# Patient Record
Sex: Male | Born: 1945
Health system: Southern US, Community
[De-identification: ages and names within clinical notes are randomized; demographics above are authoritative.]

## PROBLEM LIST (undated history)

## (undated) DIAGNOSIS — I2510 Atherosclerotic heart disease of native coronary artery without angina pectoris: Secondary | ICD-10-CM

## (undated) DIAGNOSIS — T7840XA Allergy, unspecified, initial encounter: Secondary | ICD-10-CM

## (undated) DIAGNOSIS — I4891 Unspecified atrial fibrillation: Secondary | ICD-10-CM

## (undated) DIAGNOSIS — I509 Heart failure, unspecified: Secondary | ICD-10-CM

## (undated) DIAGNOSIS — I25810 Atherosclerosis of coronary artery bypass graft(s) without angina pectoris: Secondary | ICD-10-CM

## (undated) DIAGNOSIS — K9189 Other postprocedural complications and disorders of digestive system: Secondary | ICD-10-CM

## (undated) DIAGNOSIS — M519 Unspecified thoracic, thoracolumbar and lumbosacral intervertebral disc disorder: Secondary | ICD-10-CM

## (undated) DIAGNOSIS — E785 Hyperlipidemia, unspecified: Secondary | ICD-10-CM

## (undated) DIAGNOSIS — R739 Hyperglycemia, unspecified: Secondary | ICD-10-CM

## (undated) DIAGNOSIS — K4090 Unilateral inguinal hernia, without obstruction or gangrene, not specified as recurrent: Secondary | ICD-10-CM

## (undated) DIAGNOSIS — K3533 Acute appendicitis with perforation and localized peritonitis, with abscess: Secondary | ICD-10-CM

## (undated) DIAGNOSIS — K567 Ileus, unspecified: Secondary | ICD-10-CM

## (undated) DIAGNOSIS — I10 Essential (primary) hypertension: Secondary | ICD-10-CM

## (undated) DIAGNOSIS — I209 Angina pectoris, unspecified: Secondary | ICD-10-CM

## (undated) DIAGNOSIS — I252 Old myocardial infarction: Secondary | ICD-10-CM

## (undated) DIAGNOSIS — I499 Cardiac arrhythmia, unspecified: Secondary | ICD-10-CM

## (undated) DIAGNOSIS — K651 Peritoneal abscess: Secondary | ICD-10-CM

## (undated) HISTORY — DX: Unilateral inguinal hernia, without obstruction or gangrene, not specified as recurrent: K40.90

## (undated) HISTORY — DX: Atherosclerotic heart disease of native coronary artery without angina pectoris: I25.10

## (undated) HISTORY — DX: Hyperglycemia, unspecified: R73.9

## (undated) HISTORY — PX: COLONOSCOPY: SHX174

## (undated) HISTORY — PX: CORONARY ANGIOPLASTY: SHX604

## (undated) HISTORY — PX: TONSILLECTOMY: SUR1361

## (undated) HISTORY — DX: Allergy, unspecified, initial encounter: T78.40XA

---

## 1997-12-07 ENCOUNTER — Observation Stay (HOSPITAL_COMMUNITY): Admission: AD | Admit: 1997-12-07 | Discharge: 1997-12-08 | Payer: Self-pay | Admitting: Cardiology

## 1999-08-08 HISTORY — PX: LUMBAR LAMINECTOMY: SHX95

## 2000-12-03 ENCOUNTER — Encounter: Payer: Self-pay | Admitting: Neurosurgery

## 2000-12-03 ENCOUNTER — Encounter: Admission: RE | Admit: 2000-12-03 | Discharge: 2000-12-03 | Payer: Self-pay | Admitting: Neurosurgery

## 2000-12-04 ENCOUNTER — Inpatient Hospital Stay (HOSPITAL_COMMUNITY): Admission: RE | Admit: 2000-12-04 | Discharge: 2000-12-06 | Payer: Self-pay | Admitting: Neurosurgery

## 2000-12-04 ENCOUNTER — Encounter: Payer: Self-pay | Admitting: Neurosurgery

## 2001-08-26 ENCOUNTER — Encounter: Payer: Self-pay | Admitting: Otolaryngology

## 2001-08-26 ENCOUNTER — Encounter: Admission: RE | Admit: 2001-08-26 | Discharge: 2001-08-26 | Payer: Self-pay | Admitting: Otolaryngology

## 2001-09-09 ENCOUNTER — Encounter: Payer: Self-pay | Admitting: Otolaryngology

## 2001-09-09 ENCOUNTER — Ambulatory Visit (HOSPITAL_COMMUNITY): Admission: RE | Admit: 2001-09-09 | Discharge: 2001-09-09 | Payer: Self-pay | Admitting: Otolaryngology

## 2002-08-07 DIAGNOSIS — I252 Old myocardial infarction: Secondary | ICD-10-CM

## 2002-08-07 HISTORY — DX: Old myocardial infarction: I25.2

## 2002-08-07 HISTORY — PX: CORONARY ARTERY BYPASS GRAFT: SHX141

## 2002-09-25 ENCOUNTER — Encounter: Payer: Self-pay | Admitting: Emergency Medicine

## 2002-09-25 ENCOUNTER — Inpatient Hospital Stay (HOSPITAL_COMMUNITY): Admission: EM | Admit: 2002-09-25 | Discharge: 2002-10-01 | Payer: Self-pay | Admitting: Emergency Medicine

## 2002-09-26 ENCOUNTER — Encounter: Payer: Self-pay | Admitting: Cardiothoracic Surgery

## 2002-09-27 ENCOUNTER — Encounter: Payer: Self-pay | Admitting: Thoracic Surgery (Cardiothoracic Vascular Surgery)

## 2002-09-28 ENCOUNTER — Encounter: Payer: Self-pay | Admitting: Thoracic Surgery (Cardiothoracic Vascular Surgery)

## 2002-09-29 ENCOUNTER — Encounter: Payer: Self-pay | Admitting: Thoracic Surgery (Cardiothoracic Vascular Surgery)

## 2002-10-13 ENCOUNTER — Encounter: Admission: RE | Admit: 2002-10-13 | Discharge: 2002-10-13 | Payer: Self-pay | Admitting: Cardiology

## 2002-10-13 ENCOUNTER — Encounter: Payer: Self-pay | Admitting: Cardiology

## 2002-10-30 ENCOUNTER — Encounter: Admission: RE | Admit: 2002-10-30 | Discharge: 2002-10-30 | Payer: Self-pay | Admitting: Cardiothoracic Surgery

## 2002-10-30 ENCOUNTER — Encounter: Payer: Self-pay | Admitting: Cardiothoracic Surgery

## 2005-03-08 ENCOUNTER — Ambulatory Visit (HOSPITAL_COMMUNITY): Admission: RE | Admit: 2005-03-08 | Discharge: 2005-03-08 | Payer: Self-pay | Admitting: Gastroenterology

## 2005-03-08 ENCOUNTER — Encounter (INDEPENDENT_AMBULATORY_CARE_PROVIDER_SITE_OTHER): Payer: Self-pay | Admitting: *Deleted

## 2009-03-22 ENCOUNTER — Encounter: Payer: Self-pay | Admitting: Family Medicine

## 2009-03-22 ENCOUNTER — Ambulatory Visit: Payer: Self-pay | Admitting: Family Medicine

## 2009-03-22 ENCOUNTER — Observation Stay (HOSPITAL_COMMUNITY): Admission: EM | Admit: 2009-03-22 | Discharge: 2009-03-22 | Payer: Self-pay | Admitting: Emergency Medicine

## 2010-10-31 ENCOUNTER — Other Ambulatory Visit: Payer: Self-pay | Admitting: Gastroenterology

## 2010-11-12 LAB — CBC
MCV: 91.6 fL (ref 78.0–100.0)
Platelets: 162 10*3/uL (ref 150–400)
RBC: 4.35 MIL/uL (ref 4.22–5.81)
WBC: 7.8 10*3/uL (ref 4.0–10.5)

## 2010-11-12 LAB — BASIC METABOLIC PANEL
Calcium: 9.9 mg/dL (ref 8.4–10.5)
GFR calc Af Amer: >60 mL/min (ref >60)
GFR calc non Af Amer: >60 mL/min (ref >60)
Potassium: 3.7 mEq/L (ref 3.5–5.1)
Sodium: 139 mEq/L (ref 135–145)

## 2010-11-12 LAB — POCT CARDIAC MARKERS
CKMB, poc: 1.5 ng/mL (ref 1.0–8.0)
Myoglobin, poc: 155 ng/mL (ref 12–200)
Troponin i, poc: <0.05 ng/mL (ref 0.00–0.09)
Troponin i, poc: <0.05 ng/mL (ref 0.00–0.09)

## 2010-11-12 LAB — CARDIAC PANEL(CRET KIN+CKTOT+MB+TROPI)
Relative Index: INVALID (ref 0.0–2.5)
Total CK: 94 U/L (ref 7–232)
Troponin I: 0.01 ng/mL (ref 0.00–0.06)

## 2010-11-12 LAB — POCT I-STAT, CHEM 8
Chloride: 110 mEq/L (ref 96–112)
Creatinine, Ser: 1.1 mg/dL (ref 0.4–1.5)
HCT: 39 % (ref 39.0–52.0)
Hemoglobin: 13.3 g/dL (ref 13.0–17.0)
Potassium: 3.7 mEq/L (ref 3.5–5.1)
Sodium: 141 mEq/L (ref 135–145)

## 2010-11-12 LAB — TSH: TSH: 2.656 u[IU]/mL (ref 0.350–4.500)

## 2010-11-12 LAB — DIFFERENTIAL
Basophils Relative: 1 % (ref 0–1)
Eosinophils Absolute: 0.3 10*3/uL (ref 0.0–0.7)
Lymphs Abs: 1.9 10*3/uL (ref 0.7–4.0)
Monocytes Relative: 8 % (ref 3–12)
Neutro Abs: 4.9 10*3/uL (ref 1.7–7.7)
Neutrophils Relative %: 63 % (ref 43–77)

## 2010-11-12 LAB — URINALYSIS, ROUTINE W REFLEX MICROSCOPIC
Bilirubin Urine: NEGATIVE
Ketones, ur: NEGATIVE mg/dL
Nitrite: NEGATIVE
Protein, ur: NEGATIVE mg/dL

## 2010-11-12 LAB — LIPID PANEL
Cholesterol: 104 mg/dL (ref 0–200)
HDL: 31 mg/dL — ABNORMAL LOW (ref >39)

## 2010-11-12 LAB — CK TOTAL AND CKMB (NOT AT ARMC): Relative Index: 1.8 (ref 0.0–2.5)

## 2010-12-20 NOTE — Discharge Summary (Signed)
NAME:  Perry [name], Perry [name]              ACCOUNT NO.:  [id number]   MEDICAL RECORD NO.:  [id number]           PATIENT TYPE:   LOCATION:                                 FACILITY:   PHYSICIAN:  Santiago Bumpers. Hensel, M.D.DATE OF BIRTH:  12-22-1945   DATE OF ADMISSION:  03/22/2009  DATE OF DISCHARGE:  03/22/2009                               DISCHARGE SUMMARY   PRIMARY CARE Raela Bohl:  Brett Canales A. Cleta Alberts, MD at [facility].   CARDIOLOGY:  Georga Hacking, MD, [place], [place].   DISCHARGE DIAGNOSES:  1. Atypical chest pain.  2. Coronary artery disease.  3. Suspected transient ischemic attack.   DISCHARGE MEDICATIONS:  1. Aspirin 81 mg daily.  2. Lisinopril 40 mg daily.  3. TriCor 145 mg daily.  4. Crestor 5 mg daily.  5. Toprol-XL 50 mg daily.   CONSULTANTS:  No formal consultants; however, Dr. Donnie Aho was aware of  his hospitalization.   PROCEDURES:  MRA/MRI of brain and carotid arteries, March 22, 2009,  echocardiogram, transthoracic on March 22, 2009.   LABORATORY DATA:  1. Admission CBC essentially was within normal limits.  Admission      basic metabolic panel essentially within normal limits.  Cardiac      enzymes negative x2 point of care and two quantitative labs (total      of 4).  2. Urinalysis essentially within normal limits.  3. Beta natriuretic peptide 64.  4. Lipid panel:  Total cholesterol 104, triglycerides 109, HDL 31, LDL      51, VLDL 22, and cholesterol to HDL ratio is 3.4.  5. TSH 0.454.   BRIEF HOSPITAL COURSE:  Mr. Finigan presented to the hospital after  experiencing diaphoresis and dyspnea following helping the son move into  college. He also experienced left arm numbness and tingling the night  following helping the son move into college.  He was concerned of a  stroke after typing the symptoms into [employer] and he presented to the ED  for evaluation.  He was evaluated for chest pain with an EKG and serial  cardiac enzymes that were normal.  The EKG showed old evidence of change  and multiple PVCs on telemetry, but no new ST elevation.  One EKG did  show bigeminy; however, no acute findings on further EKG.  He also  received a TIA workup with a MRI/MRA of the brain and neck, which  essentially was within normal limits.  Please see Radiology reports for  further details.  Echocardiograph showed an EF of 45% with mild aortic  valvular regurgitation and no embolism noted.  Please see report for  further details.  Of note, the echo showed mild hypokinesis of the  inferior myocardium and EF of 45-50%.  Mr. Goren tolerated his  hospitalization overnight very well with multiple PVCs on telemetry.  Essentially his cardiac and TIA workup were normal.  He was discharged  to home in stable medical condition.   DISCHARGE INSTRUCTIONS:  Given instructions for TIA and coronary artery  disease as well as normal instructions, he was instructed to use a low-  sodium  heart healthy diet as per normal and continue his home meds.   Pending the issues, results will need to be followed up after discharge.  1. LDL 51.  He is on a low-dose of Crestor, perhaps he could be      transitioned to a less potent statin such as simvastatin.  2. Aspirin.  Given his suspicion for TIA and history of coronary      artery disease, perhaps he could be on a higher dose aspirin;      however, we are leaving this to discretion of the primary care      Shanica Castellanos and Cardiology.   FOLLOWUP APPOINTMENTS:  1. Dr. Donnie Aho, Cardiology on March 23, 2009.  2. Dr. Cleta Alberts at [facility] on March 30, 2009, at 3:15 p.m.   DISCHARGE CONDITION:  The patient was discharged to home in stable  medical condition.      Clementeen Graham, MD  Electronically Signed      Santiago Bumpers. Leveda Anna, M.D.  Electronically Signed    EC/MEDQ  D:  03/22/2009  T:  03/23/2009  Job:  [id number]   cc:   Georga Hacking, M.D.  [place] Cleta Alberts, M.D.

## 2010-12-23 NOTE — Op Note (Signed)
NAME:  Perry [name], Perry [name]              ACCOUNT NO.:  [id number]   MEDICAL RECORD NO.:  [id number]          PATIENT TYPE:  AMB   LOCATION:  ENDO                         FACILITY:  [facility]   PHYSICIAN:  Bernette Redbird, M.D.   DATE OF BIRTH:  1946-03-27   DATE OF PROCEDURE:  03/08/2005  DATE OF DISCHARGE:                                 OPERATIVE REPORT   PROCEDURE:  Colonoscopy with biopsy.   INDICATIONS:  Initial colon cancer screening examination in a 65 year old  gentleman without worrisome risk factors or symptoms.   FINDINGS:  Two diminutive polyps removed by cold biopsy technique.   DESCRIPTION OF PROCEDURE:  The nature, purpose and risks of the procedure  had been reviewed with the patient through our open access program and I  reviewed the risks with him immediately prior to the procedure. The patient  had provided written consent and he received sedation with fentanyl 100 mcg  and Versed 10 mg IV prior to and during the course of the procedure without  arrhythmias other than some mild stable bradycardia related presumably to  his Toprol, and without clinical instability or desaturation during the  course of the procedure.   The Olympus adult video colonoscope was readily advanced to the cecum,  turning the patient into the supine position and applying some external  abdominal compression to reach the base of the cecum which was identified by  visualization of the appendiceal orifice. Pullback was then performed.  Although it was difficult to get a nice controlled motion of the scope back  and forth through the cecum and proximal ascending colon, I believe an  adequate exam of all areas in that section of the colon was obtained. As I  pulled back through the colon, the quality of the prep was excellent and it  is felt that all other areas were also well seen.   In the transverse colon, there were two small sessile hyperplastic-appearing  polyps, under 5 mm in diameter, removed by  cold biopsy. No large polyps,  cancer, colitis, vascular malformations or diverticulosis were noted in the  colon and retroflexion in the rectum and reinspection of the rectum were  unremarkable. The patient tolerated the procedure well and there no apparent  complications.   IMPRESSION:  Diminutive colon polyps removed as described above.   PLAN:  Await pathology results.       RB/MEDQ  D:  03/08/2005  T:  03/08/2005  Job:  [id number]   cc:   Brett Canales A. Cleta Alberts, M.D.  [address]  [place]  [place] [address]  Fax: [phone]

## 2010-12-23 NOTE — H&P (Signed)
NAME:  Perry [name], Perry [name]              ACCOUNT NO.:  [id number]   MEDICAL RECORD NO.:  [id number]          PATIENT TYPE:  OBV   LOCATION:  4731                         FACILITY:  [facility]   PHYSICIAN:  Nestor Ramp, MD        DATE OF BIRTH:  [birth year]   DATE OF ADMISSION:  03/21/2009  DATE OF DISCHARGE:  03/22/2009                              HISTORY & PHYSICAL   CHIEF COMPLAINT:  Left arm tingling.   HISTORY OF PRESENT ILLNESS:  The patient reports tingling in left arm at  9 p.m. around bedtime.  It woke the patient out of sleep a couple of  time and stopped at 1 a.m.  No radiation to jaw.  No chest pain as with  his past myocardial infarction.  No nausea.  The patient does report  shortness of breath as documented earlier on March 21, 2009, while  helping son move into college.  Rest resolved symptoms   PAST MEDICAL HISTORY:  CAD, status post PCI of LAD and right coronary  artery in 1999, myocardial infarction in 2004, angioplasty x5.  No  history of stroke.   PAST SURGICAL HISTORY:  Status post lumbar laminectomy or lumbar disk  disease.   FAMILY HISTORY:  Father had heart problems, first MI in his 79s.   SOCIAL HISTORY:  He lives with wife and daughter. He is a Scientist, research (physical sciences).  Former smoker, stopped 21 years ago.  No illegal drug use.  Drinks 2-3 shots of hard liquor at night.   ALLERGIES:  No known drug allergies.   MEDICATIONS:  1. Aspirin 81 mg one tablet p.o. daily.  2. Lisinopril 40 mg one tablet p.o. daily.  3. Charcoal 145 mg 1 tab p.o. daily.  4. Crestor 5 mg 1 tab p.o. daily.  5. Metoprolol ER 15 mg 1 tab p.o. daily.   PHYSICAL EXAMINATION:  VITAL SIGNS:  BP 132/70, heart rate 71,  respiratory rate 12, and O2 sat 100% on 2 L nasal cannula.  HEENT:  Head, normocephalic and atraumatic.  NECK:  No lymphadenopathy.  CARDIOVASCULAR:  Bradycardic rate.  RESPIRATORY:  Good respiratory effort, no crackles, wheezes, or rhonchi.  ABDOMEN:  Negative bowel sounds.  No  tenderness to palpation.  EXTREMITIES:  2+ bilateral radial pulses and dorsalis pedis pulses. FROM  x 4. Normal muscle strength grossly.  NEUROLOGIC:  Cranial nerves II through XII grossly intact.  A 5/5  bilateral lower and upper extremity strength.   LABORATORY DATA:  BUN 30.  Rest of BMET within normal limits and CBC  within normal limits.  Ionized calcium 1.19, POC cardiac enzymes x2  within normal limits.  CT without contrast, no abnormalities.  BNP  pending.  Urinalysis within normal limits.  EKG shows sinus rhythm,  borderline bradycardic rate, PVC is present.   ASSESSMENT AND PLAN:  A 65 year old male status post angioplasty x5,  percutaneous coronary intervention of left anterior descending and right  coronary artery and myocardial infarction history who presents withh  left arm tingling.  1. Parasthesia left arm.  Admit and rule out for MI by serial  enzymes,      EKG, telemetry. Consider MRA/MRI as part of TIA workup although      this is less likely etiology of his parasthesia.  2. Hypertension.  The patient is on lisinopril, we will continue.  3. Fluids, electrolytes, and nutrition/gastrointestinal.  Heart-      healthy diet.  4. Prophylaxis.  Heparin and Protonix.  5. Disposition.  Pending work up.      Perry Ivan, MD  Electronically Signed      Nestor Ramp, MD  Electronically Signed    VW/MEDQ  D:  03/23/2009  T:  03/24/2009  Job:  [id number]

## 2010-12-23 NOTE — Cardiovascular Report (Signed)
NAME:  Perry [name], Perry [name]                        ACCOUNT NO.:  [id number]   MEDICAL RECORD NO.:  [id number]                   PATIENT TYPE:  INP   LOCATION:  2924                                 FACILITY:  [facility]   PHYSICIAN:  Lesleigh Noe, M.D.            DATE OF BIRTH:  Dec 16, 1945   DATE OF PROCEDURE:  DATE OF DISCHARGE:                              CARDIAC CATHETERIZATION   CARDIAC CATHETERIZATION AND PCI NOTE:   INDICATIONS FOR PROCEDURE:  Acute inferior myocardial infarction.   PROCEDURE PERFORMED:  1. Left heart catheterization.  2. Selective coronary angiography.  3. Left ventriculography.  4. Angioplasty of the right coronary.   DESCRIPTION OF PROCEDURE:  After informed consent, the patient was brought  to the catheterization lab under emergency circumstances.  A 7-French sheath  was placed in the right femoral artery using the modified Seldinger  technique.  A 6-French AT multipurpose catheter was used for hemodynamic  recordings.  Left ventriculography by hand injection, right coronary  angiography and left coronary angiography.  A #4 left Judkins 6-French  catheter was used for left coronary angiography.  The patient tolerated the  diagnostic procedure without complications.   The diagnostic procedure identified a high-grade 99% stenosis in the mid  right coronary with TIMI grade 1-2 flow and ongoing chest pain.  We felt  that percutaneous intervention was indicated.  We upgraded to a 7-French JR-  4 guide catheter and a BMW wire.  The patient was already receiving  Integrelin and had received 2500 units of IV heparin before beginning the  procedure.  ACT was 188 seconds.  An additional 2500 units of heparin was  administered, and ACT was brought up into the 250 seconds range.  We then  performed percutaneous intervention.  We were unable to cross this stenosis  with a multitude of guidewires.  We were able to penetrate the lesion, but  were never able to get  free into the distal right coronary.  We used  hydrophilically-coated wires.  We used stiffer wires.  We used high-torque  floppy wires.  We also used balloon catheter as support vehicles, and also  changed guide catheters on multiple occasions, attempting to get better  guide support, but were unable to cross the stenosis in the right coronary  and get into a free distal position in the distal vessel.  We did perform  two balloon inflations in the region of the total occlusion over the  guidewire, but could not reestablish antegrade flow.  After approximately  two hours of the procedure, we halted our efforts and decided to have the  patient evaluated by surgery.  He was taken to the coronary care unit for  further observation and to await further discussion concerning surgery.   RESULTS:   I. HEMODYNAMIC DATA:  A.  Aortic pressure 103/73.   B.  Left ventricular pressure 103/9.   II. LEFT VENTRICULOGRAPHY:  A.  Mid inferior wall moderate hypokinesis, ejection fraction greater than  50%.   III. CORONARY ANGIOGRAPHY:  A.  Left main coronary:  The left main coronary artery is free of  significant high-grade obstruction.   B.  Left anterior descending coronary artery:  The distal LAD is ratty.  The proximal LAD contains 30-40% proximal narrowing in some views, and up to  50-60% in other views.  This is a proximal LAD stent.  The LAD in the mid  and distal vessel is ratty.  The second diagonal and first diagonal contains  60-70% narrowing.   C.  Circumflex artery:  The circumflex coronary artery is relatively large  and gives origin to a trifurcating obtuse marginal branch.  No significant  obstruction is noted.  Minimal distal collaterals to the right coronary are  noted.   D.  Right coronary artery:  There is 99%+ stenosis with TIMI grade 1-2 flow  into the distal vessel with ongoing chest pain.   PERCUTANEOUS CORONARY INTERVENTION:  As outlined above, we were unable to  cross  with wire.  We were able to partially penetrate the lesion, and to get  the wire into the distal third of the right coronary, but low pressure  balloon inflations did not reestablish flow.  The case was then terminated,  and consultation with CVTS was obtained.   CONCLUSIONS:  1. Severe coronary artery disease with subtotal occlusion of the RCA with     ongoing pain, diminished flow, and an acute infarction pattern.  2. Moderate LAD disease.  3. Overall preserved left ventricular function.  4. Failed attempt at angioplasty of the right coronary with resultant total     occlusion of the right coronary from a subtotal state.   PLAN:  CVTS evaluation for consideration of coronary artery bypass graft  versus conservative medical therapy.                                               Lesleigh Noe, M.D.    HWS/MEDQ  D:  09/26/2002  T:  09/26/2002  Job:  [id number]   cc:   Darden Palmer., M.D.  [address]  [place]  [place] [address]  Fax: [phone]   CVTS

## 2010-12-23 NOTE — H&P (Signed)
[place]. [facility]  Patient:    Perry [name], Perry [name]                     MRN: [id number] Adm. Date:  [id number] Attending:  Danella Penton                         History and Physical  Mr. Tally is a gentleman who in the past had been seen by Dr. Roxan Hockey because of left L2/L3 herniated disk.  The patient last time he was seen was over seven years ago.  He came to see me in the middle of March complaining of a three week history of back and left hip pain associated with weakness.  The patient states that the pain is getting worse.  He feels that the pain goes to the left leg, but mostly goes to the thigh and to the scrotum/testicle. Patient has had physical therapy.  He is not any better.  Conservative treatment was not offering any improvement.  He had an MRI and was sent to [place] for evaluation.  PAST MEDICAL HISTORY:  Angioplasty x 5.  ALLERGIES:  None.  FAMILY HISTORY:  Mother is 79 years old in good health.  Patients father died at the age of 37 with heart attack.  SOCIAL HISTORY:  Patient does not smoke.  He drinks socially.  REVIEW OF SYSTEMS:  Complaining of chest pain secondary to angina.  MEDICATIONS:  He is taking some pain medications and also medication for his heart.  PHYSICAL EXAMINATION  GENERAL:  Patient came to my office yesterday, although he was doing fairly well after I saw him in my office.  Nevertheless, yesterday came after he called me because he was unable to get out of the bed.  He was having a lot of pain down to the left leg.  He fell and the weakness was getting worse.  HEENT:  Normal.  NECK:  Normal.  LUNGS:  Clear.  HEART:  Sounds are normal with normal ______.  Normal pulse.  NEUROLOGIC:  Mental status normal.  Cranial nerves normal.  Strength is 5/5 except in the left leg.  I found that indeed he has weakness of the ______ 2/5.  The quadriceps are 4/5.  Distally he has good strength in the left  foot. Right side is negative.  Reflexes are symmetrical with decrease of the left quadriceps.  Femoral stretch maneuver is negative in the right, positive in the left side.  He is able to walk on tiptoes but is quite difficult.  There was a point where he was going to get off the examining room table and he almost fell because he lost control of the left leg.  LABORATORIES:  The MRI showed that indeed he has a herniated disk which is significant at the level 2-3 central and to the left side.  There is a bulging disk at the level 3-4.  CLINICAL IMPRESSION:  Left L2-3 herniated disk.  Incidental left 3-4 bulging disk.  RECOMMENDATION:  The patient wants to have surgery because he cannot live with the pain and the weakness.  The procedure will be left L2-3 discectomy. Ideally, we are going to go intraforaminal but there is a possibility we may need to go extraforaminal.  He knows about the risks of surgery, infection, ______, worsening pain, paralysis, and need for further surgery.  Also, the risk of ______ heart disease. DD:  12/04/00 TD:  12/04/00  Job: [id number]

## 2010-12-23 NOTE — Op Note (Signed)
[place]. [facility]  Patient:    Perry [name], Perry [name]                     MRN: [id number] Proc. Date: 12/04/00 Adm. Date:  [id number] Attending:  Danella Penton                           Operative Report  PREOPERATIVE DIAGNOSIS:  Left L2-3 herniated disk with _____ of the left quadriceps.  POSTOPERATIVE DIAGNOSIS:  Left L2-3 herniated disk with _____ of the left quadriceps.  PROCEDURES: 1. Left L2, L3 removal of two large free fragments. 2. Investigation of the L2-3 space. 3. Foraminotomy with decompression of the L3 nerve root. 4. Microscope.  SURGEON:  Tanya Nones. Jeral Fruit, M.D.  ASSISTANT:  Hewitt Shorts, M.D.  CLINICAL HISTORY:  Perry [name] is a gentleman who had been followed in the office for many years.  I saw him last month when he was complaining of some back and left leg pain.  Patient decided to go ahead with conservative treatment.  Nevertheless, last night he showed up in my office complaining of increase of the pain.  I found that he has 2/5 weakness of the left iliopsoas up to the point that when he tried to get off the examining room table, he almost fell because the left leg gave out on him.  We knew by x-ray that he has a herniated disk at the level of 2-3.  The patient wanted surgery as soon as possible.  The patient knew of the risks, such as described in the history and physical.  DESCRIPTION OF PROCEDURE:  The patient was taken to the operating room, and he was positioned in a prone manner.  X-ray was taken prior to surgery to open, and indeed the probe was at the level of L3.  From then on, we identified 2-3 without any problem.  We made the incision from L2 to L3, and we retracted the muscle laterally.  With the drill, we drilled the lower lamina of L2 and the upper of L3 and one-third of the medial facet.  We brought the microscope into the area, and the yellow ligament was removed.  Immediately what we found  was there was a large disk dorsolaterally.  Removal was done, and a piece of disk of approximately 12 x 20 mm was removed.  We were able to retract the L3 nerve root, and we found a second large fragment going into the foramen, as big as the first one.  From then on, we were able to retract laterally and medially the nerve without any problem.  The thecal sac was retracted medially, and we investigated the area of the disk space.  There was a rent in the annulus but no obvious opening.  We investigated above and below, and it was negative. Because of the small rent, we decided to coagulate the annulus.  We decided not to go ahead with a diskectomy.  Foraminotomy for the L3 nerve root was done.  Investigation of the L2 was negative.  From then on, the area was irrigated, hemostasis was done with bipolar.  Fentanyl and Depo-Medrol were left in the epidural space, and the wound was closed with Vicryl and a Steri-Strip.  The patient did well. DD:  12/04/00 TD:  12/05/00 Job: [id number]

## 2010-12-23 NOTE — Consult Note (Signed)
NAME:  Perry [name], Perry [name]                        ACCOUNT NO.:  [id number]   MEDICAL RECORD NO.:  [id number]                   PATIENT TYPE:  INP   LOCATION:  2311                                 FACILITY:  [facility]   PHYSICIAN:  Gwenith Daily. Tyrone Sage, M.D.            DATE OF BIRTH:  06-Dec-1945   DATE OF CONSULTATION:  09/26/2002  DATE OF DISCHARGE:                                   CONSULTATION   Consultation requested by Dr. Katrinka Blazing as emergency consult at 1:30 a.m.   HISTORY OF PRESENT ILLNESS:  The patient is a 65 year old male with known  coronary occlusive disease, having previous angioplasties of the right  coronary artery and the LAD in the past. He has had no intervention  recently. Approximately 8 p.m. the night of admission, he began having  substernal chest pain. He was admitted to the emergency room initially with  medical therapy. His pain improved but was persistent. He was seen by Dr.  Katrinka Blazing and urgent cardiac catheterization was performed. The patient  previously had had multiple stents placed in the right coronary artery and  the proximal LAD. Repeat cardiac catheterization by Dr. Katrinka Blazing revealed an  acute inferior myocardial infarction with initially a 99% proximal right  coronary lesion but was unable to be crossed by wire and ultimately ended up  being totally occluded and unable to pass the wire across it. In addition,  the patient had normal luminal irregularities in a large circumflex branch  but without any significant stenosis. The LAD had some views that appeared  at least 50 to 60%, if not 70%, just proximal to the old stent.   PAST MEDICAL HISTORY:  1. Lumbar disk disease.  2. As noted above, five previous PCA procedures.   FAMILY HISTORY:  Significant for his father died at age 77 of a myocardial  infarction, mother at 48. He has a positive family history of cardiac  disease. He is nonsmoker, does not use tobacco.   ALLERGIES:  None.   MEDICATIONS:  1.  Tricor.  2. Accupril.  3. Aspirin.  4. Cardizem.  5. In addition, he was given a double Integrilin bolus prior to the     catheterization lab.   REVIEW OF SYMPTOMS:  Unobtainable but was reviewed from the nurses notes on  the patient's initial admission.   PHYSICAL EXAMINATION:  GENERAL:  The patient was uncomfortable, complaining  of midsternal chest pain.  VITAL SIGNS:  Blood pressure 113/56, heart rate sinus rhythm at 70,  respiratory rate 20.  LUNGS:  His breath sounds were distant but loud active wheezing.  NECK:  He had no carotid bruits.  ABDOMINAL EXAM:  Moderately obese. Abdomen without palpable masses or  organomegaly.  EXTREMITIES:  Lower extremities appeared to have adequate pain from bypass.   SUMMARY:  Films were reviewed with Dr. Katrinka Blazing including the patient's old  films. It was decided although he had had pain for  significantly six to  eight hours with preserved LV function on the inferior wall and ongoing  chest pain that we should proceed with coronary artery bypass grafting  tonight. The OR team was called and setting up as we were proceeding toward  operation. The patient while in the CCU with myself and Dr. Katrinka Blazing in the  unit fibrillated. He was immediately shocked and had several further  episodes of VF. He was loaded with amiodarone, moved to the operating room  where prior to intubation he had one additional episode of VF and was  promptly shocked out of it, with each time return of awake and alertness and  conversant. Prior to the episodes of VF, coronary artery bypass grafting was  explained to the patient and his wife in detail. The risks of the procedure  including death, infection, stroke, myocardial infarction, and bleeding were  all discussed in detail. The patient was willing to proceed.                                               Gwenith Daily Tyrone Sage, M.D.    Tyson Babinski  D:  09/26/2002  T:  09/26/2002  Job:  [id number]

## 2010-12-23 NOTE — H&P (Signed)
NAME:  Perry [name], Perry [name]                        ACCOUNT NO.:  [id number]   MEDICAL RECORD NO.:  [id number]                   PATIENT TYPE:  INP   LOCATION:  1826                                 FACILITY:  [facility]   PHYSICIAN:  Lesleigh Noe, M.D.            DATE OF BIRTH:  23-Sep-1945   DATE OF ADMISSION:  09/25/2002  DATE OF DISCHARGE:                                HISTORY & PHYSICAL   REASON FOR ADMISSION:  Prolonged chest pain, acute coronary syndrome,  myocardial infarction.   HISTORY OF PRESENT ILLNESS:  This patient is 65 years of age and has a  history of coronary artery disease.  He first presented with an acute  coronary syndrome in 1988.  He underwent angioplasty on the right coronary  and had subsequently had three additional angioplasties in the right  coronaries.  He had an angioplasty and stent in the LAD territory.  The most  recent coronary interventions were in 1999 when repeat intervention was  performed on the right coronary.   This evening, after eating supper, he began having discomfort in his chest  that radiated into his neck, jaw, and into his left arm.  Three  nitroglycerin tablets would not relieve the discomfort and he came to the  emergency room.  By 10 p.m., with IV nitroglycerin and IV heparin going, he  continued to have discomfort and very subtle but definite evidence of  inferior injury on EKG with reciprocal T-wave changes in I and aVL.  Because  of this, it is felt that he is probably having an infarction, perhaps in a  relatively limited territory and acute coronary angiography is felt to be  indicated.   ALLERGIES:  None.   MEDICATIONS:  1. Tri-Chlor.  2. Accupril.  3. Aspirin.  4. Cardizem CD.   HABITS:  Discontinued smoking 15 years ago.  Drinks alcohol socially.   PAST MEDICAL HISTORY:  1. Coronary atherosclerotic heart disease with PCI of the LAD and right     coronary most recently as 1999.  2. Lumbar disc disease, status post  lumbar laminectomy.   FAMILY HISTORY:  Father died of myocardial infarction at age 56.  Two  brothers and a sister are free of any significant disease.  Mother is age  31.   REVIEW OF SYMPTOMS:  Basically unremarkable.   PHYSICAL EXAMINATION:  VITAL SIGNS:  Blood pressure 113/56 on IV  nitroglycerin.  Heart rate 70, respirations 16 and unlabored.  HEENT:  Unremarkable.  Extraocular movements are full.  NECK:  No JVD, carotid bruits, or thyromegaly.  LUNGS:  Clear to auscultation and percussion.  CARDIOVASCULAR:  No click, gallop, rub, and no murmur.  ABDOMEN:  Soft.  Liver and spleen are not palpable.  Bowel sounds are  normal.  EXTREMITIES:  No edema.  PULSES:  Pulses are 2+ and symmetrical in the upper and lower extremities.  NEUROLOGICAL:  Unremarkable.  Sensory exam  is unremarkable.   LABORATORY DATA:  EKG shows peak hyperacute T-wave changes in II, III, and  aVF and reciprocal T-wave abnormality in I and aVL.  Chest x-ray is  unremarkable.   Creatinine and BUN are normal.  Potassium is 3.9.   ASSESSMENT:  Probable acute inferior myocardial infarction.  Also in pain  since 8 p.m. and continued up to this point.   PLAN:  Urgent cardiac catheterization, IV heparin, IV Integrilin, and IV  nitroglycerin.  Pain on medication for control of symptoms.                                               Lesleigh Noe, M.D.    HWS/MEDQ  D:  09/25/2002  T:  09/25/2002  Job:  [id number]   cc:   Darden Palmer., M.D.  [address]  [place]  [place] [address]  Fax: [phone]

## 2010-12-23 NOTE — Discharge Summary (Signed)
NAME:  Perry [name], Perry [name]                        ACCOUNT NO.:  [id number]   MEDICAL RECORD NO.:  [id number]                   PATIENT TYPE:  INP   LOCATION:  2001                                 FACILITY:  [facility]   PHYSICIAN:  Gwenith Daily. Tyrone Sage, M.D.            DATE OF BIRTH:  1945-08-22   DATE OF ADMISSION:  09/25/2002  DATE OF DISCHARGE:  10/01/2002                                 DISCHARGE SUMMARY   ADMISSION DIAGNOSES:  Prolonged chest pain, acute inferior myocardial  infarction.   PAST MEDICAL HISTORY:  1. Coronary artery disease status post PCI of LAD and right coronary artery     in 1999. Cardiologist Dr. Viann Fish.  2. Lumbar disk disease, status post lumbar laminectomy.   ALLERGIES:  No known drug allergies.   DISCHARGE DIAGNOSES:  Two-vessel coronary artery disease with acute anterior  inferior wall myocardial infarction, status post coronary artery bypass  grafting.   BRIEF HISTORY:  Perry [name] is 65 year old Caucasian man. On the date of  admission in the evening of 2/20, he began having substernal chest pain and  was admitted to the [facility] emergency department. His pain improved some  with medical therapy; however, it did not recede. He was seen in  consultation by Dr. Katrinka Blazing who recommended proceeding with an urgent cardiac  catheterization.   HOSPITAL COURSE:  On 2/20, Perry [name] underwent a cardiac catheterization  with Dr. Verdis Prime which revealed severe coronary artery disease with  subtotal occlusion of the right coronary artery disease with ongoing pain,  diminished flow, and an acute infarction pattern. Overall preserved left  ventricular function estimated to be greater than 50%. As his lesions were  not amendable to PCI, cardiac surgery consult was requested. He was  evaluated by Dr. Sheliah Plane. After examination of the patient and  review of all the available records, it was Dr. Dennie Maizes opinion that the  most suitable course was to  proceed with an urgent coronary artery bypass  grafting. While preparations were being made in the operating room, [name] had multiple episodes of ventricular fibrillation with immediate  defibrillation by Dr. Tyrone Sage and Dr. Katrinka Blazing. Each time he awoke  neurologically intact.   Perry [name] was taken to the operating room where he underwent the following  surgical procedure: Emergent coronary artery bypass grafting x2. Placed at  the time of surgery:  Left internal mammary artery grafted to the left  anterior descending artery, saphenous vein graft to the posterior descending  artery. Vein was harvested from the right lower leg for the vein graft. He  tolerated this procedure well and was transferred in stable condition to the  SICU. He remained hemodynamically stable in the immediate postoperative  period and was extubated several hours after arrival in the intensive care  unit.   Perry [name] postoperative course has been uneventful. Because of his  preoperative VF, he was loaded with amiodarone IV.  This was changed to p.o.  dosing after surgery.   Perry [name] is making very good progress and recovered from his surgery. The  morning of 2/24, that is postoperative day #4, he reports feeling okay. His  vital signs are stable with blood pressure 110/67. He is afebrile. His room  air saturations are 92%. His heart is maintained in normal sinus rhythm. His  lungs are clear; however, he does still have some shortness of breath with  ambulation. He is tolerating his diet, and his bowel and function is within  normal limits. His incisions are all healing well. He has no lower extremity  edema. His pain is well controlled, but he has had several episodes of what  he feels to be anxiety. He is ambulating appropriately in the hallway. [name] is making very good progress. If Perry [name] continues on this  course, it is anticipated he will be ready for discharge home tomorrow,   10/01/02.   LABORATORY DATA:  CBC on 2/23 revealed a white blood cell count 8.2,  hemoglobin 8.5, hematocrit 24.3, platelets 174. Chemistries included sodium  of 138, potassium 4.1, BUN 40, creatinine 1.3, glucose 126, and calcium 8.8.   CONDITION ON DISCHARGE:  Improved.   MEDICATIONS:  1. Toprol-XL 25 mg one p.o. daily.  2. Amiodarone 200 mg p.o. daily.  3. Folic acid 1 mg p.o. daily.  4. Colace 200 mg p.o. daily.  5. Zocor 20 mg p.o. daily.   He has been instructed to resume the following home medications:  1. Accupril 10 mg p.o. daily.  2. Tricor 160 mg p.o. daily.  3. Enteric-coated aspirin 325 mg p.o. daily.   Pain management:  He may have Tylox one to two p.o. q.4-6h. p.r.n. for pain.   ACTIVITY:  He has been asked to refrain any driving or any heavy lifting,  pushing, or pulling. He has also been instructed to continue his breathing  exercises and daily walking.   WOUND CARE:  He may shower with mild soap and water. If his incisions become  red hot, swollen, draining, or if he has a fever greater than 101 degrees  Fahrenheit, he is to call Dr. Dennie Maizes office.   FOLLOW UP:  1. Dr. Donnie Aho would like to see him in the office in approximately two     weeks. He has been asked to call and     arrange that appointment.  2. Dr. Tyrone Sage would like to see him at the [facility] office on Thursday, 3/25,     at 11:20 in the morning. He has been asked to have a chest x-ray and a     CBC at 10:30 that morning.     Toribio Harbour, R.N.                  Gwenith Daily. Tyrone Sage, M.D.    CTK/MEDQ  D:  09/30/2002  T:  10/01/2002  Job:  [id number]   cc:   Lacretia Nicks. Ashley Royalty., M.D.  [address]  [place]  [place] [address]  Fax: [phone]   Lyn Records III, M.D.  [address]  [place]  [place] [address]  Fax: [phone]

## 2010-12-23 NOTE — Op Note (Signed)
NAME:  RESHARD, GUILLET NO.:  [id number]   MEDICAL RECORD NO.:  [id number]                   PATIENT TYPE:  INP   LOCATION:                                       FACILITY:  [facility]   PHYSICIAN:  Gwenith Daily. Tyrone Sage, M.D.            DATE OF BIRTH:  1946/04/19   DATE OF PROCEDURE:  09/26/2002  DATE OF DISCHARGE:                                 OPERATIVE REPORT   PREOPERATIVE DIAGNOSES:  Acute inferior myocardial infarction with VF  arrests preoperatively.   POSTOPERATIVE DIAGNOSES:  Acute inferior myocardial infarction with VF  arrests preoperatively.   OPERATION PERFORMED:  Emergency coronary artery bypass grafting times two  with left internal mammary artery to the left anterior descending coronary  artery and reversed saphenous vein graft to the posterior descending  coronary artery.   SURGEON:  Gwenith Daily. Tyrone Sage, M.D.   ASSISTANT:  Toribio Harbour, N.P..   ANESTHESIA:  General.   INDICATIONS FOR PROCEDURE:  The patient is a 65 year old male whose history  is outlined in a consult note but briefly, presented with acute myocardial  infarction after previous episodes of angioplasty and stenting of the right  coronary artery and the left anterior descending.  His films ultimately  showed total occlusion of the right coronary artery with ongoing chest pain  and several episodes ventricular fibrillation.  He was taken urgently to the  operating room and in the operating room had one additional episode of VF  requiring electrical cardioversion.  With this he regained hemodynamic  stability.   DESCRIPTION OF PROCEDURE:  The skin of the chest and legs was prepped with  Betadine and draped in the usual sterile manner.  A segment of vein was  harvested form the right lower extremity and was of good quality and  caliber.  A median sternotomy was performed.  The left internal mammary  artery was dissected down as a pedicle graft.  The distal artery was  divided  and had good free flow.  Because of the patient's relatively small LAD and  also because of his episodes of ventricular fibrillation, we decided not to  attempt offpump bypass. The patient was systemically heparinized.  The  ascending aorta and the right atrium were cannulated in the aortic root.  A  bent cardioplegia needle was introduced into the ascending aorta.  The  patient was placed on cardiopulmonary bypass at 2.4L per minute per meter  squared.  Sites for anastomosis were selected and dissected out of the  epicardium.  The patient's body temperature was cooled to 30 degrees.  An  aortic crossclamp was applied.  500 cc of cold blood potassium cardioplegia  was administered with rapid diastolic arrest of the heart.  Myocardial  septal temperature was monitored throughout the crossclamp period.   Attention was turned first to the distal right coronary artery which was  significantly thickened and diseased with a palpable stent  in place.  The  posterior descending coronary artery was opened and admitted a 1.5 mm probe.  Using a running 7-0 Prolene, distal anastomosis was performed with running 7-  0 Prolene.  Attention was then turned to the left anterior descending  coronary artery.  The proximal two thirds of this vessel was  intramyocardial.  Between the mid and distal third, the vessel was apparent  on the surface on the epicardium.  It was relatively small approximately 1.2  mm in size distally.  Using a running 8-0 Prolene, the left internal mammary  artery was anastomosed to the left anterior descending coronary artery.  With the rise in myocardial septal temperature, the aortic crossclamp was  removed.  With the crossclamp still in place, a single punch aortotomy was  created in the ascending aorta and the right coronary artery was trimmed  appropriate length and anastomosed to the ascending aorta.  Air was  evacuated from the ascending aorta and grasped and the  aortic crossclamp was  removed.  Total crossclamp time was 43 minutes.  The patient spontaneously  converted to a sinus rhythm.  The body temperature was rewarmed to 37  degrees.  He was started on a dopamine infusion, was continued on the  Cordarone infusion which was started preoperatively.  He was ventilated and  weaned from cardiopulmonary bypass without difficulty.  He remained  hemodynamically stable, was decannulated in the usual fashion.  Protamine  sulfate was administered.  The patient probably because of the Integrilin  and Heparin that had been administered preoperatively, continued to be  oozing after the protamine administration.  For this reason, platelet  transfusions were administered.  Two atrial and two ventricular pacing wires  were left.  Graft markers were applied. The pericardium was reapproximated.  The sternum was closed over two mediastinal tubes and a left pleural tube.  Several stainless steel wires were used to reapproximate the sternum.  Fascia closed with interrupted 0 Vicryl, running 3-0 Vicryl in the  subcutaneous tissues and 4-0 subcuticular stitch in the skin edges.  Dry  dressings were applied.  Sponge and needle counts were reported as correct  at the completion of the procedure.  The patient tolerated the procedure  without obvious complication and was transferred to the surgical intensive  care unit for further postoperative care.                                                 Gwenith Daily Tyrone Sage, M.D.    Tyson Babinski  D:  09/26/2002  T:  09/26/2002  Job:  [id number]   cc:   Lacretia Nicks. Ashley Royalty., M.D.  [address], [address]  [place]  [place] [address]  Fax: [phone]   Lyn Records III, M.D.  [address]  [address]  [place]  [place] [address]  Fax: [phone]

## 2011-06-15 ENCOUNTER — Encounter: Payer: Self-pay | Admitting: Emergency Medicine

## 2011-06-15 DIAGNOSIS — H9190 Unspecified hearing loss, unspecified ear: Secondary | ICD-10-CM | POA: Insufficient documentation

## 2011-06-15 DIAGNOSIS — R739 Hyperglycemia, unspecified: Secondary | ICD-10-CM | POA: Insufficient documentation

## 2011-06-15 DIAGNOSIS — K4090 Unilateral inguinal hernia, without obstruction or gangrene, not specified as recurrent: Secondary | ICD-10-CM | POA: Insufficient documentation

## 2011-06-15 DIAGNOSIS — E119 Type 2 diabetes mellitus without complications: Secondary | ICD-10-CM | POA: Insufficient documentation

## 2011-06-15 DIAGNOSIS — I2510 Atherosclerotic heart disease of native coronary artery without angina pectoris: Secondary | ICD-10-CM | POA: Insufficient documentation

## 2011-08-29 ENCOUNTER — Encounter (INDEPENDENT_AMBULATORY_CARE_PROVIDER_SITE_OTHER): Payer: BC Managed Care – PPO | Admitting: Emergency Medicine

## 2011-08-29 DIAGNOSIS — R300 Dysuria: Secondary | ICD-10-CM

## 2011-08-29 DIAGNOSIS — Z0000 Encounter for general adult medical examination without abnormal findings: Secondary | ICD-10-CM

## 2011-08-31 ENCOUNTER — Other Ambulatory Visit: Payer: Self-pay | Admitting: Emergency Medicine

## 2011-09-08 LAB — 5 HIAA, QUANTITATIVE, URINE, 24 HOUR: 5-HIAA, 24 Hr Urine: 5.4 mg/24 h (ref <=6.0)

## 2011-09-19 ENCOUNTER — Telehealth: Payer: Self-pay

## 2011-09-19 NOTE — Telephone Encounter (Signed)
Pt called back after receiving unable to reach letter about lab results (24 hr urine). Gave pt message from Dr Cleta Alberts that he is consulting with MD at [facility] and will call pt after that. Pt requests call with results when Dr Cleta Alberts is able at his cell # [phone].

## 2011-09-19 NOTE — Telephone Encounter (Signed)
I called patient and advised him his test results are okay. I spoke with the endocrinologist at [facility] regarding his 5 HIAA results. His test results are within normal range. No further followup is necessary regarding this issue.

## 2011-12-24 ENCOUNTER — Emergency Department (HOSPITAL_COMMUNITY)
Admission: EM | Admit: 2011-12-24 | Discharge: 2011-12-25 | Disposition: A | Discharge status: Home / Self Care | Payer: BC Managed Care – PPO | Attending: Emergency Medicine | Admitting: Emergency Medicine

## 2011-12-24 ENCOUNTER — Encounter (HOSPITAL_COMMUNITY): Payer: Self-pay | Admitting: Emergency Medicine

## 2011-12-24 DIAGNOSIS — Z79899 Other long term (current) drug therapy: Secondary | ICD-10-CM | POA: Insufficient documentation

## 2011-12-24 DIAGNOSIS — M7020 Olecranon bursitis, unspecified elbow: Secondary | ICD-10-CM

## 2011-12-24 DIAGNOSIS — I10 Essential (primary) hypertension: Secondary | ICD-10-CM | POA: Insufficient documentation

## 2011-12-24 DIAGNOSIS — H9190 Unspecified hearing loss, unspecified ear: Secondary | ICD-10-CM | POA: Insufficient documentation

## 2011-12-24 DIAGNOSIS — E119 Type 2 diabetes mellitus without complications: Secondary | ICD-10-CM | POA: Insufficient documentation

## 2011-12-24 DIAGNOSIS — I2510 Atherosclerotic heart disease of native coronary artery without angina pectoris: Secondary | ICD-10-CM | POA: Insufficient documentation

## 2011-12-24 DIAGNOSIS — Z951 Presence of aortocoronary bypass graft: Secondary | ICD-10-CM | POA: Insufficient documentation

## 2011-12-24 DIAGNOSIS — Z7982 Long term (current) use of aspirin: Secondary | ICD-10-CM | POA: Insufficient documentation

## 2011-12-24 HISTORY — DX: Essential (primary) hypertension: I10

## 2011-12-24 MED ORDER — CEFAZOLIN SODIUM 1-5 GM-% IV SOLN
1.0000 g | Freq: Once | INTRAVENOUS | Status: AC
  Administered 2011-12-24: 1 g via INTRAVENOUS
  Filled 2011-12-24: qty 50

## 2011-12-24 MED ORDER — CEPHALEXIN 500 MG PO CAPS: 500.0000 mg | ORAL_CAPSULE | Freq: Four times a day (QID) | ORAL | Status: AC

## 2011-12-24 NOTE — ED Provider Notes (Signed)
History     CSN: [id number]  Arrival date & time 12/24/11  2107   First MD Initiated Contact with Patient 12/24/11 2238      Chief Complaint  Patient presents with  . Elbow Pain    (Consider location/radiation/quality/duration/timing/severity/associated sxs/prior treatment) HPI Complains of left elbow pain and swelling onset 2 days ago. Pain is worse with palpation improved with rest no fever no other complaint no injury pain is nonradiating mild at present no treatment prior to coming here Past Medical History  Diagnosis Date  . CAD (coronary artery disease)   . 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 Hernia, inguinal     right  . Hyperglycemia   . Hearing loss     mild  . Diabetes mellitus   . Hypertension     Past Surgical History  Procedure Date  . Back surgery 2001    lumbar  . Coronary artery bypass graft 2003    2V  . Coronary angioplasty     multiple    No family history on file.  History  Substance Use Topics  . Smoking status: Former Games developer  . Smokeless tobacco: Not on file  . Alcohol Use: Yes      Review of Systems  Constitutional: Negative.   Respiratory: Negative.   Cardiovascular: Negative.   Musculoskeletal: Positive for arthralgias.       Left elbow pain    Allergies  Review of patient's allergies indicates no known allergies.  Home Medications   Current Outpatient Rx  Name Route Sig Dispense Refill  . ASPIRIN 81 MG PO CHEW Oral Chew 81 mg by mouth daily.    . FENOFIBRATE 145 MG PO TABS Oral Take 145 mg by mouth daily.      . OMEGA-3 FATTY ACIDS 1000 MG PO CAPS Oral Take 2 g by mouth daily.      Marland Kitchen LISINOPRIL 40 MG PO TABS Oral Take 40 mg by mouth daily.      Marland Kitchen METOPROLOL SUCCINATE ER 50 MG PO TB24 Oral Take 25 mg by mouth daily.     Marland Kitchen ROSUVASTATIN CALCIUM 5 MG PO TABS Oral Take 5 mg by mouth daily.        BP 136/82  Pulse 72  Temp(Src) 98.2 F (36.8 C) (Oral)  Resp 20  SpO2 96%  Physical Exam    Nursing note and vitals reviewed. Constitutional: He appears well-developed and well-nourished. He appears distressed.  HENT:  Head: Normocephalic and atraumatic.  Eyes: Conjunctivae and EOM are normal.  Neck: Neck supple.  Cardiovascular: Normal rate.   Pulmonary/Chest: Effort normal.  Abdominal: He exhibits no distension.  Musculoskeletal: Normal range of motion.       Left upper extremity minimally red warm swollen and tender over posterior elbow full range of motion neurovascularly intact. All other extremities atraumatic nontender neurovascularly intact    ED Course  Procedures (including critical care time)  Labs Reviewed - No data to display No results found.   No diagnosis found.    MDM  Exam and symptoms consistent with left olecranon bursitis Spoke with Dr. Shon Baton Plan Ancef 1 g IV prior to discharge Prescription Keflex Tylenol for pain Patient offered sling which he declines He is to see acute care clinic in [facility] orthopedics tomorrow Diagnosis  Left olecranon bursitis        Doug Sou, MD 12/24/11 [phone]

## 2011-12-24 NOTE — Discharge Instructions (Signed)
Bursitis Call [facility] orthopedics acute care clinic tomorrow morning at 8 AM at [phone] to arrange to be seen tomorrow afternoon. Take Tylenol as needed for pain Bursitis is a swelling and soreness (inflammation) of a fluid-filled sac (bursa) that overlies and protects a joint. It can be caused by injury, overuse of the joint, arthritis or infection. The joints most likely to be affected are the elbows, shoulders, hips and knees. HOME CARE INSTRUCTIONS   Apply ice to the affected area for 15 to 20 minutes each hour while awake for 2 days. Put the ice in a plastic bag and place a towel between the bag of ice and your skin.   Rest the injured joint as much as possible, but continue to put the joint through a full range of motion, 4 times per day. (The shoulder joint especially becomes rapidly "frozen" if not used.) When the pain lessens, begin normal slow movements and usual activities.   Only take over-the-counter or prescription medicines for pain, discomfort or fever as directed by your caregiver.   Your caregiver may recommend draining the bursa and injecting medicine into the bursa. This may help the healing process.   Follow all instructions for follow-up with your caregiver. This includes any orthopedic referrals, physical therapy and rehabilitation. Any delay in obtaining necessary care could result in a delay or failure of the bursitis to heal and chronic pain.  SEEK IMMEDIATE MEDICAL CARE IF:   Your pain increases even during treatment.   You develop an oral temperature above 102 F (38.9 C) and have heat and inflammation over the involved bursa.  MAKE SURE YOU:   Understand these instructions.   Will watch your condition.   Will get help right away if you are not doing well or get worse.  Document Released: 07/21/2000 Document Revised: 07/13/2011 Document Reviewed: 06/25/2009 [facility] Patient Information 2012 [place], [place].

## 2011-12-24 NOTE — ED Notes (Signed)
C/o pain and swelling to L elbow since yesterday.  No known injury.

## 2012-01-17 ENCOUNTER — Other Ambulatory Visit: Payer: Self-pay | Admitting: Emergency Medicine

## 2012-04-16 DIAGNOSIS — I10 Essential (primary) hypertension: Secondary | ICD-10-CM | POA: Diagnosis not present

## 2012-04-16 DIAGNOSIS — I2510 Atherosclerotic heart disease of native coronary artery without angina pectoris: Secondary | ICD-10-CM | POA: Diagnosis not present

## 2012-04-16 DIAGNOSIS — I252 Old myocardial infarction: Secondary | ICD-10-CM | POA: Diagnosis not present

## 2012-04-16 DIAGNOSIS — E669 Obesity, unspecified: Secondary | ICD-10-CM | POA: Diagnosis not present

## 2012-04-16 DIAGNOSIS — E785 Hyperlipidemia, unspecified: Secondary | ICD-10-CM | POA: Diagnosis not present

## 2012-04-16 DIAGNOSIS — I4949 Other premature depolarization: Secondary | ICD-10-CM | POA: Diagnosis not present

## 2012-05-08 ENCOUNTER — Other Ambulatory Visit: Payer: Self-pay | Admitting: Physician Assistant

## 2012-05-08 NOTE — Telephone Encounter (Signed)
Patients chart is at the nurses station in the pa pool pile.  [facility] [id number]

## 2012-05-09 ENCOUNTER — Other Ambulatory Visit: Payer: Self-pay

## 2012-05-09 MED ORDER — ALPRAZOLAM 0.5 MG PO TABS: ORAL_TABLET | ORAL | Status: DC

## 2012-05-09 NOTE — Telephone Encounter (Addendum)
Pharmacist called because they received a denial for pt's xanax RF w/reason "we haven't Rx'd before" and they do see where we had filled it in June. They wanted to make sure that we do not want to RF or if there is another reason? Checked w/Ryan and in chart and Ryan did OK 1 mos RF of Xanax. Called in Rx to [facility]

## 2012-05-09 NOTE — Telephone Encounter (Signed)
Chart pulled to PA pool at nurses station [phone]

## 2012-08-21 ENCOUNTER — Telehealth: Payer: Self-pay

## 2012-08-21 MED ORDER — ALPRAZOLAM 0.5 MG PO TABS: ORAL_TABLET | ORAL | Status: DC

## 2012-08-21 NOTE — Telephone Encounter (Signed)
Okay to refill Xanax 0.5  1 daily #30 with 5 refills

## 2012-08-21 NOTE — Telephone Encounter (Signed)
Okay to refill Xanax 0.5 one daily #30  5 refills

## 2012-08-21 NOTE — Telephone Encounter (Signed)
rx called into pharmacy

## 2012-08-21 NOTE — Telephone Encounter (Signed)
[employer] requesting xanax .05 for patient last filled 30 on 07/19/12

## 2012-10-24 DIAGNOSIS — E785 Hyperlipidemia, unspecified: Secondary | ICD-10-CM | POA: Diagnosis not present

## 2012-10-24 DIAGNOSIS — I2510 Atherosclerotic heart disease of native coronary artery without angina pectoris: Secondary | ICD-10-CM | POA: Diagnosis not present

## 2012-10-24 DIAGNOSIS — I10 Essential (primary) hypertension: Secondary | ICD-10-CM | POA: Diagnosis not present

## 2012-10-24 DIAGNOSIS — E669 Obesity, unspecified: Secondary | ICD-10-CM | POA: Diagnosis not present

## 2012-10-24 DIAGNOSIS — I252 Old myocardial infarction: Secondary | ICD-10-CM | POA: Diagnosis not present

## 2012-10-24 DIAGNOSIS — I4949 Other premature depolarization: Secondary | ICD-10-CM | POA: Diagnosis not present

## 2012-11-20 DIAGNOSIS — R42 Dizziness and giddiness: Secondary | ICD-10-CM | POA: Diagnosis not present

## 2012-11-20 DIAGNOSIS — I10 Essential (primary) hypertension: Secondary | ICD-10-CM | POA: Diagnosis not present

## 2012-11-20 DIAGNOSIS — R079 Chest pain, unspecified: Secondary | ICD-10-CM | POA: Diagnosis not present

## 2012-11-20 DIAGNOSIS — R0602 Shortness of breath: Secondary | ICD-10-CM | POA: Diagnosis not present

## 2012-11-20 DIAGNOSIS — I252 Old myocardial infarction: Secondary | ICD-10-CM | POA: Diagnosis not present

## 2012-11-20 DIAGNOSIS — I2510 Atherosclerotic heart disease of native coronary artery without angina pectoris: Secondary | ICD-10-CM | POA: Diagnosis not present

## 2013-03-19 DIAGNOSIS — I2510 Atherosclerotic heart disease of native coronary artery without angina pectoris: Secondary | ICD-10-CM | POA: Diagnosis not present

## 2013-03-19 DIAGNOSIS — R0609 Other forms of dyspnea: Secondary | ICD-10-CM | POA: Diagnosis not present

## 2013-03-19 DIAGNOSIS — R0989 Other specified symptoms and signs involving the circulatory and respiratory systems: Secondary | ICD-10-CM | POA: Diagnosis not present

## 2013-03-19 DIAGNOSIS — I4949 Other premature depolarization: Secondary | ICD-10-CM | POA: Diagnosis not present

## 2013-03-19 DIAGNOSIS — R61 Generalized hyperhidrosis: Secondary | ICD-10-CM | POA: Diagnosis not present

## 2013-03-19 DIAGNOSIS — I10 Essential (primary) hypertension: Secondary | ICD-10-CM | POA: Diagnosis not present

## 2013-03-27 DIAGNOSIS — I2510 Atherosclerotic heart disease of native coronary artery without angina pectoris: Secondary | ICD-10-CM | POA: Diagnosis not present

## 2013-03-27 DIAGNOSIS — I252 Old myocardial infarction: Secondary | ICD-10-CM | POA: Diagnosis not present

## 2013-03-27 DIAGNOSIS — E669 Obesity, unspecified: Secondary | ICD-10-CM | POA: Diagnosis not present

## 2013-03-27 DIAGNOSIS — I10 Essential (primary) hypertension: Secondary | ICD-10-CM | POA: Diagnosis not present

## 2013-03-27 DIAGNOSIS — I4949 Other premature depolarization: Secondary | ICD-10-CM | POA: Diagnosis not present

## 2013-03-27 DIAGNOSIS — R0602 Shortness of breath: Secondary | ICD-10-CM | POA: Diagnosis not present

## 2013-03-27 DIAGNOSIS — E785 Hyperlipidemia, unspecified: Secondary | ICD-10-CM | POA: Diagnosis not present

## 2013-04-04 ENCOUNTER — Ambulatory Visit
Admission: RE | Admit: 2013-04-04 | Discharge: 2013-04-04 | Disposition: A | Discharge status: Home / Self Care | Payer: BC Managed Care – PPO | Source: Ambulatory Visit | Attending: Cardiology | Admitting: Cardiology

## 2013-04-04 ENCOUNTER — Encounter: Payer: Self-pay | Admitting: Cardiology

## 2013-04-04 ENCOUNTER — Other Ambulatory Visit: Payer: Self-pay | Admitting: Cardiology

## 2013-04-04 DIAGNOSIS — E785 Hyperlipidemia, unspecified: Secondary | ICD-10-CM | POA: Diagnosis not present

## 2013-04-04 DIAGNOSIS — E669 Obesity, unspecified: Secondary | ICD-10-CM | POA: Diagnosis not present

## 2013-04-04 DIAGNOSIS — R0602 Shortness of breath: Secondary | ICD-10-CM

## 2013-04-04 DIAGNOSIS — I4949 Other premature depolarization: Secondary | ICD-10-CM | POA: Diagnosis not present

## 2013-04-04 DIAGNOSIS — I10 Essential (primary) hypertension: Secondary | ICD-10-CM | POA: Diagnosis not present

## 2013-04-04 DIAGNOSIS — I252 Old myocardial infarction: Secondary | ICD-10-CM | POA: Diagnosis not present

## 2013-04-04 DIAGNOSIS — I2510 Atherosclerotic heart disease of native coronary artery without angina pectoris: Secondary | ICD-10-CM | POA: Diagnosis not present

## 2013-04-04 NOTE — Progress Notes (Unsigned)
Patient ID: Perry [name], male   DOB: [birth year], 67 y.o.   MRN: [id number]   Perry [name]  Date of visit:  04/04/2013 DOB:  [birth year]    Age:  67 yrs. Medical record number:  [id number]     Account number:  [id number] Primary Care Provider: Lesle Chris [name] ____________________________ CURRENT DIAGNOSES  1. CAD,Native  2. Dyspnea  3. Hypertension-Essential (Benign)  4. Hyperlipidemia  5. MI-S/P Inferior  6. Surgery-Aortocoronary Bypass Grafting  7. Arrhythmia-PVC's(symptomatic)  8. Obesity(BMI30-40) ____________________________ ALLERGIES  Niacin, Rash ____________________________ MEDICATIONS  1. Omega 3 350-400 mg capsule, 2 qd  2. Tricor 145 mg tablet, 1 p.o. daily  3. lisinopril 40 mg tablet, 1 p.o. daily  4. nitroglycerin 0.4 mg tablet, sublingual, PRN  5. Fish Oil 1,000 mg capsule, 2 qd  6. Crestor 10 mg tablet, 1/2 tab daily  7. metoprolol succinate 50 mg tablet extended release 24 hr, 1/2 tab daily  8. aspirin 81 mg tablet,chewable, 1 p.o. daily ____________________________ CHIEF COMPLAINTS  Followup of dyspnea ____________________________ HISTORY OF PRESENT ILLNESS   Patient seen for cardiac evaluation. He was seen with worsening dyspnea that occurred after [name] business trip. He became short of breath and felt as if he had [name] virus and actually was driving his car and stopped across the street from the hospital. He continues to have some dyspnea with exertion and had [name] negative Myoview study done last week with no evidence of myocardial ischemia. Recently good exercise capacity. He has no angina and has no PND, orthopnea or edema. ____________________________ PAST HISTORY  Past Medical Illnesses:  hypertension, hyperlipidemia, obesity, lumbar disc disease;  Cardiovascular Illnesses:  CAD, S/P MI-inferior February 2004, arrhythmia-PVCs;  Surgical Procedures:  CABG with LIMA to LAD, SVG to PDA 2/04 Dr. Tyrone Sage, laminectomy lumbar;  Cardiology Procedures-Invasive:  cardiac  cath (left) February 2004, PTCA RCA 1988, PTCA RCA 1995, stent LAD 1998, stent RCA 1998;  Cardiology Procedures-Noninvasive:  treadmill cardiolite April 2009, treadmill cardiolite August 2013;  Cardiac Cath Results:  normal Left main, 50% stenosis proximal LAD, no significant disease CFX, occluded RCA;  LVEF of 44% documented via nuclear study on 03/27/2013,   ____________________________ CARDIO-PULMONARY TEST DATES EKG Date:  01/31/2011;   Cardiac Cath Date:  09/25/2002;  CABG: 09/26/2002;  Stent Placement Date: 12/07/1997;  Holter/Event Monitor Date: 12/08/2005;  Nuclear Study Date:  03/27/2013;  Echocardiography Date: 03/21/2009;  Chest Xray Date: 10/12/2002;   ____________________________ SOCIAL HISTORY Alcohol Use:  drinks occasionally;  Smoking:  used to smoke but quit 1989;  Diet:  regular diet without modifications;  Lifestyle:  married;  Exercise:  no regular exercise;  Occupation:  owns Press photographer;  Residence:  lives with wife and children;   ____________________________ REVIEW OF SYSTEMS General:  obesity Eyes: denies diplopia, history of glaucoma or visual problems. Respiratory: dyspnea with exertion Cardiovascular:  please review HPI Abdominal: denies dyspepsia,ulcers, GI bleeding, constipation, or diarrhea Musculoskeletal:  arthritis of the right shoulder  ____________________________ PHYSICAL EXAMINATION VITAL SIGNS  Blood Pressure:  148/80 Sitting, Left arm, regular cuff   Pulse:  76/min. Weight:  240.00 lbs. Height:  72"BMI: 32  Constitutional:  pleasant white male in no acute distress Skin:  warm and dry to touch, no apparent skin lesions, or masses noted. Head:  normocephalic, normal hair pattern, no masses or tenderness Chest:  clear to auscultation and percussion, healed median sternotomy scar Cardiac:  regular rhythm, normal S1 and S2, No S3 or S4, no murmurs, gallops or rubs detected. Extremities &  Back:  well healed saphenous vein donor site RLE, no edema  present Neurological:  no gross motor or sensory deficits noted, affect appropriate, oriented x3. ____________________________ MOST RECENT LIPID PANEL 06/10/12  CHOL TOTL 107 mg/dl, LDL 56 calc, HDL 29 mg/dl, TRIGLYCER 409 mg/dl and CHOL/HDL 3.7 (Calc) ____________________________ IMPRESSIONS/PLAN  1. Unexplained dyspnea 2. History of coronary artery disease with previous bypass grafting 3. Obesity  Recommendations:  Obtain chest x-ray and laboratory work as noted below. He has [name] negative myocardial perfusion scan with no evidence of ischemia. Advised to lose some weight and I will plan to see him in 6 months. If the dyspnea worsens or persists we will need to rethink this. ____________________________ TODAYS ORDERS  1. Comprehensive Metabolic Panel: Today  2. Complete Blood Count: Today  3. BNP: Today  4. TSH: Today  5. Chest X-ray PA/Lat: today  6. Return Visit: 6 months                       ____________________________ Cardiology Physician:  Darden Palmer MD [facility]

## 2013-05-12 DIAGNOSIS — Z23 Encounter for immunization: Secondary | ICD-10-CM | POA: Diagnosis not present

## 2013-05-15 ENCOUNTER — Encounter: Payer: Self-pay | Admitting: Emergency Medicine

## 2013-07-15 ENCOUNTER — Other Ambulatory Visit: Payer: Self-pay | Admitting: Radiology

## 2013-07-15 MED ORDER — ALPRAZOLAM 0.5 MG PO TABS: ORAL_TABLET | ORAL | Status: DC

## 2013-07-15 NOTE — Telephone Encounter (Signed)
Please advise on fax from [facility], alprazolam requested

## 2013-07-16 ENCOUNTER — Other Ambulatory Visit: Payer: Self-pay | Admitting: Radiology

## 2013-12-09 DIAGNOSIS — I10 Essential (primary) hypertension: Secondary | ICD-10-CM | POA: Diagnosis not present

## 2013-12-09 DIAGNOSIS — I252 Old myocardial infarction: Secondary | ICD-10-CM | POA: Diagnosis not present

## 2013-12-09 DIAGNOSIS — R0602 Shortness of breath: Secondary | ICD-10-CM | POA: Diagnosis not present

## 2013-12-09 DIAGNOSIS — E785 Hyperlipidemia, unspecified: Secondary | ICD-10-CM | POA: Diagnosis not present

## 2013-12-09 DIAGNOSIS — I4949 Other premature depolarization: Secondary | ICD-10-CM | POA: Diagnosis not present

## 2013-12-09 DIAGNOSIS — E669 Obesity, unspecified: Secondary | ICD-10-CM | POA: Diagnosis not present

## 2013-12-09 DIAGNOSIS — I2510 Atherosclerotic heart disease of native coronary artery without angina pectoris: Secondary | ICD-10-CM | POA: Diagnosis not present

## 2013-12-24 DIAGNOSIS — I2510 Atherosclerotic heart disease of native coronary artery without angina pectoris: Secondary | ICD-10-CM | POA: Diagnosis not present

## 2013-12-24 DIAGNOSIS — R0602 Shortness of breath: Secondary | ICD-10-CM | POA: Diagnosis not present

## 2014-01-22 ENCOUNTER — Ambulatory Visit: Payer: Medicare Other

## 2014-01-22 ENCOUNTER — Ambulatory Visit (INDEPENDENT_AMBULATORY_CARE_PROVIDER_SITE_OTHER): Payer: Medicare Other | Admitting: Emergency Medicine

## 2014-01-22 ENCOUNTER — Ambulatory Visit (INDEPENDENT_AMBULATORY_CARE_PROVIDER_SITE_OTHER): Payer: Medicare Other

## 2014-01-22 ENCOUNTER — Encounter: Payer: Self-pay | Admitting: Emergency Medicine

## 2014-01-22 ENCOUNTER — Other Ambulatory Visit: Payer: Self-pay | Admitting: Emergency Medicine

## 2014-01-22 VITALS — BP 128/72 | HR 42 | Temp 98.4°F | Resp 16 | Ht 71.5 in | Wt 240.0 lb

## 2014-01-22 DIAGNOSIS — I25810 Atherosclerosis of coronary artery bypass graft(s) without angina pectoris: Secondary | ICD-10-CM

## 2014-01-22 DIAGNOSIS — Z23 Encounter for immunization: Secondary | ICD-10-CM | POA: Diagnosis not present

## 2014-01-22 DIAGNOSIS — Z0000 Encounter for general adult medical examination without abnormal findings: Secondary | ICD-10-CM | POA: Diagnosis not present

## 2014-01-22 DIAGNOSIS — M25559 Pain in unspecified hip: Secondary | ICD-10-CM | POA: Diagnosis not present

## 2014-01-22 DIAGNOSIS — G479 Sleep disorder, unspecified: Secondary | ICD-10-CM

## 2014-01-22 DIAGNOSIS — R0602 Shortness of breath: Secondary | ICD-10-CM | POA: Diagnosis not present

## 2014-01-22 DIAGNOSIS — R5383 Other fatigue: Secondary | ICD-10-CM

## 2014-01-22 DIAGNOSIS — M25551 Pain in right hip: Secondary | ICD-10-CM

## 2014-01-22 DIAGNOSIS — Z125 Encounter for screening for malignant neoplasm of prostate: Secondary | ICD-10-CM | POA: Diagnosis not present

## 2014-01-22 DIAGNOSIS — R7309 Other abnormal glucose: Secondary | ICD-10-CM

## 2014-01-22 DIAGNOSIS — I10 Essential (primary) hypertension: Secondary | ICD-10-CM | POA: Diagnosis not present

## 2014-01-22 DIAGNOSIS — I25700 Atherosclerosis of coronary artery bypass graft(s), unspecified, with unstable angina pectoris: Secondary | ICD-10-CM

## 2014-01-22 DIAGNOSIS — R739 Hyperglycemia, unspecified: Secondary | ICD-10-CM

## 2014-01-22 DIAGNOSIS — E782 Mixed hyperlipidemia: Secondary | ICD-10-CM

## 2014-01-22 DIAGNOSIS — G4730 Sleep apnea, unspecified: Secondary | ICD-10-CM | POA: Insufficient documentation

## 2014-01-22 DIAGNOSIS — R5381 Other malaise: Secondary | ICD-10-CM | POA: Diagnosis not present

## 2014-01-22 DIAGNOSIS — I200 Unstable angina: Secondary | ICD-10-CM

## 2014-01-22 LAB — POCT URINALYSIS DIPSTICK
BILIRUBIN UA: NEGATIVE
GLUCOSE UA: NEGATIVE
KETONES UA: NEGATIVE
Leukocytes, UA: NEGATIVE
Nitrite, UA: NEGATIVE
Protein, UA: NEGATIVE
Urobilinogen, UA: 0.2
pH, UA: 5

## 2014-01-22 LAB — CBC WITH DIFFERENTIAL/PLATELET
Basophils Absolute: 0 10*3/uL (ref 0.0–0.1)
Basophils Relative: 0 % (ref 0–1)
EOS PCT: 3 % (ref 0–5)
Eosinophils Absolute: 0.2 10*3/uL (ref 0.0–0.7)
HEMATOCRIT: 41.4 % (ref 39.0–52.0)
Hemoglobin: 14.5 g/dL (ref 13.0–17.0)
LYMPHS ABS: 1.6 10*3/uL (ref 0.7–4.0)
LYMPHS PCT: 31 % (ref 12–46)
MCH: 31.7 pg (ref 26.0–34.0)
MCHC: 35 g/dL (ref 30.0–36.0)
MCV: 90.6 fL (ref 78.0–100.0)
MONO ABS: 0.5 10*3/uL (ref 0.1–1.0)
Monocytes Relative: 9 % (ref 3–12)
NEUTROS ABS: 3 10*3/uL (ref 1.7–7.7)
Neutrophils Relative %: 57 % (ref 43–77)
Platelets: 171 10*3/uL (ref 150–400)
RBC: 4.57 MIL/uL (ref 4.22–5.81)
RDW: 13.5 % (ref 11.5–15.5)
WBC: 5.2 10*3/uL (ref 4.0–10.5)

## 2014-01-22 LAB — POCT GLYCOSYLATED HEMOGLOBIN (HGB A1C): Hemoglobin A1C: 5.6

## 2014-01-22 LAB — TSH: TSH: 2.759 u[IU]/mL (ref 0.350–4.500)

## 2014-01-22 LAB — IFOBT (OCCULT BLOOD): IMMUNOLOGICAL FECAL OCCULT BLOOD TEST: POSITIVE

## 2014-01-22 MED ORDER — ZOSTER VACCINE LIVE 19400 UNT/0.65ML ~~LOC~~ SOLR: 0.6500 mL | Freq: Once | SUBCUTANEOUS | Status: DC

## 2014-01-22 MED ORDER — ALPRAZOLAM 0.5 MG PO TABS: ORAL_TABLET | ORAL | Status: DC

## 2014-01-22 NOTE — Progress Notes (Deleted)
   Subjective:    Patient ID: Perry [name], male    DOB: [birth year], 68 y.o.   MRN: [id number]  HPI    Review of Systems  Constitutional: Negative.   HENT: Negative.   Eyes: Positive for visual disturbance.  Respiratory: Positive for apnea and shortness of breath.   Cardiovascular: Negative.   Gastrointestinal: Negative.   Endocrine: Negative.   Genitourinary: Negative.   Musculoskeletal: Positive for back pain.  Skin: Negative.   Allergic/Immunologic: Negative.   Neurological: Negative.   Hematological: Negative.   Psychiatric/Behavioral: Positive for sleep disturbance.       Objective:   Physical Exam        Assessment & Plan:

## 2014-01-22 NOTE — Progress Notes (Addendum)
Subjective:   This chart was scribed for Darlyne Russian, MD by Forrestine Him, [facility] Scribe. This patient was seen in room 21 and the patient's care was started 8:18 AM.   Patient ID: Perry [name], male    DOB: October 15, 1945, 68 y.o.   MRN: [id number]  HPI  HPI Comments: Perry [name] is a 68 y.o. male with a PMHx of CAD, DM, and Hyperglycemia who presents to [facility] for a complete physical examination today.  He reports some intermittent, moderate pain to top of R iliac crest that has been ongoing for some time now. He denies any aggravating or alleviating factors. He also reports recent SOB with mild associated cough and has notified his Cardiologist regarding this concern. An Echocardiogram was performed without any abnormal findings. States this SOB is intermittent with exertion. He says SOB has some what dissipated overtime. Pt admits to some trouble sleeping. He is currently taking Xanax which he takes a few nights throughout the week. States with medication, he is able to fall asleep without difficulty but states he still wakes in the middle of the evening with mild SOB. States he sometimes has trouble getting back to sleep. 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 Pt potentially associates some of his symptoms to work stress. At this time he denies any fever, chills, rash, chest pain, or abdominal pain.  Pt states he follows with his Cardiologist, Dr. Wynonia Lawman every year.  Pt recently lost his Father in law and states his Wife's sisters husband past last night.  Patient Active Problem List   Diagnosis Date Noted   CAD (coronary artery disease)    Hernia, inguinal    Hyperglycemia    Hearing loss    Diabetes mellitus    Past Medical History  Diagnosis Date   CAD (coronary artery disease)    Exposure to toxic chemical     Chlordane  (organophosphate)   Hernia, inguinal     right   Hyperglycemia    Hearing loss     mild   Diabetes mellitus    Hypertension    Myocardial infarction    Past Surgical History  Procedure Laterality Date   Back surgery  2001    lumbar   Coronary artery bypass graft  2003    2V   Coronary angioplasty      multiple   Spine surgery     No Known Allergies Prior to Admission medications   Medication Sig Start Date End Date Taking? Authorizing Provider  ALPRAZolam Duanne Moron) 0.5 MG tablet TAKE ONE TABLET BY MOUTH AT BEDTIME. 07/15/13  Yes Darlyne Russian, MD  aspirin 81 MG chewable tablet Chew 81 mg by mouth daily.   Yes Historical Provider, MD  fenofibrate (TRICOR) 145 MG tablet Take 145 mg by mouth daily.     Yes Historical Provider, MD  fish oil-omega-3 fatty acids 1000 MG capsule Take 2 g by mouth daily.     Yes Historical Provider, MD  lisinopril (PRINIVIL,ZESTRIL) 40 MG tablet Take 40 mg by mouth daily.     Yes Historical Provider, MD  metoprolol (TOPROL-XL) 50 MG 24 hr tablet Take 25 mg by mouth daily.    Yes Historical Provider, MD  rosuvastatin (CRESTOR) 5 MG tablet Take 5 mg by mouth daily.     Yes Historical Provider, MD  History   Social History   Marital Status: Single    Spouse Name: N/A    Number of Children: N/A   Years of Education: N/A   Occupational History   Not on file.   Social History Main Topics   Smoking status: Former Smoker   Smokeless tobacco: Not on file   Alcohol Use: Yes   Drug Use: No   Sexual Activity: Not on file   Other Topics Concern   Not on file   Social History Narrative   No narrative on file     Review of Systems  Constitutional: Positive for fatigue. Negative for fever, chills, activity change, appetite change and unexpected weight change.  HENT: Negative for congestion.   Eyes: Negative for redness.  Respiratory: Positive for cough and shortness of breath.   Cardiovascular: Negative for chest pain and leg  swelling.  Gastrointestinal: Negative for abdominal pain.  Skin: Negative for rash.  Neurological: Negative for dizziness, light-headedness and headaches.  Psychiatric/Behavioral: Positive for sleep disturbance. Negative for confusion.     Objective:  Physical Exam  CONSTITUTIONAL: Well developed/well nourished HEAD: Normocephalic/atraumatic EYES: EOMI/PERRL ENMT: Mucous membranes moist NECK: supple no meningeal signs SPINE:entire spine nontender CV: Slow rate , irregular rhythm with pauses; Midline sternotomy scar secondary to previous bypass surgery; No carotid bruit LUNGS: Lungs are clear to auscultation bilaterally, no apparent distress ABDOMEN: soft, nontender, no rebound or guarding GU:no cva tenderness NEURO: Pt is awake/alert, moves all extremitiesx4 EXTREMITIES: pulses normal, full ROM SKIN: warm, color normal PSYCH: no abnormalities of mood noted  EKG previous inferior MI no acute changes frequent PVCs with trigeminy Triage Vitals: BP 128/72   Pulse 42   Temp(Src) 98.4 F (36.9 C) (Oral)   Resp 16   Ht 5' 11.5" (1.816 m)   Wt 240 lb (108.863 kg)   BMI 33.01 kg/m2   SpO2 98%  Pulmonary function test was normal [facility] reading (PRIMARY) by  Dr.Daub chest x-ray showed no change from previous. Films of the right hip are unremarkable.  Assessment & Plan:  Will schedule sleep study. No changes in medications at the present time. I think the recorded rate of 40 was slow secondary to the pulse ox not picking up the PVCs. He has trigeminy. Underlying heart rate 66. Chest x-ray was no change. He will be scheduled for sleep study. Patient is to work on weight loss and exercise. I personally performed the services described in this documentation, which was scribed in my presence. The recorded information has been reviewed and is accurate.

## 2014-01-23 ENCOUNTER — Telehealth: Payer: Self-pay | Admitting: *Deleted

## 2014-01-23 LAB — PSA, MEDICARE: PSA: 1.77 ng/mL (ref <=4.00)

## 2014-01-23 NOTE — Telephone Encounter (Signed)
Faxed ECG to Dr Wynonia Lawman, per Dr Everlene Farrier.

## 2014-01-27 ENCOUNTER — Telehealth: Payer: Self-pay

## 2014-01-27 LAB — LIPID PANEL
CHOL/HDL RATIO: 3 ratio
Cholesterol: 111 mg/dL (ref 0–200)
HDL: 37 mg/dL — ABNORMAL LOW (ref >39)
LDL CALC: 56 mg/dL (ref 0–99)
Triglycerides: 88 mg/dL (ref <150)
VLDL: 18 mg/dL (ref 0–40)

## 2014-01-27 NOTE — Telephone Encounter (Signed)
Patient called and states he is returning a missed call. Please return call and advise.

## 2014-01-28 ENCOUNTER — Encounter: Payer: Self-pay | Admitting: Neurology

## 2014-01-28 ENCOUNTER — Telehealth: Payer: Self-pay

## 2014-01-28 ENCOUNTER — Ambulatory Visit (INDEPENDENT_AMBULATORY_CARE_PROVIDER_SITE_OTHER): Payer: Medicare Other | Admitting: Neurology

## 2014-01-28 VITALS — BP 144/80 | HR 42 | Resp 16 | Ht 73.0 in | Wt 248.0 lb

## 2014-01-28 DIAGNOSIS — I25810 Atherosclerosis of coronary artery bypass graft(s) without angina pectoris: Secondary | ICD-10-CM | POA: Diagnosis not present

## 2014-01-28 DIAGNOSIS — R5383 Other fatigue: Secondary | ICD-10-CM | POA: Diagnosis not present

## 2014-01-28 DIAGNOSIS — G4710 Hypersomnia, unspecified: Secondary | ICD-10-CM

## 2014-01-28 DIAGNOSIS — R5381 Other malaise: Secondary | ICD-10-CM

## 2014-01-28 DIAGNOSIS — G4719 Other hypersomnia: Secondary | ICD-10-CM

## 2014-01-28 NOTE — Telephone Encounter (Signed)
Pt says he is returning a call. He knows it is regarding his lab but wants to speak with someone clinical about lab work. He has a sleep study from 1-2 today so can you call around those hours.

## 2014-01-28 NOTE — Progress Notes (Signed)
[facility] Perry [name]  Provider:  Larey Seat, [place] D  Referring Provider: Darlyne Russian, MD Primary Care Physician:  Jenny Reichmann, MD  Chief Complaint  Patient presents with  . New Evaluation    Room 11  . Sleep consult    HPI:  Perry [name] is a 68 y.o.  caucasain, married , right handed male , who is seen here as a referral from Dr. Everlene Farrier for a sleep medicine consultation,   Mr. Damron reports having a high degree of daytime fatigue and sleepiness, and recently having woken up from choking for air. He works as a Clinical biochemist.  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  He has not felt rested or restored for a while. Dr Everlene Farrier has given him Xanax , and this helps to immediately go to sleep, but not to stay asleep.   He comes home after 12 - 14 hours of work and thrives on problem solving. He comes home by 6.30 , eats "late' at 7.30, goes to be at 8.45 PM , he will sleep within 30 minutes. Rises at 4 o'clock. Lately he needs an alarm, he hits the snooze button- feels too tired. He works all week end on the farm, usually by himself. The day routine between week days and week ends is similar.  He wakes up at night. No nocturia. No pain, no dreams- just waking up at 12.00 at 2 Am and 3 AM . He sleeps on the side or prone, he sleeps on one pillow. His wife doesn't report him snoring. He has had isolated sleep choking spells , suffocation sensation, 5 times over the last 3 month, never before.  He seems to have early morning wakening. He feels he doesn't get enough sleep, only 5-6 hours . He doesn't leave the bed usually, rarely does he get a glas of milk.  He usually drinks a cup of coffee on the way to work, eats breakfast at work 7 AM, lunch time 12 o'clock, takes a 10 minutes power nap - and feels refreshed. Sometimes takes his nap already at 9.30.    His medical history is that of  CAD, angioplasty ( Dr Wynonia Lawman) , Bypass surgery, spinal surgery.     Review of Systems: Out of a complete 14 system review, the patient complains of only the following symptoms, and all other reviewed systems are negative. GDS at 2 Points, Epworth at 9 points,  FSS at 38 points.     History   Social History  . Marital Status: Married    Spouse Name: Perry [name]    Number of Children: 2  . Years of Education: Masters   Occupational History  . Not on file.   Social History Main Topics  . Smoking status: Former Research scientist (life sciences)  . Smokeless tobacco: Former Systems developer  . Alcohol Use: Yes     Comment: 2 drinks  . Drug Use: No  . Sexual Activity: Not on file   Other Topics Concern  . Not on file   Social History Narrative   Patient is married Perry [name]) and lives at home with his wife.   Patient has two adult children.   Patient is working full-time.   Patient has a Master's degree.   Patient drinks one cup of coffee every morning.   Patient is right-handed.    Family History  Problem Relation Age of Onset  .  Heart disease Father   . Heart disease Brother   . Cancer Brother     Past Medical History  Diagnosis Date  . CAD (coronary artery disease)   . 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 Hernia, inguinal     right  . Hyperglycemia   . Hearing loss     mild  . Diabetes mellitus   . Hypertension   . Myocardial infarction     Past Surgical History  Procedure Laterality Date  . Back surgery  2001    lumbar  . Coronary artery bypass graft  2003    2V  . Coronary angioplasty      multiple  . Spine surgery      Current Outpatient Prescriptions  Medication Sig Dispense Refill  . ALPRAZolam (XANAX) 0.5 MG tablet TAKE ONE TABLET BY MOUTH AT BEDTIME.  30 tablet  5  . aspirin 81 MG chewable tablet Chew 81 mg by mouth daily.      . fenofibrate (TRICOR) 145 MG tablet Take 145 mg by mouth daily.        . fish oil-omega-3 fatty acids 1000 MG capsule Take 2 g by  mouth daily.        Marland Kitchen lisinopril (PRINIVIL,ZESTRIL) 40 MG tablet Take 40 mg by mouth daily.        . metoprolol (TOPROL-XL) 50 MG 24 hr tablet Take 25 mg by mouth daily.       . rosuvastatin (CRESTOR) 5 MG tablet Take 5 mg by mouth daily.        Marland Kitchen zoster vaccine live, PF, (ZOSTAVAX) 46270 UNT/0.65ML injection Inject 19,400 Units into the skin once.  1 each  0   No current facility-administered medications for this visit.    Allergies as of 01/28/2014  . (No Known Allergies)    Vitals: BP 144/80  Pulse 42  Resp 16  Ht 6\' 1"  (1.854 m)  Wt 248 lb (112.492 kg)  BMI 32.73 kg/m2 Last Weight:  Wt Readings from Last 1 Encounters:  01/28/14 248 lb (112.492 kg)   Last Height:   Ht Readings from Last 1 Encounters:  01/28/14 6\' 1"  (1.854 m)    Physical exam:  General: The patient is awake, alert and appears not in acute distress. The patient is well groomed. Head: Normocephalic, atraumatic. Neck is supple. Mallampati 3  neck circumference:17.5 , mild retrognathia.  Cardiovascular:  Regular rate and rhythm, without  murmurs or carotid bruit, and without distended neck veins. Respiratory: Lungs are clear to auscultation. Skin:  Without evidence of edema, or rash Trunk: BMI is  elevated and patient  has normal posture.  Neurologic exam : The patient is awake and alert, oriented to place and time.  Memory subjective  described as intact.  There is a normal attention span & concentration ability. Speech is fluent without dysarthria, dysphonia or aphasia. Mood and affect are appropriate.  Cranial nerves: Pupils are equal and briskly reactive to light. Funduscopic exam without  evidence of pallor or edema. Extraocular movements  in vertical and horizontal planes intact and without nystagmus. Visual fields by finger perimetry are intact. Hearing to finger rub intact.  Facial sensation intact to fine touch. Facial motor strength is symmetric and tongue and uvula move midline.  Motor exam:    Normal tone , muscle bulk and symmetric strength in all extremities.  Sensory:  Fine touch, pinprick and vibration were tested in all extremities. Proprioception isnormal.  Coordination: Rapid alternating  movements in the fingers/hands is tested and normal. Finger-to-nose maneuver tested and normal without evidence of ataxia, dysmetria or tremor.  Gait and station: Patient walks without assistive device . Deep tendon reflexes: in the  upper and lower extremities are symmetric and intact, attenuated achilles tendon reflexes. Babinski maneuver response is downgoing.   Assessment:  After physical and neurologic examination, review of laboratory studies, imaging, neurophysiology testing and pre-existing records, assessment is  1) Moderate risk for sleep apnea , as a possible cause for insomia.   Patient has daylight exposure .     He does not snore, no reports of REM BD and no HTN.   Plan:  Treatment plan and additional workup : SPLIT study ordered.

## 2014-01-28 NOTE — Telephone Encounter (Signed)
See labs 

## 2014-01-29 NOTE — Telephone Encounter (Signed)
Perry [name] spoke with pt, per note.  Pt is aware.

## 2014-03-12 ENCOUNTER — Ambulatory Visit (INDEPENDENT_AMBULATORY_CARE_PROVIDER_SITE_OTHER): Payer: Medicare Other | Admitting: Neurology

## 2014-03-12 ENCOUNTER — Encounter: Payer: Self-pay | Admitting: Neurology

## 2014-03-12 DIAGNOSIS — R5383 Other fatigue: Secondary | ICD-10-CM

## 2014-03-12 DIAGNOSIS — G4733 Obstructive sleep apnea (adult) (pediatric): Secondary | ICD-10-CM | POA: Diagnosis not present

## 2014-03-12 DIAGNOSIS — G4719 Other hypersomnia: Secondary | ICD-10-CM

## 2014-03-12 DIAGNOSIS — R5381 Other malaise: Secondary | ICD-10-CM

## 2014-03-22 ENCOUNTER — Other Ambulatory Visit: Payer: Self-pay | Admitting: Emergency Medicine

## 2014-04-02 ENCOUNTER — Telehealth: Payer: Self-pay | Admitting: Neurology

## 2014-04-02 ENCOUNTER — Encounter: Payer: Self-pay | Admitting: Neurology

## 2014-04-02 ENCOUNTER — Other Ambulatory Visit: Payer: Self-pay | Admitting: Neurology

## 2014-04-02 DIAGNOSIS — G4733 Obstructive sleep apnea (adult) (pediatric): Secondary | ICD-10-CM

## 2014-04-02 NOTE — Telephone Encounter (Signed)
Patient called to review results of sleep study.  He understands the diagnosis of obstructive sleep apnea and he is aware that an order has been submitted to [facility] to begin CPAP therapy.  A copy of his sleep study will be sent to Dr. Arlyss Queen and he will receive the sleep study test results report via mail.

## 2014-04-03 ENCOUNTER — Telehealth: Payer: Self-pay | Admitting: Neurology

## 2014-04-03 NOTE — Telephone Encounter (Signed)
I left VM : Xanax can have made it worse, but wasn't the reason for apnea to begin with. Xanax wil suppress some of the breathing reflexes, and will worsen existing respiratory conditions.  CD

## 2014-04-03 NOTE — Telephone Encounter (Signed)
The patient would like to know if his diagnosis of severe osa and obesity hypoventilation was due to his recent use of xanax.  Please advise, thanks!

## 2014-04-07 DIAGNOSIS — I4891 Unspecified atrial fibrillation: Secondary | ICD-10-CM

## 2014-04-07 HISTORY — DX: Unspecified atrial fibrillation: I48.91

## 2014-04-16 ENCOUNTER — Other Ambulatory Visit: Payer: Self-pay | Admitting: Radiology

## 2014-04-16 DIAGNOSIS — R040 Epistaxis: Secondary | ICD-10-CM

## 2014-04-20 ENCOUNTER — Inpatient Hospital Stay (HOSPITAL_COMMUNITY)
Admission: EM | Admit: 2014-04-20 | Discharge: 2014-05-04 | DRG: 339 | Disposition: A | Discharge status: Home Health | Payer: Medicare Other | Attending: General Surgery | Admitting: General Surgery

## 2014-04-20 DIAGNOSIS — Z79899 Other long term (current) drug therapy: Secondary | ICD-10-CM | POA: Diagnosis not present

## 2014-04-20 DIAGNOSIS — K651 Peritoneal abscess: Secondary | ICD-10-CM | POA: Diagnosis not present

## 2014-04-20 DIAGNOSIS — IMO0001 Reserved for inherently not codable concepts without codable children: Secondary | ICD-10-CM

## 2014-04-20 DIAGNOSIS — I10 Essential (primary) hypertension: Secondary | ICD-10-CM | POA: Diagnosis present

## 2014-04-20 DIAGNOSIS — I4891 Unspecified atrial fibrillation: Secondary | ICD-10-CM | POA: Diagnosis not present

## 2014-04-20 DIAGNOSIS — K3580 Unspecified acute appendicitis: Secondary | ICD-10-CM

## 2014-04-20 DIAGNOSIS — E119 Type 2 diabetes mellitus without complications: Secondary | ICD-10-CM | POA: Diagnosis present

## 2014-04-20 DIAGNOSIS — Y9210 Unspecified residential institution as the place of occurrence of the external cause: Secondary | ICD-10-CM | POA: Diagnosis not present

## 2014-04-20 DIAGNOSIS — I2510 Atherosclerotic heart disease of native coronary artery without angina pectoris: Secondary | ICD-10-CM | POA: Diagnosis present

## 2014-04-20 DIAGNOSIS — R141 Gas pain: Secondary | ICD-10-CM | POA: Diagnosis not present

## 2014-04-20 DIAGNOSIS — Y849 Medical procedure, unspecified as the cause of abnormal reaction of the patient, or of later complication, without mention of misadventure at the time of the procedure: Secondary | ICD-10-CM | POA: Diagnosis not present

## 2014-04-20 DIAGNOSIS — Z452 Encounter for adjustment and management of vascular access device: Secondary | ICD-10-CM | POA: Diagnosis not present

## 2014-04-20 DIAGNOSIS — K929 Disease of digestive system, unspecified: Secondary | ICD-10-CM | POA: Diagnosis not present

## 2014-04-20 DIAGNOSIS — E669 Obesity, unspecified: Secondary | ICD-10-CM | POA: Diagnosis present

## 2014-04-20 DIAGNOSIS — K389 Disease of appendix, unspecified: Secondary | ICD-10-CM | POA: Diagnosis not present

## 2014-04-20 DIAGNOSIS — Z951 Presence of aortocoronary bypass graft: Secondary | ICD-10-CM | POA: Diagnosis not present

## 2014-04-20 DIAGNOSIS — K9189 Other postprocedural complications and disorders of digestive system: Secondary | ICD-10-CM

## 2014-04-20 DIAGNOSIS — K7689 Other specified diseases of liver: Secondary | ICD-10-CM | POA: Diagnosis not present

## 2014-04-20 DIAGNOSIS — E870 Hyperosmolality and hypernatremia: Secondary | ICD-10-CM | POA: Diagnosis not present

## 2014-04-20 DIAGNOSIS — Z4682 Encounter for fitting and adjustment of non-vascular catheter: Secondary | ICD-10-CM | POA: Diagnosis not present

## 2014-04-20 DIAGNOSIS — Z87891 Personal history of nicotine dependence: Secondary | ICD-10-CM | POA: Diagnosis not present

## 2014-04-20 DIAGNOSIS — K3533 Acute appendicitis with perforation and localized peritonitis, with abscess: Secondary | ICD-10-CM | POA: Diagnosis not present

## 2014-04-20 DIAGNOSIS — T8140XA Infection following a procedure, unspecified, initial encounter: Secondary | ICD-10-CM | POA: Diagnosis not present

## 2014-04-20 DIAGNOSIS — Z0389 Encounter for observation for other suspected diseases and conditions ruled out: Secondary | ICD-10-CM | POA: Diagnosis not present

## 2014-04-20 DIAGNOSIS — M7989 Other specified soft tissue disorders: Secondary | ICD-10-CM | POA: Diagnosis not present

## 2014-04-20 DIAGNOSIS — E785 Hyperlipidemia, unspecified: Secondary | ICD-10-CM | POA: Diagnosis present

## 2014-04-20 DIAGNOSIS — E876 Hypokalemia: Secondary | ICD-10-CM | POA: Diagnosis not present

## 2014-04-20 DIAGNOSIS — K560 Paralytic ileus: Secondary | ICD-10-CM | POA: Diagnosis not present

## 2014-04-20 DIAGNOSIS — Z7982 Long term (current) use of aspirin: Secondary | ICD-10-CM

## 2014-04-20 DIAGNOSIS — R188 Other ascites: Secondary | ICD-10-CM | POA: Diagnosis not present

## 2014-04-20 DIAGNOSIS — T814XXD Infection following a procedure, subsequent encounter: Secondary | ICD-10-CM

## 2014-04-20 DIAGNOSIS — E46 Unspecified protein-calorie malnutrition: Secondary | ICD-10-CM | POA: Diagnosis not present

## 2014-04-20 DIAGNOSIS — I252 Old myocardial infarction: Secondary | ICD-10-CM

## 2014-04-20 DIAGNOSIS — Z5189 Encounter for other specified aftercare: Secondary | ICD-10-CM | POA: Diagnosis not present

## 2014-04-20 DIAGNOSIS — K567 Ileus, unspecified: Secondary | ICD-10-CM | POA: Diagnosis present

## 2014-04-20 DIAGNOSIS — G4730 Sleep apnea, unspecified: Secondary | ICD-10-CM | POA: Diagnosis present

## 2014-04-20 DIAGNOSIS — I25810 Atherosclerosis of coronary artery bypass graft(s) without angina pectoris: Secondary | ICD-10-CM | POA: Diagnosis present

## 2014-04-20 HISTORY — DX: Old myocardial infarction: I25.2

## 2014-04-20 HISTORY — DX: Acute appendicitis with perforation and localized peritonitis, with abscess: K35.33

## 2014-04-20 HISTORY — DX: Peritoneal abscess: K65.1

## 2014-04-20 HISTORY — DX: Unspecified thoracic, thoracolumbar and lumbosacral intervertebral disc disorder: M51.9

## 2014-04-20 HISTORY — DX: Other postprocedural complications and disorders of digestive system: K91.89

## 2014-04-20 HISTORY — DX: Atherosclerosis of coronary artery bypass graft(s) without angina pectoris: I25.810

## 2014-04-20 HISTORY — DX: Hyperlipidemia, unspecified: E78.5

## 2014-04-20 HISTORY — DX: Heart failure, unspecified: I50.9

## 2014-04-20 HISTORY — DX: Ileus, unspecified: K56.7

## 2014-04-20 LAB — LIPASE, BLOOD: LIPASE: 22 U/L (ref 11–59)

## 2014-04-20 LAB — CBC WITH DIFFERENTIAL/PLATELET
Basophils Absolute: 0 10*3/uL (ref 0.0–0.1)
Basophils Relative: 0 % (ref 0–1)
Eosinophils Absolute: 0 10*3/uL (ref 0.0–0.7)
Eosinophils Relative: 0 % (ref 0–5)
HEMATOCRIT: 40.2 % (ref 39.0–52.0)
Hemoglobin: 14.5 g/dL (ref 13.0–17.0)
Lymphocytes Relative: 13 % (ref 12–46)
Lymphs Abs: 1.7 10*3/uL (ref 0.7–4.0)
MCH: 31.9 pg (ref 26.0–34.0)
MCHC: 36.1 g/dL — AB (ref 30.0–36.0)
MCV: 88.4 fL (ref 78.0–100.0)
MONO ABS: 1.2 10*3/uL — AB (ref 0.1–1.0)
Monocytes Relative: 9 % (ref 3–12)
Neutro Abs: 10.1 10*3/uL — ABNORMAL HIGH (ref 1.7–7.7)
Neutrophils Relative %: 78 % — ABNORMAL HIGH (ref 43–77)
Platelets: 181 10*3/uL (ref 150–400)
RBC: 4.55 MIL/uL (ref 4.22–5.81)
RDW: 12.8 % (ref 11.5–15.5)
WBC: 13 10*3/uL — ABNORMAL HIGH (ref 4.0–10.5)

## 2014-04-20 LAB — COMPREHENSIVE METABOLIC PANEL
ALT: 34 U/L (ref 0–53)
AST: 30 U/L (ref 0–37)
Albumin: 4.1 g/dL (ref 3.5–5.2)
Alkaline Phosphatase: 57 U/L (ref 39–117)
Anion gap: 17 — ABNORMAL HIGH (ref 5–15)
BUN: 20 mg/dL (ref 6–23)
CALCIUM: 9.8 mg/dL (ref 8.4–10.5)
CO2: 18 mEq/L — ABNORMAL LOW (ref 19–32)
CREATININE: 0.95 mg/dL (ref 0.50–1.35)
Chloride: 102 mEq/L (ref 96–112)
GFR calc Af Amer: >90 mL/min (ref >90)
GFR calc non Af Amer: 84 mL/min — ABNORMAL LOW (ref >90)
Glucose, Bld: 162 mg/dL — ABNORMAL HIGH (ref 70–99)
Potassium: 4 mEq/L (ref 3.7–5.3)
Sodium: 137 mEq/L (ref 137–147)
TOTAL PROTEIN: 7.1 g/dL (ref 6.0–8.3)
Total Bilirubin: 1.1 mg/dL (ref 0.3–1.2)

## 2014-04-20 MED ORDER — MORPHINE SULFATE 4 MG/ML IJ SOLN
4.0000 mg | Freq: Once | INTRAMUSCULAR | Status: AC
  Administered 2014-04-20: 4 mg via INTRAVENOUS
  Filled 2014-04-20: qty 1

## 2014-04-20 MED ORDER — OXYCODONE-ACETAMINOPHEN 5-325 MG PO TABS
1.0000 | ORAL_TABLET | Freq: Once | ORAL | Status: AC
  Administered 2014-04-20: 1 via ORAL
  Filled 2014-04-20: qty 1

## 2014-04-20 MED ORDER — SODIUM CHLORIDE 0.9 % IV SOLN
Freq: Once | INTRAVENOUS | Status: AC
  Administered 2014-04-20: 23:00:00 via INTRAVENOUS

## 2014-04-20 MED ORDER — ONDANSETRON HCL 4 MG/2ML IJ SOLN
4.0000 mg | Freq: Once | INTRAMUSCULAR | Status: AC
  Administered 2014-04-20: 4 mg via INTRAVENOUS
  Filled 2014-04-20: qty 2

## 2014-04-20 MED ORDER — IOHEXOL 300 MG/ML  SOLN
25.0000 mL | INTRAMUSCULAR | Status: AC
  Administered 2014-04-20: 25 mL via ORAL

## 2014-04-20 NOTE — ED Notes (Signed)
Pt c/o right lower abdominal pain starting this morning. Pt states pain started min mid abdomen and now has moved to right lower abdomen. Pt denies n/v/d, dysuria, hematuria. Last BM today and normal.

## 2014-04-20 NOTE — ED Provider Notes (Signed)
CSN: [id number]     Arrival date & time 04/20/14  2007 History   First MD Initiated Contact with Patient 04/20/14 2205     Chief Complaint  Patient presents with  . Abdominal Pain     (Consider location/radiation/quality/duration/timing/severity/associated sxs/prior Treatment) HPI Comments: 68 year old male, history of coronary disease status post bypass grafting in 2003. He presents with a complaint of abdominal discomfort which started earlier in the morning and was more diffuse but this evening has focused itself to the right lower quadrant. There is no nausea vomiting or diarrhea and he has no changes in bowel movements or urinary habits. He has no history of abdominal surgery. The symptoms are persistent, gradually worsening and are now moderate to severe.  Patient is a 68 y.o. male presenting with abdominal pain. The history is provided by the patient.  Abdominal Pain   Past Medical History  Diagnosis Date  . CAD (coronary artery disease)   . 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 Hernia, inguinal     right  . Hyperglycemia   . Hearing loss     mild  . Diabetes mellitus   . Hypertension   . Myocardial infarction    Past Surgical History  Procedure Laterality Date  . Back surgery  2001    lumbar  . Coronary artery bypass graft  2003    2V  . Coronary angioplasty      multiple  . Spine surgery     Family History  Problem Relation Age of Onset  . Heart disease Father   . Heart disease Brother   . Cancer Brother    History  Substance Use Topics  . Smoking status: Former Research scientist (life sciences)  . Smokeless tobacco: Former Systems developer  . Alcohol Use: Yes     Comment: 2 drinks    Review of Systems  Gastrointestinal: Positive for abdominal pain.  All other systems reviewed and are negative.     Allergies  Xanax  Home Medications   Prior to Admission medications   Medication Sig Start Date End Date Taking? Authorizing Provider  aspirin 81 MG chewable  tablet Chew 81 mg by mouth daily.   Yes Historical Provider, MD  fenofibrate (TRICOR) 145 MG tablet Take 145 mg by mouth daily.     Yes Historical Provider, MD  fish oil-omega-3 fatty acids 1000 MG capsule Take 2 g by mouth daily.     Yes Historical Provider, MD  lisinopril (PRINIVIL,ZESTRIL) 40 MG tablet Take 40 mg by mouth daily.     Yes Historical Provider, MD  metoprolol (TOPROL-XL) 50 MG 24 hr tablet Take 25 mg by mouth daily.    Yes Historical Provider, MD  rosuvastatin (CRESTOR) 5 MG tablet Take 5 mg by mouth daily.     Yes Historical Provider, MD   BP 139/73  Pulse 72  Temp(Src) 98 F (36.7 C)  Resp 18  Ht 6\' 1"  (1.854 m)  Wt 245 lb (111.131 kg)  BMI 32.33 kg/m2  SpO2 97% Physical Exam  Nursing note and vitals reviewed. Constitutional: He appears well-developed and well-nourished. No distress.  HENT:  Head: Normocephalic and atraumatic.  Mouth/Throat: Oropharynx is clear and moist. No oropharyngeal exudate.  Eyes: Conjunctivae and EOM are normal. Pupils are equal, round, and reactive to light. Right eye exhibits no discharge. Left eye exhibits no discharge. No scleral icterus.  Neck: Normal range of motion. Neck supple. No JVD present. No thyromegaly present.  Cardiovascular: Normal rate, regular rhythm,  normal heart sounds and intact distal pulses.  Exam reveals no gallop and no friction rub.   No murmur heard. Pulmonary/Chest: Effort normal and breath sounds normal. No respiratory distress. He has no wheezes. He has no rales.  Abdominal: Soft. Bowel sounds are normal. He exhibits no distension and no mass. There is tenderness ( Focal tenderness to the right lower quadrant with guarding, mild Rovsing sign, no other abdominal tenderness or peritoneal signs). There is guarding.  Musculoskeletal: Normal range of motion. He exhibits no edema and no tenderness.  Lymphadenopathy:    He has no cervical adenopathy.  Neurological: He is alert. Coordination normal.  Skin: Skin is warm  and dry. No rash noted. No erythema.  Psychiatric: He has a normal mood and affect. His behavior is normal.    ED Course  Procedures (including critical care time) Labs Review Labs Reviewed  CBC WITH DIFFERENTIAL - Abnormal; Notable for the following:    WBC 13.0 (*)    MCHC 36.1 (*)    Neutrophils Relative % 78 (*)    Neutro Abs 10.1 (*)    Monocytes Absolute 1.2 (*)    All other components within normal limits  COMPREHENSIVE METABOLIC PANEL - Abnormal; Notable for the following:    CO2 18 (*)    Glucose, Bld 162 (*)    GFR calc non Af Amer 84 (*)    Anion gap 17 (*)    All other components within normal limits  LIPASE, BLOOD  URINALYSIS, ROUTINE W REFLEX MICROSCOPIC    Imaging Review No results found.    MDM    Final diagnoses:  Acute appendicitis, unspecified acute appendicitis type     The patient's vital signs are unremarkable but he has significant tenderness to the right lower quadrant as well as a leukocytosis which could be consistent with acute appendicitis given the location of the pain. CT scan of the abdomen and pelvis pending, IV fluids, IV pain medications, reevaluation.  Highly suspect Appendicitis - VS without acute findings, pain meds as below, labs with increased WBC, CT ordered, and at change of shift has not been completed, care signed out to Dr. Darl Householder to f/u results and consult as indicated.  Meds given in ED:   Morphine, Zofran, Fluids  Johnna Acosta, MD 04/21/14 1010

## 2014-04-21 ENCOUNTER — Encounter (HOSPITAL_COMMUNITY): Admission: EM | Disposition: A | Discharge status: Home Health | Payer: Self-pay | Source: Home / Self Care

## 2014-04-21 ENCOUNTER — Encounter (HOSPITAL_COMMUNITY): Payer: Medicare Other | Admitting: Anesthesiology

## 2014-04-21 ENCOUNTER — Inpatient Hospital Stay (HOSPITAL_COMMUNITY): Payer: Medicare Other | Admitting: Anesthesiology

## 2014-04-21 ENCOUNTER — Encounter (HOSPITAL_COMMUNITY): Payer: Self-pay

## 2014-04-21 ENCOUNTER — Emergency Department (HOSPITAL_COMMUNITY): Payer: Medicare Other

## 2014-04-21 DIAGNOSIS — E119 Type 2 diabetes mellitus without complications: Secondary | ICD-10-CM | POA: Diagnosis present

## 2014-04-21 DIAGNOSIS — Z0389 Encounter for observation for other suspected diseases and conditions ruled out: Secondary | ICD-10-CM | POA: Diagnosis not present

## 2014-04-21 DIAGNOSIS — Z79899 Other long term (current) drug therapy: Secondary | ICD-10-CM | POA: Diagnosis not present

## 2014-04-21 DIAGNOSIS — E870 Hyperosmolality and hypernatremia: Secondary | ICD-10-CM | POA: Diagnosis not present

## 2014-04-21 DIAGNOSIS — K929 Disease of digestive system, unspecified: Secondary | ICD-10-CM | POA: Diagnosis not present

## 2014-04-21 DIAGNOSIS — E669 Obesity, unspecified: Secondary | ICD-10-CM | POA: Diagnosis present

## 2014-04-21 DIAGNOSIS — I2510 Atherosclerotic heart disease of native coronary artery without angina pectoris: Secondary | ICD-10-CM | POA: Diagnosis not present

## 2014-04-21 DIAGNOSIS — R188 Other ascites: Secondary | ICD-10-CM | POA: Diagnosis not present

## 2014-04-21 DIAGNOSIS — K3533 Acute appendicitis with perforation and localized peritonitis, with abscess: Secondary | ICD-10-CM | POA: Diagnosis not present

## 2014-04-21 DIAGNOSIS — R143 Flatulence: Secondary | ICD-10-CM | POA: Diagnosis not present

## 2014-04-21 DIAGNOSIS — I4891 Unspecified atrial fibrillation: Secondary | ICD-10-CM | POA: Diagnosis not present

## 2014-04-21 DIAGNOSIS — K651 Peritoneal abscess: Secondary | ICD-10-CM | POA: Diagnosis not present

## 2014-04-21 DIAGNOSIS — T8140XA Infection following a procedure, unspecified, initial encounter: Secondary | ICD-10-CM | POA: Diagnosis not present

## 2014-04-21 DIAGNOSIS — Y849 Medical procedure, unspecified as the cause of abnormal reaction of the patient, or of later complication, without mention of misadventure at the time of the procedure: Secondary | ICD-10-CM | POA: Diagnosis not present

## 2014-04-21 DIAGNOSIS — Z87891 Personal history of nicotine dependence: Secondary | ICD-10-CM | POA: Diagnosis not present

## 2014-04-21 DIAGNOSIS — R141 Gas pain: Secondary | ICD-10-CM | POA: Diagnosis not present

## 2014-04-21 DIAGNOSIS — Z951 Presence of aortocoronary bypass graft: Secondary | ICD-10-CM | POA: Diagnosis not present

## 2014-04-21 DIAGNOSIS — Z4682 Encounter for fitting and adjustment of non-vascular catheter: Secondary | ICD-10-CM | POA: Diagnosis not present

## 2014-04-21 DIAGNOSIS — E876 Hypokalemia: Secondary | ICD-10-CM | POA: Diagnosis not present

## 2014-04-21 DIAGNOSIS — K560 Paralytic ileus: Secondary | ICD-10-CM | POA: Diagnosis not present

## 2014-04-21 DIAGNOSIS — E46 Unspecified protein-calorie malnutrition: Secondary | ICD-10-CM | POA: Diagnosis not present

## 2014-04-21 DIAGNOSIS — M7989 Other specified soft tissue disorders: Secondary | ICD-10-CM | POA: Diagnosis not present

## 2014-04-21 DIAGNOSIS — Z7982 Long term (current) use of aspirin: Secondary | ICD-10-CM | POA: Diagnosis not present

## 2014-04-21 DIAGNOSIS — K7689 Other specified diseases of liver: Secondary | ICD-10-CM | POA: Diagnosis not present

## 2014-04-21 DIAGNOSIS — K3580 Unspecified acute appendicitis: Secondary | ICD-10-CM | POA: Diagnosis not present

## 2014-04-21 DIAGNOSIS — K389 Disease of appendix, unspecified: Secondary | ICD-10-CM | POA: Diagnosis not present

## 2014-04-21 DIAGNOSIS — Z5189 Encounter for other specified aftercare: Secondary | ICD-10-CM | POA: Diagnosis not present

## 2014-04-21 DIAGNOSIS — I10 Essential (primary) hypertension: Secondary | ICD-10-CM | POA: Diagnosis present

## 2014-04-21 DIAGNOSIS — Z452 Encounter for adjustment and management of vascular access device: Secondary | ICD-10-CM | POA: Diagnosis not present

## 2014-04-21 DIAGNOSIS — I252 Old myocardial infarction: Secondary | ICD-10-CM | POA: Diagnosis not present

## 2014-04-21 DIAGNOSIS — E785 Hyperlipidemia, unspecified: Secondary | ICD-10-CM | POA: Diagnosis present

## 2014-04-21 DIAGNOSIS — Y9210 Unspecified residential institution as the place of occurrence of the external cause: Secondary | ICD-10-CM | POA: Diagnosis not present

## 2014-04-21 HISTORY — PX: LAPAROSCOPIC APPENDECTOMY: SHX408

## 2014-04-21 LAB — SURGICAL PCR SCREEN
MRSA, PCR: NEGATIVE
Staphylococcus aureus: POSITIVE — AB

## 2014-04-21 LAB — URINALYSIS, ROUTINE W REFLEX MICROSCOPIC
GLUCOSE, UA: NEGATIVE mg/dL
Hgb urine dipstick: NEGATIVE
Ketones, ur: NEGATIVE mg/dL
Leukocytes, UA: NEGATIVE
Nitrite: NEGATIVE
PH: 5 (ref 5.0–8.0)
Protein, ur: NEGATIVE mg/dL
Specific Gravity, Urine: 1.025 (ref 1.005–1.030)
Urobilinogen, UA: 0.2 mg/dL (ref 0.0–1.0)

## 2014-04-21 LAB — GLUCOSE, CAPILLARY
GLUCOSE-CAPILLARY: 130 mg/dL — AB (ref 70–99)
GLUCOSE-CAPILLARY: 135 mg/dL — AB (ref 70–99)
Glucose-Capillary: 140 mg/dL — ABNORMAL HIGH (ref 70–99)
Glucose-Capillary: 143 mg/dL — ABNORMAL HIGH (ref 70–99)
Glucose-Capillary: 153 mg/dL — ABNORMAL HIGH (ref 70–99)

## 2014-04-21 LAB — HEMOGLOBIN A1C
HEMOGLOBIN A1C: 6.9 % — AB (ref <5.7)
Mean Plasma Glucose: 151 mg/dL — ABNORMAL HIGH (ref <117)

## 2014-04-21 SURGERY — APPENDECTOMY, LAPAROSCOPIC
Anesthesia: General | Site: Abdomen

## 2014-04-21 MED ORDER — INSULIN ASPART 100 UNIT/ML ~~LOC~~ SOLN
0.0000 [IU] | SUBCUTANEOUS | Status: DC
Start: 2014-04-21 — End: 2014-04-30
  Administered 2014-04-21: 3 [IU] via SUBCUTANEOUS
  Administered 2014-04-21 – 2014-04-25 (×17): 2 [IU] via SUBCUTANEOUS
  Administered 2014-04-26: 3 [IU] via SUBCUTANEOUS
  Administered 2014-04-27 – 2014-04-30 (×12): 2 [IU] via SUBCUTANEOUS
  Administered 2014-04-30: 1 [IU] via SUBCUTANEOUS

## 2014-04-21 MED ORDER — SUCCINYLCHOLINE CHLORIDE 20 MG/ML IJ SOLN
INTRAMUSCULAR | Status: DC | PRN
  Administered 2014-04-21: 140 mg via INTRAVENOUS

## 2014-04-21 MED ORDER — MUPIROCIN 2 % EX OINT
1.0000 "application " | TOPICAL_OINTMENT | Freq: Two times a day (BID) | CUTANEOUS | Status: AC
  Administered 2014-04-21 – 2014-04-26 (×10): 1 via NASAL
  Filled 2014-04-21 (×2): qty 22

## 2014-04-21 MED ORDER — DEXTROSE 5 % IV SOLN
1.0000 g | Freq: Once | INTRAVENOUS | Status: AC
  Administered 2014-04-21: 1 g via INTRAVENOUS
  Filled 2014-04-21: qty 10

## 2014-04-21 MED ORDER — 0.9 % SODIUM CHLORIDE (POUR BTL) OPTIME
TOPICAL | Status: DC | PRN
  Administered 2014-04-21: 1000 mL

## 2014-04-21 MED ORDER — METRONIDAZOLE IN NACL 5-0.79 MG/ML-% IV SOLN
500.0000 mg | Freq: Once | INTRAVENOUS | Status: AC
  Administered 2014-04-21: 500 mg via INTRAVENOUS
  Filled 2014-04-21: qty 100

## 2014-04-21 MED ORDER — NEOSTIGMINE METHYLSULFATE 10 MG/10ML IV SOLN
INTRAVENOUS | Status: AC
  Filled 2014-04-21: qty 1

## 2014-04-21 MED ORDER — DOCUSATE SODIUM 100 MG PO CAPS
100.0000 mg | ORAL_CAPSULE | Freq: Two times a day (BID) | ORAL | Status: DC
  Administered 2014-04-21 – 2014-04-22 (×3): 100 mg via ORAL
  Filled 2014-04-21 (×3): qty 1

## 2014-04-21 MED ORDER — ENOXAPARIN SODIUM 40 MG/0.4ML ~~LOC~~ SOLN
40.0000 mg | SUBCUTANEOUS | Status: DC
  Administered 2014-04-21 – 2014-05-03 (×12): 40 mg via SUBCUTANEOUS
  Filled 2014-04-21 (×15): qty 0.4

## 2014-04-21 MED ORDER — NEOSTIGMINE METHYLSULFATE 10 MG/10ML IV SOLN
INTRAVENOUS | Status: DC | PRN
  Administered 2014-04-21: 5 mg via INTRAVENOUS

## 2014-04-21 MED ORDER — FENTANYL CITRATE 0.05 MG/ML IJ SOLN
INTRAMUSCULAR | Status: AC
  Filled 2014-04-21: qty 5

## 2014-04-21 MED ORDER — DEXAMETHASONE SODIUM PHOSPHATE 4 MG/ML IJ SOLN
INTRAMUSCULAR | Status: DC | PRN
  Administered 2014-04-21: 4 mg via INTRAVENOUS

## 2014-04-21 MED ORDER — LACTATED RINGERS IV SOLN
INTRAVENOUS | Status: DC
  Administered 2014-04-21: 50 mL/h via INTRAVENOUS

## 2014-04-21 MED ORDER — LACTATED RINGERS IV SOLN
INTRAVENOUS | Status: DC | PRN
  Administered 2014-04-21 (×2): via INTRAVENOUS

## 2014-04-21 MED ORDER — GLYCOPYRROLATE 0.2 MG/ML IJ SOLN
INTRAMUSCULAR | Status: AC
  Filled 2014-04-21: qty 2

## 2014-04-21 MED ORDER — ARTIFICIAL TEARS OP OINT
TOPICAL_OINTMENT | OPHTHALMIC | Status: AC
  Filled 2014-04-21: qty 3.5

## 2014-04-21 MED ORDER — GLYCOPYRROLATE 0.2 MG/ML IJ SOLN
INTRAMUSCULAR | Status: DC | PRN
  Administered 2014-04-21: .8 mg via INTRAVENOUS

## 2014-04-21 MED ORDER — LIDOCAINE HCL (CARDIAC) 20 MG/ML IV SOLN
INTRAVENOUS | Status: DC | PRN
  Administered 2014-04-21: 100 mg via INTRAVENOUS

## 2014-04-21 MED ORDER — HYDROMORPHONE HCL PF 1 MG/ML IJ SOLN
1.0000 mg | INTRAMUSCULAR | Status: DC | PRN
  Administered 2014-04-21: 1 mg via INTRAVENOUS
  Filled 2014-04-21: qty 1

## 2014-04-21 MED ORDER — FENTANYL CITRATE 0.05 MG/ML IJ SOLN
INTRAMUSCULAR | Status: DC | PRN
  Administered 2014-04-21: 50 ug via INTRAVENOUS
  Administered 2014-04-21: 100 ug via INTRAVENOUS
  Administered 2014-04-21 (×2): 50 ug via INTRAVENOUS

## 2014-04-21 MED ORDER — ATORVASTATIN CALCIUM 10 MG PO TABS
10.0000 mg | ORAL_TABLET | Freq: Every day | ORAL | Status: DC
  Administered 2014-04-21 – 2014-04-22 (×2): 10 mg via ORAL
  Filled 2014-04-21 (×4): qty 1

## 2014-04-21 MED ORDER — SODIUM CHLORIDE 0.9 % IV SOLN
INTRAVENOUS | Status: AC
  Administered 2014-04-21: 04:00:00 via INTRAVENOUS

## 2014-04-21 MED ORDER — METOPROLOL SUCCINATE ER 25 MG PO TB24
25.0000 mg | ORAL_TABLET | Freq: Every day | ORAL | Status: DC
  Administered 2014-04-21 – 2014-04-23 (×3): 25 mg via ORAL
  Filled 2014-04-21 (×5): qty 1

## 2014-04-21 MED ORDER — SCOPOLAMINE 1 MG/3DAYS TD PT72: 1.0000 | MEDICATED_PATCH | TRANSDERMAL | Status: DC

## 2014-04-21 MED ORDER — NEOSTIGMINE METHYLSULFATE 10 MG/10ML IV SOLN: INTRAVENOUS | Status: DC | PRN

## 2014-04-21 MED ORDER — PROMETHAZINE HCL 25 MG/ML IJ SOLN: 6.2500 mg | INTRAMUSCULAR | Status: DC | PRN

## 2014-04-21 MED ORDER — BUPIVACAINE-EPINEPHRINE 0.25% -1:200000 IJ SOLN
INTRAMUSCULAR | Status: DC | PRN
  Administered 2014-04-21: 9 mL

## 2014-04-21 MED ORDER — DEXAMETHASONE SODIUM PHOSPHATE 4 MG/ML IJ SOLN
INTRAMUSCULAR | Status: AC
  Filled 2014-04-21: qty 1

## 2014-04-21 MED ORDER — FENTANYL CITRATE 0.05 MG/ML IJ SOLN
100.0000 ug | INTRAMUSCULAR | Status: DC | PRN
  Administered 2014-04-21: 100 ug via INTRAVENOUS
  Filled 2014-04-21: qty 2

## 2014-04-21 MED ORDER — DEXTROSE 5 % IV SOLN
1.0000 g | INTRAVENOUS | Status: DC
  Administered 2014-04-22 – 2014-04-26 (×5): 1 g via INTRAVENOUS
  Filled 2014-04-21 (×6): qty 10

## 2014-04-21 MED ORDER — SODIUM CHLORIDE 0.9 % IV SOLN
INTRAVENOUS | Status: DC
  Administered 2014-04-22: 04:00:00 via INTRAVENOUS

## 2014-04-21 MED ORDER — IOHEXOL 300 MG/ML  SOLN
100.0000 mL | Freq: Once | INTRAMUSCULAR | Status: AC | PRN
  Administered 2014-04-21: 100 mL via INTRAVENOUS

## 2014-04-21 MED ORDER — MEPERIDINE HCL 25 MG/ML IJ SOLN: 6.2500 mg | INTRAMUSCULAR | Status: DC | PRN

## 2014-04-21 MED ORDER — LISINOPRIL 40 MG PO TABS
40.0000 mg | ORAL_TABLET | Freq: Every day | ORAL | Status: DC
  Administered 2014-04-22 – 2014-04-23 (×2): 40 mg via ORAL
  Filled 2014-04-21 (×4): qty 1
  Filled 2014-04-21: qty 2

## 2014-04-21 MED ORDER — HYDROMORPHONE HCL PF 1 MG/ML IJ SOLN
1.0000 mg | INTRAMUSCULAR | Status: AC | PRN
  Administered 2014-04-21: 1 mg via INTRAVENOUS
  Filled 2014-04-21: qty 1

## 2014-04-21 MED ORDER — SODIUM CHLORIDE 0.9 % IJ SOLN
INTRAMUSCULAR | Status: AC
  Filled 2014-04-21: qty 10

## 2014-04-21 MED ORDER — ROCURONIUM BROMIDE 100 MG/10ML IV SOLN
INTRAVENOUS | Status: DC | PRN
  Administered 2014-04-21: 30 mg via INTRAVENOUS
  Administered 2014-04-21: 10 mg via INTRAVENOUS

## 2014-04-21 MED ORDER — ONDANSETRON HCL 4 MG/2ML IJ SOLN
INTRAMUSCULAR | Status: DC | PRN
  Administered 2014-04-21: 4 mg via INTRAVENOUS

## 2014-04-21 MED ORDER — BUPIVACAINE-EPINEPHRINE (PF) 0.25% -1:200000 IJ SOLN
INTRAMUSCULAR | Status: AC
Start: 2014-04-21 — End: 2014-04-21
  Filled 2014-04-21: qty 30

## 2014-04-21 MED ORDER — PROPOFOL 10 MG/ML IV BOLUS
INTRAVENOUS | Status: AC
  Filled 2014-04-21: qty 20

## 2014-04-21 MED ORDER — PANTOPRAZOLE SODIUM 40 MG IV SOLR
40.0000 mg | Freq: Every day | INTRAVENOUS | Status: DC
  Administered 2014-04-21 – 2014-05-02 (×12): 40 mg via INTRAVENOUS
  Filled 2014-04-21 (×18): qty 40

## 2014-04-21 MED ORDER — ROCURONIUM BROMIDE 50 MG/5ML IV SOLN
INTRAVENOUS | Status: AC
  Filled 2014-04-21: qty 1

## 2014-04-21 MED ORDER — ONDANSETRON HCL 4 MG/2ML IJ SOLN: 4.0000 mg | Freq: Three times a day (TID) | INTRAMUSCULAR | Status: AC | PRN

## 2014-04-21 MED ORDER — BUPIVACAINE-EPINEPHRINE (PF) 0.25% -1:200000 IJ SOLN
INTRAMUSCULAR | Status: AC
  Filled 2014-04-21: qty 30

## 2014-04-21 MED ORDER — FENTANYL CITRATE 0.05 MG/ML IJ SOLN: 25.0000 ug | INTRAMUSCULAR | Status: DC | PRN

## 2014-04-21 MED ORDER — METRONIDAZOLE IN NACL 5-0.79 MG/ML-% IV SOLN
500.0000 mg | Freq: Three times a day (TID) | INTRAVENOUS | Status: DC
  Administered 2014-04-21 – 2014-04-27 (×17): 500 mg via INTRAVENOUS
  Filled 2014-04-21 (×20): qty 100

## 2014-04-21 MED ORDER — METOPROLOL SUCCINATE ER 25 MG PO TB24: 25.0000 mg | ORAL_TABLET | Freq: Every day | ORAL | Status: DC

## 2014-04-21 MED ORDER — SODIUM CHLORIDE 0.9 % IR SOLN
Status: DC | PRN
  Administered 2014-04-21: 1000 mL

## 2014-04-21 MED ORDER — OXYCODONE-ACETAMINOPHEN 5-325 MG PO TABS
1.0000 | ORAL_TABLET | ORAL | Status: DC | PRN
  Administered 2014-04-21 – 2014-04-22 (×4): 2 via ORAL
  Filled 2014-04-21 (×4): qty 2

## 2014-04-21 MED ORDER — CHLORHEXIDINE GLUCONATE CLOTH 2 % EX PADS
6.0000 | MEDICATED_PAD | Freq: Every day | CUTANEOUS | Status: AC
  Administered 2014-04-21 – 2014-04-25 (×5): 6 via TOPICAL

## 2014-04-21 MED ORDER — ONDANSETRON HCL 4 MG/2ML IJ SOLN
INTRAMUSCULAR | Status: AC
  Filled 2014-04-21: qty 2

## 2014-04-21 MED ORDER — PROPOFOL 10 MG/ML IV BOLUS
INTRAVENOUS | Status: DC | PRN
  Administered 2014-04-21: 180 mg via INTRAVENOUS

## 2014-04-21 SURGICAL SUPPLY — 41 items
0 VICRYL UR-6 REF: J603 ×2 IMPLANT
APPLIER CLIP ROT 10 11.4 M/L (STAPLE)
CANISTER SUCTION 2500CC (MISCELLANEOUS) ×2 IMPLANT
CHLORAPREP W/TINT 26ML (MISCELLANEOUS) ×2 IMPLANT
CLIP APPLIE ROT 10 11.4 M/L (STAPLE) IMPLANT
COVER SURGICAL LIGHT HANDLE (MISCELLANEOUS) ×2 IMPLANT
CUTTER FLEX LINEAR 45M (STAPLE) ×2 IMPLANT
DERMABOND ADVANCED (GAUZE/BANDAGES/DRESSINGS) ×1
DERMABOND ADVANCED .7 DNX12 (GAUZE/BANDAGES/DRESSINGS) ×1 IMPLANT
DEVICE TROCAR PUNCTURE CLOSURE (ENDOMECHANICALS) ×2 IMPLANT
ELECT REM PT RETURN 9FT ADLT (ELECTROSURGICAL) ×2
ELECTRODE REM PT RTRN 9FT ADLT (ELECTROSURGICAL) ×1 IMPLANT
GLOVE BIO SURGEON STRL SZ7 (GLOVE) ×4 IMPLANT
GLOVE BIOGEL PI IND STRL 7.0 (GLOVE) ×3 IMPLANT
GLOVE BIOGEL PI IND STRL 7.5 (GLOVE) ×1 IMPLANT
GLOVE BIOGEL PI INDICATOR 7.0 (GLOVE) ×3
GLOVE BIOGEL PI INDICATOR 7.5 (GLOVE) ×1
GLOVE SURG SS PI 7.0 STRL IVOR (GLOVE) ×2 IMPLANT
GOWN STRL REUS W/ TWL LRG LVL3 (GOWN DISPOSABLE) ×3 IMPLANT
GOWN STRL REUS W/TWL LRG LVL3 (GOWN DISPOSABLE) ×3
KIT BASIN OR (CUSTOM PROCEDURE TRAY) ×2 IMPLANT
KIT ROOM TURNOVER OR (KITS) ×2 IMPLANT
NS IRRIG 1000ML POUR BTL (IV SOLUTION) ×2 IMPLANT
PAD ARMBOARD 7.5X6 YLW CONV (MISCELLANEOUS) ×4 IMPLANT
POUCH RETRIEVAL ECOSAC 10 (ENDOMECHANICALS) ×1 IMPLANT
POUCH RETRIEVAL ECOSAC 10MM (ENDOMECHANICALS) ×1
RELOAD 45 VASCULAR/THIN (ENDOMECHANICALS) ×2 IMPLANT
RELOAD STAPLE TA45 3.5 REG BLU (ENDOMECHANICALS) IMPLANT
SCALPEL HARMONIC ACE (MISCELLANEOUS) ×2 IMPLANT
SCISSORS LAP 5X35 DISP (ENDOMECHANICALS) IMPLANT
SET IRRIG TUBING LAPAROSCOPIC (IRRIGATION / IRRIGATOR) ×2 IMPLANT
SLEEVE ENDOPATH XCEL 5M (ENDOMECHANICALS) ×2 IMPLANT
SPECIMEN JAR SMALL (MISCELLANEOUS) ×2 IMPLANT
SUT MNCRL AB 4-0 PS2 18 (SUTURE) ×2 IMPLANT
TOWEL OR 17X24 6PK STRL BLUE (TOWEL DISPOSABLE) IMPLANT
TOWEL OR 17X26 10 PK STRL BLUE (TOWEL DISPOSABLE) ×2 IMPLANT
TRAY FOLEY CATH 16FR SILVER (SET/KITS/TRAYS/PACK) ×2 IMPLANT
TRAY LAPAROSCOPIC (CUSTOM PROCEDURE TRAY) ×2 IMPLANT
TROCAR XCEL BLUNT TIP 100MML (ENDOMECHANICALS) ×2 IMPLANT
TROCAR XCEL NON-BLD 5MMX100MML (ENDOMECHANICALS) ×2 IMPLANT
TUBING INSUF HEATED (TUBING) ×2 IMPLANT

## 2014-04-21 NOTE — ED Provider Notes (Signed)
  Physical Exam  BP 149/74  Pulse 74  Temp(Src) 99.1 F (37.3 C)  Resp 18  Ht 6\' 1"  (1.854 m)  Wt 245 lb (111.131 kg)  BMI 32.33 kg/m2  SpO2 96%  Physical Exam  ED Course  Procedures  Care assumed at sign out from Dr. Sabra Heck. CT showed appendicitis. Called surgery, Dr. Hulen Skains recommend ceftriaxone, flagyl, surgery admit for appendectomy in AM.      Wandra Arthurs, MD 04/21/14 0120

## 2014-04-21 NOTE — ED Notes (Signed)
Pt. Son left his number to call him in case of Perry [name] [phone]

## 2014-04-21 NOTE — ED Notes (Signed)
C/o pain.  Returning from c-t

## 2014-04-21 NOTE — Transfer of Care (Signed)
Immediate Anesthesia Transfer of Care Note  Patient: Perry [name]  Procedure(s) Performed: Procedure(s): APPENDECTOMY LAPAROSCOPIC (N/A)  Patient Location: PACU  Anesthesia Type:General  Level of Consciousness: awake, alert  and oriented  Airway & Oxygen Therapy: Patient Spontanous Breathing and Patient connected to face mask oxygen  Post-op Assessment: Report given to PACU RN  Post vital signs: Reviewed and stable  Complications: No apparent anesthesia complications

## 2014-04-21 NOTE — Op Note (Signed)
Preoperative diagnosis: Acute appendicitis Postoperative diagnosis: Acute perforated appendicitis Procedure: Laparoscopic appendectomy Surgeon: Dr. Serita Grammes Anesthesia: Gen. Estimated blood loss: Minimal Drains: None Specimens: Appendix to pathology Sponge count correct at completion Disposition to recovery stable  Indications: This is 68 year old male with a history of a normal colonoscopy presents with right lower quadrant pain, elevated white blood cell count, and a clinical exam and CT scan consistent with acute appendicitis. He was admitted by one of my partners overnight and begun on antibiotics. I saw him this morning we decided to proceed in the operating room and laparoscopic appendectomy.  Procedure: After informed consent was obtained the patient was taken to the operating room. He was started on antibiotics on the floor. He had sequential compression devices on his legs. He was then placed a general anesthesia without complication. He had a Foley catheter placed. His abdomen was prepped and draped in the standard sterile surgical fashion. Surgical timeout was performed.  I injected Marcaine below his umbilicus. I made a vertical incision. I entered his fascia sharply and the peritoneum bluntly. I placed a 0 Vicryl pursestring suture through the fascia. I then placed a Hassan trocar and insufflated the abdomen to 15 mm mercury pressure. I then inserted 2 further 5 mm trocars in the suprapubic region in the left lower quadrant. He was noted to have purulence in his right lower quadrant. I evacuated all of this. I then was able to identify the appendix. There was small bowel that was adherent to it that I removed. I then was able to divide the appendiceal mesentery with the Harmonic scalpel. I then divided the appendix at the base with the stapler. This was clearly at the base. The base was viable although the remainder of the appendix was necrotic. There was a lot of exudate as well as  purulence in this region. The staple line was hemostatic ago. I then placed the appendix in a bag and removed from the umbilicus. I then irrigated until this was clear. I placed omentum in the right lower quadrant. I then removed my Hassan trocar. I placed an additional 0 Vicryl suture after tying down the pursestring with the Endo Close device to completely close the defect. I then removed my trocars. These were closed 4-0 Monocryl and Dermabond. He tolerated this well was extubated and transferred to the recovery room in stable condition.

## 2014-04-21 NOTE — ED Notes (Signed)
Report called  To 6n

## 2014-04-21 NOTE — Progress Notes (Signed)
Subjective: rlq pain  Objective: Vital signs in last 24 hours: Temp:  [98 F (36.7 C)-99.4 F (37.4 C)] 99.4 F (37.4 C) (09/15 0347) Pulse Rate:  [61-82] 80 (09/15 0347) Resp:  [15-20] 18 (09/15 0347) BP: (128-149)/(54-74) 139/70 mmHg (09/15 0347) SpO2:  [93 %-98 %] 98 % (09/15 0347) Weight:  [242 lb 9.6 oz (110.043 kg)-245 lb (111.131 kg)] 242 lb 9.6 oz (110.043 kg) (09/15 0347) Last BM Date: 04/20/14  Intake/Output from previous day: 09/14 0701 - 09/15 0700 In: 203.3 [I.V.:203.3] Out: -  Intake/Output this shift:    General appearance: no distress GI: rlq tenderness  Lab Results:   Recent Labs  04/20/14 2032  WBC 13.0*  HGB 14.5  HCT 40.2  PLT 181   BMET  Recent Labs  04/20/14 2032  NA 137  K 4.0  CL 102  CO2 18*  GLUCOSE 162*  BUN 20  CREATININE 0.95  CALCIUM 9.8   PT/INR No results found for this basename: LABPROT, INR,  in the last 72 hours ABG No results found for this basename: PHART, PCO2, PO2, HCO3,  in the last 72 hours  Studies/Results: Ct Abdomen Pelvis W Contrast  04/21/2014   CLINICAL DATA:  RIGHT lower quadrant pain to palpation; assess for appendicitis.  EXAM: CT ABDOMEN AND PELVIS WITH CONTRAST  TECHNIQUE: Multidetector CT imaging of the abdomen and pelvis was performed using the standard protocol following bolus administration of intravenous contrast.  CONTRAST:  119mL OMNIPAQUE IOHEXOL 300 MG/ML  SOLN  COMPARISON:  Pelvic radiograph January 22, 2014  FINDINGS: LUNG BASES: Included view of the lung bases are clear. The heart is mildly enlarged. Partially imaged coronary artery calcifications. Included pericardium is unremarkable.  SOLID ORGANS: The liver is diffusely hypodense, consistent with steatosis, prominent LEFT lobe of the liver. Spleen, gallbladder and adrenal glands are unremarkable. Mildly atrophic pancreas.  GASTROINTESTINAL TRACT: Small hiatal hernia. Contracted stomach results and thickened appearance of the walls. The small  and large bowel are normal in course and caliber without inflammatory changes. The appendix is a large, 12 mm with punctate central appendicolith, surrounding periappendiceal inflammation.  KIDNEYS/ URINARY TRACT: Kidneys are orthotopic, demonstrating symmetric enhancement. No nephrolithiasis, hydronephrosis or solid renal masses. 2.3 cm exophytic RIGHT upper pole nonenhancing cyst. Too small to characterize RIGHT upper pole hypodensity. The unopacified ureters are normal in course and caliber. Delayed imaging through the kidneys demonstrates symmetric prompt contrast excretion within the proximal urinary collecting system. Urinary bladder is partially distended and unremarkable.  PERITONEUM/RETROPERITONEUM: No intraperitoneal free fluid nor free air. Aortoiliac vessels are normal in caliber, tortuous in course with mild calcific atherosclerosis No lymphadenopathy by CT size criteria. Prostate is enlarged, 5.2 x 5.7 cm.  SOFT TISSUE/OSSEOUS STRUCTURES: Nonsuspicious. Moderate to large bilateral fat containing inguinal hernias. Moderate lumbar spondylosis, resulting in moderate to severe L3-4, L4-5 neural foraminal narrowing.  IMPRESSION: Acute uncomplicated appendicitis.  Hepatic steatosis and borderline hepatomegaly.  Acute findings discussed with and reconfirmed by Dr.DAVID YAO on9/15/2015at1:11 am.   Electronically Signed   By: Elon Alas   On: 04/21/2014 01:12    Anti-infectives: Anti-infectives   Start     Dose/Rate Route Frequency Ordered Stop   04/22/14 0300  cefTRIAXone (ROCEPHIN) 1 g in dextrose 5 % 50 mL IVPB     1 g 100 mL/hr over 30 Minutes Intravenous Every 24 hours 04/21/14 0514     04/21/14 1200  metroNIDAZOLE (FLAGYL) IVPB 500 mg     500 mg 100 mL/hr over 60 Minutes  Intravenous Every 8 hours 04/21/14 0514     04/21/14 0130  cefTRIAXone (ROCEPHIN) 1 g in dextrose 5 % 50 mL IVPB     1 g 100 mL/hr over 30 Minutes Intravenous  Once 04/21/14 0120 04/21/14 0347   04/21/14 0130   metroNIDAZOLE (FLAGYL) IVPB 500 mg     500 mg 100 mL/hr over 60 Minutes Intravenous  Once 04/21/14 0120 04/21/14 0349      Assessment/Plan: Appendicitis  Plan lap appy today, risk/benefits discussed He is up to date on his colonoscopy  [facility] 04/21/2014

## 2014-04-21 NOTE — Anesthesia Preprocedure Evaluation (Addendum)
Anesthesia Evaluation  Patient identified by MRN, date of birth, ID band Patient awake    Reviewed: Allergy & Precautions, H&P , NPO status , Patient's Chart, lab work & pertinent test results  Airway       Dental   Pulmonary former smoker,          Cardiovascular hypertension, Pt. on medications + Past MI (prior to ACB inferior) CAD: SP ACB, 2 grafts, stents removed., no symptoms now.  EF 45% BOTH from ECHO 2015 and stress 2014   Neuro/Psych    GI/Hepatic   Endo/Other  diabetes, Well Controlled, Type 2  Renal/GU      Musculoskeletal   Abdominal   Peds  Hematology   Anesthesia Other Findings   Reproductive/Obstetrics                          Anesthesia Physical Anesthesia Plan  ASA: III  Anesthesia Plan: General   Post-op Pain Management:    Induction: Intravenous  Airway Management Planned: Oral ETT  Additional Equipment:   Intra-op Plan:   Post-operative Plan: Extubation in OR  Informed Consent: I have reviewed the patients History and Physical, chart, labs and discussed the procedure including the risks, benefits and alternatives for the proposed anesthesia with the patient or authorized representative who has indicated his/her understanding and acceptance.     Plan Discussed with:   Anesthesia Plan Comments:         Anesthesia Quick Evaluation

## 2014-04-21 NOTE — ED Notes (Signed)
Rocephin hung 1 fgm

## 2014-04-21 NOTE — Anesthesia Postprocedure Evaluation (Signed)
  Anesthesia Post-op Note  Patient: Perry [name]  Procedure(s) Performed: Procedure(s): APPENDECTOMY LAPAROSCOPIC (N/A)  Patient Location: PACU  Anesthesia Type:General  Level of Consciousness: awake, alert  and oriented  Airway and Oxygen Therapy: Patient Spontanous Breathing and Patient connected to nasal cannula oxygen  Post-op Pain: mild  Post-op Assessment: Post-op Vital signs reviewed  Post-op Vital Signs: Reviewed and stable  Last Vitals:  Filed Vitals:   04/21/14 1025  BP: 131/70  Pulse: 82  Temp:   Resp:     Complications: No apparent anesthesia complications

## 2014-04-21 NOTE — H&P (Signed)
Perry [name] is an 68 y.o. male.   Chief Complaint: Abdominal pain HPI: The patient has been having abdominal pain for about 24 hours unassociated with nausea or vomiting.  The pain started in a generalized periumbilical manner and subsequently localized to the right lower quadrant.    Nothing releived the pain.  CT supported the diagnosis of acute appendicitis.  Past Medical History  Diagnosis Date  . CAD (coronary artery disease)   . 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 Hernia, inguinal     right  . Hyperglycemia   . Hearing loss     mild  . Diabetes mellitus   . Hypertension   . Myocardial infarction     Past Surgical History  Procedure Laterality Date  . Back surgery  2001    lumbar  . Coronary artery bypass graft  2003    2V  . Coronary angioplasty      multiple  . Spine surgery      Family History  Problem Relation Age of Onset  . Heart disease Father   . Heart disease Brother   . Cancer Brother    Social History:  reports that he has quit smoking. He has quit using smokeless tobacco. He reports that he drinks alcohol. He reports that he does not use illicit drugs.  Allergies:  Allergies  Allergen Reactions  . Xanax [Alprazolam] Shortness Of Breath    Medications Prior to Admission  Medication Sig Dispense Refill  . aspirin 81 MG chewable tablet Chew 81 mg by mouth daily.      Marland Kitchen bismuth subsalicylate (PEPTO BISMOL) 262 MG chewable tablet Chew 524 mg by mouth as needed for indigestion.      . fenofibrate (TRICOR) 145 MG tablet Take 145 mg by mouth daily.        . fish oil-omega-3 fatty acids 1000 MG capsule Take 2 g by mouth daily.        Marland Kitchen lisinopril (PRINIVIL,ZESTRIL) 40 MG tablet Take 40 mg by mouth daily.        . metoprolol (TOPROL-XL) 50 MG 24 hr tablet Take 25 mg by mouth daily.       . rosuvastatin (CRESTOR) 5 MG tablet Take 5 mg by mouth daily.          Results for orders placed during the hospital encounter of 04/20/14  (from the past 48 hour(s))  CBC WITH DIFFERENTIAL     Status: Abnormal   Collection Time    04/20/14  8:32 PM      Result Value Ref Range   WBC 13.0 (*) 4.0 - 10.5 K/uL   RBC 4.55  4.22 - 5.81 MIL/uL   Hemoglobin 14.5  13.0 - 17.0 g/dL   HCT 40.2  39.0 - 52.0 %   MCV 88.4  78.0 - 100.0 fL   MCH 31.9  26.0 - 34.0 pg   MCHC 36.1 (*) 30.0 - 36.0 g/dL   RDW 12.8  11.5 - 15.5 %   Platelets 181  150 - 400 K/uL   Neutrophils Relative % 78 (*) 43 - 77 %   Neutro Abs 10.1 (*) 1.7 - 7.7 K/uL   Lymphocytes Relative 13  12 - 46 %   Lymphs Abs 1.7  0.7 - 4.0 K/uL   Monocytes Relative 9  3 - 12 %   Monocytes Absolute 1.2 (*) 0.1 - 1.0 K/uL   Eosinophils Relative 0  0 - 5 %  Eosinophils Absolute 0.0  0.0 - 0.7 K/uL   Basophils Relative 0  0 - 1 %   Basophils Absolute 0.0  0.0 - 0.1 K/uL  COMPREHENSIVE METABOLIC PANEL     Status: Abnormal   Collection Time    04/20/14  8:32 PM      Result Value Ref Range   Sodium 137  137 - 147 mEq/L   Potassium 4.0  3.7 - 5.3 mEq/L   Chloride 102  96 - 112 mEq/L   CO2 18 (*) 19 - 32 mEq/L   Glucose, Bld 162 (*) 70 - 99 mg/dL   BUN 20  6 - 23 mg/dL   Creatinine, Ser 0.95  0.50 - 1.35 mg/dL   Calcium 9.8  8.4 - 10.5 mg/dL   Total Protein 7.1  6.0 - 8.3 g/dL   Albumin 4.1  3.5 - 5.2 g/dL   AST 30  0 - 37 U/L   ALT 34  0 - 53 U/L   Alkaline Phosphatase 57  39 - 117 U/L   Total Bilirubin 1.1  0.3 - 1.2 mg/dL   GFR calc non Af Amer 84 (*) >90 mL/min   GFR calc Af Amer >90  >90 mL/min   Comment: (NOTE)     The eGFR has been calculated using the CKD EPI equation.     This calculation has not been validated in all clinical situations.     eGFR's persistently <90 mL/min signify possible Chronic Kidney     Disease.   Anion gap 17 (*) 5 - 15  LIPASE, BLOOD     Status: None   Collection Time    04/20/14  8:32 PM      Result Value Ref Range   Lipase 22  11 - 59 U/L  URINALYSIS, ROUTINE W REFLEX MICROSCOPIC     Status: Abnormal   Collection Time     04/21/14  1:05 AM      Result Value Ref Range   Color, Urine AMBER (*) YELLOW   Comment: BIOCHEMICALS MAY BE AFFECTED BY COLOR   APPearance CLOUDY (*) CLEAR   Specific Gravity, Urine 1.025  1.005 - 1.030   pH 5.0  5.0 - 8.0   Glucose, UA NEGATIVE  NEGATIVE mg/dL   Hgb urine dipstick NEGATIVE  NEGATIVE   Bilirubin Urine SMALL (*) NEGATIVE   Ketones, ur NEGATIVE  NEGATIVE mg/dL   Protein, ur NEGATIVE  NEGATIVE mg/dL   Urobilinogen, UA 0.2  0.0 - 1.0 mg/dL   Nitrite NEGATIVE  NEGATIVE   Leukocytes, UA NEGATIVE  NEGATIVE   Comment: MICROSCOPIC NOT DONE ON URINES WITH NEGATIVE PROTEIN, BLOOD, LEUKOCYTES, NITRITE, OR GLUCOSE <1000 mg/dL.   Ct Abdomen Pelvis W Contrast  04/21/2014   CLINICAL DATA:  RIGHT lower quadrant pain to palpation; assess for appendicitis.  EXAM: CT ABDOMEN AND PELVIS WITH CONTRAST  TECHNIQUE: Multidetector CT imaging of the abdomen and pelvis was performed using the standard protocol following bolus administration of intravenous contrast.  CONTRAST:  111m OMNIPAQUE IOHEXOL 300 MG/ML  SOLN  COMPARISON:  Pelvic radiograph January 22, 2014  FINDINGS: LUNG BASES: Included view of the lung bases are clear. The heart is mildly enlarged. Partially imaged coronary artery calcifications. Included pericardium is unremarkable.  SOLID ORGANS: The liver is diffusely hypodense, consistent with steatosis, prominent LEFT lobe of the liver. Spleen, gallbladder and adrenal glands are unremarkable. Mildly atrophic pancreas.  GASTROINTESTINAL TRACT: Small hiatal hernia. Contracted stomach results and thickened appearance of the walls. The small  and large bowel are normal in course and caliber without inflammatory changes. The appendix is a large, 12 mm with punctate central appendicolith, surrounding periappendiceal inflammation.  KIDNEYS/ URINARY TRACT: Kidneys are orthotopic, demonstrating symmetric enhancement. No nephrolithiasis, hydronephrosis or solid renal masses. 2.3 cm exophytic RIGHT upper  pole nonenhancing cyst. Too small to characterize RIGHT upper pole hypodensity. The unopacified ureters are normal in course and caliber. Delayed imaging through the kidneys demonstrates symmetric prompt contrast excretion within the proximal urinary collecting system. Urinary bladder is partially distended and unremarkable.  PERITONEUM/RETROPERITONEUM: No intraperitoneal free fluid nor free air. Aortoiliac vessels are normal in caliber, tortuous in course with mild calcific atherosclerosis No lymphadenopathy by CT size criteria. Prostate is enlarged, 5.2 x 5.7 cm.  SOFT TISSUE/OSSEOUS STRUCTURES: Nonsuspicious. Moderate to large bilateral fat containing inguinal hernias. Moderate lumbar spondylosis, resulting in moderate to severe L3-4, L4-5 neural foraminal narrowing.  IMPRESSION: Acute uncomplicated appendicitis.  Hepatic steatosis and borderline hepatomegaly.  Acute findings discussed with and reconfirmed by Dr.DAVID YAO on9/15/2015at1:11 am.   Electronically Signed   By: Elon Alas   On: 04/21/2014 01:12    Review of Systems  Constitutional: Negative for fever and chills.  Respiratory: Positive for shortness of breath.   Gastrointestinal: Positive for abdominal pain. Negative for nausea and vomiting.  All other systems reviewed and are negative.   Blood pressure 139/70, pulse 80, temperature 99.4 F (37.4 C), temperature source Oral, resp. rate 18, height 6' 1"  (1.854 m), weight 110.043 kg (242 lb 9.6 oz), SpO2 98.00%. Physical Exam  Vitals reviewed. Constitutional: He is oriented to person, place, and time. He appears well-developed and well-nourished.  HENT:  Head: Normocephalic and atraumatic.  Eyes: Conjunctivae and EOM are normal. Pupils are equal, round, and reactive to light.  Neck: Normal range of motion. Neck supple.  Cardiovascular: Normal rate, regular rhythm, normal heart sounds and intact distal pulses.   No murmur heard. Respiratory: Effort normal and breath sounds  normal.  GI: Soft. Bowel sounds are normal. He exhibits distension. He exhibits no mass. There is tenderness in the right lower quadrant. There is rebound and guarding.    Musculoskeletal: Normal range of motion. He exhibits no edema and no tenderness.  Neurological: He is alert and oriented to person, place, and time. He has normal reflexes.  Skin: Skin is warm and dry.  Psychiatric: He has a normal mood and affect. His behavior is normal. Judgment and thought content normal.     Assessment/Plan Likely acute appendicitis without rupture.  The appendix seems to course laterally near the base, then inferiomedially.  Does not appear to be ruptured on CT  Patient started on Rocephin and Flagyl.  Will go to the OR this AM with Dr. Donne Hazel of the Acute Care surgical service.  Shortness of breath documented in the ROS secondary to recently diagnosed sleep apnea which is still ungoing workup.  Donisha Hoch, JAY 04/21/2014, 5:01 AM

## 2014-04-22 ENCOUNTER — Encounter (HOSPITAL_COMMUNITY): Payer: Self-pay | Admitting: General Practice

## 2014-04-22 LAB — GLUCOSE, CAPILLARY
GLUCOSE-CAPILLARY: 115 mg/dL — AB (ref 70–99)
GLUCOSE-CAPILLARY: 140 mg/dL — AB (ref 70–99)
GLUCOSE-CAPILLARY: 128 mg/dL — AB (ref 70–99)
Glucose-Capillary: 114 mg/dL — ABNORMAL HIGH (ref 70–99)
Glucose-Capillary: 114 mg/dL — ABNORMAL HIGH (ref 70–99)
Glucose-Capillary: 120 mg/dL — ABNORMAL HIGH (ref 70–99)

## 2014-04-22 MED ORDER — KETOROLAC TROMETHAMINE 15 MG/ML IJ SOLN
15.0000 mg | Freq: Once | INTRAMUSCULAR | Status: AC
  Administered 2014-04-22: 15 mg via INTRAVENOUS
  Filled 2014-04-22: qty 1

## 2014-04-22 MED ORDER — KCL IN DEXTROSE-NACL 20-5-0.45 MEQ/L-%-% IV SOLN
INTRAVENOUS | Status: DC
  Administered 2014-04-22 (×2): via INTRAVENOUS
  Filled 2014-04-22 (×4): qty 1000

## 2014-04-22 NOTE — Care Management Note (Signed)
    Page 1 of 2   05/04/2014     12:19:25 PM CARE MANAGEMENT NOTE 05/04/2014  Patient:  Perry [name], Perry [name]   Account Number:  [id number]  Date Initiated:  04/22/2014  Documentation initiated by:  Magdalen Spatz  Subjective/Objective Assessment:     Action/Plan:   Anticipated DC Date:     Anticipated DC Plan:        [place]  CM consult      [facility] Choice  HOME HEALTH   Choice offered to / List presented to:  C-1 Patient        [place] arranged  HH-1 RN      [place].   Status of service:  Completed, signed off Medicare Important Message given?  YES (If response is "NO", the following Medicare IM given date fields will be blank) Date Medicare IM given:  04/22/2014 Medicare IM given by:  Magdalen Spatz Date Additional Medicare IM given:  05/04/2014 Additional Medicare IM given by:  Marvetta Gibbons  Discharge Disposition:  [place]  Per UR Regulation:  Reviewed for med. necessity/level of care/duration of stay  If discussed at [place] of Stay Meetings, dates discussed:   04/28/2014  04/30/2014    Comments:  05/04/14- 1200- Marvetta Gibbons RN, BSN [phone] Pt for d/c today- order in for [place] for drain care- spoke with pt at bedside- choice for [facility] given to pt for [facility]- per pt choice will use [facility] for [facility] services- referral called to [place] with [facility] for [place] for drain care- services to begin within 24-48 hr post discharge. additional copy of Medicare IM given to pt.   04/28/14- 1100- Marvetta Gibbons RN,  BSN [phone] Received pt as tx from [facility] on 04/27/14- pt POD 7, s/p lap appendectomy for perforated appendicitis with RLQ abscess with presumed leak  percutan. drain placed on 04/27/14 - per MD note- Will hold off on return to OR at least for today and continue to follow the content and character of the drain.  If this changes, the patient will likely need a right colectomy.  Cont TNA and NPO.  Await bowel function and await  resolution of leak, cont. IV abx and IV cardizem gtt.  NCM to follow for d/c needs.

## 2014-04-22 NOTE — Progress Notes (Signed)
1 Day Post-Op  Subjective: Sore today, no flatus, no n/v, voiding fine  Objective: Vital signs in last 24 hours: Temp:  [97.6 F (36.4 C)-99.2 F (37.3 C)] 98.2 F (36.8 C) (09/16 0900) Pulse Rate:  [69-85] 69 (09/16 0444) Resp:  [18-20] 18 (09/16 0900) BP: (112-126)/(49-91) 126/71 mmHg (09/16 0900) SpO2:  [91 %-97 %] 94 % (09/16 0900) FiO2 (%):  [2 %] 2 % (09/15 1240) Last BM Date: 04/20/14  Intake/Output from previous day: 09/15 0701 - 09/16 0700 In: 2802 [P.O.:460; I.V.:2342] Out: 25 [Blood:25] Intake/Output this shift:    General appearance: no distress Resp: clear to auscultation bilaterally Cardio: regular rate and rhythm GI: soft, few bs, incisions clean  Lab Results:   Recent Labs  04/20/14 2032  WBC 13.0*  HGB 14.5  HCT 40.2  PLT 181   BMET  Recent Labs  04/20/14 2032  NA 137  K 4.0  CL 102  CO2 18*  GLUCOSE 162*  BUN 20  CREATININE 0.95  CALCIUM 9.8   PT/INR No results found for this basename: LABPROT, INR,  in the last 72 hours ABG No results found for this basename: PHART, PCO2, PO2, HCO3,  in the last 72 hours  Studies/Results: Ct Abdomen Pelvis W Contrast  04/21/2014   CLINICAL DATA:  RIGHT lower quadrant pain to palpation; assess for appendicitis.  EXAM: CT ABDOMEN AND PELVIS WITH CONTRAST  TECHNIQUE: Multidetector CT imaging of the abdomen and pelvis was performed using the standard protocol following bolus administration of intravenous contrast.  CONTRAST:  179mL OMNIPAQUE IOHEXOL 300 MG/ML  SOLN  COMPARISON:  Pelvic radiograph January 22, 2014  FINDINGS: LUNG BASES: Included view of the lung bases are clear. The heart is mildly enlarged. Partially imaged coronary artery calcifications. Included pericardium is unremarkable.  SOLID ORGANS: The liver is diffusely hypodense, consistent with steatosis, prominent LEFT lobe of the liver. Spleen, gallbladder and adrenal glands are unremarkable. Mildly atrophic pancreas.  GASTROINTESTINAL TRACT: Small  hiatal hernia. Contracted stomach results and thickened appearance of the walls. The small and large bowel are normal in course and caliber without inflammatory changes. The appendix is a large, 12 mm with punctate central appendicolith, surrounding periappendiceal inflammation.  KIDNEYS/ URINARY TRACT: Kidneys are orthotopic, demonstrating symmetric enhancement. No nephrolithiasis, hydronephrosis or solid renal masses. 2.3 cm exophytic RIGHT upper pole nonenhancing cyst. Too small to characterize RIGHT upper pole hypodensity. The unopacified ureters are normal in course and caliber. Delayed imaging through the kidneys demonstrates symmetric prompt contrast excretion within the proximal urinary collecting system. Urinary bladder is partially distended and unremarkable.  PERITONEUM/RETROPERITONEUM: No intraperitoneal free fluid nor free air. Aortoiliac vessels are normal in caliber, tortuous in course with mild calcific atherosclerosis No lymphadenopathy by CT size criteria. Prostate is enlarged, 5.2 x 5.7 cm.  SOFT TISSUE/OSSEOUS STRUCTURES: Nonsuspicious. Moderate to large bilateral fat containing inguinal hernias. Moderate lumbar spondylosis, resulting in moderate to severe L3-4, L4-5 neural foraminal narrowing.  IMPRESSION: Acute uncomplicated appendicitis.  Hepatic steatosis and borderline hepatomegaly.  Acute findings discussed with and reconfirmed by Dr.DAVID YAO on9/15/2015at1:11 am.   Electronically Signed   By: Elon Alas   On: 04/21/2014 01:12    Anti-infectives: Anti-infectives   Start     Dose/Rate Route Frequency Ordered Stop   04/22/14 0300  cefTRIAXone (ROCEPHIN) 1 g in dextrose 5 % 50 mL IVPB     1 g 100 mL/hr over 30 Minutes Intravenous Every 24 hours 04/21/14 0514     04/21/14 1200  metroNIDAZOLE (FLAGYL)  IVPB 500 mg     500 mg 100 mL/hr over 60 Minutes Intravenous Every 8 hours 04/21/14 0514     04/21/14 0130  cefTRIAXone (ROCEPHIN) 1 g in dextrose 5 % 50 mL IVPB     1 g 100  mL/hr over 30 Minutes Intravenous  Once 04/21/14 0120 04/21/14 0347   04/21/14 0130  metroNIDAZOLE (FLAGYL) IVPB 500 mg     500 mg 100 mL/hr over 60 Minutes Intravenous  Once 04/21/14 0120 04/21/14 0509      Assessment/Plan: POD 1 lap appy, perforated appendicitis  1. Po pain meds with iv backup 2. Oob. pulm toilet 3. Cont iv abx, will do total 5 days abx, transition to po at discharge 4. Lovenox, scds 5. Home when tol po, having some bowel function  [facility] 04/22/2014

## 2014-04-23 ENCOUNTER — Inpatient Hospital Stay (HOSPITAL_COMMUNITY): Payer: Medicare Other

## 2014-04-23 ENCOUNTER — Encounter (HOSPITAL_COMMUNITY): Payer: Self-pay | Admitting: General Surgery

## 2014-04-23 LAB — COMPREHENSIVE METABOLIC PANEL
ALT: 16 U/L (ref 0–53)
ANION GAP: 15 (ref 5–15)
AST: 18 U/L (ref 0–37)
Albumin: 2.8 g/dL — ABNORMAL LOW (ref 3.5–5.2)
Alkaline Phosphatase: 42 U/L (ref 39–117)
BILIRUBIN TOTAL: 0.7 mg/dL (ref 0.3–1.2)
BUN: 25 mg/dL — AB (ref 6–23)
CHLORIDE: 99 meq/L (ref 96–112)
CO2: 20 meq/L (ref 19–32)
CREATININE: 1.04 mg/dL (ref 0.50–1.35)
Calcium: 8.9 mg/dL (ref 8.4–10.5)
GFR, EST AFRICAN AMERICAN: 83 mL/min — AB (ref >90)
GFR, EST NON AFRICAN AMERICAN: 72 mL/min — AB (ref >90)
GLUCOSE: 153 mg/dL — AB (ref 70–99)
Potassium: 3.9 mEq/L (ref 3.7–5.3)
Sodium: 134 mEq/L — ABNORMAL LOW (ref 137–147)
Total Protein: 6.3 g/dL (ref 6.0–8.3)

## 2014-04-23 LAB — CBC
HCT: 36.3 % — ABNORMAL LOW (ref 39.0–52.0)
Hemoglobin: 12.5 g/dL — ABNORMAL LOW (ref 13.0–17.0)
MCH: 31 pg (ref 26.0–34.0)
MCHC: 34.4 g/dL (ref 30.0–36.0)
MCV: 90.1 fL (ref 78.0–100.0)
Platelets: 135 10*3/uL — ABNORMAL LOW (ref 150–400)
RBC: 4.03 MIL/uL — ABNORMAL LOW (ref 4.22–5.81)
RDW: 13.3 % (ref 11.5–15.5)
WBC: 7 10*3/uL (ref 4.0–10.5)

## 2014-04-23 LAB — GLUCOSE, CAPILLARY
GLUCOSE-CAPILLARY: 135 mg/dL — AB (ref 70–99)
Glucose-Capillary: 143 mg/dL — ABNORMAL HIGH (ref 70–99)
Glucose-Capillary: 150 mg/dL — ABNORMAL HIGH (ref 70–99)
Glucose-Capillary: 149 mg/dL — ABNORMAL HIGH (ref 70–99)
Glucose-Capillary: 131 mg/dL — ABNORMAL HIGH (ref 70–99)

## 2014-04-23 MED ORDER — MORPHINE SULFATE 2 MG/ML IJ SOLN
2.0000 mg | INTRAMUSCULAR | Status: DC | PRN
  Administered 2014-04-23 – 2014-04-26 (×8): 2 mg via INTRAVENOUS
  Filled 2014-04-23 (×8): qty 1

## 2014-04-23 MED ORDER — PHENOL 1.4 % MT LIQD
1.0000 | OROMUCOSAL | Status: DC | PRN
  Administered 2014-04-23: 1 via OROMUCOSAL
  Filled 2014-04-23: qty 177

## 2014-04-23 MED ORDER — KETOROLAC TROMETHAMINE 15 MG/ML IJ SOLN
15.0000 mg | Freq: Once | INTRAMUSCULAR | Status: AC
  Administered 2014-04-23: 15 mg via INTRAVENOUS
  Filled 2014-04-23: qty 1

## 2014-04-23 MED ORDER — ACETAMINOPHEN 325 MG PO TABS
650.0000 mg | ORAL_TABLET | ORAL | Status: DC | PRN
  Administered 2014-04-23 – 2014-04-26 (×2): 650 mg via ORAL
  Filled 2014-04-23 (×2): qty 2

## 2014-04-23 MED ORDER — POTASSIUM CHLORIDE 2 MEQ/ML IV SOLN
INTRAVENOUS | Status: DC
  Administered 2014-04-23 – 2014-04-25 (×5): via INTRAVENOUS
  Filled 2014-04-23 (×11): qty 1000

## 2014-04-23 MED ORDER — SIMETHICONE 40 MG/0.6ML PO SUSP
40.0000 mg | Freq: Three times a day (TID) | ORAL | Status: DC | PRN
  Administered 2014-04-23: 40 mg via ORAL
  Filled 2014-04-23 (×2): qty 0.6

## 2014-04-23 NOTE — Progress Notes (Signed)
2 Days Post-Op  Subjective: Bloating, no flatus, bm, nausea no emesis, walking alot  Objective: Vital signs in last 24 hours: Temp:  [97.9 F (36.6 C)-101.5 F (38.6 C)] 98.4 F (36.9 C) (09/17 0546) Pulse Rate:  [58-111] 76 (09/17 0546) Resp:  [17-22] 22 (09/17 0546) BP: (112-148)/(67-105) 118/87 mmHg (09/17 0546) SpO2:  [94 %-96 %] 96 % (09/17 0546) Last BM Date: 04/20/14  Intake/Output from previous day: 09/16 0701 - 09/17 0700 In: 2920 [P.O.:1260; I.V.:1660] Out: -  Intake/Output this shift:    General appearance: no distress Resp: clear to auscultation bilaterally Cardio: regular rate and rhythm GI: distended, approp tender, no bs, wounds clean  Lab Results:   Recent Labs  04/20/14 2032 04/23/14 0410  WBC 13.0* 7.0  HGB 14.5 12.5*  HCT 40.2 36.3*  PLT 181 135*   BMET  Recent Labs  04/20/14 2032 04/23/14 0410  NA 137 134*  K 4.0 3.9  CL 102 99  CO2 18* 20  GLUCOSE 162* 153*  BUN 20 25*  CREATININE 0.95 1.04  CALCIUM 9.8 8.9    Anti-infectives: Anti-infectives   Start     Dose/Rate Route Frequency Ordered Stop   04/22/14 0300  cefTRIAXone (ROCEPHIN) 1 g in dextrose 5 % 50 mL IVPB     1 g 100 mL/hr over 30 Minutes Intravenous Every 24 hours 04/21/14 0514     04/21/14 1200  metroNIDAZOLE (FLAGYL) IVPB 500 mg     500 mg 100 mL/hr over 60 Minutes Intravenous Every 8 hours 04/21/14 0514     04/21/14 0130  cefTRIAXone (ROCEPHIN) 1 g in dextrose 5 % 50 mL IVPB     1 g 100 mL/hr over 30 Minutes Intravenous  Once 04/21/14 0120 04/21/14 0347   04/21/14 0130  metroNIDAZOLE (FLAGYL) IVPB 500 mg     500 mg 100 mL/hr over 60 Minutes Intravenous  Once 04/21/14 0120 04/21/14 0509      Assessment/Plan: POD 2 lap appy, perforated appendicitis  Ileus 1. IV pain meds  2. Oob. pulm toilet  3. Cont iv abx, will do total 5 days abx, transition to po at discharge  4. Lovenox, scds  5. Will place ng today due to distention, repeat films am, not unexpected  given operative findings   [facility] 04/23/2014

## 2014-04-24 ENCOUNTER — Inpatient Hospital Stay (HOSPITAL_COMMUNITY): Payer: Medicare Other

## 2014-04-24 LAB — GLUCOSE, CAPILLARY
GLUCOSE-CAPILLARY: 110 mg/dL — AB (ref 70–99)
GLUCOSE-CAPILLARY: 113 mg/dL — AB (ref 70–99)
GLUCOSE-CAPILLARY: 118 mg/dL — AB (ref 70–99)
GLUCOSE-CAPILLARY: 127 mg/dL — AB (ref 70–99)
Glucose-Capillary: 142 mg/dL — ABNORMAL HIGH (ref 70–99)
Glucose-Capillary: 123 mg/dL — ABNORMAL HIGH (ref 70–99)
Glucose-Capillary: 130 mg/dL — ABNORMAL HIGH (ref 70–99)

## 2014-04-24 MED ORDER — KETOROLAC TROMETHAMINE 30 MG/ML IJ SOLN
15.0000 mg | Freq: Three times a day (TID) | INTRAMUSCULAR | Status: AC | PRN
  Administered 2014-04-24 – 2014-04-25 (×2): 15 mg via INTRAVENOUS
  Filled 2014-04-24 (×2): qty 1

## 2014-04-24 MED ORDER — METOPROLOL TARTRATE 1 MG/ML IV SOLN
5.0000 mg | Freq: Four times a day (QID) | INTRAVENOUS | Status: DC
  Administered 2014-04-24 – 2014-05-03 (×29): 5 mg via INTRAVENOUS
  Filled 2014-04-24 (×41): qty 5

## 2014-04-24 NOTE — Progress Notes (Signed)
Patient ID: Perry [name], male   DOB: 06-07-1946, 68 y.o.   MRN: [id number] 3 Days Post-Op  Subjective: Pt feels like "crap."  He is passing some flatus and had a small BM, but still very bloated.  Objective: Vital signs in last 24 hours: Temp:  [97.9 F (36.6 C)-98.6 F (37 C)] 98.2 F (36.8 C) (09/18 0519) Pulse Rate:  [91-115] 91 (09/18 0519) Resp:  [20] 20 (09/17 1342) BP: (126-142)/(75-91) 128/75 mmHg (09/18 0519) SpO2:  [93 %-95 %] 94 % (09/18 0519) Last BM Date: 04/24/14  Intake/Output from previous day: 09/17 0701 - 09/18 0700 In: 1525 [I.V.:1525] Out: 900 [Urine:200; Emesis/NG output:700] Intake/Output this shift: Total I/O In: 0  Out: 250 [Emesis/NG output:250]  PE: Abd: still distended and bloated, minimal out of NGT, just chloraseptic spray, quiet, incisions c/d/i with ecchymosis around umbilical and LLQ incisions Heart: regular  Lab Results:   Recent Labs  04/23/14 0410  WBC 7.0  HGB 12.5*  HCT 36.3*  PLT 135*   BMET  Recent Labs  04/23/14 0410  NA 134*  K 3.9  CL 99  CO2 20  GLUCOSE 153*  BUN 25*  CREATININE 1.04  CALCIUM 8.9   PT/INR No results found for this basename: LABPROT, INR,  in the last 72 hours CMP     Component Value Date/Time   NA 134* 04/23/2014 0410   K 3.9 04/23/2014 0410   CL 99 04/23/2014 0410   CO2 20 04/23/2014 0410   GLUCOSE 153* 04/23/2014 0410   BUN 25* 04/23/2014 0410   CREATININE 1.04 04/23/2014 0410   CALCIUM 8.9 04/23/2014 0410   PROT 6.3 04/23/2014 0410   ALBUMIN 2.8* 04/23/2014 0410   AST 18 04/23/2014 0410   ALT 16 04/23/2014 0410   ALKPHOS 42 04/23/2014 0410   BILITOT 0.7 04/23/2014 0410   GFRNONAA 72* 04/23/2014 0410   GFRAA 83* 04/23/2014 0410   Lipase     Component Value Date/Time   LIPASE 22 04/20/2014 2032       Studies/Results: Dg Abd 1 View  04/24/2014   CLINICAL DATA:  Ileus.  EXAM: ABDOMEN - 1 VIEW  COMPARISON:  April 23, 2014.  FINDINGS: Residual contrast seen in right colon on prior exam  has now progressed to the more distal colon and rectum. There remains dilated small bowel loops which are unchanged compared to prior exam consistent with ileus or possibly partial distal small bowel obstruction. Nasogastric tube tip is seen in expected position of proximal stomach.  IMPRESSION: Stable dilated small bowel loops are noted concerning for ileus or possibly partial distal small bowel obstruction. Continued radiographic follow-up is recommended.   Electronically Signed   By: Sabino Dick M.D.   On: 04/24/2014 08:15   Dg Abd Portable 1v  04/23/2014   CLINICAL DATA:  Abdominal distention.  EXAM: PORTABLE ABDOMEN - 1 VIEW  COMPARISON:  CT scan of April 21, 2014.  FINDINGS: Residual contrast is noted within the right colon. Mildly dilated small bowel loops are noted concerning for ileus or possibly distal small bowel obstruction. Mild degenerative changes are noted in the lumbar spine.  IMPRESSION: Mildly dilated small bowel loops are noted concerning for ileus or possibly distal small bowel obstruction. Followup radiographs are recommended.   Electronically Signed   By: Sabino Dick M.D.   On: 04/23/2014 08:03    Anti-infectives: Anti-infectives   Start     Dose/Rate Route Frequency Ordered Stop   04/22/14 0300  cefTRIAXone (ROCEPHIN) 1 g  in dextrose 5 % 50 mL IVPB     1 g 100 mL/hr over 30 Minutes Intravenous Every 24 hours 04/21/14 0514     04/21/14 1200  metroNIDAZOLE (FLAGYL) IVPB 500 mg     500 mg 100 mL/hr over 60 Minutes Intravenous Every 8 hours 04/21/14 0514     04/21/14 0130  cefTRIAXone (ROCEPHIN) 1 g in dextrose 5 % 50 mL IVPB     1 g 100 mL/hr over 30 Minutes Intravenous  Once 04/21/14 0120 04/21/14 0347   04/21/14 0130  metroNIDAZOLE (FLAGYL) IVPB 500 mg     500 mg 100 mL/hr over 60 Minutes Intravenous  Once 04/21/14 0120 04/21/14 0509       Assessment/Plan  POD 3 lap appy, perforated appendicitis  Ileus HTN  Plan: 1. Abdominal film this am for NGT placement  still shows significant ileus despite passing some flatus and BM.  Continue NGT management 2. Converted HTN meds to IV and help POs 3. Cont to mobilize and pulm toilet 4. Lovenox, SCDs 5. Cont IV abx therapy, Rocephin/Flagyl D3/5    LOS: 4 days    Trotti,Chardonnay Holzmann E 04/24/2014, 11:06 AM Pager: [phone]

## 2014-04-24 NOTE — Progress Notes (Signed)
Seen, agree with above.  Bloated.    NPO/NGT

## 2014-04-25 LAB — CBC
HEMATOCRIT: 37.4 % — AB (ref 39.0–52.0)
Hemoglobin: 12.9 g/dL — ABNORMAL LOW (ref 13.0–17.0)
MCH: 31.1 pg (ref 26.0–34.0)
MCHC: 34.5 g/dL (ref 30.0–36.0)
MCV: 90.1 fL (ref 78.0–100.0)
Platelets: 181 10*3/uL (ref 150–400)
RBC: 4.15 MIL/uL — ABNORMAL LOW (ref 4.22–5.81)
RDW: 13.4 % (ref 11.5–15.5)
WBC: 6 10*3/uL (ref 4.0–10.5)

## 2014-04-25 LAB — GLUCOSE, CAPILLARY
GLUCOSE-CAPILLARY: 99 mg/dL (ref 70–99)
Glucose-Capillary: 121 mg/dL — ABNORMAL HIGH (ref 70–99)
Glucose-Capillary: 130 mg/dL — ABNORMAL HIGH (ref 70–99)
Glucose-Capillary: 115 mg/dL — ABNORMAL HIGH (ref 70–99)
Glucose-Capillary: 105 mg/dL — ABNORMAL HIGH (ref 70–99)

## 2014-04-25 LAB — BASIC METABOLIC PANEL
Anion gap: 14 (ref 5–15)
BUN: 28 mg/dL — ABNORMAL HIGH (ref 6–23)
CO2: 18 mEq/L — ABNORMAL LOW (ref 19–32)
CREATININE: 0.85 mg/dL (ref 0.50–1.35)
Calcium: 9.1 mg/dL (ref 8.4–10.5)
Chloride: 109 mEq/L (ref 96–112)
GFR, EST NON AFRICAN AMERICAN: 88 mL/min — AB (ref >90)
Glucose, Bld: 122 mg/dL — ABNORMAL HIGH (ref 70–99)
POTASSIUM: 4 meq/L (ref 3.7–5.3)
Sodium: 141 mEq/L (ref 137–147)

## 2014-04-25 MED ORDER — KETOROLAC TROMETHAMINE 15 MG/ML IJ SOLN
15.0000 mg | Freq: Three times a day (TID) | INTRAMUSCULAR | Status: DC
  Administered 2014-04-25 – 2014-04-26 (×3): 15 mg via INTRAVENOUS
  Filled 2014-04-25 (×6): qty 1

## 2014-04-25 MED ORDER — BISACODYL 10 MG RE SUPP
10.0000 mg | Freq: Two times a day (BID) | RECTAL | Status: AC
  Administered 2014-04-25 (×2): 10 mg via RECTAL
  Filled 2014-04-25 (×2): qty 1

## 2014-04-25 NOTE — Progress Notes (Addendum)
4 Days Post-Op  Subjective: Feels a little better but not great. Passing minimal flatus. His had 3 or 4 tiny liquid stools. He still feels bloated. Denies nausea. Toradol helps pain. He is ambulating a lot Hemoglobin 12.9. WBC 6000. Potassium 4.0. BUN 28. Creatinine 0.85.  Objective: Vital signs in last 24 hours: Temp:  [97.3 F (36.3 C)-97.8 F (36.6 C)] 97.3 F (36.3 C) (09/19 0505) Pulse Rate:  [74-95] 95 (09/19 0505) Resp:  [18-20] 20 (09/19 0505) BP: (124-153)/(75-95) 153/95 mmHg (09/19 0505) SpO2:  [96 %-99 %] 99 % (09/19 0505) Last BM Date: 04/24/14  Intake/Output from previous day: 09/18 0701 - 09/19 0700 In: 2611.7 [I.V.:2581.7; NG/GT:30] Out: 1225 [Urine:500; Emesis/NG output:725] Intake/Output this shift:    General appearance: Alert. Oriented. Mental status normal. Does not look toxic or ill. Ambulating without much difficulty. Resp: clear to auscultation bilaterally GI: soft. Distended. Silent Wounds looked fine. Some ecchymoses but no hematoma.  Lab Results:  Results for orders placed during the hospital encounter of 04/20/14 (from the past 24 hour(s))  GLUCOSE, CAPILLARY     Status: Abnormal   Collection Time    04/24/14 11:29 AM      Result Value Ref Range   Glucose-Capillary 113 (*) 70 - 99 mg/dL  GLUCOSE, CAPILLARY     Status: Abnormal   Collection Time    04/24/14  4:44 PM      Result Value Ref Range   Glucose-Capillary 123 (*) 70 - 99 mg/dL  GLUCOSE, CAPILLARY     Status: Abnormal   Collection Time    04/24/14  7:33 PM      Result Value Ref Range   Glucose-Capillary 130 (*) 70 - 99 mg/dL   Comment 1 Notify RN    GLUCOSE, CAPILLARY     Status: Abnormal   Collection Time    04/24/14 11:34 PM      Result Value Ref Range   Glucose-Capillary 118 (*) 70 - 99 mg/dL   Comment 1 Notify RN    CBC     Status: Abnormal   Collection Time    04/25/14 12:49 AM      Result Value Ref Range   WBC 6.0  4.0 - 10.5 K/uL   RBC 4.15 (*) 4.22 - 5.81 MIL/uL   Hemoglobin 12.9 (*) 13.0 - 17.0 g/dL   HCT 37.4 (*) 39.0 - 52.0 %   MCV 90.1  78.0 - 100.0 fL   MCH 31.1  26.0 - 34.0 pg   MCHC 34.5  30.0 - 36.0 g/dL   RDW 13.4  11.5 - 15.5 %   Platelets 181  150 - 400 K/uL  BASIC METABOLIC PANEL     Status: Abnormal   Collection Time    04/25/14 12:49 AM      Result Value Ref Range   Sodium 141  137 - 147 mEq/L   Potassium 4.0  3.7 - 5.3 mEq/L   Chloride 109  96 - 112 mEq/L   CO2 18 (*) 19 - 32 mEq/L   Glucose, Bld 122 (*) 70 - 99 mg/dL   BUN 28 (*) 6 - 23 mg/dL   Creatinine, Ser 0.85  0.50 - 1.35 mg/dL   Calcium 9.1  8.4 - 10.5 mg/dL   GFR calc non Af Amer 88 (*) >90 mL/min   GFR calc Af Amer >90  >90 mL/min   Anion gap 14  5 - 15  GLUCOSE, CAPILLARY     Status: None   Collection Time  04/25/14  3:36 AM      Result Value Ref Range   Glucose-Capillary 99  70 - 99 mg/dL   Comment 1 Notify RN    GLUCOSE, CAPILLARY     Status: Abnormal   Collection Time    04/25/14  7:46 AM      Result Value Ref Range   Glucose-Capillary 121 (*) 70 - 99 mg/dL     Studies/Results: Dg Abd 1 View  04/24/2014   CLINICAL DATA:  Ileus.  EXAM: ABDOMEN - 1 VIEW  COMPARISON:  April 23, 2014.  FINDINGS: Residual contrast seen in right colon on prior exam has now progressed to the more distal colon and rectum. There remains dilated small bowel loops which are unchanged compared to prior exam consistent with ileus or possibly partial distal small bowel obstruction. Nasogastric tube tip is seen in expected position of proximal stomach.  IMPRESSION: Stable dilated small bowel loops are noted concerning for ileus or possibly partial distal small bowel obstruction. Continued radiographic follow-up is recommended.   Electronically Signed   By: Sabino Dick M.D.   On: 04/24/2014 08:15    . bisacodyl  10 mg Rectal q12n4p  . cefTRIAXone (ROCEPHIN)  IV  1 g Intravenous Q24H  . enoxaparin (LOVENOX) injection  40 mg Subcutaneous Q24H  . insulin aspart  0-15 Units  Subcutaneous 6 times per day  . ketorolac  15 mg Intravenous 3 times per day  . metoprolol  5 mg Intravenous 4 times per day  . metronidazole  500 mg Intravenous Q8H  . mupirocin ointment  1 application Nasal BID  . pantoprazole (PROTONIX) IV  40 mg Intravenous QHS     Assessment/Plan: s/p Procedure(s): APPENDECTOMY LAPAROSCOPIC   POD  #4. Laparoscopic appendectomy for perforated appendicitis Stable. Continued ileus clinically Continue Rocephin and Flagyl Continue NG tube Continue toradol another 24 hours in hopes of avoiding narcotic Check bmet 9/20 Duplex suppositories  VTE proph. - lovenox CAD-stable HTN-controlled NIDDM - SSI - controlled  @PROBHOSP @  LOS: 5 days    Elvin Banker M 04/25/2014  . .prob

## 2014-04-25 NOTE — Progress Notes (Signed)
Pt has ambulated around unit 3 times and tolerated well.  Will continue to monitor. Syliva Overman

## 2014-04-26 ENCOUNTER — Inpatient Hospital Stay (HOSPITAL_COMMUNITY): Payer: Medicare Other

## 2014-04-26 ENCOUNTER — Encounter (HOSPITAL_COMMUNITY): Payer: Self-pay | Admitting: Radiology

## 2014-04-26 LAB — CBC WITH DIFFERENTIAL/PLATELET
Basophils Absolute: 0 10*3/uL (ref 0.0–0.1)
Basophils Relative: 0 % (ref 0–1)
Eosinophils Absolute: 0.1 10*3/uL (ref 0.0–0.7)
Eosinophils Relative: 1 % (ref 0–5)
HEMATOCRIT: 38.3 % — AB (ref 39.0–52.0)
HEMOGLOBIN: 12.8 g/dL — AB (ref 13.0–17.0)
LYMPHS ABS: 0.6 10*3/uL — AB (ref 0.7–4.0)
Lymphocytes Relative: 7 % — ABNORMAL LOW (ref 12–46)
MCH: 31.3 pg (ref 26.0–34.0)
MCHC: 33.4 g/dL (ref 30.0–36.0)
MCV: 93.6 fL (ref 78.0–100.0)
MONO ABS: 0.6 10*3/uL (ref 0.1–1.0)
Monocytes Relative: 7 % (ref 3–12)
NEUTROS ABS: 7.9 10*3/uL — AB (ref 1.7–7.7)
Neutrophils Relative %: 85 % — ABNORMAL HIGH (ref 43–77)
Platelets: 206 10*3/uL (ref 150–400)
RBC: 4.09 MIL/uL — AB (ref 4.22–5.81)
RDW: 13.7 % (ref 11.5–15.5)
WBC: 9.2 10*3/uL (ref 4.0–10.5)

## 2014-04-26 LAB — GLUCOSE, CAPILLARY
Glucose-Capillary: 104 mg/dL — ABNORMAL HIGH (ref 70–99)
Glucose-Capillary: 111 mg/dL — ABNORMAL HIGH (ref 70–99)
Glucose-Capillary: 117 mg/dL — ABNORMAL HIGH (ref 70–99)
Glucose-Capillary: 126 mg/dL — ABNORMAL HIGH (ref 70–99)
Glucose-Capillary: 141 mg/dL — ABNORMAL HIGH (ref 70–99)
Glucose-Capillary: 158 mg/dL — ABNORMAL HIGH (ref 70–99)
Glucose-Capillary: 96 mg/dL (ref 70–99)

## 2014-04-26 LAB — BASIC METABOLIC PANEL
Anion gap: 12 (ref 5–15)
BUN: 37 mg/dL — ABNORMAL HIGH (ref 6–23)
CALCIUM: 9.2 mg/dL (ref 8.4–10.5)
CO2: 22 mEq/L (ref 19–32)
Chloride: 114 mEq/L — ABNORMAL HIGH (ref 96–112)
Creatinine, Ser: 0.96 mg/dL (ref 0.50–1.35)
GFR calc Af Amer: >90 mL/min (ref >90)
GFR calc non Af Amer: 83 mL/min — ABNORMAL LOW (ref >90)
Glucose, Bld: 120 mg/dL — ABNORMAL HIGH (ref 70–99)
Potassium: 4.4 mEq/L (ref 3.7–5.3)
SODIUM: 148 meq/L — AB (ref 137–147)

## 2014-04-26 MED ORDER — HYDROMORPHONE HCL 1 MG/ML IJ SOLN
1.0000 mg | INTRAMUSCULAR | Status: DC | PRN
  Administered 2014-04-26 – 2014-04-27 (×3): 1 mg via INTRAVENOUS
  Administered 2014-04-27: 2 mg via INTRAVENOUS
  Administered 2014-04-27: 1 mg via INTRAVENOUS
  Administered 2014-04-27 – 2014-04-28 (×6): 2 mg via INTRAVENOUS
  Administered 2014-04-29: 1 mg via INTRAVENOUS
  Administered 2014-04-29 (×3): 2 mg via INTRAVENOUS
  Administered 2014-04-29 – 2014-05-01 (×12): 1 mg via INTRAVENOUS
  Administered 2014-05-01: 2 mg via INTRAVENOUS
  Administered 2014-05-02: 1 mg via INTRAVENOUS
  Administered 2014-05-02: 2 mg via INTRAVENOUS
  Filled 2014-04-26: qty 1
  Filled 2014-04-26 (×2): qty 2
  Filled 2014-04-26: qty 1
  Filled 2014-04-26: qty 2
  Filled 2014-04-26: qty 1
  Filled 2014-04-26 (×2): qty 2
  Filled 2014-04-26 (×3): qty 1
  Filled 2014-04-26: qty 2
  Filled 2014-04-26: qty 1
  Filled 2014-04-26 (×2): qty 2
  Filled 2014-04-26: qty 1
  Filled 2014-04-26: qty 2
  Filled 2014-04-26 (×4): qty 1
  Filled 2014-04-26: qty 2
  Filled 2014-04-26 (×3): qty 1
  Filled 2014-04-26 (×2): qty 2
  Filled 2014-04-26 (×3): qty 1

## 2014-04-26 MED ORDER — IOHEXOL 300 MG/ML  SOLN
25.0000 mL | INTRAMUSCULAR | Status: AC
  Administered 2014-04-26 (×2): 25 mL via ORAL

## 2014-04-26 MED ORDER — DEXTROSE-NACL 5-0.45 % IV SOLN
INTRAVENOUS | Status: AC
  Administered 2014-04-26 – 2014-04-27 (×3): via INTRAVENOUS
  Filled 2014-04-26 (×7): qty 1000

## 2014-04-26 MED ORDER — IOHEXOL 300 MG/ML  SOLN
100.0000 mL | Freq: Once | INTRAMUSCULAR | Status: AC | PRN
  Administered 2014-04-26: 100 mL via INTRAVENOUS

## 2014-04-26 MED ORDER — SODIUM CHLORIDE 0.9 % IV SOLN
1.0000 g | INTRAVENOUS | Status: DC
  Administered 2014-04-26 – 2014-04-28 (×3): 1 g via INTRAVENOUS
  Filled 2014-04-26 (×4): qty 1

## 2014-04-26 MED ORDER — CHLORHEXIDINE GLUCONATE 0.12 % MT SOLN
15.0000 mL | Freq: Two times a day (BID) | OROMUCOSAL | Status: DC
  Administered 2014-04-26 – 2014-05-04 (×16): 15 mL via OROMUCOSAL
  Filled 2014-04-26 (×20): qty 15

## 2014-04-26 MED ORDER — HYDROMORPHONE HCL 1 MG/ML IJ SOLN
INTRAMUSCULAR | Status: AC
  Filled 2014-04-26: qty 1

## 2014-04-26 MED ORDER — CETYLPYRIDINIUM CHLORIDE 0.05 % MT LIQD
7.0000 mL | Freq: Two times a day (BID) | OROMUCOSAL | Status: DC
  Administered 2014-04-26 – 2014-05-04 (×14): 7 mL via OROMUCOSAL

## 2014-04-26 NOTE — Progress Notes (Signed)
5 Days Post-Op  Subjective: Says he's passed minimal stool and minimal flatus, mostly just after the suppositories. His main complaint is increasing right lower quadrant pain. He says this is almost worse than the pain he had on admission NG output 750 cc per 24 hours.  Electrolytes abnormal with sodium 148, potassium 4.4, chloride 114, BUN 37, creatinine 0.96. GFR 83. Glucose 120.  Objective: Vital signs in last 24 hours: Temp:  [97.6 F (36.4 C)-98.3 F (36.8 C)] 98.3 F (36.8 C) (09/20 0601) Pulse Rate:  [86] 86 (09/20 0601) Resp:  [18-19] 19 (09/20 0601) BP: (133-152)/(76-92) 152/76 mmHg (09/20 0601) SpO2:  [98 %-99 %] 98 % (09/20 0601) Last BM Date: 04/24/14  Intake/Output from previous day: 09/19 0701 - 09/20 0700 In: 2610 [I.V.:2610] Out: 750 [Emesis/NG output:750] Intake/Output this shift:    General appearance: Alert. Cooperative. [place] is normal. Does not look toxic. Just concerned about his pain. Resp: clear to auscultation bilaterally GI: abdomen remains soft but distended and tympanitic. Hypoactive bowel sounds. Localized tenderness with guarding right lower quadrant. Wounds OK.  Lab Results:  Results for orders placed during the hospital encounter of 04/20/14 (from the past 24 hour(s))  GLUCOSE, CAPILLARY     Status: Abnormal   Collection Time    04/25/14 11:50 AM      Result Value Ref Range   Glucose-Capillary 130 (*) 70 - 99 mg/dL  GLUCOSE, CAPILLARY     Status: Abnormal   Collection Time    04/25/14  3:55 PM      Result Value Ref Range   Glucose-Capillary 115 (*) 70 - 99 mg/dL  GLUCOSE, CAPILLARY     Status: Abnormal   Collection Time    04/25/14  7:35 PM      Result Value Ref Range   Glucose-Capillary 105 (*) 70 - 99 mg/dL   Comment 1 Notify RN    GLUCOSE, CAPILLARY     Status: None   Collection Time    04/26/14 12:06 AM      Result Value Ref Range   Glucose-Capillary 96  70 - 99 mg/dL   Comment 1 Notify RN    GLUCOSE, CAPILLARY     Status:  Abnormal   Collection Time    04/26/14  4:03 AM      Result Value Ref Range   Glucose-Capillary 117 (*) 70 - 99 mg/dL  BASIC METABOLIC PANEL     Status: Abnormal   Collection Time    04/26/14  5:50 AM      Result Value Ref Range   Sodium 148 (*) 137 - 147 mEq/L   Potassium 4.4  3.7 - 5.3 mEq/L   Chloride 114 (*) 96 - 112 mEq/L   CO2 22  19 - 32 mEq/L   Glucose, Bld 120 (*) 70 - 99 mg/dL   BUN 37 (*) 6 - 23 mg/dL   Creatinine, Ser 0.96  0.50 - 1.35 mg/dL   Calcium 9.2  8.4 - 10.5 mg/dL   GFR calc non Af Amer 83 (*) >90 mL/min   GFR calc Af Amer >90  >90 mL/min   Anion gap 12  5 - 15  GLUCOSE, CAPILLARY     Status: Abnormal   Collection Time    04/26/14  7:49 AM      Result Value Ref Range   Glucose-Capillary 141 (*) 70 - 99 mg/dL     Studies/Results: No results found.  Marland Kitchen antiseptic oral rinse  7 mL Mouth Rinse q12n4p  .  cefTRIAXone (ROCEPHIN)  IV  1 g Intravenous Q24H  . chlorhexidine  15 mL Mouth Rinse BID  . enoxaparin (LOVENOX) injection  40 mg Subcutaneous Q24H  . insulin aspart  0-15 Units Subcutaneous 6 times per day  . metoprolol  5 mg Intravenous 4 times per day  . metronidazole  500 mg Intravenous Q8H  . pantoprazole (PROTONIX) IV  40 mg Intravenous QHS     Assessment/Plan: s/p Procedure(s): APPENDECTOMY LAPAROSCOPIC  POD #5. Laparoscopic appendectomy for perforated appendicitis  Stable, But I'm concerned about continued ileus, distention, and now with increasing right lower quadrant pain. Proceed with CT scan of abdomen and pelvis with contrast stat to rule out abscess. Continue Rocephin and Flagyl  Continue NG tube  Dc toradol  Hypernatremia  and azotemia. Suspect relative intravascular volume depletion Increase IV fluids to 175 cc per hour Labs tomorrow  VTE proph. - lovenox  CAD-stable  HTN-controlled  NIDDM - SSI - controlled   @PROBHOSP @  LOS: 6 days    Krista Godsil M 04/26/2014  . .prob

## 2014-04-26 NOTE — Progress Notes (Signed)
The patient has a RLQ abscess with possible contrast and air contained within.  Possible leak from either appendiceal stump or adjacent SB.  Will write for percutaneous drain tomorrow.  Will switch antibiotics to Invanz.  Kathryne Eriksson. Dahlia Bailiff, MD, [place] [phone] [facility]

## 2014-04-26 NOTE — Progress Notes (Signed)
As of 1745 CT scan of the abdomen and pelvis had not been done.  Kathryne Eriksson. Dahlia Bailiff, MD, [place] [phone] [facility]

## 2014-04-27 ENCOUNTER — Inpatient Hospital Stay (HOSPITAL_COMMUNITY): Payer: Medicare Other

## 2014-04-27 LAB — CBC
HCT: 41.8 % (ref 39.0–52.0)
HEMOGLOBIN: 14.2 g/dL (ref 13.0–17.0)
MCH: 31.7 pg (ref 26.0–34.0)
MCHC: 34 g/dL (ref 30.0–36.0)
MCV: 93.3 fL (ref 78.0–100.0)
Platelets: 202 10*3/uL (ref 150–400)
RBC: 4.48 MIL/uL (ref 4.22–5.81)
RDW: 14.1 % (ref 11.5–15.5)
WBC: 9.4 10*3/uL (ref 4.0–10.5)

## 2014-04-27 LAB — BASIC METABOLIC PANEL
ANION GAP: 12 (ref 5–15)
BUN: 32 mg/dL — ABNORMAL HIGH (ref 6–23)
CO2: 20 meq/L (ref 19–32)
Calcium: 8.9 mg/dL (ref 8.4–10.5)
Chloride: 105 mEq/L (ref 96–112)
Creatinine, Ser: 0.77 mg/dL (ref 0.50–1.35)
GLUCOSE: 152 mg/dL — AB (ref 70–99)
Potassium: 3.9 mEq/L (ref 3.7–5.3)
Sodium: 137 mEq/L (ref 137–147)

## 2014-04-27 LAB — GLUCOSE, CAPILLARY
GLUCOSE-CAPILLARY: 132 mg/dL — AB (ref 70–99)
GLUCOSE-CAPILLARY: 120 mg/dL — AB (ref 70–99)
GLUCOSE-CAPILLARY: 127 mg/dL — AB (ref 70–99)
Glucose-Capillary: 124 mg/dL — ABNORMAL HIGH (ref 70–99)
Glucose-Capillary: 115 mg/dL — ABNORMAL HIGH (ref 70–99)

## 2014-04-27 LAB — MAGNESIUM: MAGNESIUM: 1.9 mg/dL (ref 1.5–2.5)

## 2014-04-27 LAB — TROPONIN I

## 2014-04-27 LAB — PROTIME-INR
INR: 1.48 (ref 0.00–1.49)
Prothrombin Time: 17.9 seconds — ABNORMAL HIGH (ref 11.6–15.2)

## 2014-04-27 LAB — APTT: APTT: 31 s (ref 24–37)

## 2014-04-27 LAB — PHOSPHORUS: PHOSPHORUS: 3.1 mg/dL (ref 2.3–4.6)

## 2014-04-27 MED ORDER — DEXTROSE-NACL 5-0.45 % IV SOLN
INTRAVENOUS | Status: DC
  Filled 2014-04-27 (×4): qty 1000

## 2014-04-27 MED ORDER — FAT EMULSION 20 % IV EMUL
250.0000 mL | INTRAVENOUS | Status: AC
  Administered 2014-04-27: 250 mL via INTRAVENOUS
  Filled 2014-04-27: qty 250

## 2014-04-27 MED ORDER — MIDAZOLAM HCL 2 MG/2ML IJ SOLN
INTRAMUSCULAR | Status: AC
  Filled 2014-04-27: qty 4

## 2014-04-27 MED ORDER — LIDOCAINE HCL 1 % IJ SOLN
INTRAMUSCULAR | Status: AC
  Filled 2014-04-27: qty 20

## 2014-04-27 MED ORDER — SODIUM CHLORIDE 0.9 % IJ SOLN
10.0000 mL | INTRAMUSCULAR | Status: DC | PRN
  Administered 2014-04-27 – 2014-05-01 (×4): 10 mL
  Administered 2014-05-01: 20 mL
  Administered 2014-05-03 – 2014-05-04 (×3): 10 mL

## 2014-04-27 MED ORDER — DILTIAZEM HCL 100 MG IV SOLR
5.0000 mg/h | INTRAVENOUS | Status: DC
  Administered 2014-04-27 – 2014-05-02 (×5): 5 mg/h via INTRAVENOUS
  Filled 2014-04-27 (×4): qty 100

## 2014-04-27 MED ORDER — MIDAZOLAM HCL 2 MG/2ML IJ SOLN
INTRAMUSCULAR | Status: AC | PRN
  Administered 2014-04-27: 0.5 mg via INTRAVENOUS

## 2014-04-27 MED ORDER — FENTANYL CITRATE 0.05 MG/ML IJ SOLN
INTRAMUSCULAR | Status: AC
  Filled 2014-04-27: qty 4

## 2014-04-27 MED ORDER — LIDOCAINE HCL (PF) 1 % IJ SOLN
INTRAMUSCULAR | Status: AC
  Filled 2014-04-27: qty 30

## 2014-04-27 MED ORDER — FENTANYL CITRATE 0.05 MG/ML IJ SOLN
INTRAMUSCULAR | Status: AC | PRN
  Administered 2014-04-27: 25 ug via INTRAVENOUS

## 2014-04-27 MED ORDER — TRACE MINERALS CR-CU-F-FE-I-MN-MO-SE-ZN IV SOLN
INTRAVENOUS | Status: AC
  Administered 2014-04-27: 20:00:00 via INTRAVENOUS
  Filled 2014-04-27: qty 1000

## 2014-04-27 NOTE — Progress Notes (Signed)
Percutaneous drain today Localized peritonitis - may need exploratory laparotomy if drain does not resolve symptoms. Continue abx  Continue NG tube  Imogene Burn. Georgette Dover, MD, [facility] Surgery  General/ Trauma Surgery  04/27/2014 11:33 AM

## 2014-04-27 NOTE — Progress Notes (Signed)
Pt transferred from 6N. Pt afib on monitor 110-120. Other VSS. Cardizem gtt initiated at 5mg  with 2 RNs verifying. Pt and family oriented to unit and call bell. RLQ drain emptied and NGT connected to LIS per order. Will continue to monitor.

## 2014-04-27 NOTE — Progress Notes (Signed)
Patient ID: Perry [name], male   DOB: [birth year], 68 y.o.   MRN: [id number] Discussed findings and plan of care with patient and family

## 2014-04-27 NOTE — Progress Notes (Signed)
Patient ID: Perry [name], male   DOB: 12-31-1945, 68 y.o.   MRN: [id number] Was called by Dr. Laurence Ferrari with IR after patient's procedure was complete.  He got 175cc of think brown foul smelling drainage.  The patient on note also went into new onset A fib during the procedure.  Upon arrival back on the floor the patient had no complaints of CP or SOB.  The patient was placed on telemetry and a STAT EKG was obtained.  This revealed a fib with RVR.  A PCXR was also obtained which showed no active cardiopulmonary issues.  Troponins have also been ordered just as a rule out.  My suspicion for an MI or other complication is low.  I have call Dr. Wynonia Lawman, who is the patient's primary cardiologist.  He will come see the patient.  Given the patient is in RVR, I told the patient and his family to not be surprised if he needs to move to a SDU for some type of possible drip, such as cardizem.  They are all understanding and very appreciative of his care.  PE: Heart: irregularly irregular Lungs: CTAB Abd: new JP drain with thin, enteric appearing contents present  Plan: 1. Await cards evaluation and further assistance with a fib

## 2014-04-27 NOTE — Consult Note (Signed)
Chief Complaint: Chief Complaint  Patient presents with  . Abdominal Pain  new fluid collection RLQ post perforated appendix/ lap appendectomy 9/15  Referring Physician(s): Dr Donne Hazel  History of Present Illness: Perry [name] is a 68 y.o. male  Perforated appendix Lap appy 04/21/14 Continued abd pain CT 9/20 reveals RLQ collection Request made from [facility] for IR consult for drain placement Dr Laurence Ferrari has seen and reviewed imaging I have seen and examined pt Now scheduled for RLQ drain placement  Past Medical History  Diagnosis Date  . CAD (coronary artery disease)   . 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 Hernia, inguinal     right  . Hyperglycemia   . Hearing loss     mild  . Diabetes mellitus   . Hypertension   . Myocardial infarction     Past Surgical History  Procedure Laterality Date  . Back surgery  2001    lumbar  . Coronary artery bypass graft  2003    2V  . Coronary angioplasty      multiple  . Spine surgery    . Appendectomy  04/21/2014  . Laparoscopic appendectomy N/A 04/21/2014    Procedure: APPENDECTOMY LAPAROSCOPIC;  Surgeon: Rolm Bookbinder, MD;  Location: [facility] [place];  Service: General;  Laterality: N/A;    Allergies: Xanax  Medications: Prior to Admission medications   Medication Sig Start Date End Date Taking? Authorizing Provider  aspirin 81 MG chewable tablet Chew 81 mg by mouth daily.   Yes Historical Provider, MD  bismuth subsalicylate (PEPTO BISMOL) 262 MG chewable tablet Chew 524 mg by mouth as needed for indigestion.   Yes Historical Provider, MD  fenofibrate (TRICOR) 145 MG tablet Take 145 mg by mouth daily.     Yes Historical Provider, MD  fish oil-omega-3 fatty acids 1000 MG capsule Take 2 g by mouth daily.     Yes Historical Provider, MD  lisinopril (PRINIVIL,ZESTRIL) 40 MG tablet Take 40 mg by mouth daily.     Yes Historical Provider, MD  metoprolol (TOPROL-XL) 50 MG 24 hr tablet Take 25 mg by mouth  daily.    Yes Historical Provider, MD  rosuvastatin (CRESTOR) 5 MG tablet Take 5 mg by mouth daily.     Yes Historical Provider, MD    Family History  Problem Relation Age of Onset  . Heart disease Father   . Heart disease Brother   . Cancer Brother     History   Social History  . Marital Status: Married    Spouse Name: Perry [name]    Number of Children: 2  . Years of Education: Masters   Social History Main Topics  . Smoking status: Former Research scientist (life sciences)  . Smokeless tobacco: Former Systems developer  . Alcohol Use: Yes     Comment: 2 drinks  . Drug Use: No  . Sexual Activity: None   Other Topics Concern  . None   Social History Narrative   Patient is married Perry [name]) and lives at home with his wife.   Patient has two adult children.   Patient is working full-time.   Patient has a Master's degree.   Patient drinks one cup of coffee every morning.   Patient is right-handed.     Review of Systems: A 12 point ROS discussed and pertinent positives are indicated in the HPI above.  All other systems are negative.  Review of Systems  Constitutional: Positive for fever, activity change, appetite change and fatigue.  Respiratory: Negative for choking, chest tightness and shortness of breath.   Cardiovascular: Negative for chest pain.  Gastrointestinal: Positive for nausea, abdominal pain and abdominal distention.  Musculoskeletal: Positive for back pain.  Psychiatric/Behavioral: Negative for behavioral problems and confusion.    Vital Signs: BP 123/81  Pulse 95  Temp(Src) 98.2 F (36.8 C) (Oral)  Resp 19  Ht 6\' 1"  (1.854 m)  Wt 110.043 kg (242 lb 9.6 oz)  BMI 32.01 kg/m2  SpO2 95%  Physical Exam  Constitutional: He is oriented to person, place, and time. He appears well-nourished.  Cardiovascular: Normal rate and regular rhythm.   Pulmonary/Chest: Effort normal and breath sounds normal. No respiratory distress.  Abdominal: Soft. Bowel sounds are normal. He exhibits distension. There is  tenderness.  Musculoskeletal: Normal range of motion.  Neurological: He is alert and oriented to person, place, and time.  Skin: Skin is warm and dry.  Psychiatric: He has a normal mood and affect. His behavior is normal. Judgment and thought content normal.    Imaging: Dg Abd 1 View  04/24/2014   CLINICAL DATA:  Ileus.  EXAM: ABDOMEN - 1 VIEW  COMPARISON:  April 23, 2014.  FINDINGS: Residual contrast seen in right colon on prior exam has now progressed to the more distal colon and rectum. There remains dilated small bowel loops which are unchanged compared to prior exam consistent with ileus or possibly partial distal small bowel obstruction. Nasogastric tube tip is seen in expected position of proximal stomach.  IMPRESSION: Stable dilated small bowel loops are noted concerning for ileus or possibly partial distal small bowel obstruction. Continued radiographic follow-up is recommended.   Electronically Signed   By: Sabino Dick M.D.   On: 04/24/2014 08:15   Ct Abdomen Pelvis W Contrast  04/26/2014   CLINICAL DATA:  Postop appendectomy. Increasing right lower quadrant pain. Ileus.  EXAM: CT ABDOMEN AND PELVIS WITH CONTRAST  TECHNIQUE: Multidetector CT imaging of the abdomen and pelvis was performed using the standard protocol following bolus administration of intravenous contrast.  CONTRAST:  183mL OMNIPAQUE IOHEXOL 300 MG/ML  SOLN  COMPARISON:  Abdominal radiographs, 04/24/2014. Abdominal pelvic CT, 04/21/2014  FINDINGS: There is a collection in the right lower quadrant measuring 9.7 cm x 5.8 cm by 7.5 cm. This collection contains some oral contrast. There is nondependent air within this collection. A loop of ileum lies contiguous with the medial margin of this collection. There is also tubular structure extending from the cecal tip that lies along the inferomedial margin of this collection. This appears to be residual appendix. There is in vascular clips along the distal margin of this. It  measures approximately 6.4 cm in length.  Inflammatory changes are noted throughout the right abdomen extending from the mid abdomen just below the inferior liver edge to the right pelvis. There is thickening of the lateral Conal and anterior pararenal fascia. A small amount of free fluid collects within the pelvis and within the use of the small bowel mesentery.  There is mild dilation of loops of proximal small bowel consistent with a mild adynamic ileus. The colon is mostly decompressed. There is no evidence of obstruction.  No free air.  Minimal right effusion. Lung base atelectasis, most evident in the right lower lobe.  Fatty infiltration of the liver. Spleen is unremarkable. There is material increased attenuation in the gallbladder which is likely some contrast excreted in the bile. Gallbladder otherwise unremarkable. No bile duct dilation. Normal pancreas. No adrenal masses.  Stable low-density  lesions in the right kidney consistent with cysts. Tiny low-density lesion in the lower pole left kidney is also stable in likely a cyst. No hydronephrosis. Normal ureters. Bladder is unremarkable. Prostate gland is enlarged.  No adenopathy.  IMPRESSION: 1. Right lower quadrant collection measuring 9.7 cm x 5.8 cm x 7.5 cm. This collection contains oral contrast and nondependent air. It may communicate with a directly adjacent loop of ileum or the inferior margin of the right colon, proximal to the ileocolic junction. There are surrounding inflammatory changes. 2. A residual appendix is evident with a clip along its inferior margin. This measures 8 mm in diameter and approximately 6.4 cm in length. 3. Small amount of ascites. 4. Mild small bowel adynamic ileus. Colon mostly decompressed. No obstruction. 5. Minimal right effusion and lung base atelectasis, most notable on the right.   Electronically Signed   By: Lajean Manes M.D.   On: 04/26/2014 18:27   Ct Abdomen Pelvis W Contrast  04/21/2014   CLINICAL DATA:   RIGHT lower quadrant pain to palpation; assess for appendicitis.  EXAM: CT ABDOMEN AND PELVIS WITH CONTRAST  TECHNIQUE: Multidetector CT imaging of the abdomen and pelvis was performed using the standard protocol following bolus administration of intravenous contrast.  CONTRAST:  155mL OMNIPAQUE IOHEXOL 300 MG/ML  SOLN  COMPARISON:  Pelvic radiograph January 22, 2014  FINDINGS: LUNG BASES: Included view of the lung bases are clear. The heart is mildly enlarged. Partially imaged coronary artery calcifications. Included pericardium is unremarkable.  SOLID ORGANS: The liver is diffusely hypodense, consistent with steatosis, prominent LEFT lobe of the liver. Spleen, gallbladder and adrenal glands are unremarkable. Mildly atrophic pancreas.  GASTROINTESTINAL TRACT: Small hiatal hernia. Contracted stomach results and thickened appearance of the walls. The small and large bowel are normal in course and caliber without inflammatory changes. The appendix is a large, 12 mm with punctate central appendicolith, surrounding periappendiceal inflammation.  KIDNEYS/ URINARY TRACT: Kidneys are orthotopic, demonstrating symmetric enhancement. No nephrolithiasis, hydronephrosis or solid renal masses. 2.3 cm exophytic RIGHT upper pole nonenhancing cyst. Too small to characterize RIGHT upper pole hypodensity. The unopacified ureters are normal in course and caliber. Delayed imaging through the kidneys demonstrates symmetric prompt contrast excretion within the proximal urinary collecting system. Urinary bladder is partially distended and unremarkable.  PERITONEUM/RETROPERITONEUM: No intraperitoneal free fluid nor free air. Aortoiliac vessels are normal in caliber, tortuous in course with mild calcific atherosclerosis No lymphadenopathy by CT size criteria. Prostate is enlarged, 5.2 x 5.7 cm.  SOFT TISSUE/OSSEOUS STRUCTURES: Nonsuspicious. Moderate to large bilateral fat containing inguinal hernias. Moderate lumbar spondylosis, resulting in  moderate to severe L3-4, L4-5 neural foraminal narrowing.  IMPRESSION: Acute uncomplicated appendicitis.  Hepatic steatosis and borderline hepatomegaly.  Acute findings discussed with and reconfirmed by Dr.DAVID YAO on9/15/2015at1:11 am.   Electronically Signed   By: Elon Alas   On: 04/21/2014 01:12   Dg Abd Portable 1v  04/23/2014   CLINICAL DATA:  Abdominal distention.  EXAM: PORTABLE ABDOMEN - 1 VIEW  COMPARISON:  CT scan of April 21, 2014.  FINDINGS: Residual contrast is noted within the right colon. Mildly dilated small bowel loops are noted concerning for ileus or possibly distal small bowel obstruction. Mild degenerative changes are noted in the lumbar spine.  IMPRESSION: Mildly dilated small bowel loops are noted concerning for ileus or possibly distal small bowel obstruction. Followup radiographs are recommended.   Electronically Signed   By: Sabino Dick M.D.   On: 04/23/2014 08:03  Labs: Lab Results  Component Value Date   WBC 9.2 04/26/2014   HCT 38.3* 04/26/2014   MCV 93.6 04/26/2014   PLT 206 04/26/2014   NA 148* 04/26/2014   K 4.4 04/26/2014   CL 114* 04/26/2014   CO2 22 04/26/2014   GLUCOSE 120* 04/26/2014   BUN 37* 04/26/2014   CREATININE 0.96 04/26/2014   CALCIUM 9.2 04/26/2014   PROT 6.3 04/23/2014   ALBUMIN 2.8* 04/23/2014   AST 18 04/23/2014   ALT 16 04/23/2014   ALKPHOS 42 04/23/2014   BILITOT 0.7 04/23/2014   GFRNONAA 83* 04/26/2014   GFRAA >90 04/26/2014    Assessment and Plan:  Perforated appendix 9/15 Lap appy 9/15 Continued abd pain CT shows new RLQ collection Now scheduled for RLQ abscess drain placement  Thank you for this interesting consult.  I greatly enjoyed meeting SEMAJ COBURN and look forward to participating in their care.   Lavonia Drafts PAC-IR   I spent a total of 30 minutes face to face in clinical consultation, greater than 50% of which was counseling/coordinating care for RLQ abscess drain placement

## 2014-04-27 NOTE — Progress Notes (Addendum)
**Note [name]-Identified via Obfuscation** Patient ID: [name], male   DOB: Apr 03, 1946, 68 y.o.   MRN: [id number] 6 Days Post-Op  Subjective: Pt continues to have RLQ abdominal pain.  CT reveals abscess with presumed leak given contrast present in abscess cavity with retained 6cm of appendix along the cecum.  Objective: Vital signs in last 24 hours: Temp:  [98.2 F (36.8 C)-98.5 F (36.9 C)] 98.2 F (36.8 C) (09/21 0550) Pulse Rate:  [82-96] 95 (09/21 0550) Resp:  [19-20] 19 (09/21 0550) BP: (123-144)/(78-95) 123/81 mmHg (09/21 0550) SpO2:  [95 %-99 %] 95 % (09/21 0550) Last BM Date: 04/25/14  Intake/Output from previous day: 09/20 0701 - 09/21 0700 In: 4169.6 [I.V.:3909.6; NG/GT:260] Out: 1250 [Urine:600; Emesis/NG output:650] Intake/Output this shift:    PE: Abd: still distended, very tender in RLQ, hypoactive BS, NGT with bilious output, incisions c/d/i with surrounding ecchymosis at his umbilicus and LLQ incision. Lungs: CTAB  Lab Results:   Recent Labs  04/25/14 0049 04/26/14 1200  WBC 6.0 9.2  HGB 12.9* 12.8*  HCT 37.4* 38.3*  PLT 181 206   BMET  Recent Labs  04/26/14 0550 04/27/14 0910  NA 148* 137  K 4.4 3.9  CL 114* 105  CO2 22 20  GLUCOSE 120* 152*  BUN 37* 32*  CREATININE 0.96 0.77  CALCIUM 9.2 8.9   PT/INR  Recent Labs  04/27/14 0915  LABPROT 17.9*  INR 1.48   CMP     Component Value Date/Time   NA 137 04/27/2014 0910   K 3.9 04/27/2014 0910   CL 105 04/27/2014 0910   CO2 20 04/27/2014 0910   GLUCOSE 152* 04/27/2014 0910   BUN 32* 04/27/2014 0910   CREATININE 0.77 04/27/2014 0910   CALCIUM 8.9 04/27/2014 0910   PROT 6.3 04/23/2014 0410   ALBUMIN 2.8* 04/23/2014 0410   AST 18 04/23/2014 0410   ALT 16 04/23/2014 0410   ALKPHOS 42 04/23/2014 0410   BILITOT 0.7 04/23/2014 0410   GFRNONAA >90 04/27/2014 0910   GFRAA >90 04/27/2014 0910   Lipase     Component Value Date/Time   LIPASE 22 04/20/2014 2032       Studies/Results: Ct Abdomen Pelvis W Contrast  04/26/2014    CLINICAL DATA:  Postop appendectomy. Increasing right lower quadrant pain. Ileus.  EXAM: CT ABDOMEN AND PELVIS WITH CONTRAST  TECHNIQUE: Multidetector CT imaging of the abdomen and pelvis was performed using the standard protocol following bolus administration of intravenous contrast.  CONTRAST:  173mL OMNIPAQUE IOHEXOL 300 MG/ML  SOLN  COMPARISON:  Abdominal radiographs, 04/24/2014. Abdominal pelvic CT, 04/21/2014  FINDINGS: There is a collection in the right lower quadrant measuring 9.7 cm x 5.8 cm by 7.5 cm. This collection contains some oral contrast. There is nondependent air within this collection. A loop of ileum lies contiguous with the medial margin of this collection. There is also tubular structure extending from the cecal tip that lies along the inferomedial margin of this collection. This appears to be residual appendix. There is in vascular clips along the distal margin of this. It measures approximately 6.4 cm in length.  Inflammatory changes are noted throughout the right abdomen extending from the mid abdomen just below the inferior liver edge to the right pelvis. There is thickening of the lateral Conal and anterior pararenal fascia. A small amount of free fluid collects within the pelvis and within the use of the small bowel mesentery.  There is mild dilation of loops of proximal small bowel consistent with a mild  adynamic ileus. The colon is mostly decompressed. There is no evidence of obstruction.  No free air.  Minimal right effusion. Lung base atelectasis, most evident in the right lower lobe.  Fatty infiltration of the liver. Spleen is unremarkable. There is material increased attenuation in the gallbladder which is likely some contrast excreted in the bile. Gallbladder otherwise unremarkable. No bile duct dilation. Normal pancreas. No adrenal masses.  Stable low-density lesions in the right kidney consistent with cysts. Tiny low-density lesion in the lower pole left kidney is also stable in  likely a cyst. No hydronephrosis. Normal ureters. Bladder is unremarkable. Prostate gland is enlarged.  No adenopathy.  IMPRESSION: 1. Right lower quadrant collection measuring 9.7 cm x 5.8 cm x 7.5 cm. This collection contains oral contrast and nondependent air. It may communicate with a directly adjacent loop of ileum or the inferior margin of the right colon, proximal to the ileocolic junction. There are surrounding inflammatory changes. 2. A residual appendix is evident with a clip along its inferior margin. This measures 8 mm in diameter and approximately 6.4 cm in length. 3. Small amount of ascites. 4. Mild small bowel adynamic ileus. Colon mostly decompressed. No obstruction. 5. Minimal right effusion and lung base atelectasis, most notable on the right.   Electronically Signed   By: Lajean Manes M.D.   On: 04/26/2014 18:27    Anti-infectives: Anti-infectives   Start     Dose/Rate Route Frequency Ordered Stop   04/26/14 2200  ertapenem (INVANZ) 1 g in sodium chloride 0.9 % 50 mL IVPB     1 g 100 mL/hr over 30 Minutes Intravenous Every 24 hours 04/26/14 2135     04/22/14 0300  cefTRIAXone (ROCEPHIN) 1 g in dextrose 5 % 50 mL IVPB  Status:  Discontinued     1 g 100 mL/hr over 30 Minutes Intravenous Every 24 hours 04/21/14 0514 04/26/14 2135   04/21/14 1200  metroNIDAZOLE (FLAGYL) IVPB 500 mg     500 mg 100 mL/hr over 60 Minutes Intravenous Every 8 hours 04/21/14 0514     04/21/14 0130  cefTRIAXone (ROCEPHIN) 1 g in dextrose 5 % 50 mL IVPB     1 g 100 mL/hr over 30 Minutes Intravenous  Once 04/21/14 0120 04/21/14 0347   04/21/14 0130  metroNIDAZOLE (FLAGYL) IVPB 500 mg     500 mg 100 mL/hr over 60 Minutes Intravenous  Once 04/21/14 0120 04/21/14 0509       Assessment/Plan  1. POD 7, s/p lap appendectomy for perforated appendicitis 2. RLQ abscess with presumed leak  3. Retained appendix 4. Prolonged ileus, will start TNA 5. HTN 6. ADDEDUM: New onset A fib (see addendum note for  details)   Plan: 1. IR has been consulted for perc drain of RLQ abscess.  Given concern for leak, the patient will remain NPO with NGT as well.  We will start him on TNA for prolonged ileus, leak, and PCM. 2. Cont abx therapy, Invanz D2  (was on Cefipime and Flagyl for prior 5 days) 3. I have had a long discussion with the patient and his wife explaining his current situation and our plans from here.  He and his wife are very understanding and are agreeable with the current plan. 4. Follow labs 5. Mobilize and pulm toilet  LOS: 7 days    [place],Braiden Presutti E 04/27/2014, 10:49 AM Pager: [phone]

## 2014-04-27 NOTE — Procedures (Signed)
Interventional Radiology Procedure Note  Procedure: Placement of 80F drain into RLQ fluid collection.  Aspiration of 175 mL thin, brown fouldsmelling fluid.  Sample sent for cx. Complications: none Recommendations: - Drain to JP - Cx are pending  Signed,  Criselda Peaches, MD

## 2014-04-27 NOTE — Sedation Documentation (Signed)
Dr Laurence Ferrari informed of pt's afib, appears to be new this admission.  Asymptomatic, vss pt unaware of history.

## 2014-04-27 NOTE — Consult Note (Signed)
Cardiology Consult Note  Admit date: 04/20/2014 Name: Perry [name] 68 y.o.  male DOB:  1946/01/03 MRN:  [id number]  Today's date:  04/27/2014  Referring Physician:    [facility]  Primary Physician:    Dr. Ivar Bury  Reason for Consultation:    Atrial fibrillation  IMPRESSIONS: 1. Atrial fibrillation uncertain of the duration or date of onset but it appears to have been clinically since at least 04/22/14 since he was in sinus rhythm according to the operative anesthesia record.  2. Coronary artery disease with previous inferior infarction and bypass grafting clinically stable 3. Hyperlipidemia 4. Obesity 5. Abcess currently drained following recent appendectomy  RECOMMENDATION: He still has some increased rate and is currently n.p.o. Since we do not really know the date of onset of atrial fibrillation it may have been greater than 24 hours. He is not currently a good candidate for anticoagulation so I would use rate control  with intravenous diltiazem until the course of the abscess and surgical courses determined.  May need to screen for pulmonary emboli or DVT also.  HISTORY: This 68 year-old male has a history of coronary artery disease with previous inferior infarction. He has had previous bypass grafting on an emergency basis with a mammary bypass to the LAD and a vein bypass to the posterior descending artery in 2004. This was done emergently in the setting of an inferior infarction that had a couple of episodes of ventricular fibrillation. He had done well since then and recently had a myocardial perfusion scan that was unremarkable. He had recently noted some dyspnea was in the process of being evaluated with a sleep study.  He has no prior history of atrial fibrillation. He was admitted to the hospital with appendicitis and had surgery last Tuesday on the 15th. The anesthesia records record that he was in sinus rhythm. He was started on intravenous beta blockers  but evidently not until the 18th the best I can tell. He was not on telemetry postoperatively and the notes in the nursing record heart rates above 100 on several occasions over the weekend. He developed severe pain in his right lower quadrant following surgery and was found to have an abscess this was drained percutaneously today. On arrival to radiology he was found to be in atrial fibrillation. The patient currently feels much better and is in less pain is not currently having shortness of breath. He noted some mild swelling of his lower extremities and felt some pain in his groin earlier today. He has had no angina.   Past Medical History  Diagnosis Date  . CAD (coronary artery disease)   . 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 Hernia, inguinal     right  . Hyperglycemia   . Hearing loss     mild  . Diabetes mellitus   . Hypertension   . Myocardial infarction       Past Surgical History  Procedure Laterality Date  . Back surgery  2001    lumbar  . Coronary artery bypass graft  2003    2V  . Coronary angioplasty      multiple  . Spine surgery    . Appendectomy  04/21/2014  . Laparoscopic appendectomy N/A 04/21/2014    Procedure: APPENDECTOMY LAPAROSCOPIC;  Surgeon: Rolm Bookbinder, MD;  Location: [facility] OR;  Service: General;  Laterality: N/A;     Allergies:  is allergic to xanax.   Medications: Prior to Admission medications  Medication Sig Start Date End Date Taking? Authorizing Provider  aspirin 81 MG chewable tablet Chew 81 mg by mouth daily.   Yes Historical Provider, MD  bismuth subsalicylate (PEPTO BISMOL) 262 MG chewable tablet Chew 524 mg by mouth as needed for indigestion.   Yes Historical Provider, MD  fenofibrate (TRICOR) 145 MG tablet Take 145 mg by mouth daily.     Yes Historical Provider, MD  fish oil-omega-3 fatty acids 1000 MG capsule Take 2 g by mouth daily.     Yes Historical Provider, MD  lisinopril (PRINIVIL,ZESTRIL) 40 MG tablet  Take 40 mg by mouth daily.     Yes Historical Provider, MD  metoprolol (TOPROL-XL) 50 MG 24 hr tablet Take 25 mg by mouth daily.    Yes Historical Provider, MD  rosuvastatin (CRESTOR) 5 MG tablet Take 5 mg by mouth daily.     Yes Historical Provider, MD   Family History: Family Status  Relation Status Death Age  . Mother Alive   . Father Deceased   . Sister Alive   . Brother Alive   . Brother Alive    Social History:   reports that he has quit smoking. He has quit using smokeless tobacco. He reports that he drinks alcohol. He reports that he does not use illicit drugs.   History   Social History Narrative   Patient is married [name]) and lives at home with his wife.   Patient has two adult children.   Patient is working full-time.   Patient has a Master's degree.   Patient drinks one cup of coffee every morning.   Patient is right-handed.    Review of Systems: Otherwise unremarkable except as noted above  Physical Exam: BP 136/87  Pulse 107  Temp(Src) 98.2 F (36.8 C) (Oral)  Resp 16  Ht 6\' 1"  (1.854 m)  Wt 110.043 kg (242 lb 9.6 oz)  BMI 32.01 kg/m2  SpO2 97%  General appearance: Pleasant white male in no acute distress currently with an NG tube in place Head: Normocephalic, without obvious abnormality, atraumatic Neck: no adenopathy, no carotid bruit, no JVD and supple, symmetrical, trachea midline Lungs: clear to auscultation bilaterally Heart: Irregular rhythm, normal S1-S2, no S3 Abdomen: Deferred due to recent surgery and drained Rectal: deferred Extremities: 1+ edema, no tenderness Pulses: 2+ and symmetric Skin: Skin color, texture, turgor normal. No rashes or lesions Neurologic: Grossly normal  Labs: CBC  Recent Labs  04/26/14 1200 04/27/14 0910  WBC 9.2 9.4  RBC 4.09* 4.48  HGB 12.8* 14.2  HCT 38.3* 41.8  PLT 206 202  MCV 93.6 93.3  MCH 31.3 31.7  MCHC 33.4 34.0  RDW 13.7 14.1  LYMPHSABS 0.6*  --   MONOABS 0.6  --   EOSABS 0.1  --    BASOSABS 0.0  --    CMP   Recent Labs  04/27/14 0910  NA 137  K 3.9  CL 105  CO2 20  GLUCOSE 152*  BUN 32*  CREATININE 0.77  CALCIUM 8.9  GFRNONAA >90  GFRAA >90   BNP (last 3 results) No results found for this basename: PROBNP,  in the last 8760 hours Cardiac Panel (last 3 results)  Recent Labs  04/27/14 1429  TROPONINI <0.30     Radiology: Clear lungs, PICC line at the cavoatrial junction  EKG: Atrial fibrillation with somewhat rapid response, previous inferior infarction  Signed:  W. Doristine Church MD [facility]   Cardiology Consultant  04/27/2014, 6:22 PM

## 2014-04-27 NOTE — Progress Notes (Addendum)
PARENTERAL NUTRITION CONSULT NOTE - INITIAL  Pharmacy Consult:  TPN Indication:  Prolonged ileus  Allergies  Allergen Reactions  . Xanax [Alprazolam] Shortness Of Breath    Patient Measurements: Height: 6\' 1"  (185.4 cm) Weight: 242 lb 9.6 oz (110.043 kg) IBW/kg (Calculated) : 79.9 AdjBW = 89 kg  Vital Signs: Temp: 98.2 F (36.8 C) (09/21 0550) BP: 131/94 mmHg (09/21 1216) Pulse Rate: 103 (09/21 1216) Intake/Output from previous day: 09/20 0701 - 09/21 0700 In: 4169.6 [I.V.:3909.6; NG/GT:260] Out: 1250 [Urine:600; Emesis/NG output:650]  Labs:  Recent Labs  04/25/14 0049 04/26/14 1200 04/27/14 0910 04/27/14 0915  WBC 6.0 9.2 9.4  --   HGB 12.9* 12.8* 14.2  --   HCT 37.4* 38.3* 41.8  --   PLT 181 206 202  --   APTT  --   --   --  31  INR  --   --   --  1.48     Recent Labs  04/25/14 0049 04/26/14 0550 04/27/14 0910  NA 141 148* 137  K 4.0 4.4 3.9  CL 109 114* 105  CO2 18* 22 20  GLUCOSE 122* 120* 152*  BUN 28* 37* 32*  CREATININE 0.85 0.96 0.77  CALCIUM 9.1 9.2 8.9  MG  --   --  1.9  PHOS  --   --  3.1   Estimated Creatinine Clearance: 114.9 ml/min (by C-G formula based on Cr of 0.77).    Recent Labs  04/26/14 2358 04/27/14 0402 04/27/14 0745  GLUCAP 126* 124* 132*    Medical History: Past Medical History  Diagnosis Date  . CAD (coronary artery disease)   . 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 Hernia, inguinal     right  . Hyperglycemia   . Hearing loss     mild  . Diabetes mellitus   . Hypertension   . Myocardial infarction       Insulin Requirements in the past 24 hours:  3 units moderate SSI  Assessment: Perry [name] admitted 04/21/14 with abdominal pain.  Found to have ruptured appendix and taken to the OR on the same day for appendectomy.  NG tube was placed on 04/23/14 due to abdominal distention.  Abdominal films on 04/24/14 showed ileus.  He also developed RLQ abscess that will require the placement of a  drain per IR.  Pharmacy consulted to initiate TPN for nutritional support.  GI: ileus s/p appendectomy Endo: DM with A1c 6.9% - CBGs well controlled Lytes: all WNL Renal: SCr 0.77 - D51/2NS at 125 ml/hr Pulm: RA >> 2L  Cards: CAD / HTN - VSS, in Afib - on IV Lopressor Hepatobil: LFTs WNL, albumin WNL on admit and now low at 2.8 ID: Abx #[id number] - Invanz D#2 for localized peritonitis, afebrile, WBC WNL Best Practices: Lovenox TPN Access: PICC to be placed 04/27/14 TPN day#: 0 (9/21 >> )  Current Nutrition:  NPO  Nutritional Goals:  2200-2300 kCal, 110-125 grams of protein per day   Plan:  - After PICC placed, then start - Clinimix E 5/15 at 40 ml/hr + IVFE at 10 ml/hr - Decrease IVF to 85 ml/hr once TPN starts - Daily multivitamin and trace elements - Continue moderate SSI Q4H - Standard TPN labs    Milayah Krell D. Mina Marble, PharmD, BCPS Pager:  [phone] 04/27/2014, 12:31 PM

## 2014-04-27 NOTE — Progress Notes (Signed)
Peripherally Inserted Central Catheter/Midline Placement  The IV Nurse has discussed with the patient and/or persons authorized to consent for the patient, the purpose of this procedure and the potential benefits and risks involved with this procedure.  The benefits include less needle sticks, lab draws from the catheter and patient may be discharged home with the catheter.  Risks include, but not limited to, infection, bleeding, blood clot (thrombus formation), and puncture of an artery; nerve damage and irregular heat beat.  Alternatives to this procedure were also discussed.  PICC/Midline Placement Documentation        Perry [name] 04/27/2014, 5:09 PM

## 2014-04-27 NOTE — Sedation Documentation (Signed)
O2 d/c'd 

## 2014-04-27 NOTE — Progress Notes (Signed)
Imogene Burn. Georgette Dover, MD, [facility]  General/ Trauma Surgery  04/27/2014 3:24 PM

## 2014-04-28 ENCOUNTER — Encounter (HOSPITAL_COMMUNITY): Payer: Self-pay | Admitting: Cardiology

## 2014-04-28 DIAGNOSIS — E785 Hyperlipidemia, unspecified: Secondary | ICD-10-CM | POA: Insufficient documentation

## 2014-04-28 DIAGNOSIS — I4891 Unspecified atrial fibrillation: Secondary | ICD-10-CM | POA: Diagnosis not present

## 2014-04-28 DIAGNOSIS — M7989 Other specified soft tissue disorders: Secondary | ICD-10-CM

## 2014-04-28 LAB — CBC
HEMATOCRIT: 35.5 % — AB (ref 39.0–52.0)
Hemoglobin: 12.1 g/dL — ABNORMAL LOW (ref 13.0–17.0)
MCH: 31.2 pg (ref 26.0–34.0)
MCHC: 34.1 g/dL (ref 30.0–36.0)
MCV: 91.5 fL (ref 78.0–100.0)
Platelets: 232 10*3/uL (ref 150–400)
RBC: 3.88 MIL/uL — ABNORMAL LOW (ref 4.22–5.81)
RDW: 13.7 % (ref 11.5–15.5)
WBC: 7.1 10*3/uL (ref 4.0–10.5)

## 2014-04-28 LAB — COMPREHENSIVE METABOLIC PANEL
ALBUMIN: 2.1 g/dL — AB (ref 3.5–5.2)
ALK PHOS: 42 U/L (ref 39–117)
ALT: 12 U/L (ref 0–53)
AST: 18 U/L (ref 0–37)
Anion gap: 8 (ref 5–15)
BILIRUBIN TOTAL: 0.4 mg/dL (ref 0.3–1.2)
BUN: 26 mg/dL — ABNORMAL HIGH (ref 6–23)
CO2: 25 mEq/L (ref 19–32)
Calcium: 8.5 mg/dL (ref 8.4–10.5)
Chloride: 107 mEq/L (ref 96–112)
Creatinine, Ser: 0.73 mg/dL (ref 0.50–1.35)
GFR calc Af Amer: >90 mL/min (ref >90)
GFR calc non Af Amer: >90 mL/min (ref >90)
Glucose, Bld: 136 mg/dL — ABNORMAL HIGH (ref 70–99)
POTASSIUM: 3.6 meq/L — AB (ref 3.7–5.3)
Sodium: 140 mEq/L (ref 137–147)
TOTAL PROTEIN: 5.2 g/dL — AB (ref 6.0–8.3)

## 2014-04-28 LAB — DIFFERENTIAL
Basophils Absolute: 0 10*3/uL (ref 0.0–0.1)
Basophils Relative: 0 % (ref 0–1)
EOS ABS: 0.2 10*3/uL (ref 0.0–0.7)
Eosinophils Relative: 3 % (ref 0–5)
LYMPHS PCT: 15 % (ref 12–46)
Lymphs Abs: 1.1 10*3/uL (ref 0.7–4.0)
MONOS PCT: 9 % (ref 3–12)
Monocytes Absolute: 0.6 10*3/uL (ref 0.1–1.0)
NEUTROS ABS: 5.2 10*3/uL (ref 1.7–7.7)
Neutrophils Relative %: 73 % (ref 43–77)

## 2014-04-28 LAB — GLUCOSE, CAPILLARY
GLUCOSE-CAPILLARY: 115 mg/dL — AB (ref 70–99)
Glucose-Capillary: 124 mg/dL — ABNORMAL HIGH (ref 70–99)
Glucose-Capillary: 109 mg/dL — ABNORMAL HIGH (ref 70–99)

## 2014-04-28 LAB — MAGNESIUM: Magnesium: 1.9 mg/dL (ref 1.5–2.5)

## 2014-04-28 LAB — PHOSPHORUS: PHOSPHORUS: 2.9 mg/dL (ref 2.3–4.6)

## 2014-04-28 LAB — PREALBUMIN: PREALBUMIN: 8 mg/dL — AB (ref 17.0–34.0)

## 2014-04-28 LAB — TRIGLYCERIDES: Triglycerides: 100 mg/dL (ref <150)

## 2014-04-28 MED ORDER — POTASSIUM CHLORIDE 10 MEQ/100ML IV SOLN
10.0000 meq | INTRAVENOUS | Status: AC
  Administered 2014-04-28 (×5): 10 meq via INTRAVENOUS
  Filled 2014-04-28: qty 100

## 2014-04-28 MED ORDER — FAT EMULSION 20 % IV EMUL
250.0000 mL | INTRAVENOUS | Status: AC
  Administered 2014-04-28: 250 mL via INTRAVENOUS
  Filled 2014-04-28: qty 250

## 2014-04-28 MED ORDER — CLINIMIX E/DEXTROSE (5/15) 5 % IV SOLN
INTRAVENOUS | Status: AC
Start: 2014-04-28 — End: 2014-04-29
  Administered 2014-04-28: 18:00:00 via INTRAVENOUS
  Filled 2014-04-28: qty 2000

## 2014-04-28 NOTE — Progress Notes (Signed)
Subjective:  Feels better today, no c/o SOB or chest pain  Objective:  Vital Signs in the last 24 hours: BP 123/82  Pulse 66  Temp(Src) 97.2 F (36.2 C) (Oral)  Resp 13  Ht 6\' 1"  (1.854 m)  Wt 115.9 kg (255 lb 8.2 oz)  BMI 33.72 kg/m2  SpO2 96%  Physical Exam: Pleasant WM in NAD Lungs:  Clear  Cardiac:  irregular rhythm, normal S1 and S2, no S3 Extremities:  No edema present  Intake/Output from previous day: 09/21 0701 - 09/22 0700 In: 862.4 [P.O.:120; I.V.:54.9; NG/GT:60; IV Piggyback:100; TPN:512.5] Out: 3790 [Urine:1150; Emesis/NG output:150; Drains:285] Weight Filed Weights   04/20/14 2030 04/21/14 0347 04/27/14 1847  Weight: 111.131 kg (245 lb) 110.043 kg (242 lb 9.6 oz) 115.9 kg (255 lb 8.2 oz)    Lab Results: Basic Metabolic Panel:  Recent Labs  04/27/14 0910 04/28/14 0342  NA 137 140  K 3.9 3.6*  CL 105 107  CO2 20 25  GLUCOSE 152* 136*  BUN 32* 26*  CREATININE 0.77 0.73    CBC:  Recent Labs  04/26/14 1200 04/27/14 0910 04/28/14 0342  WBC 9.2 9.4 7.1  NEUTROABS 7.9*  --  5.2  HGB 12.8* 14.2 12.1*  HCT 38.3* 41.8 35.5*  MCV 93.6 93.3 91.5  PLT 206 202 232    BNP    Component Value Date/Time   PROBNP 64.0 03/21/2009 2355    PROTIME: Lab Results  Component Value Date   INR 1.48 04/27/2014    Telemetry: Atrial fibrillation now with controlled response  Assessment/Plan:  1. Atrial fibrillation new onset now rate controlled (since at least 9/15) 2. CAD with prior IMI and CABG emergent - stable 3. Appendiculat abcess being drained  Rec:  Continue rate control.  He looks a lot better.  It will be hard to systemically anticoagulate him until drains are out.  Once all drains out will anticoagulate and consider an early cardioversion.    Kerry Hough  MD [facility] Cardiology  04/28/2014, 8:52 AM

## 2014-04-28 NOTE — Progress Notes (Signed)
INITIAL NUTRITION ASSESSMENT  DOCUMENTATION CODES Per approved criteria  -Obesity Unspecified   INTERVENTION:  TPN dosing per Pharmacy to meet >90% of estimated nutrition needs.  NUTRITION DIAGNOSIS: Inadequate oral intake related to inability to eat with altered GI function as evidenced by NPO status.   Goal: Intake to meet >90% of estimated nutrition needs.  Monitor:  TPN tolerance/adequacy, weight trend, labs, GI function.  Reason for Assessment: New TPN Consult  68 y.o. male  Admitting Dx: Acute perforated appendicitis  ASSESSMENT: Patient admitted on 9/14 with abdominal pain, CT supported diagnosis of acute appendicitis. S/P laparoscopic appendectomy on 9/15. JP drain placed on 9/21 for RLQ abscess.  Diet was advanced to regular on 9/16, downgraded to NPO 9/17 and NG placed to suction due to abdominal distention. Started on TPN 9/21 due to prolonged ileus.  No nutrition problems identified PTA, except obesity. Nutrition focused physical exam completed.  No muscle or subcutaneous fat depletion noticed.  Patient is receiving TPN with Clinimix E 5/15, increasing to 80 ml/hr and lipids @ 10 ml/hr. Provides 2160 ml, 1843 kcal, and 96 grams protein per day. Meets 84% minimum estimated energy needs and 80% minimum estimated protein needs.   Height: Ht Readings from Last 1 Encounters:  04/27/14 6\' 1"  (1.854 m)    Weight: Wt Readings from Last 1 Encounters:  04/27/14 255 lb 8.2 oz (115.9 kg)    Ideal Body Weight: 83.6 kg  % Ideal Body Weight: 139%  Wt Readings from Last 10 Encounters:  04/27/14 255 lb 8.2 oz (115.9 kg)  04/27/14 255 lb 8.2 oz (115.9 kg)  01/28/14 248 lb (112.492 kg)  01/22/14 240 lb (108.863 kg)    Usual Body Weight: 240 lb  % Usual Body Weight: 106%  BMI:  Body mass index is 33.72 kg/(m^2).  Estimated Nutritional Needs: Kcal: 2200-2500 Protein: 120-140 gm Fluid: 2.2-2.5 L  Skin: surgical incision to abdomen  Diet Order:  NPO  EDUCATION NEEDS: -No education needs identified at this time   Intake/Output Summary (Last 24 hours) at 04/28/14 0954 Last data filed at 04/28/14 0900  Gross per 24 hour  Intake 1027.42 ml  Output   1385 ml  Net -357.58 ml    Last BM: 9/19   Labs:   Recent Labs Lab 04/26/14 0550 04/27/14 0910 04/28/14 0342  NA 148* 137 140  K 4.4 3.9 3.6*  CL 114* 105 107  CO2 22 20 25   BUN 37* 32* 26*  CREATININE 0.96 0.77 0.73  CALCIUM 9.2 8.9 8.5  MG  --  1.9 1.9  PHOS  --  3.1 2.9  GLUCOSE 120* 152* 136*    CBG (last 3)   Recent Labs  04/27/14 1951 04/28/14 0003 04/28/14 0325  GLUCAP 115* 115* 124*    Scheduled Meds: . antiseptic oral rinse  7 mL Mouth Rinse q12n4p  . chlorhexidine  15 mL Mouth Rinse BID  . enoxaparin (LOVENOX) injection  40 mg Subcutaneous Q24H  . ertapenem  1 g Intravenous Q24H  . insulin aspart  0-15 Units Subcutaneous 6 times per day  . metoprolol  5 mg Intravenous 4 times per day  . pantoprazole (PROTONIX) IV  40 mg Intravenous QHS  . potassium chloride  10 mEq Intravenous Q1 Hr x 5    Continuous Infusions: . dextrose 5 % and 0.45% NaCl 1,000 mL infusion 80 mL/hr at 04/27/14 1801  . diltiazem (CARDIZEM) infusion 5 mg/hr (04/28/14 0900)  . Marland KitchenTPN (CLINIMIX-E) Adult 40 mL/hr at 04/28/14 0900  And  . fat emulsion 250 mL (04/28/14 0900)  . Marland KitchenTPN (CLINIMIX-E) Adult     And  . fat emulsion      Past Medical History  Diagnosis Date  . CAD (coronary artery disease)   . Hernia, inguinal     right  . Hyperglycemia   . Hearing loss     mild  . Diabetes mellitus   . Hypertension   . Old inferior wall myocardial infarction 2004  . Lumbar disc disease     Past Surgical History  Procedure Laterality Date  . Lumbar laminectomy  2001    lumbar  . Coronary artery bypass graft  2004    LIMA to LAD and SVG to RCA (emergent)  . Coronary angioplasty      multiple  . Laparoscopic appendectomy N/A 04/21/2014    Procedure: APPENDECTOMY  LAPAROSCOPIC;  Surgeon: Rolm Bookbinder, MD;  Location: [facility];  Service: General;  Laterality: N/A;    Molli Barrows, [place], LDN, [place] Pager [phone] After Hours Pager [phone]

## 2014-04-28 NOTE — Progress Notes (Signed)
Remarkably improved from yesterday prior to drain Drain output is now serous,with no sign of enteric contents Intermittent tachycardia - appreciate Cardiology assistance Abd - softer, + BS Continue abx  Imogene Burn. Georgette Dover, MD, [facility] Surgery  General/ Trauma Surgery  04/28/2014 8:36 AM

## 2014-04-28 NOTE — Progress Notes (Signed)
PARENTERAL NUTRITION CONSULT NOTE - FOLLOW UP  Pharmacy Consult:  TPN Indication:  Prolonged ileus  Allergies  Allergen Reactions  . Xanax [Alprazolam] Shortness Of Breath    Patient Measurements: Height: 6\' 1"  (185.4 cm) Weight: 255 lb 8.2 oz (115.9 kg) IBW/kg (Calculated) : 79.9 AdjBW = 89 kg  Vital Signs: Temp: 97.2 F (36.2 C) (09/22 0700) Temp src: Oral (09/22 0700) BP: 123/82 mmHg (09/22 0600) Pulse Rate: 66 (09/22 0600) Intake/Output from previous day: 09/21 0701 - 09/22 0700 In: 862.4 [P.O.:120; I.V.:54.9; NG/GT:60; IV Piggyback:100; TPN:512.5] Out: 4332 [Urine:1150; Emesis/NG output:150; Drains:285]  Labs:  Recent Labs  04/26/14 1200 04/27/14 0910 04/27/14 0915 04/28/14 0342  WBC 9.2 9.4  --  7.1  HGB 12.8* 14.2  --  12.1*  HCT 38.3* 41.8  --  35.5*  PLT 206 202  --  232  APTT  --   --  31  --   INR  --   --  1.48  --      Recent Labs  04/26/14 0550 04/27/14 0910 04/28/14 0342  NA 148* 137 140  K 4.4 3.9 3.6*  CL 114* 105 107  CO2 22 20 25   GLUCOSE 120* 152* 136*  BUN 37* 32* 26*  CREATININE 0.96 0.77 0.73  CALCIUM 9.2 8.9 8.5  MG  --  1.9 1.9  PHOS  --  3.1 2.9  PROT  --   --  5.2*  ALBUMIN  --   --  2.1*  AST  --   --  18  ALT  --   --  12  ALKPHOS  --   --  42  BILITOT  --   --  0.4  TRIG  --   --  100   Estimated Creatinine Clearance: 117.9 ml/min (by C-G formula based on Cr of 0.73).    Recent Labs  04/27/14 1951 04/28/14 0003 04/28/14 0325  GLUCAP 115* 115* 124*     Insulin Requirements in the past 24 hours:  4 units moderate SSI  Assessment: 68 YOF admitted 04/21/14 with abdominal pain.  Found to have ruptured appendix and taken to the OR on the same day for appendectomy.  NG tube was placed on 04/23/14 due to abdominal distention.  Abdominal films on 04/24/14 showed ileus.  He also developed RLQ abscess that required the placement of a drain by IR.  Pharmacy consulted to managing TPN for nutritional support.  GI: ileus  s/p appendectomy. TG WNL.  NG O/P decreasing, significant drainage and serous, +BS.  Will eventually need a colectomy. Endo: DM with A1c 6.9% - CBGs well controlled Lytes: K+ 3.6 (5 runs ordered), others WNL Renal: SCr 0.73 - D51/2NS at 80 ml/hr, decent UOP 0.4 ml/kg/hr Pulm: now on RA Cards: CAD / HTN - VSS, in Afib (CHADS2 = 2) - on IV Lopressor, diltiazem gtt - not a candidate for [facility] currently. Hepatobil: LFTs WNL, albumin WNL on admit and now low at 2.1 ID: Abx #8 - Invanz D#3 for localized peritonitis, afebrile, WBC WNL Best Practices: Lovenox, MC TPN Access: PICC placed 04/27/14 TPN day#: 1 (9/21 >> )  Current Nutrition:  TPN  Nutritional Goals:  2200-2300 kCal, 110-125 grams of protein per day   Plan:   - Increase Clinimix E 5/15 to 80 ml/hr (goal ~100 ml/hr) + IVFE at 10 ml/hr.  TPN will provide 1843 kCal and 96gm of protein daily. - Decrease IVF to 40 ml/hr when new TPN bag starts - Daily multivitamin and  trace elements - Continue moderate SSI Q4H - F/U TPN tolerance to increase to goal rate   Perry [name], PharmD, BCPS Pager:  [phone] 04/28/2014, 9:00 AM

## 2014-04-28 NOTE — Progress Notes (Signed)
VASCULAR LAB PRELIMINARY  PRELIMINARY  PRELIMINARY  PRELIMINARY  Bilateral lower extremity venous duplex completed.    Preliminary report:  Bilateral:  No evidence of DVT, superficial thrombosis, or Baker's Cyst.   Dorreen Valiente, RVS 04/28/2014, 11:29 AM

## 2014-04-28 NOTE — Progress Notes (Signed)
Patient ID: Perry [name], male   DOB: 09-17-1945, 68 y.o.   MRN: [id number] 7 Days Post-Op  Subjective: Pt feels much better today as far as pain goes.  He still has some, but much improved after drain placed.  No flatus yet, but feels bowels working more.  Objective: Vital signs in last 24 hours: Temp:  [97.7 F (36.5 C)-98.4 F (36.9 C)] 97.8 F (36.6 C) (09/22 0400) Pulse Rate:  [65-115] 66 (09/22 0600) Resp:  [11-24] 13 (09/22 0600) BP: (110-147)/(76-103) 123/82 mmHg (09/22 0600) SpO2:  [93 %-99 %] 96 % (09/22 0600) Weight:  [255 lb 8.2 oz (115.9 kg)] 255 lb 8.2 oz (115.9 kg) (09/21 1847) Last BM Date: 04/25/14  Intake/Output from previous day: 09/21 0701 - 09/22 0700 In: 862.4 [P.O.:120; I.V.:54.9; NG/GT:60; IV Piggyback:100; TPN:512.5] Out: 5956 [Urine:1150; Emesis/NG output:150; Drains:255] Intake/Output this shift:    PE: Heart: rate controlled, but irregularly irregular Lungs: CTAB Abd: still distended, but slightly softer, more BS today, incisions are c/d/i with dermabond and still ecchymosis. Drain this morning with cloudy serous appearing output.  It does not appear frankly enteric or feculent as it did yesterday.   Lab Results:   Recent Labs  04/27/14 0910 04/28/14 0342  WBC 9.4 7.1  HGB 14.2 12.1*  HCT 41.8 35.5*  PLT 202 232   BMET  Recent Labs  04/27/14 0910 04/28/14 0342  NA 137 140  K 3.9 3.6*  CL 105 107  CO2 20 25  GLUCOSE 152* 136*  BUN 32* 26*  CREATININE 0.77 0.73  CALCIUM 8.9 8.5   PT/INR  Recent Labs  04/27/14 0915  LABPROT 17.9*  INR 1.48   CMP     Component Value Date/Time   NA 140 04/28/2014 0342   K 3.6* 04/28/2014 0342   CL 107 04/28/2014 0342   CO2 25 04/28/2014 0342   GLUCOSE 136* 04/28/2014 0342   BUN 26* 04/28/2014 0342   CREATININE 0.73 04/28/2014 0342   CALCIUM 8.5 04/28/2014 0342   PROT 5.2* 04/28/2014 0342   ALBUMIN 2.1* 04/28/2014 0342   AST 18 04/28/2014 0342   ALT 12 04/28/2014 0342   ALKPHOS 42 04/28/2014  0342   BILITOT 0.4 04/28/2014 0342   GFRNONAA >90 04/28/2014 0342   GFRAA >90 04/28/2014 0342   Lipase     Component Value Date/Time   LIPASE 22 04/20/2014 2032       Studies/Results: Ct Abdomen Pelvis W Contrast  04/26/2014   CLINICAL DATA:  Postop appendectomy. Increasing right lower quadrant pain. Ileus.  EXAM: CT ABDOMEN AND PELVIS WITH CONTRAST  TECHNIQUE: Multidetector CT imaging of the abdomen and pelvis was performed using the standard protocol following bolus administration of intravenous contrast.  CONTRAST:  134mL OMNIPAQUE IOHEXOL 300 MG/ML  SOLN  COMPARISON:  Abdominal radiographs, 04/24/2014. Abdominal pelvic CT, 04/21/2014  FINDINGS: There is a collection in the right lower quadrant measuring 9.7 cm x 5.8 cm by 7.5 cm. This collection contains some oral contrast. There is nondependent air within this collection. A loop of ileum lies contiguous with the medial margin of this collection. There is also tubular structure extending from the cecal tip that lies along the inferomedial margin of this collection. This appears to be residual appendix. There is in vascular clips along the distal margin of this. It measures approximately 6.4 cm in length.  Inflammatory changes are noted throughout the right abdomen extending from the mid abdomen just below the inferior liver edge to the right pelvis. There  is thickening of the lateral Conal and anterior pararenal fascia. A small amount of free fluid collects within the pelvis and within the use of the small bowel mesentery.  There is mild dilation of loops of proximal small bowel consistent with a mild adynamic ileus. The colon is mostly decompressed. There is no evidence of obstruction.  No free air.  Minimal right effusion. Lung base atelectasis, most evident in the right lower lobe.  Fatty infiltration of the liver. Spleen is unremarkable. There is material increased attenuation in the gallbladder which is likely some contrast excreted in the bile.  Gallbladder otherwise unremarkable. No bile duct dilation. Normal pancreas. No adrenal masses.  Stable low-density lesions in the right kidney consistent with cysts. Tiny low-density lesion in the lower pole left kidney is also stable in likely a cyst. No hydronephrosis. Normal ureters. Bladder is unremarkable. Prostate gland is enlarged.  No adenopathy.  IMPRESSION: 1. Right lower quadrant collection measuring 9.7 cm x 5.8 cm x 7.5 cm. This collection contains oral contrast and nondependent air. It may communicate with a directly adjacent loop of ileum or the inferior margin of the right colon, proximal to the ileocolic junction. There are surrounding inflammatory changes. 2. A residual appendix is evident with a clip along its inferior margin. This measures 8 mm in diameter and approximately 6.4 cm in length. 3. Small amount of ascites. 4. Mild small bowel adynamic ileus. Colon mostly decompressed. No obstruction. 5. Minimal right effusion and lung base atelectasis, most notable on the right.   Electronically Signed   By: Lajean Manes M.D.   On: 04/26/2014 18:27   Dg Chest Port 1 View  04/27/2014   CLINICAL DATA:  PICC placement  EXAM: PORTABLE CHEST - 1 VIEW  COMPARISON:  04/27/2014 at 1311 hr  FINDINGS: Right arm PICC terminates cavoatrial junction.  Lungs are clear.  No pleural effusion or pneumothorax.  Cardiomegaly.  Postsurgical changes related to prior CABG.  Enteric tube terminates in the gastric cardia with its side port at the GE junction.  IMPRESSION: Right arm PICC terminates at the cavoatrial junction.   Electronically Signed   By: Julian Hy M.D.   On: 04/27/2014 18:10   Dg Chest Port 1 View  04/27/2014   CLINICAL DATA:  New onset atrial fibrillation  EXAM: PORTABLE CHEST - 1 VIEW  COMPARISON:  Radiograph 01/22/2014  FINDINGS: Sternotomy wires overlie normal cardiac silhouette. NG tube extends below the diaphragms. No pulmonary edema. No pneumothorax.  IMPRESSION: No acute  cardiopulmonary process.   Electronically Signed   By: Suzy Bouchard M.D.   On: 04/27/2014 13:25   Ct Image Guided Drainage By Percutaneous Catheter  04/27/2014   CLINICAL DATA:  68 year old male with a history of acute appendicitis status post laparoscopic appendectomy. CT imaging from 04/26/2014 demonstrates a complex fluid collection in the right lower quadrant adjacent to the surgical site concerning for post appendectomy abscess.  EXAM: CT IMAGE GUIDED DRAINAGE BY PERCUTANEOUS CATHETER  Date: 04/27/2014  PROCEDURE: 1. CT-guided placement of a percutaneous drain in the right lower quadrant fluid collection Interventional Radiologist:  Criselda Peaches, MD  ANESTHESIA/SEDATION: Moderate (conscious) sedation was used. 0.5 mg Versed, 25 mcg Fentanyl were administered intravenously. The patient's vital signs were monitored continuously by radiology nursing throughout the procedure.  Sedation Time: 17 minutes  TECHNIQUE: Informed consent was obtained from the patient following explanation of the procedure, risks, benefits and alternatives. The patient understands, agrees and consents for the procedure. All questions were addressed.  A time out was performed.  A planning axial CT scan was performed. The fluid collection in the right lower quadrant adjacent to the appendiceal stump was identified. A suitable skin entry site was selected and marked. Local anesthesia was attained by infiltration with 1% lidocaine. A small dermatotomy was made. Using intermittent CT fluoroscopic guidance, an 18 gauge trocar needle was advanced through the antral lateral abdominal wall and into the fluid collection. A short Amplatz wire was then coiled within the fluid collection. The tract was dilated to 36 [place] and a Cook 12 [place] all-purpose drainage catheter was advanced over the wire and formed within the fluid collection. Approximately 175 mL of brown, foul smelling thin fluid was aspirated. A sample was sent for culture.  The tube was then connected to JP bulb suction and secured to the skin with 0 Prolene suture. A post aspiration CT scan was performed demonstrating near-total resolution of the complex fluid and gas collection.  The patient tolerated the procedure very well.  COMPLICATIONS: None  IMPRESSION: 1. Successful placement of a 17 French percutaneous drain in the right lower quadrant fluid collection. Approximately 175 mL of thin, brown foul-smelling fluid was aspirated. A sample was sent for Gram stain and culture.  PLAN: 1. Maintain tube to JP bulb suction. Once drain output is minimal for more than 48 hr, leukocytosis and fever are resolved consideration can be given to drain removal. Recommend repeat imaging prior to drain removal. Signed,  Criselda Peaches, MD  [facility]  [facility]   Electronically Signed   By: Jacqulynn Cadet M.D.   On: 04/27/2014 15:28    Anti-infectives: Anti-infectives   Start     Dose/Rate Route Frequency Ordered Stop   04/26/14 2200  ertapenem (INVANZ) 1 g in sodium chloride 0.9 % 50 mL IVPB     1 g 100 mL/hr over 30 Minutes Intravenous Every 24 hours 04/26/14 2135     04/22/14 0300  cefTRIAXone (ROCEPHIN) 1 g in dextrose 5 % 50 mL IVPB  Status:  Discontinued     1 g 100 mL/hr over 30 Minutes Intravenous Every 24 hours 04/21/14 0514 04/26/14 2135   04/21/14 1200  metroNIDAZOLE (FLAGYL) IVPB 500 mg  Status:  Discontinued     500 mg 100 mL/hr over 60 Minutes Intravenous Every 8 hours 04/21/14 0514 04/27/14 1058   04/21/14 0130  cefTRIAXone (ROCEPHIN) 1 g in dextrose 5 % 50 mL IVPB     1 g 100 mL/hr over 30 Minutes Intravenous  Once 04/21/14 0120 04/21/14 0347   04/21/14 0130  metroNIDAZOLE (FLAGYL) IVPB 500 mg     500 mg 100 mL/hr over 60 Minutes Intravenous  Once 04/21/14 0120 04/21/14 0509       Assessment/Plan   1. POD 7, s/p lap appendectomy for perforated appendicitis  2. RLQ abscess with presumed leak  3.  Retained appendix  4. PCM/TNA 5. HTN  6. New onset A fib 7. Hypokalemia  Plan: 1. Patient feeling better this morning with drain placement.  Drain appears to be cloudy serous this morning and not frankly enteric contents.  Will hold off on return to OR at least for today and continue to follow the content and character of the drain.  If this changes, the patient will likely need a right colectomy.  This was discussed with the patient today. 2. Cont TNA and NPO.  Await bowel function and await resolution of leak 3. Replace K 4. Check labs in  am 5. Mobilize and pulm toilet 6. Cont Invanz (D3) (5 days prior of Cefipime and Flagyl) 7. Cont dilt drip per Dr. Wynonia Lawman. 8.  Lovenox/SCDs   LOS: 8 days    Claudio,Tymira Horkey E 04/28/2014, 7:43 AM Pager: [phone]

## 2014-04-29 LAB — CBC
HEMATOCRIT: 36.2 % — AB (ref 39.0–52.0)
HEMOGLOBIN: 12.3 g/dL — AB (ref 13.0–17.0)
MCH: 31 pg (ref 26.0–34.0)
MCHC: 34 g/dL (ref 30.0–36.0)
MCV: 91.2 fL (ref 78.0–100.0)
Platelets: 265 10*3/uL (ref 150–400)
RBC: 3.97 MIL/uL — ABNORMAL LOW (ref 4.22–5.81)
RDW: 13.4 % (ref 11.5–15.5)
WBC: 7.4 10*3/uL (ref 4.0–10.5)

## 2014-04-29 LAB — BASIC METABOLIC PANEL
Anion gap: 11 (ref 5–15)
BUN: 24 mg/dL — AB (ref 6–23)
CO2: 24 mEq/L (ref 19–32)
Calcium: 8.4 mg/dL (ref 8.4–10.5)
Chloride: 103 mEq/L (ref 96–112)
Creatinine, Ser: 0.72 mg/dL (ref 0.50–1.35)
Glucose, Bld: 134 mg/dL — ABNORMAL HIGH (ref 70–99)
POTASSIUM: 3.6 meq/L — AB (ref 3.7–5.3)
SODIUM: 138 meq/L (ref 137–147)

## 2014-04-29 LAB — GLUCOSE, CAPILLARY
GLUCOSE-CAPILLARY: 120 mg/dL — AB (ref 70–99)
Glucose-Capillary: 118 mg/dL — ABNORMAL HIGH (ref 70–99)

## 2014-04-29 MED ORDER — FAT EMULSION 20 % IV EMUL
250.0000 mL | INTRAVENOUS | Status: AC
  Administered 2014-04-29: 250 mL via INTRAVENOUS
  Filled 2014-04-29: qty 250

## 2014-04-29 MED ORDER — DEXTROSE-NACL 5-0.45 % IV SOLN
INTRAVENOUS | Status: DC
  Administered 2014-04-29: 09:00:00 via INTRAVENOUS
  Filled 2014-04-29 (×3): qty 1000

## 2014-04-29 MED ORDER — SODIUM CHLORIDE 0.9 % IV SOLN
1.0000 g | Freq: Three times a day (TID) | INTRAVENOUS | Status: DC
  Administered 2014-04-29 – 2014-05-04 (×15): 1 g via INTRAVENOUS
  Filled 2014-04-29 (×18): qty 1

## 2014-04-29 MED ORDER — TRACE MINERALS CR-CU-F-FE-I-MN-MO-SE-ZN IV SOLN
INTRAVENOUS | Status: AC
  Administered 2014-04-29: 18:00:00 via INTRAVENOUS
  Filled 2014-04-29: qty 3000

## 2014-04-29 MED ORDER — POTASSIUM CHLORIDE 10 MEQ/50ML IV SOLN
10.0000 meq | INTRAVENOUS | Status: AC
  Administered 2014-04-29 (×6): 10 meq via INTRAVENOUS
  Filled 2014-04-29: qty 50

## 2014-04-29 NOTE — Progress Notes (Signed)
PARENTERAL NUTRITION CONSULT NOTE - FOLLOW UP  Pharmacy Consult:  TPN Indication:  Prolonged ileus  Allergies  Allergen Reactions  . Xanax [Alprazolam] Shortness Of Breath    Patient Measurements: Height: 6\' 1"  (185.4 cm) Weight: 255 lb 8.2 oz (115.9 kg) IBW/kg (Calculated) : 79.9 AdjBW = 89 kg  Vital Signs: Temp: 98.4 F (36.9 C) (09/23 0526) Temp src: Oral (09/23 0526) BP: 121/82 mmHg (09/23 0526) Pulse Rate: 70 (09/23 0526) Intake/Output from previous day: 09/22 0701 - 09/23 0700 In: 1168 [I.V.:105; TPN:1060] Out: 910 [Urine:550; Emesis/NG output:340; Drains:20]  Labs:  Recent Labs  04/27/14 0910 04/27/14 0915 04/28/14 0342 04/29/14 0504  WBC 9.4  --  7.1 7.4  HGB 14.2  --  12.1* 12.3*  HCT 41.8  --  35.5* 36.2*  PLT 202  --  232 265  APTT  --  31  --   --   INR  --  1.48  --   --      Recent Labs  04/27/14 0910 04/28/14 0342 04/29/14 0504  NA 137 140 138  K 3.9 3.6* 3.6*  CL 105 107 103  CO2 20 25 24   GLUCOSE 152* 136* 134*  BUN 32* 26* 24*  CREATININE 0.77 0.73 0.72  CALCIUM 8.9 8.5 8.4  MG 1.9 1.9  --   PHOS 3.1 2.9  --   PROT  --  5.2*  --   ALBUMIN  --  2.1*  --   AST  --  18  --   ALT  --  12  --   ALKPHOS  --  42  --   BILITOT  --  0.4  --   PREALBUMIN  --  8.0*  --   TRIG  --  100  --    Estimated Creatinine Clearance: 117.9 ml/min (by C-G formula based on Cr of 0.72).    Recent Labs  04/28/14 0003 04/28/14 0325 04/28/14 1146  GLUCAP 115* 124* 109*     Insulin Requirements in the past 24 hours:  8 units moderate SSI  Assessment: 68 YOF admitted 04/21/14 with abdominal pain.  Found to have ruptured appendix and taken to the OR on the same day for appendectomy.  NG tube was placed on 04/23/14 due to abdominal distention.  Abdominal films on 04/24/14 showed ileus.  He also developed RLQ abscess that required the placement of a drain by IR.  Pharmacy consulted to manage TPN for nutritional support.  GI: ileus s/p appendectomy  for perf appendicitis. Baseline prealbumin low at 8; TG WNL.  NG and drain O/P improving; serous drainage on 9/22 and continued (may need to hold Lovenox), +BS.  Awaiting resolution of leak; may need a colectomy per Surgery. Endo: DM with A1c 6.9% - CBGs well controlled Lytes: K+ 3.6 (goal ~4 for ileus), others WNL Renal: SCr 0.72 (stable) - D51/2NS at 80 ml/hr, UOP decreasing Pulm: stable on RA Neuro: receiving PRN Dilaudid Cards: CAD / HTN - VSS, Afib resolved (CHADS2 = 2) - on IV Lopressor, diltiazem gtt - not a candidate for [facility] currently.  Doppler negative for DVT. Hepatobil: LFTs WNL, albumin WNL on admit and now low at 2.1 ID: Abx #9 - Invanz D#4 for localized peritonitis, afebrile, WBC WNL Best Practices: Lovenox, MC TPN Access: PICC placed 04/27/14 TPN day#: 2 (9/21 >> )  Current Nutrition:  TPN  Nutritional Goals:  2200-2500 kCal, 120-140 grams of protein per day   Plan:   - Increase Clinimix E 5/15 to  100 ml/hr + IVFE at 10 ml/hr.  TPN will provide 2184 kCal and 120 gm protein daily, meeting 99% of minimal kCal and 100% of minimal protein needs. - Decrease IVF to [facility] - Daily multivitamin and trace elements - Continue moderate SSI Q4H - KCL x 6 runs - F/U AM labs     Chantell Kunkler D. Mina Marble, PharmD, BCPS Pager:  [phone] 04/29/2014, 7:45 AM

## 2014-04-29 NOTE — Progress Notes (Signed)
Subjective:  Still doing well.  No chest pain.  C/o mild SOB when takes pain meds.  Objective:  Vital Signs in the last 24 hours: BP 123/87  Pulse 65  Temp(Src) 97.9 F (36.6 C) (Oral)  Resp 15  Ht 6\' 1"  (1.854 m)  Wt 115.9 kg (255 lb 8.2 oz)  BMI 33.72 kg/m2  SpO2 96%  Physical Exam: Pleasant WM in NAD Lungs:  Clear  Cardiac:  irregular rhythm, normal S1 and S2, no S3 Extremities:  No edema present  Intake/Output from previous day: 09/22 0701 - 09/23 0700 In: 1168 [I.V.:105; TPN:1060] Out: 910 [Urine:550; Emesis/NG output:340; Drains:20] Weight Filed Weights   04/20/14 2030 04/21/14 0347 04/27/14 1847  Weight: 111.131 kg (245 lb) 110.043 kg (242 lb 9.6 oz) 115.9 kg (255 lb 8.2 oz)    Lab Results: Basic Metabolic Panel:  Recent Labs  04/28/14 0342 04/29/14 0504  NA 140 138  K 3.6* 3.6*  CL 107 103  CO2 25 24  GLUCOSE 136* 134*  BUN 26* 24*  CREATININE 0.73 0.72    CBC:  Recent Labs  04/26/14 1200  04/28/14 0342 04/29/14 0504  WBC 9.2  < > 7.1 7.4  NEUTROABS 7.9*  --  5.2  --   HGB 12.8*  < > 12.1* 12.3*  HCT 38.3*  < > 35.5* 36.2*  MCV 93.6  < > 91.5 91.2  PLT 206  < > 232 265  < > = values in this interval not displayed.  BNP    Component Value Date/Time   PROBNP 64.0 03/21/2009 2355   Telemetry: Atrial fibrillation with controlled response  Assessment/Plan:  1. Atrial fibrillation new onset now rate controlled (since at least 9/15) 2. CAD with prior IMI and CABG emergent - stable 3. Appendicular abcess being drained  Rec:  Continue rate control with IV diltiazem and beta blocker.  Anticoagulate when drains out and OK with surgery. Change to po meds when able to take by mouth.  Kerry Hough  MD [facility]  04/29/2014, 8:43 AM

## 2014-04-29 NOTE — Progress Notes (Signed)
Patient ID: Perry [name], male   DOB: Feb 23, 1946, 68 y.o.   MRN: [id number]   Referring Physician(s): Dr Donne Hazel  Subjective:  RLQ abscess drain placed 9/21 Up in chair Much less abd pain Still on heart monitor No complaints  Allergies: Xanax  Medications: Prior to Admission medications   Medication Sig Start Date End Date Taking? Authorizing Provider  aspirin 81 MG chewable tablet Chew 81 mg by mouth daily.   Yes Historical Provider, MD  bismuth subsalicylate (PEPTO BISMOL) 262 MG chewable tablet Chew 524 mg by mouth as needed for indigestion.   Yes Historical Provider, MD  fenofibrate (TRICOR) 145 MG tablet Take 145 mg by mouth daily.     Yes Historical Provider, MD  fish oil-omega-3 fatty acids 1000 MG capsule Take 2 g by mouth daily.     Yes Historical Provider, MD  lisinopril (PRINIVIL,ZESTRIL) 40 MG tablet Take 40 mg by mouth daily.     Yes Historical Provider, MD  metoprolol (TOPROL-XL) 50 MG 24 hr tablet Take 25 mg by mouth daily.    Yes Historical Provider, MD  rosuvastatin (CRESTOR) 5 MG tablet Take 5 mg by mouth daily.     Yes Historical Provider, MD    Review of Systems  Vital Signs: BP 123/87  Pulse 65  Temp(Src) 97.9 F (36.6 C) (Oral)  Resp 15  Ht 6' 1"  (1.854 m)  Wt 115.9 kg (255 lb 8.2 oz)  BMI 33.72 kg/m2  SpO2 96%  Physical Exam  Abdominal:  Abd soft; NT Except over RLQ drain site Output 20 cc yesterday; serous fluid Cx: many wbcs Afeb    Imaging: Ct Abdomen Pelvis W Contrast  04/26/2014   CLINICAL DATA:  Postop appendectomy. Increasing right lower quadrant pain. Ileus.  EXAM: CT ABDOMEN AND PELVIS WITH CONTRAST  TECHNIQUE: Multidetector CT imaging of the abdomen and pelvis was performed using the standard protocol following bolus administration of intravenous contrast.  CONTRAST:  138m OMNIPAQUE IOHEXOL 300 MG/ML  SOLN  COMPARISON:  Abdominal radiographs, 04/24/2014. Abdominal pelvic CT, 04/21/2014  FINDINGS: There is a collection in the  right lower quadrant measuring 9.7 cm x 5.8 cm by 7.5 cm. This collection contains some oral contrast. There is nondependent air within this collection. A loop of ileum lies contiguous with the medial margin of this collection. There is also tubular structure extending from the cecal tip that lies along the inferomedial margin of this collection. This appears to be residual appendix. There is in vascular clips along the distal margin of this. It measures approximately 6.4 cm in length.  Inflammatory changes are noted throughout the right abdomen extending from the mid abdomen just below the inferior liver edge to the right pelvis. There is thickening of the lateral Conal and anterior pararenal fascia. A small amount of free fluid collects within the pelvis and within the use of the small bowel mesentery.  There is mild dilation of loops of proximal small bowel consistent with a mild adynamic ileus. The colon is mostly decompressed. There is no evidence of obstruction.  No free air.  Minimal right effusion. Lung base atelectasis, most evident in the right lower lobe.  Fatty infiltration of the liver. Spleen is unremarkable. There is material increased attenuation in the gallbladder which is likely some contrast excreted in the bile. Gallbladder otherwise unremarkable. No bile duct dilation. Normal pancreas. No adrenal masses.  Stable low-density lesions in the right kidney consistent with cysts. Tiny low-density lesion in the lower pole left kidney is  also stable in likely a cyst. No hydronephrosis. Normal ureters. Bladder is unremarkable. Prostate gland is enlarged.  No adenopathy.  IMPRESSION: 1. Right lower quadrant collection measuring 9.7 cm x 5.8 cm x 7.5 cm. This collection contains oral contrast and nondependent air. It may communicate with a directly adjacent loop of ileum or the inferior margin of the right colon, proximal to the ileocolic junction. There are surrounding inflammatory changes. 2. A residual  appendix is evident with a clip along its inferior margin. This measures 8 mm in diameter and approximately 6.4 cm in length. 3. Small amount of ascites. 4. Mild small bowel adynamic ileus. Colon mostly decompressed. No obstruction. 5. Minimal right effusion and lung base atelectasis, most notable on the right.   Electronically Signed   By: Lajean Manes M.D.   On: 04/26/2014 18:27   Dg Chest Port 1 View  04/27/2014   CLINICAL DATA:  PICC placement  EXAM: PORTABLE CHEST - 1 VIEW  COMPARISON:  04/27/2014 at 1311 hr  FINDINGS: Right arm PICC terminates cavoatrial junction.  Lungs are clear.  No pleural effusion or pneumothorax.  Cardiomegaly.  Postsurgical changes related to prior CABG.  Enteric tube terminates in the gastric cardia with its side port at the GE junction.  IMPRESSION: Right arm PICC terminates at the cavoatrial junction.   Electronically Signed   By: Julian Hy M.D.   On: 04/27/2014 18:10   Dg Chest Port 1 View  04/27/2014   CLINICAL DATA:  New onset atrial fibrillation  EXAM: PORTABLE CHEST - 1 VIEW  COMPARISON:  Radiograph 01/22/2014  FINDINGS: Sternotomy wires overlie normal cardiac silhouette. NG tube extends below the diaphragms. No pulmonary edema. No pneumothorax.  IMPRESSION: No acute cardiopulmonary process.   Electronically Signed   By: Suzy Bouchard M.D.   On: 04/27/2014 13:25   Ct Image Guided Drainage By Percutaneous Catheter  04/27/2014   CLINICAL DATA:  68 year old male with a history of acute appendicitis status post laparoscopic appendectomy. CT imaging from 04/26/2014 demonstrates a complex fluid collection in the right lower quadrant adjacent to the surgical site concerning for post appendectomy abscess.  EXAM: CT IMAGE GUIDED DRAINAGE BY PERCUTANEOUS CATHETER  Date: 04/27/2014  PROCEDURE: 1. CT-guided placement of a percutaneous drain in the right lower quadrant fluid collection Interventional Radiologist:  Criselda Peaches, MD  ANESTHESIA/SEDATION: Moderate  (conscious) sedation was used. 0.5 mg Versed, 25 mcg Fentanyl were administered intravenously. The patient's vital signs were monitored continuously by radiology nursing throughout the procedure.  Sedation Time: 17 minutes  TECHNIQUE: Informed consent was obtained from the patient following explanation of the procedure, risks, benefits and alternatives. The patient understands, agrees and consents for the procedure. All questions were addressed. A time out was performed.  A planning axial CT scan was performed. The fluid collection in the right lower quadrant adjacent to the appendiceal stump was identified. A suitable skin entry site was selected and marked. Local anesthesia was attained by infiltration with 1% lidocaine. A small dermatotomy was made. Using intermittent CT fluoroscopic guidance, an 18 gauge trocar needle was advanced through the antral lateral abdominal wall and into the fluid collection. A short Amplatz wire was then coiled within the fluid collection. The tract was dilated to 53 [place] and a Cook 12 [place] all-purpose drainage catheter was advanced over the wire and formed within the fluid collection. Approximately 175 mL of brown, foul smelling thin fluid was aspirated. A sample was sent for culture. The tube was then connected to  JP bulb suction and secured to the skin with 0 Prolene suture. A post aspiration CT scan was performed demonstrating near-total resolution of the complex fluid and gas collection.  The patient tolerated the procedure very well.  COMPLICATIONS: None  IMPRESSION: 1. Successful placement of a 64 French percutaneous drain in the right lower quadrant fluid collection. Approximately 175 mL of thin, brown foul-smelling fluid was aspirated. A sample was sent for Gram stain and culture.  PLAN: 1. Maintain tube to JP bulb suction. Once drain output is minimal for more than 48 hr, leukocytosis and fever are resolved consideration can be given to drain removal. Recommend repeat  imaging prior to drain removal. Signed,  Criselda Peaches, MD  [facility]  [facility]   Electronically Signed   By: Jacqulynn Cadet M.D.   On: 04/27/2014 15:28    Labs: Results for orders placed during the hospital encounter of 04/20/14 (from the past 48 hour(s))  BODY FLUID CULTURE     Status: None   Collection Time    04/27/14 12:53 PM      Result Value Ref Range   Specimen Description PERITONEAL CAVITY     Special Requests RLQ INTRA ABDOMINAL FLUID COLLECTION     Gram Stain       Value: MODERATE WBC PRESENT,BOTH PMN AND MONONUCLEAR     RARE GRAM NEGATIVE RODS     Performed at [facility]   Culture       Value: MODERATE PSEUDOMONAS AERUGINOSA     Performed at [facility]   Report Status PENDING    GLUCOSE, CAPILLARY     Status: Abnormal   Collection Time    04/27/14  1:43 PM      Result Value Ref Range   Glucose-Capillary 120 (*) 70 - 99 mg/dL  TROPONIN I     Status: None   Collection Time    04/27/14  2:29 PM      Result Value Ref Range   Troponin I <0.30  <0.30 ng/mL   Comment:            Due to the release kinetics of cTnI,     a negative result within the first hours     of the onset of symptoms does not rule out     myocardial infarction with certainty.     If myocardial infarction is still suspected,     repeat the test at appropriate intervals.  GLUCOSE, CAPILLARY     Status: Abnormal   Collection Time    04/27/14  3:43 PM      Result Value Ref Range   Glucose-Capillary 127 (*) 70 - 99 mg/dL  GLUCOSE, CAPILLARY     Status: Abnormal   Collection Time    04/27/14  7:51 PM      Result Value Ref Range   Glucose-Capillary 115 (*) 70 - 99 mg/dL  GLUCOSE, CAPILLARY     Status: Abnormal   Collection Time    04/28/14 12:03 AM      Result Value Ref Range   Glucose-Capillary 115 (*) 70 - 99 mg/dL  GLUCOSE, CAPILLARY     Status: Abnormal   Collection Time    04/28/14  3:25 AM      Result Value  Ref Range   Glucose-Capillary 124 (*) 70 - 99 mg/dL  COMPREHENSIVE METABOLIC PANEL     Status: Abnormal   Collection Time    04/28/14  3:42 AM  Result Value Ref Range   Sodium 140  137 - 147 mEq/L   Potassium 3.6 (*) 3.7 - 5.3 mEq/L   Chloride 107  96 - 112 mEq/L   CO2 25  19 - 32 mEq/L   Glucose, Bld 136 (*) 70 - 99 mg/dL   BUN 26 (*) 6 - 23 mg/dL   Creatinine, Ser 0.73  0.50 - 1.35 mg/dL   Calcium 8.5  8.4 - 10.5 mg/dL   Total Protein 5.2 (*) 6.0 - 8.3 g/dL   Albumin 2.1 (*) 3.5 - 5.2 g/dL   AST 18  0 - 37 U/L   ALT 12  0 - 53 U/L   Alkaline Phosphatase 42  39 - 117 U/L   Total Bilirubin 0.4  0.3 - 1.2 mg/dL   GFR calc non Af Amer >90  >90 mL/min   GFR calc Af Amer >90  >90 mL/min   Comment: (NOTE)     The eGFR has been calculated using the CKD EPI equation.     This calculation has not been validated in all clinical situations.     eGFR's persistently <90 mL/min signify possible Chronic Kidney     Disease.   Anion gap 8  5 - 15  PREALBUMIN     Status: Abnormal   Collection Time    04/28/14  3:42 AM      Result Value Ref Range   Prealbumin 8.0 (*) 17.0 - 34.0 mg/dL   Comment: Performed at [facility]     Status: None   Collection Time    04/28/14  3:42 AM      Result Value Ref Range   Magnesium 1.9  1.5 - 2.5 mg/dL  PHOSPHORUS     Status: None   Collection Time    04/28/14  3:42 AM      Result Value Ref Range   Phosphorus 2.9  2.3 - 4.6 mg/dL  TRIGLYCERIDES     Status: None   Collection Time    04/28/14  3:42 AM      Result Value Ref Range   Triglycerides 100  <150 mg/dL  CBC     Status: Abnormal   Collection Time    04/28/14  3:42 AM      Result Value Ref Range   WBC 7.1  4.0 - 10.5 K/uL   RBC 3.88 (*) 4.22 - 5.81 MIL/uL   Hemoglobin 12.1 (*) 13.0 - 17.0 g/dL   HCT 35.5 (*) 39.0 - 52.0 %   MCV 91.5  78.0 - 100.0 fL   MCH 31.2  26.0 - 34.0 pg   MCHC 34.1  30.0 - 36.0 g/dL   RDW 13.7  11.5 - 15.5 %   Platelets 232  150 - 400 K/uL    DIFFERENTIAL     Status: None   Collection Time    04/28/14  3:42 AM      Result Value Ref Range   Neutrophils Relative % 73  43 - 77 %   Lymphocytes Relative 15  12 - 46 %   Monocytes Relative 9  3 - 12 %   Eosinophils Relative 3  0 - 5 %   Basophils Relative 0  0 - 1 %   Neutro Abs 5.2  1.7 - 7.7 K/uL   Lymphs Abs 1.1  0.7 - 4.0 K/uL   Monocytes Absolute 0.6  0.1 - 1.0 K/uL   Eosinophils Absolute 0.2  0.0 - 0.7 K/uL  Basophils Absolute 0.0  0.0 - 0.1 K/uL  GLUCOSE, CAPILLARY     Status: Abnormal   Collection Time    04/28/14 11:46 AM      Result Value Ref Range   Glucose-Capillary 109 (*) 70 - 99 mg/dL   Comment 1 Notify RN     Comment 2 Documented in Chart    CBC     Status: Abnormal   Collection Time    04/29/14  5:04 AM      Result Value Ref Range   WBC 7.4  4.0 - 10.5 K/uL   RBC 3.97 (*) 4.22 - 5.81 MIL/uL   Hemoglobin 12.3 (*) 13.0 - 17.0 g/dL   HCT 36.2 (*) 39.0 - 52.0 %   MCV 91.2  78.0 - 100.0 fL   MCH 31.0  26.0 - 34.0 pg   MCHC 34.0  30.0 - 36.0 g/dL   RDW 13.4  11.5 - 15.5 %   Platelets 265  150 - 400 K/uL  BASIC METABOLIC PANEL     Status: Abnormal   Collection Time    04/29/14  5:04 AM      Result Value Ref Range   Sodium 138  137 - 147 mEq/L   Potassium 3.6 (*) 3.7 - 5.3 mEq/L   Chloride 103  96 - 112 mEq/L   CO2 24  19 - 32 mEq/L   Glucose, Bld 134 (*) 70 - 99 mg/dL   BUN 24 (*) 6 - 23 mg/dL   Creatinine, Ser 0.72  0.50 - 1.35 mg/dL   Calcium 8.4  8.4 - 10.5 mg/dL   GFR calc non Af Amer >90  >90 mL/min   GFR calc Af Amer >90  >90 mL/min   Comment: (NOTE)     The eGFR has been calculated using the CKD EPI equation.     This calculation has not been validated in all clinical situations.     eGFR's persistently <90 mL/min signify possible Chronic Kidney     Disease.   Anion gap 11  5 - 15    Assessment and Plan:  RLQ abscess drain placed 9/21 Better; less pain Output slowing Will follow     I spent a total of 15 minutes face to face  in clinical consultation/evaluation, greater than 50% of which was counseling/coordinating care for RLQ abscess drain  Signed: Susane Bey A 04/29/2014, 10:06 AM

## 2014-04-29 NOTE — Progress Notes (Signed)
8 Days Post-Op  Subjective: Patient is sore in his RLQ around the drain when he moves, but otherwise comfortable. No flatus or BM, but he feels a lot of "rumbling" Drain - minimal serous output  Objective: Vital signs in last 24 hours: Temp:  [97.3 F (36.3 C)-98.4 F (36.9 C)] 98.4 F (36.9 C) (09/23 0526) Pulse Rate:  [65-89] 65 (09/23 0725) Resp:  [13-19] 15 (09/23 0725) BP: (121-128)/(74-92) 123/87 mmHg (09/23 0725) SpO2:  [96 %-98 %] 96 % (09/23 0725) Last BM Date: 04/25/14  Intake/Output from previous day: 09/22 0701 - 09/23 0700 In: 1168 [I.V.:105; TPN:1060] Out: 910 [Urine:550; Emesis/NG output:340; Drains:20] Intake/Output this shift:    General appearance: alert, cooperative and no distress Resp: clear to auscultation bilaterally Cardio: regular rate and rhythm, S1, S2 normal, no murmur, click, rub or gallop GI: active bowel sounds, mildly distended; tender RLQ Incisions - some ecchymosis noted around incisions; Drain - minimal yellow serous drainage C/o some tenderness at his sacrum.  Unable to examine at this time since patient is sitting in chair.  Spoke with nurse - she will evaluate when he gets back in bed. Lab Results:   Recent Labs  04/28/14 0342 04/29/14 0504  WBC 7.1 7.4  HGB 12.1* 12.3*  HCT 35.5* 36.2*  PLT 232 265   BMET  Recent Labs  04/28/14 0342 04/29/14 0504  NA 140 138  K 3.6* 3.6*  CL 107 103  CO2 25 24  GLUCOSE 136* 134*  BUN 26* 24*  CREATININE 0.73 0.72  CALCIUM 8.5 8.4   PT/INR  Recent Labs  04/27/14 0915  LABPROT 17.9*  INR 1.48   ABG No results found for this basename: PHART, PCO2, PO2, HCO3,  in the last 72 hours  Studies/Results: Dg Chest Port 1 View  04/27/2014   CLINICAL DATA:  PICC placement  EXAM: PORTABLE CHEST - 1 VIEW  COMPARISON:  04/27/2014 at 1311 hr  FINDINGS: Right arm PICC terminates cavoatrial junction.  Lungs are clear.  No pleural effusion or pneumothorax.  Cardiomegaly.  Postsurgical changes  related to prior CABG.  Enteric tube terminates in the gastric cardia with its side port at the GE junction.  IMPRESSION: Right arm PICC terminates at the cavoatrial junction.   Electronically Signed   By: Julian Hy M.D.   On: 04/27/2014 18:10   Dg Chest Port 1 View  04/27/2014   CLINICAL DATA:  New onset atrial fibrillation  EXAM: PORTABLE CHEST - 1 VIEW  COMPARISON:  Radiograph 01/22/2014  FINDINGS: Sternotomy wires overlie normal cardiac silhouette. NG tube extends below the diaphragms. No pulmonary edema. No pneumothorax.  IMPRESSION: No acute cardiopulmonary process.   Electronically Signed   By: Suzy Bouchard M.D.   On: 04/27/2014 13:25   Ct Image Guided Drainage By Percutaneous Catheter  04/27/2014   CLINICAL DATA:  68 year old male with a history of acute appendicitis status post laparoscopic appendectomy. CT imaging from 04/26/2014 demonstrates a complex fluid collection in the right lower quadrant adjacent to the surgical site concerning for post appendectomy abscess.  EXAM: CT IMAGE GUIDED DRAINAGE BY PERCUTANEOUS CATHETER  Date: 04/27/2014  PROCEDURE: 1. CT-guided placement of a percutaneous drain in the right lower quadrant fluid collection Interventional Radiologist:  Criselda Peaches, MD  ANESTHESIA/SEDATION: Moderate (conscious) sedation was used. 0.5 mg Versed, 25 mcg Fentanyl were administered intravenously. The patient's vital signs were monitored continuously by radiology nursing throughout the procedure.  Sedation Time: 17 minutes  TECHNIQUE: Informed consent was obtained from  the patient following explanation of the procedure, risks, benefits and alternatives. The patient understands, agrees and consents for the procedure. All questions were addressed. A time out was performed.  A planning axial CT scan was performed. The fluid collection in the right lower quadrant adjacent to the appendiceal stump was identified. A suitable skin entry site was selected and marked. Local  anesthesia was attained by infiltration with 1% lidocaine. A small dermatotomy was made. Using intermittent CT fluoroscopic guidance, an 18 gauge trocar needle was advanced through the antral lateral abdominal wall and into the fluid collection. A short Amplatz wire was then coiled within the fluid collection. The tract was dilated to 29 [place] and a Cook 12 [place] all-purpose drainage catheter was advanced over the wire and formed within the fluid collection. Approximately 175 mL of brown, foul smelling thin fluid was aspirated. A sample was sent for culture. The tube was then connected to JP bulb suction and secured to the skin with 0 Prolene suture. A post aspiration CT scan was performed demonstrating near-total resolution of the complex fluid and gas collection.  The patient tolerated the procedure very well.  COMPLICATIONS: None  IMPRESSION: 1. Successful placement of a 37 French percutaneous drain in the right lower quadrant fluid collection. Approximately 175 mL of thin, brown foul-smelling fluid was aspirated. A sample was sent for Gram stain and culture.  PLAN: 1. Maintain tube to JP bulb suction. Once drain output is minimal for more than 48 hr, leukocytosis and fever are resolved consideration can be given to drain removal. Recommend repeat imaging prior to drain removal. Signed,  Criselda Peaches, MD  [facility]  [facility]   Electronically Signed   By: Jacqulynn Cadet M.D.   On: 04/27/2014 15:28    Anti-infectives: Anti-infectives   Start     Dose/Rate Route Frequency Ordered Stop   04/26/14 2200  ertapenem (INVANZ) 1 g in sodium chloride 0.9 % 50 mL IVPB     1 g 100 mL/hr over 30 Minutes Intravenous Every 24 hours 04/26/14 2135     04/22/14 0300  cefTRIAXone (ROCEPHIN) 1 g in dextrose 5 % 50 mL IVPB  Status:  Discontinued     1 g 100 mL/hr over 30 Minutes Intravenous Every 24 hours 04/21/14 0514 04/26/14 2135   04/21/14 1200   metroNIDAZOLE (FLAGYL) IVPB 500 mg  Status:  Discontinued     500 mg 100 mL/hr over 60 Minutes Intravenous Every 8 hours 04/21/14 0514 04/27/14 1058   04/21/14 0130  cefTRIAXone (ROCEPHIN) 1 g in dextrose 5 % 50 mL IVPB     1 g 100 mL/hr over 30 Minutes Intravenous  Once 04/21/14 0120 04/21/14 0347   04/21/14 0130  metroNIDAZOLE (FLAGYL) IVPB 500 mg     500 mg 100 mL/hr over 60 Minutes Intravenous  Once 04/21/14 0120 04/21/14 0509      Assessment/Plan: s/p Procedure(s): APPENDECTOMY LAPAROSCOPIC (N/A) Perforated appendicitis with pelvic abscess - s/p percutaneous drain on 9/18 Continue Invanz day 4 WBC normal - improved exam Sacral dressing per nursing - RN will evaluate for possible air mattress if needed Continue TNA for nutritional support until ileus resolved and taking PO's.   LOS: 9 days    Perry Kader K. 04/29/2014

## 2014-04-29 NOTE — Progress Notes (Signed)
[name] with 5 cc of NS. Serous drainage noted drain recharged. Patient tolerated well.

## 2014-04-30 LAB — PHOSPHORUS: Phosphorus: 3.2 mg/dL (ref 2.3–4.6)

## 2014-04-30 LAB — COMPREHENSIVE METABOLIC PANEL
ALBUMIN: 2.2 g/dL — AB (ref 3.5–5.2)
ALK PHOS: 43 U/L (ref 39–117)
ALT: 10 U/L (ref 0–53)
AST: 15 U/L (ref 0–37)
Anion gap: 10 (ref 5–15)
BUN: 20 mg/dL (ref 6–23)
CHLORIDE: 106 meq/L (ref 96–112)
CO2: 23 mEq/L (ref 19–32)
Calcium: 8.3 mg/dL — ABNORMAL LOW (ref 8.4–10.5)
Creatinine, Ser: 0.7 mg/dL (ref 0.50–1.35)
GFR calc non Af Amer: >90 mL/min (ref >90)
Glucose, Bld: 135 mg/dL — ABNORMAL HIGH (ref 70–99)
POTASSIUM: 3.9 meq/L (ref 3.7–5.3)
Sodium: 139 mEq/L (ref 137–147)
Total Bilirubin: 0.3 mg/dL (ref 0.3–1.2)
Total Protein: 5.4 g/dL — ABNORMAL LOW (ref 6.0–8.3)

## 2014-04-30 LAB — BODY FLUID CULTURE

## 2014-04-30 LAB — MAGNESIUM: Magnesium: 2 mg/dL (ref 1.5–2.5)

## 2014-04-30 MED ORDER — BISACODYL 10 MG RE SUPP
10.0000 mg | Freq: Once | RECTAL | Status: AC
  Administered 2014-04-30: 10 mg via RECTAL
  Filled 2014-04-30: qty 1

## 2014-04-30 MED ORDER — TRACE MINERALS CR-CU-F-FE-I-MN-MO-SE-ZN IV SOLN
INTRAVENOUS | Status: AC
  Administered 2014-04-30: 17:00:00 via INTRAVENOUS
  Filled 2014-04-30: qty 3000

## 2014-04-30 MED ORDER — FAT EMULSION 20 % IV EMUL
250.0000 mL | INTRAVENOUS | Status: AC
  Administered 2014-04-30: 250 mL via INTRAVENOUS
  Filled 2014-04-30: qty 250

## 2014-04-30 NOTE — Progress Notes (Signed)
Pt to [place] tro 3W-21, VSS, called report. Family bedside & aware of [place].

## 2014-04-30 NOTE — Progress Notes (Signed)
Subjective:  Still doing well.  No chest pain.  Remains in atrial fib with controlled response Objective:  Vital Signs in the last 24 hours: BP 130/78  Pulse 79  Temp(Src) 98.5 F (36.9 C) (Oral)  Resp 17  Ht 6\' 1"  (1.854 m)  Wt 115.9 kg (255 lb 8.2 oz)  BMI 33.72 kg/m2  SpO2 97%  Physical Exam: Pleasant WM in NAD Lungs:  Clear  Cardiac:  irregular rhythm, normal S1 and S2, no S3 Extremities:  No edema present  Intake/Output from previous day: 09/23 0701 - 09/24 0700 In: 1895 [I.V.:475; NG/GT:50; IV Piggyback:20; TPN:1320] Out: 1705 [Urine:1600; Emesis/NG output:50; Drains:55] Weight Filed Weights   04/20/14 2030 04/21/14 0347 04/27/14 1847  Weight: 111.131 kg (245 lb) 110.043 kg (242 lb 9.6 oz) 115.9 kg (255 lb 8.2 oz)    Lab Results: Basic Metabolic Panel:  Recent Labs  04/29/14 0504 04/30/14 0343  NA 138 139  K 3.6* 3.9  CL 103 106  CO2 24 23  GLUCOSE 134* 135*  BUN 24* 20  CREATININE 0.72 0.70    CBC:  Recent Labs  04/28/14 0342 04/29/14 0504  WBC 7.1 7.4  NEUTROABS 5.2  --   HGB 12.1* 12.3*  HCT 35.5* 36.2*  MCV 91.5 91.2  PLT 232 265    BNP    Component Value Date/Time   PROBNP 64.0 03/21/2009 2355   Telemetry: Atrial fibrillation with controlled response  Assessment/Plan:  1. Atrial fibrillation new onset now rate controlled (since at least 9/15) 2. CAD with prior IMI and CABG emergent - stable 3. Appendicular abcess being drained  Rec:  Continue rate control with IV diltiazem and beta blocker.  Anticoagulate when drains out and OK with surgery. Change to po meds when able to take by mouth. Will likely do TEE cardioversion following all of this.   Check ECHO.  Kerry Hough  MD [facility]  04/30/2014, 9:15 AM

## 2014-04-30 NOTE — Progress Notes (Signed)
PARENTERAL NUTRITION CONSULT NOTE - FOLLOW UP  Pharmacy Consult:  TPN Indication:  Prolonged ileus  Allergies  Allergen Reactions  . Xanax [Alprazolam] Shortness Of Breath    Patient Measurements: Height: 6\' 1"  (185.4 cm) Weight: 255 lb 8.2 oz (115.9 kg) IBW/kg (Calculated) : 79.9 AdjBW = 89 kg  Vital Signs: Temp: 98.3 F (36.8 C) (09/24 0319) Temp src: Oral (09/24 0319) BP: 130/78 mmHg (09/24 0319) Pulse Rate: 79 (09/24 0319) Intake/Output from previous day: 09/23 0701 - 09/24 0700 In: 1895 [I.V.:475; NG/GT:50; IV Piggyback:20; TPN:1320] Out: 1415 [Urine:1325; Emesis/NG output:50; Drains:40]  Labs:  Recent Labs  04/27/14 0910 04/27/14 0915 04/28/14 0342 04/29/14 0504  WBC 9.4  --  7.1 7.4  HGB 14.2  --  12.1* 12.3*  HCT 41.8  --  35.5* 36.2*  PLT 202  --  232 265  APTT  --  31  --   --   INR  --  1.48  --   --      Recent Labs  04/27/14 0910 04/28/14 0342 04/29/14 0504 04/30/14 0343  NA 137 140 138 139  K 3.9 3.6* 3.6* 3.9  CL 105 107 103 106  CO2 20 25 24 23   GLUCOSE 152* 136* 134* 135*  BUN 32* 26* 24* 20  CREATININE 0.77 0.73 0.72 0.70  CALCIUM 8.9 8.5 8.4 8.3*  MG 1.9 1.9  --  2.0  PHOS 3.1 2.9  --  3.2  PROT  --  5.2*  --  5.4*  ALBUMIN  --  2.1*  --  2.2*  AST  --  18  --  15  ALT  --  12  --  10  ALKPHOS  --  42  --  43  BILITOT  --  0.4  --  0.3  PREALBUMIN  --  8.0*  --   --   TRIG  --  100  --   --    Estimated Creatinine Clearance: 117.9 ml/min (by C-G formula based on Cr of 0.7).    Recent Labs  04/28/14 1146 04/29/14 1748 04/29/14 1916  GLUCAP 109* 118* 120*     Insulin Requirements in the past 24 hours:  4 units moderate SSI  Assessment: 68 YOF admitted 04/21/14 with abdominal pain.  Found to have ruptured appendix and taken to the OR on the same day for appendectomy.  NG tube was placed on 04/23/14 due to abdominal distention.  Abdominal films on 04/24/14 showed ileus.  He also developed RLQ abscess that required the  placement of a drain by IR.  Pharmacy consulted to manage TPN for nutritional support.  GI: ileus s/p appendectomy for perf appendicitis. Baseline prealbumin low at 8; TG WNL.  NG and drain O/P improving; drain with minimal output.  No flatus or BM. Much less distended. To repeat CT scan this weekend to eval for drain removal.  Clamp NG tube, may DC later today if no nausea or distention. Dulcolax suppository x 1 Endo: DM with A1c 6.9% - CBGs well controlled Lytes: K+ 3.9 after 6 runs of k yesterday (goal ~4 for ileus), others WNL Renal: SCr 0.7 (stable) - D51/2NS at 20 ml/hr, UOP 0.5 ml/kg/hr Pulm: stable on RA Neuro: receiving PRN Dilaudid Cards: CAD / HTN - VSS, Afib resolved (CHADS2 = 2) - on IV Lopressor, diltiazem gtt - not a candidate for [facility] currently.  Doppler negative for DVT. Hepatobil: LFTs WNL, albumin WNL on admit and now low at 2.1 ID: Abx #[id number] -  meropenem D#2, changed to meropenem from ertapenem for pseudomonas  peritonitis, afebrile, WBC WNL Best Practices: Lovenox, MC TPN Access: PICC placed 04/27/14 TPN day#: 3 (9/21 >> )  Current Nutrition:  TPN  Nutritional Goals:  2200-2500 kCal, 120-140 grams of protein per day   Plan:   - continue Clinimix E 5/15 at 100 ml/hr + IVFE at 10 ml/hr.  TPN will provide 2184 kCal and 120 gm protein daily, meeting 99% of minimal kCal and 100% of minimal protein needs. - Daily multivitamin and trace elements - DC SSI and  CBGs - am bmet  Eudelia Bunch, Pharm.D. [phone] 04/30/2014 8:32 AM

## 2014-04-30 NOTE — Progress Notes (Signed)
Utilization review completed.  

## 2014-04-30 NOTE — Progress Notes (Signed)
9 Days Post-Op  Subjective: Much more comfortable No flatus, but feeling a lot of "rumbling" in his abdomen Much less distended  Objective: Vital signs in last 24 hours: Temp:  [97.6 F (36.4 C)-98.5 F (36.9 C)] 98.3 F (36.8 C) (09/24 0319) Pulse Rate:  [70-93] 79 (09/24 0319) Resp:  [13-17] 17 (09/24 0319) BP: (116-130)/(77-91) 130/78 mmHg (09/24 0319) SpO2:  [95 %-98 %] 97 % (09/24 0319) Last BM Date: 04/25/14  Intake/Output from previous day: 09/23 0701 - 09/24 0700 In: 1895 [I.V.:475; NG/GT:50; IV Piggyback:20; TPN:1320] Out: 1415 [Urine:1325; Emesis/NG output:50; Drains:40] Intake/Output this shift:    General appearance: alert, cooperative and no distress Resp: clear to auscultation bilaterally Cardio: irregularly irregular rhythm GI: soft, very active bowel sounds, minimal tenderness around drain Drain - minimal serous output; site clean  Lab Results:   Recent Labs  04/28/14 0342 04/29/14 0504  WBC 7.1 7.4  HGB 12.1* 12.3*  HCT 35.5* 36.2*  PLT 232 265   BMET  Recent Labs  04/29/14 0504 04/30/14 0343  NA 138 139  K 3.6* 3.9  CL 103 106  CO2 24 23  GLUCOSE 134* 135*  BUN 24* 20  CREATININE 0.72 0.70  CALCIUM 8.4 8.3*   PT/INR  Recent Labs  04/27/14 0915  LABPROT 17.9*  INR 1.48   ABG No results found for this basename: PHART, PCO2, PO2, HCO3,  in the last 72 hours  Studies/Results: No results found.  Anti-infectives: Anti-infectives   Start     Dose/Rate Route Frequency Ordered Stop   04/29/14 1600  meropenem (MERREM) 1 g in sodium chloride 0.9 % 100 mL IVPB     1 g 200 mL/hr over 30 Minutes Intravenous Every 8 hours 04/29/14 1410     04/26/14 2200  ertapenem (INVANZ) 1 g in sodium chloride 0.9 % 50 mL IVPB  Status:  Discontinued     1 g 100 mL/hr over 30 Minutes Intravenous Every 24 hours 04/26/14 2135 04/29/14 1410   04/22/14 0300  cefTRIAXone (ROCEPHIN) 1 g in dextrose 5 % 50 mL IVPB  Status:  Discontinued     1 g 100 mL/hr  over 30 Minutes Intravenous Every 24 hours 04/21/14 0514 04/26/14 2135   04/21/14 1200  metroNIDAZOLE (FLAGYL) IVPB 500 mg  Status:  Discontinued     500 mg 100 mL/hr over 60 Minutes Intravenous Every 8 hours 04/21/14 0514 04/27/14 1058   04/21/14 0130  cefTRIAXone (ROCEPHIN) 1 g in dextrose 5 % 50 mL IVPB     1 g 100 mL/hr over 30 Minutes Intravenous  Once 04/21/14 0120 04/21/14 0347   04/21/14 0130  metroNIDAZOLE (FLAGYL) IVPB 500 mg     500 mg 100 mL/hr over 60 Minutes Intravenous  Once 04/21/14 0120 04/21/14 0509      Assessment/Plan: s/p Procedure(s): APPENDECTOMY LAPAROSCOPIC (N/A) Cx - Pseudomonas - changed abx to Meropenem based on Pharmacy recs Repeat CT scan this weekend to evaluate for drain removal Clamp NG tube - may d/c later today if no nausea or distention One time dulcolax suppository' Transfer to telemetry floor  LOS: 10 days    Perry Lafont K. 04/30/2014

## 2014-04-30 NOTE — Progress Notes (Signed)
Subjective: Patient states his abdominal region is less tender today. He denies any current N/V.  Objective: Physical Exam: BP 139/77  Pulse 79  Temp(Src) 98.5 F (36.9 C) (Oral)  Resp 17  Ht 6\' 1"  (1.854 m)  Wt 255 lb 8.2 oz (115.9 kg)  BMI 33.72 kg/m2  SpO2 95%  General: A&Ox3, NAD Abd: Distended, hypo BS, NT, RLQ drain intact serous fluid with some purulent stranding in tubing. 24 hr output 55 cc (10 in JP bulb now)  Labs: CBC  Recent Labs  04/28/14 0342 04/29/14 0504  WBC 7.1 7.4  HGB 12.1* 12.3*  HCT 35.5* 36.2*  PLT 232 265   BMET  Recent Labs  04/29/14 0504 04/30/14 0343  NA 138 139  K 3.6* 3.9  CL 103 106  CO2 24 23  GLUCOSE 134* 135*  BUN 24* 20  CREATININE 0.72 0.70  CALCIUM 8.4 8.3*   LFT  Recent Labs  04/30/14 0343  PROT 5.4*  ALBUMIN 2.2*  AST 15  ALT 10  ALKPHOS 43  BILITOT 0.3   PT/INR No results found for this basename: LABPROT, INR,  in the last 72 hours   Studies/Results: No results found.  Assessment/Plan: S/p Lap appendectomy for perforated appendicitis  Post operative abscess s/p IR perc drain placed 9/21, Cx Pseudomonas on antibiotics wbc wnl, afebrile, good output Repeat CT later this weekend per [facility] Continue drain flushes TID and monitor daily output.     LOS: 10 days    Rockney Ghee 04/30/2014 12:58 PM

## 2014-04-30 NOTE — Progress Notes (Signed)
[facility]  2D Echocardiogram completed.  [place], RDCS 04/30/2014 12:30 PM

## 2014-05-01 ENCOUNTER — Encounter (HOSPITAL_COMMUNITY): Payer: Self-pay | Admitting: Radiology

## 2014-05-01 ENCOUNTER — Inpatient Hospital Stay (HOSPITAL_COMMUNITY): Payer: Medicare Other

## 2014-05-01 LAB — BASIC METABOLIC PANEL
ANION GAP: 11 (ref 5–15)
BUN: 16 mg/dL (ref 6–23)
CALCIUM: 8.7 mg/dL (ref 8.4–10.5)
CO2: 24 mEq/L (ref 19–32)
Chloride: 108 mEq/L (ref 96–112)
Creatinine, Ser: 0.7 mg/dL (ref 0.50–1.35)
GFR calc Af Amer: >90 mL/min (ref >90)
Glucose, Bld: 153 mg/dL — ABNORMAL HIGH (ref 70–99)
Potassium: 4.3 mEq/L (ref 3.7–5.3)
SODIUM: 143 meq/L (ref 137–147)

## 2014-05-01 LAB — GLUCOSE, CAPILLARY
GLUCOSE-CAPILLARY: 171 mg/dL — AB (ref 70–99)
Glucose-Capillary: 144 mg/dL — ABNORMAL HIGH (ref 70–99)

## 2014-05-01 MED ORDER — IOHEXOL 300 MG/ML  SOLN
25.0000 mL | INTRAMUSCULAR | Status: AC
  Administered 2014-05-01: 25 mL via ORAL

## 2014-05-01 MED ORDER — IOHEXOL 300 MG/ML  SOLN
100.0000 mL | Freq: Once | INTRAMUSCULAR | Status: AC | PRN
  Administered 2014-05-01: 100 mL via INTRAVENOUS

## 2014-05-01 MED ORDER — FAT EMULSION 20 % IV EMUL
250.0000 mL | INTRAVENOUS | Status: AC
  Administered 2014-05-01: 250 mL via INTRAVENOUS
  Filled 2014-05-01: qty 250

## 2014-05-01 MED ORDER — TRACE MINERALS CR-CU-F-FE-I-MN-MO-SE-ZN IV SOLN
INTRAVENOUS | Status: AC
  Administered 2014-05-01: 18:00:00 via INTRAVENOUS
  Filled 2014-05-01: qty 3000

## 2014-05-01 NOTE — Progress Notes (Signed)
10 Days Post-Op  Subjective: He seems to be doing well, still no flatus, OK with NG out.  Abdomen is still sore, clear colored serous fluid from the drain.    Had 3 small BM after suppository yesterday.  Objective: Vital signs in last 24 hours: Temp:  [97.5 F (36.4 C)-98.4 F (36.9 C)] 98.2 F (36.8 C) (09/25 0826) Pulse Rate:  [49-80] 68 (09/25 0826) Resp:  [15-16] 16 (09/25 0826) BP: (106-143)/(75-83) 136/82 mmHg (09/25 0826) SpO2:  [96 %-99 %] 97 % (09/25 0826) Weight:  [113.263 kg (249 lb 11.2 oz)-114.3 kg (251 lb 15.8 oz)] 113.263 kg (249 lb 11.2 oz) (09/25 0404) Last BM Date: 04/30/14 Nothing recorded from drain 1 Bm recorded NPO/nutrition/TNA Afebrile, VSS Bmp OK No films Intake/Output from previous day: 09/24 0701 - 09/25 0700 In: 1940 [I.V.:275; NG/GT:25; IV Piggyback:10; TPN:1620] Out: 2950 [Urine:2950] Intake/Output this shift:    General appearance: alert, cooperative and no distress Resp: clear to auscultation bilaterally GI: soft sore, port sites Ok.  ecchymosis from heparin injections.  Lab Results:   Recent Labs  04/29/14 0504  WBC 7.4  HGB 12.3*  HCT 36.2*  PLT 265    BMET  Recent Labs  04/30/14 0343 05/01/14 0522  NA 139 143  K 3.9 4.3  CL 106 108  CO2 23 24  GLUCOSE 135* 153*  BUN 20 16  CREATININE 0.70 0.70  CALCIUM 8.3* 8.7   PT/INR No results found for this basename: LABPROT, INR,  in the last 72 hours   Recent Labs Lab 04/28/14 0342 04/30/14 0343  AST 18 15  ALT 12 10  ALKPHOS 42 43  BILITOT 0.4 0.3  PROT 5.2* 5.4*  ALBUMIN 2.1* 2.2*     Lipase     Component Value Date/Time   LIPASE 22 04/20/2014 2032     Studies/Results: No results found.  Medications: . antiseptic oral rinse  7 mL Mouth Rinse q12n4p  . chlorhexidine  15 mL Mouth Rinse BID  . enoxaparin (LOVENOX) injection  40 mg Subcutaneous Q24H  . meropenem (MERREM) IV  1 g Intravenous Q8H  . metoprolol  5 mg Intravenous 4 times per day  .  pantoprazole (PROTONIX) IV  40 mg Intravenous QHS    Assessment/Plan Acute perforated appendicitis/Laparoscopic appendectomy Surgeon: Dr. Serita Grammes, 04/21/14.   Post op RLQ abscess with drain placement 04/27/14 by IR.  Dr Jacqulynn Cadet Pseudomonas - changed abx to Meropenem based on Pharmacy recs Post op ileus PCM on TNA Atrial fibrillation CAD with prior IMI/CABG Obesity  Dyslipidemia     Plan:   Day 13 of antibiotics, day 3 of Meropenem .  Continue TNA, give him some clears sips and advance diet slowly.  He is ok with NG out and ice.      LOS: 11 days    JENNINGS,WILLARD 05/01/2014  Minimal drain output Still with some mild RLQ soreness ? Ileus - slowly resolving  Will repeat CT scan this afternoon to evaluate abscess - ?remove drain  Imogene Burn. Georgette Dover, MD, [facility] Surgery  General/ Trauma Surgery  05/01/2014 10:56 AM

## 2014-05-01 NOTE — Plan of Care (Signed)
Problem: Phase II Progression Outcomes Goal: Vital signs stable Outcome: Progressing VSS.  Remains A. Fib on telemetry with HR in the 60s.  Has multiple infusions, see eMAR for documentation.  No acute events occurred this shift.  Will continue to monitor.

## 2014-05-01 NOTE — Progress Notes (Signed)
Subjective:  Still doing well.  No chest pain. C/o mild lower abdominal pain.  No BM yet.  Remains in atrial fib with controlled response  Objective:  Vital Signs in the last 24 hours: BP 128/83  Pulse 73  Temp(Src) 98.4 F (36.9 C) (Oral)  Resp 16  Ht 6\' 1"  (1.854 m)  Wt 113.263 kg (249 lb 11.2 oz)  BMI 32.95 kg/m2  SpO2 96%  Physical Exam: Pleasant WM in NAD Lungs:  Clear  Cardiac:  irregular rhythm, normal S1 and S2, no S3 Abdomen:  Bowel sounds active this am. Extremities:  No edema present  Intake/Output from previous day: 09/24 0701 - 09/25 0700 In: 1940 [I.V.:275; NG/GT:25; IV Piggyback:10; TPN:1620] Out: 2950 [Urine:2950] NG tube out now  Weight Filed Weights   04/27/14 1847 04/30/14 1710 05/01/14 0404  Weight: 115.9 kg (255 lb 8.2 oz) 114.3 kg (251 lb 15.8 oz) 113.263 kg (249 lb 11.2 oz)    Lab Results: Basic Metabolic Panel:  Recent Labs  04/30/14 0343 05/01/14 0522  NA 139 143  K 3.9 4.3  CL 106 108  CO2 23 24  GLUCOSE 135* 153*  BUN 20 16  CREATININE 0.70 0.70    CBC:  Recent Labs  04/29/14 0504  WBC 7.4  HGB 12.3*  HCT 36.2*  MCV 91.2  PLT 265    BNP    Component Value Date/Time   PROBNP 64.0 03/21/2009 2355   Telemetry: Atrial fibrillation with controlled response  Assessment/Plan:  1. Atrial fibrillation new onset now rate controlled (since at least 9/15) 2. CAD with prior IMI and CABG emergent - stable 3. Appendicular abcess being drained mild abdominal pain  Rec:  Continue rate control with IV diltiazem and beta blocker.   When able to take pos change back to oral therapy Anticoagulate when cleared by surgery and then TEE   W. Doristine Church  MD [facility] Cardiology  05/01/2014, 8:18 AM

## 2014-05-01 NOTE — Progress Notes (Signed)
PARENTERAL NUTRITION CONSULT NOTE - FOLLOW UP  Pharmacy Consult:  TPN Indication:  Prolonged ileus  Allergies  Allergen Reactions  . Xanax [Alprazolam] Shortness Of Breath    Patient Measurements: Height: 6\' 1"  (185.4 cm) Weight: 249 lb 11.2 oz (113.263 kg) IBW/kg (Calculated) : 79.9 AdjBW = 89 kg  Vital Signs: Temp: 98.4 F (36.9 C) (09/25 0404) Temp src: Oral (09/25 0404) BP: 128/83 mmHg (09/25 0617) Pulse Rate: 73 (09/25 0617) Intake/Output from previous day: 09/24 0701 - 09/25 0700 In: 1940 [I.V.:275; NG/GT:25; IV Piggyback:10; TPN:1620] Out: 2950 [Urine:2950]  Labs:  Recent Labs  04/29/14 0504  WBC 7.4  HGB 12.3*  HCT 36.2*  PLT 265     Recent Labs  04/29/14 0504 04/30/14 0343 05/01/14 0522  NA 138 139 143  K 3.6* 3.9 4.3  CL 103 106 108  CO2 24 23 24   GLUCOSE 134* 135* 153*  BUN 24* 20 16  CREATININE 0.72 0.70 0.70  CALCIUM 8.4 8.3* 8.7  MG  --  2.0  --   PHOS  --  3.2  --   PROT  --  5.4*  --   ALBUMIN  --  2.2*  --   AST  --  15  --   ALT  --  10  --   ALKPHOS  --  43  --   BILITOT  --  0.3  --    Estimated Creatinine Clearance: 116.6 ml/min (by C-G formula based on Cr of 0.7).    Recent Labs  04/29/14 1748 04/29/14 1916 05/01/14 0746  GLUCAP 118* 120* 171*     Insulin Requirements in the past 24 hours:  CBGs/SSI d/c'ed 9/24  Assessment: 68 YOF admitted 04/21/14 with abdominal pain.  Found to have ruptured appendix and taken to the OR on the same day for appendectomy.  NG tube was placed on 04/23/14 due to abdominal distention.  Abdominal films on 04/24/14 showed ileus.  He also developed RLQ abscess that required the placement of a drain by IR.  Pharmacy consulted to manage TPN for nutritional support.  GI: ileus s/p appendectomy for perf appendicitis. Baseline prealbumin low at 8; TG WNL.  NGT removed.  Repeat CT scan this weekend to eval for drain removal.  S/p Dulcolax suppository x 1 9/24, no BM yet Endo: DM with A1c 6.9% -  CBGs well controlled and SSI/CBG checks d/c'ed, now trending up Lytes: all WNL Renal: SCr 0.7 (stable) - D51/2NS at 20 ml/hr, good UOP 1.2 ml/kg/hr Neuro: receiving PRN Dilaudid, pain score 3 Cards: CAD / HTN - VSS, Afib (CHADS2 = 2) - rate controlled on IV Lopressor, diltiazem gtt - not a candidate for [facility] currently.  Doppler negative for DVT.  Will need TEE. Hepatobil: LFTs WNL, albumin WNL on admit and now low at 2.1 ID: Abx #11 - Invanz >> Merrem D#3 for Pseudomonas peritonitis, afebrile, WBC WNL Best Practices: Lovenox, MC TPN Access: PICC placed 04/27/14 TPN day#: 4 (9/21 >> )  Current Nutrition:  TPN  Nutritional Goals:  2200-2500 kCal, 120-140 grams of protein per day   Plan:   - Continue Clinimix E 5/15 at 100 ml/hr + IVFE at 10 ml/hr.  TPN provides 2184 kCal and 120 gm protein daily, meeting 99% of minimal kCal and 100% of minimal protein needs. - Daily multivitamin and trace elements - Watch CBGs - F/U daily    Perry [name] D. Mina Marble, PharmD, BCPS Pager:  [phone] 05/01/2014, 8:32 AM

## 2014-05-02 LAB — COMPREHENSIVE METABOLIC PANEL
ALBUMIN: 2.5 g/dL — AB (ref 3.5–5.2)
ALT: 12 U/L (ref 0–53)
ANION GAP: 9 (ref 5–15)
AST: 18 U/L (ref 0–37)
Alkaline Phosphatase: 61 U/L (ref 39–117)
BILIRUBIN TOTAL: 0.3 mg/dL (ref 0.3–1.2)
BUN: 17 mg/dL (ref 6–23)
CHLORIDE: 103 meq/L (ref 96–112)
CO2: 25 meq/L (ref 19–32)
Calcium: 9 mg/dL (ref 8.4–10.5)
Creatinine, Ser: 0.66 mg/dL (ref 0.50–1.35)
GFR calc Af Amer: >90 mL/min (ref >90)
Glucose, Bld: 144 mg/dL — ABNORMAL HIGH (ref 70–99)
POTASSIUM: 4.2 meq/L (ref 3.7–5.3)
Sodium: 137 mEq/L (ref 137–147)
Total Protein: 6.2 g/dL (ref 6.0–8.3)

## 2014-05-02 LAB — CBC
HEMATOCRIT: 34.6 % — AB (ref 39.0–52.0)
Hemoglobin: 11.9 g/dL — ABNORMAL LOW (ref 13.0–17.0)
MCH: 31.2 pg (ref 26.0–34.0)
MCHC: 34.4 g/dL (ref 30.0–36.0)
MCV: 90.8 fL (ref 78.0–100.0)
Platelets: 281 10*3/uL (ref 150–400)
RBC: 3.81 MIL/uL — ABNORMAL LOW (ref 4.22–5.81)
RDW: 13.2 % (ref 11.5–15.5)
WBC: 8.8 10*3/uL (ref 4.0–10.5)

## 2014-05-02 MED ORDER — TRACE MINERALS CR-CU-F-FE-I-MN-MO-SE-ZN IV SOLN
INTRAVENOUS | Status: DC
  Filled 2014-05-02: qty 3000

## 2014-05-02 MED ORDER — FAT EMULSION 20 % IV EMUL
250.0000 mL | INTRAVENOUS | Status: DC
  Filled 2014-05-02: qty 250

## 2014-05-02 MED ORDER — FAT EMULSION 20 % IV EMUL
250.0000 mL | INTRAVENOUS | Status: AC
  Administered 2014-05-02: 250 mL via INTRAVENOUS
  Filled 2014-05-02: qty 250

## 2014-05-02 MED ORDER — OXYCODONE-ACETAMINOPHEN 5-325 MG PO TABS
1.0000 | ORAL_TABLET | ORAL | Status: DC | PRN
  Administered 2014-05-02 – 2014-05-03 (×3): 1 via ORAL
  Filled 2014-05-02: qty 1
  Filled 2014-05-02: qty 2
  Filled 2014-05-02: qty 1

## 2014-05-02 MED ORDER — TRACE MINERALS CR-CU-F-FE-I-MN-MO-SE-ZN IV SOLN
INTRAVENOUS | Status: AC
  Administered 2014-05-02: 19:00:00 via INTRAVENOUS
  Filled 2014-05-02: qty 3000

## 2014-05-02 NOTE — Progress Notes (Signed)
Tolerating clears Passing flatus  Can start weaning TNA Possibly advance diet tomorrow.  May start PO cardiac meds.  Imogene Burn. Georgette Dover, MD, [facility] Surgery  General/ Trauma Surgery  05/02/2014 12:09 PM

## 2014-05-02 NOTE — Progress Notes (Signed)
11 Days Post-Op  Subjective: Passed his first flatus while we were talking.  Tolerating some clears, his back is bothering him now.  He thinks it was from walking crooked.  No other complaints.  Objective: Vital signs in last 24 hours: Temp:  [97.7 F (36.5 C)-98.3 F (36.8 C)] 97.7 F (36.5 C) (09/26 0809) Pulse Rate:  [68-104] 76 (09/26 0809) Resp:  [16-20] 16 (09/26 0809) BP: (113-141)/(70-86) 132/80 mmHg (09/26 0809) SpO2:  [96 %-100 %] 100 % (09/26 0809) Last BM Date: 04/30/14 45 from the drain, nothing more recorded. Diet; clears/TNA Afebrile, VSS Labs OK Intake/Output from previous day: 09/25 0701 - 09/26 0700 In: 15  Out: 3345 [Urine:3300; Drains:45] Intake/Output this shift: Total I/O In: 0  Out: 500 [Urine:500]  General appearance: alert, cooperative and no distress Resp: clear to auscultation bilaterally GI: soft large abdomen up in chair + BS, first flatus, ecchymosis from shots.  Drain is clear fluid  Lab Results:   Recent Labs  05/02/14 0458  WBC 8.8  HGB 11.9*  HCT 34.6*  PLT 281    BMET  Recent Labs  05/01/14 0522 05/02/14 0458  NA 143 137  K 4.3 4.2  CL 108 103  CO2 24 25  GLUCOSE 153* 144*  BUN 16 17  CREATININE 0.70 0.66  CALCIUM 8.7 9.0   PT/INR No results found for this basename: LABPROT, INR,  in the last 72 hours   Recent Labs Lab 04/28/14 0342 04/30/14 0343 05/02/14 0458  AST 18 15 18   ALT 12 10 12   ALKPHOS 42 43 61  BILITOT 0.4 0.3 0.3  PROT 5.2* 5.4* 6.2  ALBUMIN 2.1* 2.2* 2.5*     Lipase     Component Value Date/Time   LIPASE 22 04/20/2014 2032     Studies/Results: Ct Abdomen Pelvis W Contrast  05/01/2014   CLINICAL DATA:  Followup drain placement  EXAM: CT ABDOMEN AND PELVIS WITH CONTRAST  TECHNIQUE: Multidetector CT imaging of the abdomen and pelvis was performed using the standard protocol following bolus administration of intravenous contrast.  CONTRAST:  154mL OMNIPAQUE IOHEXOL 300 MG/ML  SOLN   COMPARISON:  04/26/2014  FINDINGS: Lower chest: Small pleural effusions with overlying compressive type changes noted bilaterally.  Hepatobiliary: No focal liver abnormality identified. The gallbladder is on unremarkable. No biliary dilatation.  Spleen: Normal appearance of the spleen. A small amount of perisplenic ascites is noted.  Pancreas: Normal appearance of the pancreas.  Stomach/Bowel: The stomach is normal. The small bowel loops have a normal caliber. Wall thickening involving the terminal ileum is again identified. The appendix is again identified. This remains thickened measuring 1.2 cm. Small appendicoliths are identified within or adjacent to the appendiceal tip. The periappendiceal fluid collection has decreased in size from the previous exam status post drain placement. This measures 5.4 x 2.4 x 5.9 cm. On the previous exam this measured 9.7 x 5.4 x 7.5 cm. The mid and distal colon are on unremarkable.  Adrenals/urinary tract: The adrenal glands are both normal. The left kidney is on unremarkable. 1.6 cm exophytic cyst arises from the upper pole the right kidney. The urinary bladder appears normal.  Vascular/Lymphatic: Calcified atherosclerotic disease involves the normal caliber abdominal aorta. There is no retroperitoneal adenopathy. No mesenteric adenopathy. No pelvic or inguinal adenopathy identified. Small right lower quadrant ileo column lymph nodes are likely reactive.  Reproductive: There is prostate gland enlargement. The seminal vesicles appear symmetric and appearance.  Musculoskeletal: Review of the visualized osseous structures is  significant for mild lumbar degenerative disc disease. No aggressive lytic or sclerotic bone lesions noted.  Other: A small amount of ascites is noted within the abdomen and pelvis.  IMPRESSION: 1. Decrease in volume of right lower quadrant appendiceal abscess status post drain placement. The percutaneous drainage catheter remains well-seated within the fluid  collection. 2. Persistent thickening of the appendix which contains several appendicoliths within the distal lumen or adjacent to the distal tip of the appendix. 3. Atherosclerotic disease. 4. Small amount of ascites.   Electronically Signed   By: Kerby Moors M.D.   On: 05/01/2014 14:16    Medications: . antiseptic oral rinse  7 mL Mouth Rinse q12n4p  . chlorhexidine  15 mL Mouth Rinse BID  . enoxaparin (LOVENOX) injection  40 mg Subcutaneous Q24H  . meropenem (MERREM) IV  1 g Intravenous Q8H  . metoprolol  5 mg Intravenous 4 times per day  . pantoprazole (PROTONIX) IV  40 mg Intravenous QHS    Assessment/Plan Acute perforated appendicitis/Laparoscopic appendectomy Surgeon: Dr. Serita Grammes, 04/21/14.  Post op RLQ abscess with drain placement 04/27/14 by IR. Dr Jacqulynn Cadet  Pseudomonas - changed abx to Meropenem based on Pharmacy recs  Post op ileus  PCM on TNA  Atrial fibrillation  CAD with prior IMI/CABG  Obesity  Dyslipidemia    Plan;  I will continue clears. Continue to mobilize, PO pain med, k pad.  Continue antibiotics.  LOS: 12 days    Perry [name] 05/02/2014

## 2014-05-02 NOTE — Progress Notes (Signed)
Pt noted to be in SR on the monitor. Had 12 lead EKG done confirming rhythm change. Pt stable with change. Dr. Percival Spanish called and notified of conversion and orders received to d/c cardizem gtt.  Will d/c gtt and continue to monitor patient closely.

## 2014-05-02 NOTE — Progress Notes (Signed)
Subjective:  Remains in atrial fibrillation. Currently complaining of significant low back pain. Had CT scan yesterday and plan to leave drain in at this point.  Objective:  Vital Signs in the last 24 hours: BP 132/80  Pulse 76  Temp(Src) 97.7 F (36.5 C) (Oral)  Resp 16  Ht 6\' 1"  (1.854 m)  Wt 113.263 kg (249 lb 11.2 oz)  BMI 32.95 kg/m2  SpO2 100%  Physical Exam: Pleasant WM in NAD Lungs:  Clear  Cardiac:  irregular rhythm, normal S1 and S2, no S3 Abdomen:  Bowel sounds active this am. Extremities:  No edema present  Intake/Output from previous day: 09/25 0701 - 09/26 0700 In: 15  Out: 3345 [Urine:3300; Drains:45] NG tube out now  Weight Filed Weights   04/27/14 1847 04/30/14 1710 05/01/14 0404  Weight: 115.9 kg (255 lb 8.2 oz) 114.3 kg (251 lb 15.8 oz) 113.263 kg (249 lb 11.2 oz)    Lab Results: Basic Metabolic Panel:  Recent Labs  05/01/14 0522 05/02/14 0458  NA 143 137  K 4.3 4.2  CL 108 103  CO2 24 25  GLUCOSE 153* 144*  BUN 16 17  CREATININE 0.70 0.66    CBC:  Recent Labs  05/02/14 0458  WBC 8.8  HGB 11.9*  HCT 34.6*  MCV 90.8  PLT 281    BNP    Component Value Date/Time   PROBNP 64.0 03/21/2009 2355   Telemetry: Atrial fibrillation with controlled response  Assessment/Plan:  1. Atrial fibrillation new onset now rate controlled (since at least 9/15) 2. CAD with prior IMI and CABG emergent - stable 3. Appendicular abcess being drained -await surgery recommendations this to starting anticoagulation.  Rec:  Continue rate control with IV diltiazem and beta blocker.   When able to take pos change back to oral therapy Anticoagulate when cleared by surgery. It appears that he may go home with a drain and it appears that we will have to wait until surgery  says it is okay to anticoagulate him prior to conversion of atrial fibrillation.   Kerry Hough  MD [facility] Cardiology  05/02/2014, 10:04 AM

## 2014-05-02 NOTE — Progress Notes (Deleted)
   Subjective:    Patient ID: Perry [name], male    DOB: [birth year], 68 y.o.   MRN: [id number]  HPI    Review of Systems     Objective:   Physical Exam        Assessment & Plan:

## 2014-05-02 NOTE — Progress Notes (Signed)
Subjective: Pt feels ok, c/o headache and back pain No N/V, but hasn't had much po intake and hasn't yet passed flatus or BM CT scan done yesterday pm  Objective: Physical Exam: BP 132/80  Pulse 76  Temp(Src) 97.7 F (36.5 C) (Oral)  Resp 16  Ht 6\' 1"  (1.854 m)  Wt 249 lb 11.2 oz (113.263 kg)  BMI 32.95 kg/m2  SpO2 100% Abd: mildly distended but soft. Drain intact, site clean. Serous output, 45cc recorded past 24 hrs.   Labs: CBC  Recent Labs  05/02/14 0458  WBC 8.8  HGB 11.9*  HCT 34.6*  PLT 281   BMET  Recent Labs  05/01/14 0522 05/02/14 0458  NA 143 137  K 4.3 4.2  CL 108 103  CO2 24 25  GLUCOSE 153* 144*  BUN 16 17  CREATININE 0.70 0.66  CALCIUM 8.7 9.0   LFT  Recent Labs  05/02/14 0458  PROT 6.2  ALBUMIN 2.5*  AST 18  ALT 12  ALKPHOS 61  BILITOT 0.3   PT/INR No results found for this basename: LABPROT, INR,  in the last 72 hours   Studies/Results: Ct Abdomen Pelvis W Contrast  05/01/2014   CLINICAL DATA:  Followup drain placement  EXAM: CT ABDOMEN AND PELVIS WITH CONTRAST  TECHNIQUE: Multidetector CT imaging of the abdomen and pelvis was performed using the standard protocol following bolus administration of intravenous contrast.  CONTRAST:  169mL OMNIPAQUE IOHEXOL 300 MG/ML  SOLN  COMPARISON:  04/26/2014  FINDINGS: Lower chest: Small pleural effusions with overlying compressive type changes noted bilaterally.  Hepatobiliary: No focal liver abnormality identified. The gallbladder is on unremarkable. No biliary dilatation.  Spleen: Normal appearance of the spleen. A small amount of perisplenic ascites is noted.  Pancreas: Normal appearance of the pancreas.  Stomach/Bowel: The stomach is normal. The small bowel loops have a normal caliber. Wall thickening involving the terminal ileum is again identified. The appendix is again identified. This remains thickened measuring 1.2 cm. Small appendicoliths are identified within or adjacent to the  appendiceal tip. The periappendiceal fluid collection has decreased in size from the previous exam status post drain placement. This measures 5.4 x 2.4 x 5.9 cm. On the previous exam this measured 9.7 x 5.4 x 7.5 cm. The mid and distal colon are on unremarkable.  Adrenals/urinary tract: The adrenal glands are both normal. The left kidney is on unremarkable. 1.6 cm exophytic cyst arises from the upper pole the right kidney. The urinary bladder appears normal.  Vascular/Lymphatic: Calcified atherosclerotic disease involves the normal caliber abdominal aorta. There is no retroperitoneal adenopathy. No mesenteric adenopathy. No pelvic or inguinal adenopathy identified. Small right lower quadrant ileo column lymph nodes are likely reactive.  Reproductive: There is prostate gland enlargement. The seminal vesicles appear symmetric and appearance.  Musculoskeletal: Review of the visualized osseous structures is significant for mild lumbar degenerative disc disease. No aggressive lytic or sclerotic bone lesions noted.  Other: A small amount of ascites is noted within the abdomen and pelvis.  IMPRESSION: 1. Decrease in volume of right lower quadrant appendiceal abscess status post drain placement. The percutaneous drainage catheter remains well-seated within the fluid collection. 2. Persistent thickening of the appendix which contains several appendicoliths within the distal lumen or adjacent to the distal tip of the appendix. 3. Atherosclerotic disease. 4. Small amount of ascites.   Electronically Signed   By: Kerby Moors M.D.   On: 05/01/2014 14:16    Assessment/Plan: RLQ abscess post partial appendectomy CT  shows decrease in size of abscess, residual inflamed appendix with appendicoliths. Terminal ileum also with mild edema but no obstruction.  Recommend drain to remain at this point. Encouraged OOB/ambulation   LOS: 12 days    Ascencion Dike PA-C 05/02/2014 9:08 AM

## 2014-05-02 NOTE — Progress Notes (Addendum)
PARENTERAL NUTRITION CONSULT NOTE - FOLLOW UP  Pharmacy Consult:  TPN Indication:  Prolonged ileus  Allergies  Allergen Reactions  . Xanax [Alprazolam] Shortness Of Breath    Patient Measurements: Height: 6\' 1"  (185.4 cm) Weight: 249 lb 11.2 oz (113.263 kg) IBW/kg (Calculated) : 79.9 AdjBW = 89 kg  Vital Signs: Temp: 98 F (36.7 C) (09/26 0416) Temp src: Oral (09/26 0416) BP: 126/70 mmHg (09/26 0416) Pulse Rate: 76 (09/26 0416) Intake/Output from previous day: 09/25 0701 - 09/26 0700 In: 15  Out: 3345 [Urine:3300; Drains:45]  Labs:  Recent Labs  05/02/14 0458  WBC 8.8  HGB 11.9*  HCT 34.6*  PLT 281     Recent Labs  04/30/14 0343 05/01/14 0522 05/02/14 0458  NA 139 143 137  K 3.9 4.3 4.2  CL 106 108 103  CO2 23 24 25   GLUCOSE 135* 153* 144*  BUN 20 16 17   CREATININE 0.70 0.70 0.66  CALCIUM 8.3* 8.7 9.0  MG 2.0  --   --   PHOS 3.2  --   --   PROT 5.4*  --  6.2  ALBUMIN 2.2*  --  2.5*  AST 15  --  18  ALT 10  --  12  ALKPHOS 43  --  61  BILITOT 0.3  --  0.3   Estimated Creatinine Clearance: 116.6 ml/min (by C-G formula based on Cr of 0.66).    Recent Labs  04/29/14 1916 05/01/14 0746 05/01/14 2008  GLUCAP 120* 171* 144*     Insulin Requirements in the past 24 hours:  CBGs/SSI d/c'ed 9/24  Assessment: 68 YOF admitted 04/21/14 with abdominal pain.  Found to have ruptured appendix and taken to the OR on the same day for appendectomy.  NG tube was placed on 04/23/14 due to abdominal distention.  Abdominal films on 04/24/14 showed ileus.  He also developed RLQ abscess that required the placement of a drain by IR.  Pharmacy consulted to manage TPN for nutritional support.  GI: ileus s/p appendectomy for perf appendicitis. Baseline prealbumin low at 8; TG WNL.  NGT removed.  Ileus slowly resolving per MD, abscess improving on CT - started CLD.  S/p Dulcolax suppository x 1 9/24, +BM Endo: DM with A1c 6.9% - CBGs well controlled and SSI/CBGs d/c'ed,  now with some high normal values Lytes: all WNL Renal: SCr 0.66 (stable) - D51/2NS at 20 ml/hr, good UOP 1.2 ml/kg/hr Neuro: receiving PRN Dilaudid, pain score 0-7 Cards: CAD / HTN - VSS, Afib (CHADS2 = 2) - rate controlled on IV Lopressor, diltiazem gtt - not a candidate for [facility] currently.  Doppler negative for DVT.  Will need TEE. Hepatobil: LFTs WNL, albumin WNL on admit and now low at 2.1 ID: Abx #[id number] - Invanz >> Merrem D#4 for Pseudomonas peritonitis, afebrile, WBC WNL Best Practices: Lovenox, MC TPN Access: PICC placed 04/27/14 TPN day#: 4 (9/21 >> )  Current Nutrition:  TPN + clear liquid diet  Nutritional Goals:  2200-2500 kCal, 120-140 grams of protein per day   Plan:   - Continue Clinimix E 5/15 at 100 ml/hr + IVFE at 10 ml/hr.  TPN provides 2184 kCal and 120 gm protein daily, meeting 99% of minimal kCal and 100% of minimal protein needs. - Daily multivitamin and trace elements - Watch CBGs - F/U PO intake to wean off of TPN    Chariah Bailey D. Mina Marble, PharmD, BCPS Pager:  [phone] 05/02/2014, 7:50 AM   ============================================   Addendum: - Received  order to start weaning TPN since patient's bowel function is returning - Surgeon plans to advance diet tomorrow   Plan: - Decrease Clinimix E 5/15 to 80 ml/hr and continue IVFE at 10 ml/hr - F/U PO intake and diet advancement to hopefully wean the patient off of TPN tomorrow    Sabrinna Yearwood D. Mina Marble, PharmD, BCPS Pager:  [phone] 05/02/2014, 1:19 PM

## 2014-05-02 NOTE — Progress Notes (Signed)
Pt has been hospitalized since 04/20/14 - appendectomy. Will contact him at a later time regarding need for Diabetic Care follow up.

## 2014-05-03 LAB — GLUCOSE, CAPILLARY
GLUCOSE-CAPILLARY: 116 mg/dL — AB (ref 70–99)
Glucose-Capillary: 109 mg/dL — ABNORMAL HIGH (ref 70–99)

## 2014-05-03 MED ORDER — FAT EMULSION 20 % IV EMUL
250.0000 mL | INTRAVENOUS | Status: DC
  Administered 2014-05-03: 250 mL via INTRAVENOUS
  Filled 2014-05-03: qty 250

## 2014-05-03 MED ORDER — METOPROLOL SUCCINATE ER 25 MG PO TB24
25.0000 mg | ORAL_TABLET | Freq: Every day | ORAL | Status: DC
  Administered 2014-05-03 – 2014-05-04 (×2): 25 mg via ORAL
  Filled 2014-05-03 (×2): qty 1

## 2014-05-03 MED ORDER — LIVING WELL WITH DIABETES BOOK
Freq: Once | Status: AC
  Administered 2014-05-03: 1
  Filled 2014-05-03: qty 1

## 2014-05-03 MED ORDER — TRACE MINERALS CR-CU-F-FE-I-MN-MO-SE-ZN IV SOLN
INTRAVENOUS | Status: DC
  Administered 2014-05-03: 17:00:00 via INTRAVENOUS
  Filled 2014-05-03: qty 1000

## 2014-05-03 NOTE — Evaluation (Signed)
Physical Therapy Evaluation Patient Details Name: Perry [name] MRN: [id number] DOB: [birth year] Today's Date: 05/03/2014   History of Present Illness  This 68 y.o. male patient has been having abdominal pain for about 24 hours unassociated with nausea or vomiting. The pain started in a generalized periumbilical manner and subsequently localized to the right lower quadrant. Nothing releived the pain. CT supported the diagnosis of acute appendicitis.  Clinical Impression  Pt was seen with wife present to gather information and answer questions.  Pt is having some stiffness in old back pain due to inactivity of hospital stay, resulting in spasm and likely irritated by acute abd pain.  He is responding to movement and meds, plan is to follow up with outpatient for back when abdomen is healed.  No further therapy needed.    Follow Up Recommendations Outpatient PT;Supervision/Assistance - 24 hour    Equipment Recommendations  None recommended by PT    Recommendations for Other Services Other (comment)     Precautions / Restrictions Precautions Precautions: Other (comment) (abdominal incision with JP drain) Restrictions Weight Bearing Restrictions: No      Mobility  Bed Mobility Overal bed mobility: Modified Independent (having trouble positioning in bed)             General bed mobility comments: Recommended pillows to allow sidelying comfort to hug and separate knees  Transfers Overall transfer level: Modified independent Equipment used: None (pushing IV pole)             General transfer comment: Using UE's to control sitting and standing  Ambulation/Gait Ambulation/Gait assistance: Supervision Ambulation Distance (Feet): 300 Feet Assistive device: None Gait Pattern/deviations: Decreased dorsiflexion - right;Decreased dorsiflexion - left;Step-through pattern;Drifts right/left;Wide base of support Gait velocity: normal Gait velocity interpretation: at or above  normal speed for age/gender General Gait Details: tends to have limited ankle DF and result is a rapid transition from heelstrike to midstance, hard footfalls  Stairs Stairs: Yes Stairs assistance: Supervision Stair Management: No rails;One rail Left Number of Stairs: 20 General stair comments: Can control without rails but recommend pt use them at home  Wheelchair Mobility    Modified Rankin (Stroke Patients Only)       Balance Overall balance assessment: Modified Independent                                           Pertinent Vitals/Pain Pain Assessment: 0-10 Pain Score: 3  Pain Location: R side of back Pain Descriptors / Indicators: Aching Pain Intervention(s): Limited activity within patient's tolerance;Monitored during session;Relaxation;Repositioned BP was 131/86,pulse 78 and O2 sat 100%.    Home Living Family/patient expects to be discharged to:: Private residence Living Arrangements: Spouse/significant other Available Help at Discharge: Family;Friend(s);Available 24 hours/day Type of Home: House Home Access: Stairs to enter Entrance Stairs-Rails: Right Entrance Stairs-Number of Steps: 3 Home Layout: Two level Home Equipment: None      Prior Function Level of Independence: Independent               Hand Dominance   Dominant Hand: Right    Extremity/Trunk Assessment   Upper Extremity Assessment: Overall WFL for tasks assessed           Lower Extremity Assessment: Overall WFL for tasks assessed;RLE deficits/detail;LLE deficits/detail RLE Deficits / Details: ankle stiffness 0-5 DF  LLE Deficits / Details: ankle stiffness 0-5 DF  Cervical / Trunk  Assessment: Other exceptions (flat lumbar spine probably spasm)  Communication   Communication: No difficulties  Cognition Arousal/Alertness: Awake/alert Behavior During Therapy: WFL for tasks assessed/performed Overall Cognitive Status: Within Functional Limits for tasks  assessed                      General Comments General comments (skin integrity, edema, etc.): Pt is able to manage gait without PT but do recommend PT outpatient after surgery heals to restore his back ROM and comfort    Exercises General Exercises - Lower Extremity Ankle Circles/Pumps: AROM;Both;5 reps;Seated Gluteal Sets: AROM;Both;5 reps;Standing (feet on floor to avoid overstressing back and surgery site)      Assessment/Plan    PT Assessment All further PT needs can be met in the next venue of care  PT Diagnosis Difficulty walking   PT Problem List Decreased range of motion;Decreased skin integrity;Obesity  PT Treatment Interventions     PT Goals (Current goals can be found in the Care Plan section) Acute Rehab PT Goals Patient Stated Goal: to get better (from medical complications) PT Goal Formulation: With patient/family Time For Goal Achievement: 05/04/14 Potential to Achieve Goals: Good    Frequency     Barriers to discharge        Co-evaluation               End of Session Equipment Utilized During Treatment:  (none) Activity Tolerance: Patient tolerated treatment well Patient left: in chair;with call bell/phone within reach;with family/visitor present;Other (comment) (pillows to position behind and in front of back) Nurse Communication: Mobility status;Other (comment) (strategies to manage pain)         Time: 3709-6438 PT Time Calculation (min): 33 min   Charges:   PT Evaluation $Initial PT Evaluation Tier I: 1 Procedure PT Treatments $Gait Training: 8-22 mins   PT G Codes:          Ramond Dial May 14, 2014, 1:16 PM Mee Hives, PT MS Acute Rehab Dept. Number: [phone]

## 2014-05-03 NOTE — Progress Notes (Signed)
PARENTERAL NUTRITION CONSULT NOTE - FOLLOW UP  Pharmacy Consult:  TPN Indication:  Prolonged ileus  Allergies  Allergen Reactions  . Xanax [Alprazolam] Shortness Of Breath    Patient Measurements: Height: 6\' 1"  (185.4 cm) Weight: 249 lb 11.2 oz (113.263 kg) IBW/kg (Calculated) : 79.9 AdjBW = 89 kg  Vital Signs: Temp: 98.4 F (36.9 C) (09/27 0422) Temp src: Oral (09/27 0422) BP: 126/67 mmHg (09/27 0422) Pulse Rate: 80 (09/27 0422) Intake/Output from previous day: 09/26 0701 - 09/27 0700 In: 150 [P.O.:150] Out: 3250 [Urine:3200; Drains:50]  Labs:  Recent Labs  05/02/14 0458  WBC 8.8  HGB 11.9*  HCT 34.6*  PLT 281     Recent Labs  05/01/14 0522 05/02/14 0458  NA 143 137  K 4.3 4.2  CL 108 103  CO2 24 25  GLUCOSE 153* 144*  BUN 16 17  CREATININE 0.70 0.66  CALCIUM 8.7 9.0  PROT  --  6.2  ALBUMIN  --  2.5*  AST  --  18  ALT  --  12  ALKPHOS  --  61  BILITOT  --  0.3   Estimated Creatinine Clearance: 116.6 ml/min (by C-G formula based on Cr of 0.66).    Recent Labs  05/01/14 0746 05/01/14 2008  GLUCAP 171* 144*     Insulin Requirements in the past 24 hours:  CBGs/SSI d/c'ed 9/24  Assessment: Perry [name] admitted 04/21/14 with abdominal pain.  Found to have ruptured appendix and taken to the OR on the same day for appendectomy.  NG tube was placed on 04/23/14 due to abdominal distention.  Abdominal films on 04/24/14 showed ileus.  He also developed RLQ abscess that required the placement of a drain by IR.  Pharmacy consulted to manage TPN for nutritional support.  GI: ileus s/p appendectomy for perf appendicitis. Baseline prealbumin low at 8; TG WNL.  NGT removed.  Ileus slowly resolving per MD, abscess improving on CT - tolerated CLD, advanced to soft diet today.  S/p Dulcolax suppository x 1 9/24, +BM Endo: DM with A1c 6.9% - CBGs well controlled and SSI/CBGs d/c'ed, now with some high normal values Lytes: all WNL Renal: SCr 0.66 (stable) - D51/2NS at  20 ml/hr, good UOP 1.2 ml/kg/hr Neuro: receiving PRN Dilaudid, pain score 0-7 Cards: CAD / HTN - VSS, Afib (CHADS2 = 2) - rate controlled, Lopressor IV >> PO, off diltiazem gtt - may start AC as outpatient.  Doppler negative for DVT.  Will need TEE. Hepatobil: LFTs WNL, albumin WNL on admit and now low at 2.1 ID: Abx #13 - Invanz >> Merrem D#5 for Pseudomonas peritonitis, afebrile, WBC WNL Best Practices: Lovenox, MC TPN Access: PICC placed 04/27/14 TPN day#: 5 (9/21 >> )  Current Nutrition:  TPN + clear liquid diet  Nutritional Goals:  2200-2500 kCal, 120-140 grams of protein per day   Plan:   - Decrease Clinimix E 5/15 to 40 ml/hr and IVFE to 5 ml/hr.  TPN will stop tomorrow post tonight's bag per Surgeon's order. - Daily multivitamin and trace elements - D/C TPN labs and standing order tomorrow   Alis Sawchuk D. Mina Marble, PharmD, BCPS Pager:  [phone] 05/03/2014, 9:43 AM

## 2014-05-03 NOTE — Progress Notes (Signed)
Subjective:  Converted to normal sinus rhythm last night. He is still not aware that he is in rhythm or out of rhythm. No change in how he feels. His back pain is much better today. Is starting to have some bowel movement now.  Objective:  Vital Signs in the last 24 hours: BP 126/67  Pulse 80  Temp(Src) 98.4 F (36.9 C) (Oral)  Resp 15  Ht 6\' 1"  (1.854 m)  Wt 113.263 kg (249 lb 11.2 oz)  BMI 32.95 kg/m2  SpO2 96%  Physical Exam: Pleasant WM in NAD Lungs:  Clear  Cardiac: Regular rhythm, normal S1 and S2, no S3 Abdomen:  Bowel sounds active this am. Extremities:  No edema present  Intake/Output from previous day: 09/26 0701 - 09/27 0700 In: 150 [P.O.:150] Out: 3250 [Urine:3200; Drains:50] NG tube out now  Weight Filed Weights   04/27/14 1847 04/30/14 1710 05/01/14 0404  Weight: 115.9 kg (255 lb 8.2 oz) 114.3 kg (251 lb 15.8 oz) 113.263 kg (249 lb 11.2 oz)    Lab Results: Basic Metabolic Panel:  Recent Labs  05/01/14 0522 05/02/14 0458  NA 143 137  K 4.3 4.2  CL 108 103  CO2 24 25  GLUCOSE 153* 144*  BUN 16 17  CREATININE 0.70 0.66    CBC:  Recent Labs  05/02/14 0458  WBC 8.8  HGB 11.9*  HCT 34.6*  MCV 90.8  PLT 281    BNP    Component Value Date/Time   PROBNP 64.0 03/21/2009 2355   Telemetry: Atrial fibrillation with controlled response  Assessment/Plan:  1. Atrial fibrillation new onset with surgery-currently back in sinus rhythm  2. CAD with prior IMI and CABG emergent - stable 3. Appendicular abcess being drained -await surgery recommendations for starting anticoagulation.  Rec:  Discontinue intravenous diltiazem. Now that he is taking oral liquids, begin metoprolol Recommendations per surgery for management of abscess. Can followup as an outpatient to consider starting anticoagulation.   Kerry Hough  MD [facility]  05/03/2014, 9:15 AM

## 2014-05-03 NOTE — Progress Notes (Signed)
12 Days Post-Op  Subjective: PT doing well.  Tol CLD.  Ambulating, + BMs/flatus  Objective: Vital signs in last 24 hours: Temp:  [97.7 F (36.5 C)-99.1 F (37.3 C)] 98.4 F (36.9 C) (09/27 0422) Pulse Rate:  [75-92] 80 (09/27 0422) Resp:  [15-18] 15 (09/27 0422) BP: (101-130)/(58-88) 126/67 mmHg (09/27 0422) SpO2:  [96 %-100 %] 96 % (09/27 0422) Last BM Date: 05/02/14  Intake/Output from previous day: 09/26 0701 - 09/27 0700 In: 150 [P.O.:150] Out: 3250 [Urine:3200; Drains:50] Intake/Output this shift:    General appearance: alert and cooperative Cardio: regular rate and rhythm, S1, S2 normal, no murmur, click, rub or gallop GI: soft, non-tender; bowel sounds normal; no masses,  no organomegaly  Lab Results:   Recent Labs  05/02/14 0458  WBC 8.8  HGB 11.9*  HCT 34.6*  PLT 281   BMET  Recent Labs  05/01/14 0522 05/02/14 0458  NA 143 137  K 4.3 4.2  CL 108 103  CO2 24 25  GLUCOSE 153* 144*  BUN 16 17  CREATININE 0.70 0.66  CALCIUM 8.7 9.0   PT/INR No results found for this basename: LABPROT, INR,  in the last 72 hours ABG No results found for this basename: PHART, PCO2, PO2, HCO3,  in the last 72 hours  Studies/Results: Ct Abdomen Pelvis W Contrast  05/01/2014   CLINICAL DATA:  Followup drain placement  EXAM: CT ABDOMEN AND PELVIS WITH CONTRAST  TECHNIQUE: Multidetector CT imaging of the abdomen and pelvis was performed using the standard protocol following bolus administration of intravenous contrast.  CONTRAST:  158mL OMNIPAQUE IOHEXOL 300 MG/ML  SOLN  COMPARISON:  04/26/2014  FINDINGS: Lower chest: Small pleural effusions with overlying compressive type changes noted bilaterally.  Hepatobiliary: No focal liver abnormality identified. The gallbladder is on unremarkable. No biliary dilatation.  Spleen: Normal appearance of the spleen. A small amount of perisplenic ascites is noted.  Pancreas: Normal appearance of the pancreas.  Stomach/Bowel: The stomach is  normal. The small bowel loops have a normal caliber. Wall thickening involving the terminal ileum is again identified. The appendix is again identified. This remains thickened measuring 1.2 cm. Small appendicoliths are identified within or adjacent to the appendiceal tip. The periappendiceal fluid collection has decreased in size from the previous exam status post drain placement. This measures 5.4 x 2.4 x 5.9 cm. On the previous exam this measured 9.7 x 5.4 x 7.5 cm. The mid and distal colon are on unremarkable.  Adrenals/urinary tract: The adrenal glands are both normal. The left kidney is on unremarkable. 1.6 cm exophytic cyst arises from the upper pole the right kidney. The urinary bladder appears normal.  Vascular/Lymphatic: Calcified atherosclerotic disease involves the normal caliber abdominal aorta. There is no retroperitoneal adenopathy. No mesenteric adenopathy. No pelvic or inguinal adenopathy identified. Small right lower quadrant ileo column lymph nodes are likely reactive.  Reproductive: There is prostate gland enlargement. The seminal vesicles appear symmetric and appearance.  Musculoskeletal: Review of the visualized osseous structures is significant for mild lumbar degenerative disc disease. No aggressive lytic or sclerotic bone lesions noted.  Other: A small amount of ascites is noted within the abdomen and pelvis.  IMPRESSION: 1. Decrease in volume of right lower quadrant appendiceal abscess status post drain placement. The percutaneous drainage catheter remains well-seated within the fluid collection. 2. Persistent thickening of the appendix which contains several appendicoliths within the distal lumen or adjacent to the distal tip of the appendix. 3. Atherosclerotic disease. 4. Small amount of  ascites.   Electronically Signed   By: Kerby Moors M.D.   On: 05/01/2014 14:16    Anti-infectives: Anti-infectives   Start     Dose/Rate Route Frequency Ordered Stop   04/29/14 1600  meropenem  (MERREM) 1 g in sodium chloride 0.9 % 100 mL IVPB     1 g 200 mL/hr over 30 Minutes Intravenous Every 8 hours 04/29/14 1410     04/26/14 2200  ertapenem (INVANZ) 1 g in sodium chloride 0.9 % 50 mL IVPB  Status:  Discontinued     1 g 100 mL/hr over 30 Minutes Intravenous Every 24 hours 04/26/14 2135 04/29/14 1410   04/22/14 0300  cefTRIAXone (ROCEPHIN) 1 g in dextrose 5 % 50 mL IVPB  Status:  Discontinued     1 g 100 mL/hr over 30 Minutes Intravenous Every 24 hours 04/21/14 0514 04/26/14 2135   04/21/14 1200  metroNIDAZOLE (FLAGYL) IVPB 500 mg  Status:  Discontinued     500 mg 100 mL/hr over 60 Minutes Intravenous Every 8 hours 04/21/14 0514 04/27/14 1058   04/21/14 0130  cefTRIAXone (ROCEPHIN) 1 g in dextrose 5 % 50 mL IVPB     1 g 100 mL/hr over 30 Minutes Intravenous  Once 04/21/14 0120 04/21/14 0347   04/21/14 0130  metroNIDAZOLE (FLAGYL) IVPB 500 mg     500 mg 100 mL/hr over 60 Minutes Intravenous  Once 04/21/14 0120 04/21/14 0509      Assessment/Plan: s/p Procedure(s): APPENDECTOMY LAPAROSCOPIC (N/A) Adv PO Wean TNA PO Rxs Home in next 1-2 days   LOS: 13 days    Rosario Jacks., Anne Hahn 05/03/2014

## 2014-05-03 NOTE — Plan of Care (Signed)
Problem: Phase I Progression Outcomes Goal: Initial discharge plan identified Required percocet only once this shift to manage pain. Up in hallways ambulating

## 2014-05-04 ENCOUNTER — Encounter (HOSPITAL_COMMUNITY): Payer: Self-pay | Admitting: General Surgery

## 2014-05-04 DIAGNOSIS — E785 Hyperlipidemia, unspecified: Secondary | ICD-10-CM

## 2014-05-04 DIAGNOSIS — I25810 Atherosclerosis of coronary artery bypass graft(s) without angina pectoris: Secondary | ICD-10-CM

## 2014-05-04 DIAGNOSIS — K9189 Other postprocedural complications and disorders of digestive system: Secondary | ICD-10-CM

## 2014-05-04 DIAGNOSIS — K651 Peritoneal abscess: Secondary | ICD-10-CM

## 2014-05-04 DIAGNOSIS — K567 Ileus, unspecified: Secondary | ICD-10-CM

## 2014-05-04 DIAGNOSIS — K3533 Acute appendicitis with perforation and localized peritonitis, with abscess: Secondary | ICD-10-CM

## 2014-05-04 HISTORY — DX: Peritoneal abscess: K65.1

## 2014-05-04 HISTORY — DX: Hyperlipidemia, unspecified: E78.5

## 2014-05-04 HISTORY — DX: Acute appendicitis with perforation, localized peritonitis, and gangrene, with abscess: K35.33

## 2014-05-04 HISTORY — DX: Atherosclerosis of coronary artery bypass graft(s) without angina pectoris: I25.810

## 2014-05-04 HISTORY — DX: Ileus, unspecified: K56.7

## 2014-05-04 LAB — CBC
HCT: 34.4 % — ABNORMAL LOW (ref 39.0–52.0)
Hemoglobin: 11.7 g/dL — ABNORMAL LOW (ref 13.0–17.0)
MCH: 30.7 pg (ref 26.0–34.0)
MCHC: 34 g/dL (ref 30.0–36.0)
MCV: 90.3 fL (ref 78.0–100.0)
PLATELETS: 300 10*3/uL (ref 150–400)
RBC: 3.81 MIL/uL — ABNORMAL LOW (ref 4.22–5.81)
RDW: 13.1 % (ref 11.5–15.5)
WBC: 6.3 10*3/uL (ref 4.0–10.5)

## 2014-05-04 LAB — COMPREHENSIVE METABOLIC PANEL
ALBUMIN: 2.6 g/dL — AB (ref 3.5–5.2)
ALK PHOS: 86 U/L (ref 39–117)
ALT: 16 U/L (ref 0–53)
ANION GAP: 9 (ref 5–15)
AST: 23 U/L (ref 0–37)
BUN: 18 mg/dL (ref 6–23)
CALCIUM: 9.5 mg/dL (ref 8.4–10.5)
CO2: 25 mEq/L (ref 19–32)
Chloride: 105 mEq/L (ref 96–112)
Creatinine, Ser: 0.69 mg/dL (ref 0.50–1.35)
GFR calc non Af Amer: >90 mL/min (ref >90)
Glucose, Bld: 123 mg/dL — ABNORMAL HIGH (ref 70–99)
Potassium: 4.3 mEq/L (ref 3.7–5.3)
Sodium: 139 mEq/L (ref 137–147)
TOTAL PROTEIN: 6.4 g/dL (ref 6.0–8.3)
Total Bilirubin: 0.4 mg/dL (ref 0.3–1.2)

## 2014-05-04 LAB — DIFFERENTIAL
BASOS ABS: 0 10*3/uL (ref 0.0–0.1)
Basophils Relative: 0 % (ref 0–1)
EOS PCT: 2 % (ref 0–5)
Eosinophils Absolute: 0.1 10*3/uL (ref 0.0–0.7)
LYMPHS PCT: 21 % (ref 12–46)
Lymphs Abs: 1.3 10*3/uL (ref 0.7–4.0)
Monocytes Absolute: 0.7 10*3/uL (ref 0.1–1.0)
Monocytes Relative: 10 % (ref 3–12)
NEUTROS PCT: 67 % (ref 43–77)
Neutro Abs: 4.2 10*3/uL (ref 1.7–7.7)

## 2014-05-04 LAB — GLUCOSE, CAPILLARY
GLUCOSE-CAPILLARY: 130 mg/dL — AB (ref 70–99)
GLUCOSE-CAPILLARY: 145 mg/dL — AB (ref 70–99)
GLUCOSE-CAPILLARY: 130 mg/dL — AB (ref 70–99)
GLUCOSE-CAPILLARY: 149 mg/dL — AB (ref 70–99)
GLUCOSE-CAPILLARY: 140 mg/dL — AB (ref 70–99)
Glucose-Capillary: 108 mg/dL — ABNORMAL HIGH (ref 70–99)
Glucose-Capillary: 127 mg/dL — ABNORMAL HIGH (ref 70–99)
Glucose-Capillary: 133 mg/dL — ABNORMAL HIGH (ref 70–99)
Glucose-Capillary: 134 mg/dL — ABNORMAL HIGH (ref 70–99)
Glucose-Capillary: 139 mg/dL — ABNORMAL HIGH (ref 70–99)
Glucose-Capillary: 144 mg/dL — ABNORMAL HIGH (ref 70–99)
Glucose-Capillary: 158 mg/dL — ABNORMAL HIGH (ref 70–99)
Glucose-Capillary: 128 mg/dL — ABNORMAL HIGH (ref 70–99)

## 2014-05-04 LAB — TRIGLYCERIDES: TRIGLYCERIDES: 116 mg/dL (ref <150)

## 2014-05-04 LAB — MAGNESIUM: MAGNESIUM: 2.1 mg/dL (ref 1.5–2.5)

## 2014-05-04 LAB — PHOSPHORUS: PHOSPHORUS: 3.8 mg/dL (ref 2.3–4.6)

## 2014-05-04 LAB — PREALBUMIN: PREALBUMIN: 16.7 mg/dL — AB (ref 17.0–34.0)

## 2014-05-04 MED ORDER — METRONIDAZOLE 500 MG PO TABS
500.0000 mg | ORAL_TABLET | Freq: Three times a day (TID) | ORAL | Status: DC
  Administered 2014-05-04: 500 mg via ORAL
  Filled 2014-05-04 (×3): qty 1

## 2014-05-04 MED ORDER — METRONIDAZOLE 500 MG PO TABS: 500.0000 mg | ORAL_TABLET | Freq: Three times a day (TID) | ORAL | Status: DC

## 2014-05-04 MED ORDER — LISINOPRIL 40 MG PO TABS: ORAL_TABLET | ORAL | Status: DC

## 2014-05-04 MED ORDER — OXYCODONE-ACETAMINOPHEN 5-325 MG PO TABS: ORAL_TABLET | ORAL | Status: DC

## 2014-05-04 MED ORDER — CIPROFLOXACIN HCL 500 MG PO TABS
500.0000 mg | ORAL_TABLET | Freq: Two times a day (BID) | ORAL | Status: DC
  Administered 2014-05-04: 500 mg via ORAL
  Filled 2014-05-04 (×3): qty 1

## 2014-05-04 MED ORDER — ACETAMINOPHEN 325 MG PO TABS: 650.0000 mg | ORAL_TABLET | ORAL | Status: DC | PRN

## 2014-05-04 MED ORDER — CIPROFLOXACIN HCL 500 MG PO TABS: 500.0000 mg | ORAL_TABLET | Freq: Two times a day (BID) | ORAL | Status: DC

## 2014-05-04 NOTE — Progress Notes (Signed)
13 Days Post-Op  Subjective: He looks great, drain had 50 out yesterday.  Discussed with Dr. Grandville Silos, patient, and his wife.  Objective: Vital signs in last 24 hours: Temp:  [97.8 F (36.6 C)-98.9 F (37.2 C)] 98.9 F (37.2 C) (09/28 0806) Pulse Rate:  [63-83] 71 (09/28 0806) Resp:  [16-18] 18 (09/28 0806) BP: (129-141)/(76-86) 133/85 mmHg (09/28 0806) SpO2:  [98 %-100 %] 99 % (09/28 0806) Last BM Date: 05/03/14  Intake/Output from previous day: 09/27 0701 - 09/28 0700 In: 285 [P.O.:180; IV Piggyback:100] Out: 2260.5 [Urine:2250; Drains:10.5] Intake/Output this shift: Total I/O In: 120 [P.O.:120] Out: -   General appearance: alert, cooperative and no distress GI: soft, non-tender; bowel sounds normal; no masses,  no organomegaly Drain site OK Lab Results:   Recent Labs  05/02/14 0458 05/04/14 0500  WBC 8.8 6.3  HGB 11.9* 11.7*  HCT 34.6* 34.4*  PLT 281 300    BMET  Recent Labs  05/02/14 0458 05/04/14 0500  NA 137 139  K 4.2 4.3  CL 103 105  CO2 25 25  GLUCOSE 144* 123*  BUN 17 18  CREATININE 0.66 0.69  CALCIUM 9.0 9.5   PT/INR No results found for this basename: LABPROT, INR,  in the last 72 hours   Recent Labs Lab 04/28/14 0342 04/30/14 0343 05/02/14 0458 05/04/14 0500  AST 18 15 18 23   ALT 12 10 12 16   ALKPHOS 42 43 61 86  BILITOT 0.4 0.3 0.3 0.4  PROT 5.2* 5.4* 6.2 6.4  ALBUMIN 2.1* 2.2* 2.5* 2.6*     Lipase     Component Value Date/Time   LIPASE 22 04/20/2014 2032     Studies/Results: No results found.  Medications: . antiseptic oral rinse  7 mL Mouth Rinse q12n4p  . chlorhexidine  15 mL Mouth Rinse BID  . enoxaparin (LOVENOX) injection  40 mg Subcutaneous Q24H  . meropenem (MERREM) IV  1 g Intravenous Q8H  . metoprolol succinate  25 mg Oral Daily   Prior to Admission medications   Medication Sig Start Date End Date Taking? Authorizing Provider  aspirin 81 MG chewable tablet Chew 81 mg by mouth daily.   Yes Historical  Provider, MD  bismuth subsalicylate (PEPTO BISMOL) 262 MG chewable tablet Chew 524 mg by mouth as needed for indigestion.   Yes Historical Provider, MD  fenofibrate (TRICOR) 145 MG tablet Take 145 mg by mouth daily.     Yes Historical Provider, MD  fish oil-omega-3 fatty acids 1000 MG capsule Take 2 g by mouth daily.     Yes Historical Provider, MD  lisinopril (PRINIVIL,ZESTRIL) 40 MG tablet Take 40 mg by mouth daily.     Yes Historical Provider, MD  metoprolol (TOPROL-XL) 50 MG 24 hr tablet Take 25 mg by mouth daily.    Yes Historical Provider, MD  rosuvastatin (CRESTOR) 5 MG tablet Take 5 mg by mouth daily.     Yes Historical Provider, MD     Assessment/Plan Acute perforated appendicitis/Laparoscopic appendectomy Surgeon: Dr. Serita Grammes, 04/21/14.  Post op RLQ abscess with drain placement 04/27/14 by IR. Dr Jacqulynn Cadet  Pseudomonas - changed abx to Meropenem based on Pharmacy recs  Post op ileus  PCM on TNA  Atrial fibrillation  CAD with prior IMI/CABG  Obesity  Dyslipidemia    Plan:  Home on Flagyl and Cipro.  D/c TNA, Follow up with DR. Wakefield Friday 05/08/14. I will get things set up with our office.     LOS: 14 days  Rochel Privett 05/04/2014

## 2014-05-04 NOTE — Progress Notes (Signed)
NUTRITION FOLLOW UP  Intervention:    Recommend Ensure Complete po BID at home, each supplement provides 350 kcal and 13 grams of protein.  Nutrition Dx:   Inadequate oral intake related to altered GI function as evidenced by 50% meal completion, ongoing.  Goal:   Intake to meet >90% of estimated nutrition needs. Met.  Monitor:   PO intake, labs, weight trend.  Assessment:   Patient admitted on 9/14 with abdominal pain, CT supported diagnosis of acute appendicitis. S/P laparoscopic appendectomy on 9/15. JP drain placed on 9/21 for RLQ abscess.  Patient has been receiving TPN since 9/21. Being discontinued today after current bag has infused. Diet has been advanced to soft with thin liquids. PO intake is improving, consumed 50% of breakfast today. Patient reports that he is being discharged home today. Encouraged patient to take supplements (Ensure) if unable to eat well at home, patient agreed.  Height: Ht Readings from Last 1 Encounters:  04/27/14 _0  (1.854 m)    Weight Status:   Wt Readings from Last 1 Encounters:  05/01/14 249 lb 11.2 oz (113.263 kg)  04/27/14  255 lb 8.2 oz (115.9 kg)    Re-estimated needs:  Kcal: 2200-2500 Protein: 120-140 gm Fluid: 2.2-2.5 L  Skin: closed abdominal surgical incision  Diet Order: Criss Rosales   Intake/Output Summary (Last 24 hours) at 05/04/14 1020 Last data filed at 05/04/14 0829  Gross per 24 hour  Intake    400 ml  Output 2260.5 ml  Net -1860.5 ml    Last BM: 9/27   Labs:   Recent Labs Lab 04/28/14 0342  04/30/14 0343 05/01/14 0522 05/02/14 0458 05/04/14 0500  NA 140  < > 139 143 137 139  K 3.6*  < > 3.9 4.3 4.2 4.3  CL 107  < > 106 108 103 105  CO2 25  < > _1 BUN 26*  < > _2 CREATININE 0.73  < > 0.70 0.70 0.66 0.69  CALCIUM 8.5  < > 8.3* 8.7 9.0 9.5  MG 1.9  --  2.0  --   --  2.1  PHOS 2.9  --  3.2  --   --  3.8  GLUCOSE 136*  < > 135* 153* 144* 123*  < > = values in this interval not  displayed.  CBG (last 3)   Recent Labs  05/03/14 1658 05/03/14 2004 05/04/14 0804  GLUCAP 116* 109* 108*    Scheduled Meds: . antiseptic oral rinse  7 mL Mouth Rinse q12n4p  . chlorhexidine  15 mL Mouth Rinse BID  . ciprofloxacin  500 mg Oral BID  . enoxaparin (LOVENOX) injection  40 mg Subcutaneous Q24H  . meropenem (MERREM) IV  1 g Intravenous Q8H  . metoprolol succinate  25 mg Oral Daily  . metroNIDAZOLE  500 mg Oral 3 times per day    Continuous Infusions: . dextrose 5 % and 0.45% NaCl 1,000 mL infusion 20 mL/hr at 04/29/14 0845    Molli Barrows, RD, LDN, [place] Pager [phone] After Hours Pager [phone]

## 2014-05-04 NOTE — Discharge Summary (Signed)
Physician Discharge Summary  Patient ID: Perry [name] MRN: [id number] DOB/AGE: [birth year] 68 y.o.  Admit date: 04/20/2014 Discharge date: 05/04/2014  Admission Diagnoses:  Acute appendicitis Atrial fibrillation  CAD with prior IMI/CABG  Obesity  Dyslipidemia   PCP:  Jenny Reichmann, MD  Discharge Diagnoses:  Active Problems:   Appendicitis   Atrial fibrillation   PROCEDURES:  1.  Laparoscopic appendectomy,04/21/14, Dr. Rolm Bookbinder 2.  Placement of 74F drain into RLQ fluid collection. Marland Kitchen 04/27/2014 IR Dr. Jacqulynn Cadet.    Hospital Course:  The patient has been having abdominal pain for about 24 hours unassociated with nausea or vomiting. The pain started in a generalized periumbilical manner and subsequently localized to the right lower quadrant. Nothing releived the pain. CT supported the diagnosis of acute appendicitis. He was taken to the OR later that morning.  He was found to have a necrotic appendix. Post op he made slow progress with ongoing ileus, WBC started trending up. A repeat CT scan was ordered on 04/26/14. This showed a RLQ abscess with oral contrast and non dependant air.  With concern that that there was an appendiceal leak. There was also appeared to be 6 cm of retained appendix along the cecum. He was sent to IR and drain was placed.  Aspiration of 175 mL thin, brown foul smelling fluid was obtained and sent for culture. The following day there was some concern he might require reoperation at that point.  During the IR drain placement he went into AF with RVR, this is new and Dr. Wynonia Lawman from Cardiology was consulted.  He was placed in SDU.  A picc line was placed and he was started on TNA. He was medically managed by Dr. Wynonia Lawman.  His pain was improved the day after drain placement. His antibiotics were changed after 5 days on Cefipime and Flagyl to Invanz.   Fluid from the drain site slowly improved and went from brown foul smelling to a clear serous fluid. His  pain and discomfort improved daily.  He maintained an ileus up until 04/29/14. He converted to SR on 05/02/14. His diet was advanced and by 05/04/14 he was tolerating a regular diet.  His drainage was all straw colored fluid and his pain was mostly related to the drain itself.  WBC is normal and he is ready for discharge.  He will go home with the drain and he will follow up with Dr. Donne Hazel on 05/08/14. His wounds/port sites are healing nicely.  Wife and patient taught drain care and we have ask for home health to assist with his care at home.  I sent him home on 10 days of Cipro/Flagyl with one refill, organisms from culture were sensitive to this.  Condition on d/c:  Imporved    Disposition: 01-Home or Self Care     Medication List         acetaminophen 325 MG tablet  Commonly known as:  TYLENOL  Take 2 tablets (650 mg total) by mouth every 4 (four) hours as needed for fever (temp 101).     aspirin 81 MG chewable tablet  Chew 81 mg by mouth daily.     bismuth subsalicylate 998 MG chewable tablet  Commonly known as:  PEPTO BISMOL  Chew 524 mg by mouth as needed for indigestion.     ciprofloxacin 500 MG tablet  Commonly known as:  CIPRO  Take 1 tablet (500 mg total) by mouth 2 (two) times daily.     fenofibrate 145  MG tablet  Commonly known as:  TRICOR  Take 145 mg by mouth daily.     fish oil-omega-3 fatty acids 1000 MG capsule  Take 2 g by mouth daily.     lisinopril 40 MG tablet  Commonly known as:  PRINIVIL,ZESTRIL  Take 1/2 tablet for now and follow up with your primary care doctor     metoprolol succinate 50 MG 24 hr tablet  Commonly known as:  TOPROL-XL  Take 25 mg by mouth daily.     metroNIDAZOLE 500 MG tablet  Commonly known as:  FLAGYL  Take 1 tablet (500 mg total) by mouth every 8 (eight) hours.     oxyCODONE-acetaminophen 5-325 MG per tablet  Commonly known as:  PERCOCET/ROXICET  You can take 1-2 every 4 hours.  This has acetaminophen in it.(Tylenol)  You  cannot take more than 4000 mg of acetaminophen per day, and you have to include this in your total.     rosuvastatin 5 MG tablet  Commonly known as:  CRESTOR  Take 5 mg by mouth daily.           Follow-up Information   Follow up with [facility], [place]. Schedule an appointment as soon as possible for a visit on 05/08/2014. (Your appointment is at 2:00 PM, be there at least 20 minutes early for check in.)    Specialty:  General Surgery   Contact information:   [place] [address] [phone]       Follow up with [facility]. (HH-RN for drain care)    Contact information:   [address] [place] [address] [phone]       Signed: Earnstine Regal 05/04/2014, 12:44 PM

## 2014-05-04 NOTE — Discharge Instructions (Signed)
Flush drain with 5cc of saline twice a day. Change dressing everyday or as needed with gauze and tape around the edges.   [facility] ______[facility] LAPAROSCOPIC SURGERY: POST OP INSTRUCTIONS Always review your discharge instruction sheet given to you by the facility where your surgery was performed. IF YOU HAVE DISABILITY OR FAMILY LEAVE FORMS, YOU MUST BRING THEM TO THE OFFICE FOR PROCESSING.   DO NOT GIVE THEM TO YOUR DOCTOR.  1. A prescription for pain medication may be given to you upon discharge.  Take your pain medication as prescribed, if needed.  If narcotic pain medicine is not needed, then you may take acetaminophen (Tylenol) or ibuprofen (Advil) as needed. 2. Take your usually prescribed medications unless otherwise directed. 3. If you need a refill on your pain medication, please contact your pharmacy.  They will contact our office to request authorization. Prescriptions will not be filled after 5pm or on week-ends. 4. You should follow a light diet the first few days after arrival home, such as soup and crackers, etc.  Be sure to include lots of fluids daily. 5. Most patients will experience some swelling and bruising in the area of the incisions.  Ice packs will help.  Swelling and bruising can take several days to resolve.  6. It is common to experience some constipation if taking pain medication after surgery.  Increasing fluid intake and taking a stool softener (such as Colace) will usually help or prevent this problem from occurring.  A mild laxative (Milk of Magnesia or Miralax) should be taken according to package instructions if there are no bowel movements after 48 hours. 7. Unless discharge instructions indicate otherwise, you may remove your bandages 24-48 hours after surgery, and you may shower at that time.  You may have steri-strips (small skin tapes) in place directly over the incision.  These strips should be left on the skin for 7-10 days.  If your  surgeon used skin glue on the incision, you may shower in 24 hours.  The glue will flake off over the next 2-3 weeks.  Any sutures or staples will be removed at the office during your follow-up visit. 8. ACTIVITIES:  You may resume regular (light) daily activities beginning the next day--such as daily self-care, walking, climbing stairs--gradually increasing activities as tolerated.  You may have sexual intercourse when it is comfortable.  Refrain from any heavy lifting or straining until approved by your doctor. a. You may drive when you are no longer taking prescription pain medication, you can comfortably wear a seatbelt, and you can safely maneuver your car and apply brakes. b. RETURN TO WORK:  __________________________________________________________ 9. You should see your doctor in the office for a follow-up appointment approximately 2-3 weeks after your surgery.  Make sure that you call for this appointment within a day or two after you arrive home to insure a convenient appointment time. 10. OTHER INSTRUCTIONS: __________________________________________________________________________________________________________________________ __________________________________________________________________________________________________________________________ WHEN TO CALL YOUR DOCTOR: 1. Fever over 101.0 2. Inability to urinate 3. Continued bleeding from incision. 4. Increased pain, redness, or drainage from the incision. 5. Increasing abdominal pain  The clinic staff is available to answer your questions during regular business hours.  Please dont hesitate to call and ask to speak to one of the nurses for clinical concerns.  If you have a medical emergency, go to the nearest emergency room or call 911.  A surgeon from [facility] is always on call at the hospital. [address], [employer]  [address], [place], [place]  [address] ? [address] [place], [place], [place]   [address] [phone] ?  [phone] ? FAX [phone] Web site: [email or web address] Percutaneous Abscess Drain, Care After  Refer to this sheet in the next few weeks. These instructions provide you with information on caring for yourself after your procedure. Your health care provider may also give you more specific instructions. Your treatment has been planned according to current medical practices, but problems sometimes occur. Call your health care provider if you have any problems or questions after your procedure. WHAT TO EXPECT AFTER THE PROCEDURE After your procedure, it is typical to have the following:   A small amount of discomfort in the area where the drainage tube was placed.  A small amount of bruising around the area where the drainage tube was placed.  Sleepiness and fatigue for the rest of the day from the medicines used. HOME CARE INSTRUCTIONS  Rest at home for 1-2 days following your procedure or as directed by your health care provider.  If you go home right after the procedure, plan to have someone with you for 24 hours.  Do not take a bathor shower for 24 hours after your procedure.  Take medicines only as directed by your health care provider. Ask your health care provider when you can resume taking any normal medicines.  Change bandages (dressings) as directed.   You may be told to record the amount of drainage from the bag every time you empty it. Follow your health care provider's directions for emptying the bag. Write down the amount of drainage, the date, and the time you emptied it.  Call your health care provider when the drain is putting out less than 10 mL of drainage per day for 2-3 days in a row or as directed by your health care provider.  Follow your health care provider's instructions for cleaning the drainage tube. You may need to clean the tube every day so that it does not clog. SEEK MEDICAL CARE IF:  You have increased bleeding (more than a small  spot) from the site where the drainage tube was placed.  You have redness, swelling, or increasing pain around the site where the drainage tube was placed.  You notice a discharge or bad smell coming from the site where the drainage tube was placed.  You have a fever or chills.  You have pain that is not helped by medicine.  SEEK IMMEDIATE MEDICAL CARE IF:  There is leakage around the drainage tube.  The drainage tube pulls out.  You suddenly stop having drainage from the tube.  You suddenly have blood in the drainage fluid.  You become dizzy or faint.  You develop a rash.   You have nausea or vomiting.  You have difficulty breathing, feel short of breath, or feel faint.   You develop chest pain.  You have problems with your speech or vision.  You have trouble balancing or moving your arms or legs. Document Released: 12/08/2013 Document Reviewed: 09/12/2013 [facility] Patient Information 2015 [place]. This information is not intended to replace advice given to you by your health care provider. Make sure you discuss any questions you have with your health care provider.

## 2014-05-04 NOTE — Progress Notes (Signed)
Patient examined and I agree with the assessment and plan Plan also D/W his wife. Georganna Skeans, MD, MPH, FACS Trauma: [phone] General Surgery: [phone]  05/04/2014 10:40 AM

## 2014-05-05 NOTE — Discharge Summary (Signed)
Perry Callas, MD, MPH, FACS Trauma: [phone] General Surgery: [phone]  

## 2014-05-08 ENCOUNTER — Other Ambulatory Visit (INDEPENDENT_AMBULATORY_CARE_PROVIDER_SITE_OTHER): Payer: Self-pay | Admitting: *Deleted

## 2014-05-08 ENCOUNTER — Other Ambulatory Visit: Payer: Self-pay

## 2014-05-08 ENCOUNTER — Other Ambulatory Visit (INDEPENDENT_AMBULATORY_CARE_PROVIDER_SITE_OTHER): Payer: Self-pay | Admitting: General Surgery

## 2014-05-08 DIAGNOSIS — Z9049 Acquired absence of other specified parts of digestive tract: Secondary | ICD-10-CM

## 2014-05-08 DIAGNOSIS — Z09 Encounter for follow-up examination after completed treatment for conditions other than malignant neoplasm: Secondary | ICD-10-CM

## 2014-05-13 ENCOUNTER — Ambulatory Visit (HOSPITAL_COMMUNITY)
Admission: RE | Admit: 2014-05-13 | Discharge: 2014-05-13 | Disposition: A | Discharge status: Home / Self Care | Payer: Medicare Other | Source: Ambulatory Visit | Attending: General Surgery | Admitting: General Surgery

## 2014-05-13 ENCOUNTER — Other Ambulatory Visit (INDEPENDENT_AMBULATORY_CARE_PROVIDER_SITE_OTHER): Payer: Self-pay | Admitting: General Surgery

## 2014-05-13 DIAGNOSIS — Z9049 Acquired absence of other specified parts of digestive tract: Secondary | ICD-10-CM

## 2014-05-13 DIAGNOSIS — K353 Acute appendicitis with localized peritonitis: Secondary | ICD-10-CM | POA: Insufficient documentation

## 2014-05-13 DIAGNOSIS — K3533 Acute appendicitis with perforation and localized peritonitis, with abscess: Secondary | ICD-10-CM

## 2014-05-13 MED ORDER — ACETAMINOPHEN 325 MG PO TABS
ORAL_TABLET | ORAL | Status: AC
  Filled 2014-05-13: qty 1

## 2014-05-13 MED ORDER — ACETAMINOPHEN 325 MG PO TABS
650.0000 mg | ORAL_TABLET | Freq: Once | ORAL | Status: DC
  Filled 2014-05-13: qty 2

## 2014-05-14 ENCOUNTER — Encounter: Payer: Self-pay | Admitting: Neurology

## 2014-05-15 ENCOUNTER — Telehealth (INDEPENDENT_AMBULATORY_CARE_PROVIDER_SITE_OTHER): Payer: Self-pay

## 2014-05-15 DIAGNOSIS — K651 Peritoneal abscess: Principal | ICD-10-CM

## 2014-05-15 DIAGNOSIS — T814XXD Infection following a procedure, subsequent encounter: Principal | ICD-10-CM

## 2014-05-15 DIAGNOSIS — IMO0001 Reserved for inherently not codable concepts without codable children: Secondary | ICD-10-CM

## 2014-05-15 MED ORDER — METRONIDAZOLE 500 MG PO TABS: 500.0000 mg | ORAL_TABLET | Freq: Three times a day (TID) | ORAL | Status: DC

## 2014-05-15 MED ORDER — CIPROFLOXACIN HCL 500 MG PO TABS: 500.0000 mg | ORAL_TABLET | Freq: Two times a day (BID) | ORAL | Status: DC

## 2014-05-15 NOTE — Telephone Encounter (Signed)
Called pt to notify him that Dr Donne Hazel wants pt to stay on his abx's till he see's Dr Donne Hazel next week. The pt was given an appt for 10/14 to see [place]. I refilled his Cipro and Flagyl e prescribed to [facility] per pt's request.

## 2014-05-28 ENCOUNTER — Telehealth: Payer: Self-pay | Admitting: *Deleted

## 2014-05-28 NOTE — Telephone Encounter (Signed)
Yes, he can discuss this with me or Perry [name], hope he is feeling well. CD

## 2014-05-28 NOTE — Telephone Encounter (Signed)
Took a call from [place] at [facility] that this patient is non compliant with CPAP and is attempting to turn his CPAP machine back in to the DME.  He is stating that over the last few months he has lost 30 plus pounds and no longer has sleep apnea.  Patient is scheduled for a follow up visit and his concerns can be addressed then.

## 2014-06-16 DIAGNOSIS — I2510 Atherosclerotic heart disease of native coronary artery without angina pectoris: Secondary | ICD-10-CM | POA: Diagnosis not present

## 2014-06-16 DIAGNOSIS — I480 Paroxysmal atrial fibrillation: Secondary | ICD-10-CM | POA: Diagnosis not present

## 2014-06-16 DIAGNOSIS — I10 Essential (primary) hypertension: Secondary | ICD-10-CM | POA: Diagnosis not present

## 2014-06-23 ENCOUNTER — Other Ambulatory Visit (INDEPENDENT_AMBULATORY_CARE_PROVIDER_SITE_OTHER): Payer: Self-pay | Admitting: General Surgery

## 2014-06-23 ENCOUNTER — Encounter (HOSPITAL_COMMUNITY): Payer: Self-pay | Admitting: *Deleted

## 2014-06-23 MED ORDER — DEXTROSE 5 % IV SOLN
2.0000 g | INTRAVENOUS | Status: AC
  Administered 2014-06-24: 2 g via INTRAVENOUS
  Filled 2014-06-23: qty 2

## 2014-06-23 NOTE — Progress Notes (Signed)
No pre-op orders in EPIC. Called Dr. Cristal Generous office and spoke with Lavella Lemons requesting orders.

## 2014-06-23 NOTE — Progress Notes (Signed)
Pt states he went into A-fib while in hospital after his surgery in September. He states his rate converted back to normal rhythm while in the hospital. States he saw Dr. Wynonia Lawman last week and had an EKG and was told it was a normal rhythm. Dr. Wynonia Lawman aware that pt is having surgery. Called Dr. Thurman Coyer office and requested most recent EKG and last OV notes.   Pt states he does not use a cpap for sleep apnea because it made him have nosebleeds. He states he's sleeping fine now since stopping Xanax.

## 2014-06-24 ENCOUNTER — Ambulatory Visit (HOSPITAL_COMMUNITY): Payer: Medicare Other | Admitting: Certified Registered Nurse Anesthetist

## 2014-06-24 ENCOUNTER — Observation Stay (HOSPITAL_COMMUNITY)
Admission: RE | Admit: 2014-06-24 | Discharge: 2014-06-27 | Disposition: A | Discharge status: Home / Self Care | Payer: Medicare Other | Source: Ambulatory Visit | Attending: General Surgery | Admitting: General Surgery

## 2014-06-24 ENCOUNTER — Encounter (HOSPITAL_COMMUNITY)
Admission: RE | Disposition: A | Discharge status: Home / Self Care | Payer: Self-pay | Source: Ambulatory Visit | Attending: General Surgery

## 2014-06-24 ENCOUNTER — Encounter (HOSPITAL_COMMUNITY): Payer: Self-pay | Admitting: *Deleted

## 2014-06-24 DIAGNOSIS — I10 Essential (primary) hypertension: Secondary | ICD-10-CM | POA: Insufficient documentation

## 2014-06-24 DIAGNOSIS — R0602 Shortness of breath: Secondary | ICD-10-CM | POA: Diagnosis not present

## 2014-06-24 DIAGNOSIS — E669 Obesity, unspecified: Secondary | ICD-10-CM | POA: Insufficient documentation

## 2014-06-24 DIAGNOSIS — E785 Hyperlipidemia, unspecified: Secondary | ICD-10-CM | POA: Diagnosis not present

## 2014-06-24 DIAGNOSIS — H9190 Unspecified hearing loss, unspecified ear: Secondary | ICD-10-CM | POA: Insufficient documentation

## 2014-06-24 DIAGNOSIS — Z951 Presence of aortocoronary bypass graft: Secondary | ICD-10-CM | POA: Diagnosis not present

## 2014-06-24 DIAGNOSIS — K352 Acute appendicitis with generalized peritonitis: Secondary | ICD-10-CM | POA: Diagnosis not present

## 2014-06-24 DIAGNOSIS — I509 Heart failure, unspecified: Secondary | ICD-10-CM | POA: Diagnosis not present

## 2014-06-24 DIAGNOSIS — Z7982 Long term (current) use of aspirin: Secondary | ICD-10-CM | POA: Insufficient documentation

## 2014-06-24 DIAGNOSIS — Z87891 Personal history of nicotine dependence: Secondary | ICD-10-CM | POA: Insufficient documentation

## 2014-06-24 DIAGNOSIS — E119 Type 2 diabetes mellitus without complications: Secondary | ICD-10-CM | POA: Diagnosis not present

## 2014-06-24 DIAGNOSIS — I2510 Atherosclerotic heart disease of native coronary artery without angina pectoris: Secondary | ICD-10-CM | POA: Diagnosis not present

## 2014-06-24 DIAGNOSIS — K388 Other specified diseases of appendix: Secondary | ICD-10-CM | POA: Diagnosis not present

## 2014-06-24 DIAGNOSIS — I4891 Unspecified atrial fibrillation: Secondary | ICD-10-CM | POA: Diagnosis not present

## 2014-06-24 DIAGNOSIS — I252 Old myocardial infarction: Secondary | ICD-10-CM | POA: Insufficient documentation

## 2014-06-24 DIAGNOSIS — Z888 Allergy status to other drugs, medicaments and biological substances status: Secondary | ICD-10-CM | POA: Insufficient documentation

## 2014-06-24 DIAGNOSIS — Z6830 Body mass index (BMI) 30.0-30.9, adult: Secondary | ICD-10-CM | POA: Insufficient documentation

## 2014-06-24 DIAGNOSIS — K37 Unspecified appendicitis: Secondary | ICD-10-CM | POA: Diagnosis present

## 2014-06-24 HISTORY — DX: Unspecified atrial fibrillation: I48.91

## 2014-06-24 HISTORY — PX: LAPAROSCOPIC APPENDECTOMY: SHX408

## 2014-06-24 LAB — BASIC METABOLIC PANEL
Anion gap: 14 (ref 5–15)
BUN: 22 mg/dL (ref 6–23)
CALCIUM: 9.5 mg/dL (ref 8.4–10.5)
CO2: 20 mEq/L (ref 19–32)
Chloride: 106 mEq/L (ref 96–112)
Creatinine, Ser: 0.77 mg/dL (ref 0.50–1.35)
GFR calc Af Amer: >90 mL/min (ref >90)
GFR calc non Af Amer: >90 mL/min (ref >90)
GLUCOSE: 95 mg/dL (ref 70–99)
Potassium: 4.1 mEq/L (ref 3.7–5.3)
Sodium: 140 mEq/L (ref 137–147)

## 2014-06-24 LAB — CBC WITH DIFFERENTIAL/PLATELET
BASOS PCT: 0 % (ref 0–1)
Basophils Absolute: 0 10*3/uL (ref 0.0–0.1)
EOS PCT: 3 % (ref 0–5)
Eosinophils Absolute: 0.1 10*3/uL (ref 0.0–0.7)
HEMATOCRIT: 36.8 % — AB (ref 39.0–52.0)
Hemoglobin: 12.7 g/dL — ABNORMAL LOW (ref 13.0–17.0)
LYMPHS PCT: 31 % (ref 12–46)
Lymphs Abs: 1.5 10*3/uL (ref 0.7–4.0)
MCH: 30.4 pg (ref 26.0–34.0)
MCHC: 34.5 g/dL (ref 30.0–36.0)
MCV: 88 fL (ref 78.0–100.0)
MONO ABS: 0.4 10*3/uL (ref 0.1–1.0)
Monocytes Relative: 9 % (ref 3–12)
Neutro Abs: 2.7 10*3/uL (ref 1.7–7.7)
Neutrophils Relative %: 57 % (ref 43–77)
Platelets: 182 10*3/uL (ref 150–400)
RBC: 4.18 MIL/uL — ABNORMAL LOW (ref 4.22–5.81)
RDW: 13.4 % (ref 11.5–15.5)
WBC: 4.7 10*3/uL (ref 4.0–10.5)

## 2014-06-24 LAB — GLUCOSE, CAPILLARY: Glucose-Capillary: 94 mg/dL (ref 70–99)

## 2014-06-24 SURGERY — APPENDECTOMY, LAPAROSCOPIC
Anesthesia: General | Site: Abdomen

## 2014-06-24 MED ORDER — SODIUM CHLORIDE 0.9 % IV SOLN
INTRAVENOUS | Status: DC
  Administered 2014-06-24 – 2014-06-25 (×2): via INTRAVENOUS

## 2014-06-24 MED ORDER — MORPHINE SULFATE 2 MG/ML IJ SOLN
2.0000 mg | INTRAMUSCULAR | Status: DC | PRN
  Administered 2014-06-24: 2 mg via INTRAVENOUS
  Filled 2014-06-24: qty 1

## 2014-06-24 MED ORDER — ROCURONIUM BROMIDE 50 MG/5ML IV SOLN
INTRAVENOUS | Status: AC
  Filled 2014-06-24: qty 1

## 2014-06-24 MED ORDER — BUPIVACAINE-EPINEPHRINE (PF) 0.25% -1:200000 IJ SOLN
INTRAMUSCULAR | Status: AC
  Filled 2014-06-24: qty 30

## 2014-06-24 MED ORDER — LACTATED RINGERS IV SOLN
INTRAVENOUS | Status: DC | PRN
  Administered 2014-06-24 (×2): via INTRAVENOUS

## 2014-06-24 MED ORDER — ACETAMINOPHEN 650 MG RE SUPP: 650.0000 mg | Freq: Four times a day (QID) | RECTAL | Status: DC | PRN

## 2014-06-24 MED ORDER — OXYCODONE HCL 5 MG PO TABS
5.0000 mg | ORAL_TABLET | ORAL | Status: DC | PRN
  Administered 2014-06-24: 5 mg via ORAL
  Filled 2014-06-24: qty 1

## 2014-06-24 MED ORDER — NEOSTIGMINE METHYLSULFATE 10 MG/10ML IV SOLN
INTRAVENOUS | Status: DC | PRN
Start: 2014-06-24 — End: 2014-06-24
  Administered 2014-06-24: 4 mg via INTRAVENOUS

## 2014-06-24 MED ORDER — OXYCODONE HCL 5 MG/5ML PO SOLN
5.0000 mg | Freq: Once | ORAL | Status: DC | PRN
Start: 2014-06-24 — End: 2014-06-24

## 2014-06-24 MED ORDER — NEOSTIGMINE METHYLSULFATE 10 MG/10ML IV SOLN
INTRAVENOUS | Status: AC
  Filled 2014-06-24: qty 1

## 2014-06-24 MED ORDER — ROCURONIUM BROMIDE 100 MG/10ML IV SOLN
INTRAVENOUS | Status: DC | PRN
  Administered 2014-06-24: 50 mg via INTRAVENOUS

## 2014-06-24 MED ORDER — HYDROMORPHONE HCL 1 MG/ML IJ SOLN
1.0000 mg | INTRAMUSCULAR | Status: DC | PRN
  Administered 2014-06-24 – 2014-06-25 (×3): 2 mg via INTRAVENOUS
  Filled 2014-06-24 (×3): qty 2

## 2014-06-24 MED ORDER — HYDROMORPHONE HCL 1 MG/ML IJ SOLN
0.2500 mg | INTRAMUSCULAR | Status: DC | PRN
  Administered 2014-06-24 (×2): 0.5 mg via INTRAVENOUS

## 2014-06-24 MED ORDER — OXYCODONE HCL 5 MG PO TABS
5.0000 mg | ORAL_TABLET | ORAL | Status: DC | PRN
  Administered 2014-06-25 – 2014-06-26 (×6): 10 mg via ORAL
  Filled 2014-06-24 (×6): qty 2

## 2014-06-24 MED ORDER — MIDAZOLAM HCL 5 MG/5ML IJ SOLN
INTRAMUSCULAR | Status: DC | PRN
  Administered 2014-06-24: 2 mg via INTRAVENOUS

## 2014-06-24 MED ORDER — ONDANSETRON HCL 4 MG/2ML IJ SOLN
INTRAMUSCULAR | Status: AC
  Filled 2014-06-24: qty 2

## 2014-06-24 MED ORDER — DEXTROSE 5 % IV SOLN
2.0000 g | Freq: Four times a day (QID) | INTRAVENOUS | Status: AC
  Administered 2014-06-24 – 2014-06-25 (×3): 2 g via INTRAVENOUS
  Filled 2014-06-24 (×3): qty 2

## 2014-06-24 MED ORDER — HYDROMORPHONE HCL 1 MG/ML IJ SOLN
INTRAMUSCULAR | Status: AC
  Filled 2014-06-24: qty 1

## 2014-06-24 MED ORDER — PROPOFOL 10 MG/ML IV BOLUS
INTRAVENOUS | Status: DC | PRN
  Administered 2014-06-24: 200 mg via INTRAVENOUS
  Administered 2014-06-24: 50 mg via INTRAVENOUS

## 2014-06-24 MED ORDER — FENTANYL CITRATE 0.05 MG/ML IJ SOLN
INTRAMUSCULAR | Status: DC | PRN
  Administered 2014-06-24: 100 ug via INTRAVENOUS
  Administered 2014-06-24 (×3): 50 ug via INTRAVENOUS
  Administered 2014-06-24: 100 ug via INTRAVENOUS

## 2014-06-24 MED ORDER — GLYCOPYRROLATE 0.2 MG/ML IJ SOLN
INTRAMUSCULAR | Status: AC
  Filled 2014-06-24: qty 3

## 2014-06-24 MED ORDER — PANTOPRAZOLE SODIUM 40 MG IV SOLR
40.0000 mg | Freq: Every day | INTRAVENOUS | Status: DC
  Administered 2014-06-24 – 2014-06-26 (×3): 40 mg via INTRAVENOUS
  Filled 2014-06-24 (×3): qty 40

## 2014-06-24 MED ORDER — LACTATED RINGERS IV SOLN
INTRAVENOUS | Status: DC
  Administered 2014-06-24: 14:00:00 via INTRAVENOUS

## 2014-06-24 MED ORDER — BUPIVACAINE-EPINEPHRINE 0.25% -1:200000 IJ SOLN
INTRAMUSCULAR | Status: DC | PRN
  Administered 2014-06-24: 30 mL

## 2014-06-24 MED ORDER — LIDOCAINE HCL (CARDIAC) 20 MG/ML IV SOLN
INTRAVENOUS | Status: DC | PRN
  Administered 2014-06-24: 80 mg via INTRAVENOUS

## 2014-06-24 MED ORDER — 0.9 % SODIUM CHLORIDE (POUR BTL) OPTIME
TOPICAL | Status: DC | PRN
  Administered 2014-06-24: 1000 mL

## 2014-06-24 MED ORDER — HEPARIN SODIUM (PORCINE) 5000 UNIT/ML IJ SOLN
5000.0000 [IU] | Freq: Three times a day (TID) | INTRAMUSCULAR | Status: DC
  Administered 2014-06-25 – 2014-06-27 (×7): 5000 [IU] via SUBCUTANEOUS
  Filled 2014-06-24 (×9): qty 1

## 2014-06-24 MED ORDER — LIDOCAINE HCL (CARDIAC) 20 MG/ML IV SOLN
INTRAVENOUS | Status: AC
  Filled 2014-06-24: qty 5

## 2014-06-24 MED ORDER — FENTANYL CITRATE 0.05 MG/ML IJ SOLN
INTRAMUSCULAR | Status: AC
  Filled 2014-06-24: qty 5

## 2014-06-24 MED ORDER — MIDAZOLAM HCL 2 MG/2ML IJ SOLN
INTRAMUSCULAR | Status: AC
  Filled 2014-06-24: qty 2

## 2014-06-24 MED ORDER — GLYCOPYRROLATE 0.2 MG/ML IJ SOLN
INTRAMUSCULAR | Status: DC | PRN
  Administered 2014-06-24: 0.6 mg via INTRAVENOUS

## 2014-06-24 MED ORDER — OXYCODONE HCL 5 MG PO TABS: 5.0000 mg | ORAL_TABLET | Freq: Once | ORAL | Status: DC | PRN

## 2014-06-24 MED ORDER — ONDANSETRON HCL 4 MG/2ML IJ SOLN
INTRAMUSCULAR | Status: DC | PRN
  Administered 2014-06-24: 4 mg via INTRAVENOUS

## 2014-06-24 MED ORDER — ONDANSETRON HCL 4 MG/2ML IJ SOLN: 4.0000 mg | Freq: Four times a day (QID) | INTRAMUSCULAR | Status: DC | PRN

## 2014-06-24 MED ORDER — PROPOFOL 10 MG/ML IV BOLUS
INTRAVENOUS | Status: AC
  Filled 2014-06-24: qty 20

## 2014-06-24 MED ORDER — SODIUM CHLORIDE 0.9 % IR SOLN
Status: DC | PRN
  Administered 2014-06-24: 1000 mL

## 2014-06-24 MED ORDER — ACETAMINOPHEN 325 MG PO TABS: 650.0000 mg | ORAL_TABLET | Freq: Four times a day (QID) | ORAL | Status: DC | PRN

## 2014-06-24 MED ORDER — METOPROLOL SUCCINATE ER 25 MG PO TB24
25.0000 mg | ORAL_TABLET | Freq: Every day | ORAL | Status: DC
  Administered 2014-06-25 – 2014-06-27 (×3): 25 mg via ORAL
  Filled 2014-06-24 (×4): qty 1

## 2014-06-24 SURGICAL SUPPLY — 40 items
APPLIER CLIP ROT 10 11.4 M/L (STAPLE)
CANISTER SUCTION 2500CC (MISCELLANEOUS) ×2 IMPLANT
CHLORAPREP W/TINT 26ML (MISCELLANEOUS) ×2 IMPLANT
CLIP APPLIE ROT 10 11.4 M/L (STAPLE) IMPLANT
CLSR STERI-STRIP ANTIMIC 1/2X4 (GAUZE/BANDAGES/DRESSINGS) ×2 IMPLANT
COVER SURGICAL LIGHT HANDLE (MISCELLANEOUS) ×2 IMPLANT
CUTTER FLEX LINEAR 45M (STAPLE) ×2 IMPLANT
DERMABOND ADVANCED (GAUZE/BANDAGES/DRESSINGS) ×1
DERMABOND ADVANCED .7 DNX12 (GAUZE/BANDAGES/DRESSINGS) ×1 IMPLANT
DEVICE TROCAR PUNCTURE CLOSURE (ENDOMECHANICALS) ×2 IMPLANT
DRAPE LAPAROSCOPIC ABDOMINAL (DRAPES) ×2 IMPLANT
ELECT REM PT RETURN 9FT ADLT (ELECTROSURGICAL) ×2
ELECTRODE REM PT RTRN 9FT ADLT (ELECTROSURGICAL) ×1 IMPLANT
GLOVE BIO SURGEON STRL SZ7 (GLOVE) ×2 IMPLANT
GLOVE BIOGEL PI IND STRL 7.5 (GLOVE) ×1 IMPLANT
GLOVE BIOGEL PI INDICATOR 7.5 (GLOVE) ×1
GOWN STRL REUS W/ TWL LRG LVL3 (GOWN DISPOSABLE) ×2 IMPLANT
GOWN STRL REUS W/TWL LRG LVL3 (GOWN DISPOSABLE) ×2
KIT BASIN OR (CUSTOM PROCEDURE TRAY) ×2 IMPLANT
KIT ROOM TURNOVER OR (KITS) ×2 IMPLANT
NS IRRIG 1000ML POUR BTL (IV SOLUTION) ×2 IMPLANT
PAD ARMBOARD 7.5X6 YLW CONV (MISCELLANEOUS) ×2 IMPLANT
POUCH RETRIEVAL ECOSAC 10 (ENDOMECHANICALS) ×1 IMPLANT
POUCH RETRIEVAL ECOSAC 10MM (ENDOMECHANICALS) ×1
RELOAD 45 VASCULAR/THIN (ENDOMECHANICALS) ×2 IMPLANT
RELOAD STAPLE TA45 3.5 REG BLU (ENDOMECHANICALS) ×2 IMPLANT
SCALPEL HARMONIC ACE (MISCELLANEOUS) ×2 IMPLANT
SCISSORS LAP 5X35 DISP (ENDOMECHANICALS) IMPLANT
SET IRRIG TUBING LAPAROSCOPIC (IRRIGATION / IRRIGATOR) ×2 IMPLANT
SLEEVE ENDOPATH XCEL 5M (ENDOMECHANICALS) ×2 IMPLANT
SPECIMEN JAR SMALL (MISCELLANEOUS) ×2 IMPLANT
SUT MNCRL AB 4-0 PS2 18 (SUTURE) ×2 IMPLANT
SUT VICRYL 0 UR6 27IN ABS (SUTURE) ×6 IMPLANT
TOWEL OR 17X24 6PK STRL BLUE (TOWEL DISPOSABLE) ×2 IMPLANT
TOWEL OR 17X26 10 PK STRL BLUE (TOWEL DISPOSABLE) ×2 IMPLANT
TRAY FOLEY CATH 16FR SILVER (SET/KITS/TRAYS/PACK) ×2 IMPLANT
TRAY LAPAROSCOPIC (CUSTOM PROCEDURE TRAY) ×2 IMPLANT
TROCAR XCEL BLUNT TIP 100MML (ENDOMECHANICALS) ×2 IMPLANT
TROCAR XCEL NON-BLD 5MMX100MML (ENDOMECHANICALS) ×2 IMPLANT
TUBING INSUFFLATION (TUBING) ×2 IMPLANT

## 2014-06-24 NOTE — Anesthesia Preprocedure Evaluation (Signed)
Anesthesia Evaluation  Patient identified by MRN, date of birth, ID band Patient awake    Reviewed: Allergy & Precautions, H&P , NPO status , Patient's Chart, lab work & pertinent test results  History of Anesthesia Complications Negative for: history of anesthetic complications  Airway Mallampati: II  TM Distance: >3 FB Neck ROM: Full    Dental  (+) Teeth Intact   Pulmonary neg sleep apnea, neg COPDformer smoker,  breath sounds clear to auscultation        Cardiovascular hypertension, Pt. on medications and Pt. on home beta blockers - angina+ CAD, + Past MI, + CABG and +CHF + dysrhythmias Atrial Fibrillation Rhythm:Regular     Neuro/Psych negative neurological ROS  negative psych ROS   GI/Hepatic negative GI ROS, Neg liver ROS,   Endo/Other  diabetes, Type 2  Renal/GU      Musculoskeletal   Abdominal   Peds  Hematology negative hematology ROS (+)   Anesthesia Other Findings   Reproductive/Obstetrics                             Anesthesia Physical Anesthesia Plan  ASA: III  Anesthesia Plan: General   Post-op Pain Management:    Induction: Intravenous  Airway Management Planned: Oral ETT  Additional Equipment: None  Intra-op Plan:   Post-operative Plan: Extubation in OR  Informed Consent: I have reviewed the patients History and Physical, chart, labs and discussed the procedure including the risks, benefits and alternatives for the proposed anesthesia with the patient or authorized representative who has indicated his/her understanding and acceptance.   Dental advisory given  Plan Discussed with: CRNA and Surgeon  Anesthesia Plan Comments:         Anesthesia Quick Evaluation

## 2014-06-24 NOTE — Transfer of Care (Signed)
Immediate Anesthesia Transfer of Care Note  Patient: Perry [name]  Procedure(s) Performed: Procedure(s): APPENDECTOMY LAPAROSCOPIC  (N/A)  Patient Location: PACU  Anesthesia Type:General  Level of Consciousness: awake, alert , oriented, patient cooperative and responds to stimulation  Airway & Oxygen Therapy: Patient Spontanous Breathing and Patient connected to nasal cannula oxygen  Post-op Assessment: Report given to PACU RN, Post -op Vital signs reviewed and stable and Patient moving all extremities X 4  Post vital signs: Reviewed and stable  Complications: No apparent anesthesia complications

## 2014-06-24 NOTE — Op Note (Signed)
Preoperative diagnosis: acute appendicitis s/p perforation with residual appendix Postoperative diagnosis: same as above Procedure: Laparoscopic appendectomy Surgeon: Dr Serita Grammes Anesthesia: general EBL; minimal Drains: none Specimens appendix to pathology Complications none Disposition to recovery stable Sponge and needle count correct  Indications: This is a 3 yom who presented with acute appendicitis. It appeared simple acute appendicitis and he was admitted by one of my partners. I saw him the following morning where he was noted to be tender in his rlq and appeared ill. I took him to or and did laparoscopic exploration. He had perforated appendicitis with a lot of pus present. I thought I removed his entire appendix but clearly did not as he developed a large abscess requiring drainage. This eventually healed and he is doing well.  CT shows residual appendix and I discussed returning to remove the remainder of this.  He understood risks and benefits.  Procedure:After informed consent was obtained the patient was taken to the operating room. He was given antibiotics. Sequential compression devices were on his legs. He was in place and general anesthesia without complication. His abdomen was prepped and draped in the standard sterile surgical fashion. Surgical timeout was performed. He had a Foley catheter in place.  I will try to Marcaine at his umbilicus and went through his old incision. I was able to identify the fashion incising sharply. I entered the peritoneum bluntly. I used 2 old incisions and his suprapubic region and left lower quadrant to insert 5 mm trochars also. He is noted to have some adhesions to his anterior abdominal wall which I lysed sharply. Had a lot of adhesions indicative of a prior infection is right lower quadrant that I was able to release with a combination of sharp sharp dissection and blunt dissection. Eventually I was able to identify the terminal ileum. His  small bowel was adherent to the appendix. I was able to release this. I inspected the small bowel several times and it was no evidence of any injury. Eventually I was able to rotate the colon medially once I had done this I was able to identify the appendiceal stump. I then was able to identify the base this time. I was able to identify the terminal ileum, cecum, and the appendix. I encircled the base. By one of my partners look at this as well who agree. I then used the Harmonic to come across the remainder of the mesentery.I then used the GIA stapler to come across the appendix.  I then placed it in a bag and removed it.  The staple line was intact and hemostatic. I irrigated until this was clear.  I then removed the hasson trocar and tied my pursetring suture.  I then used the endoclose device with 0 vicryl to place several other stitches.  I then removed the trocars and desufflated the abdomen.  I then closed with 4-0 monocryl and glue. He tolerated this well, was extubated and then transferred to recovery room in stable condition

## 2014-06-24 NOTE — H&P (Signed)
Perry [name] is an 68 y.o. male.   Chief Complaint: appendicitis  HPI: 42 yom who had perforated appendicitis not seen on initial ct that underwent attempt at lap appy complicated by leak and abscess. He has residual appendix left. This has all healed and he is doing well now. We discussed proceeding to or for lap appy possible open. He is having bms, no fevers, eating well and is back to his normal self.  He did have postop afib last time.  Past Medical History  Diagnosis Date  . CAD (coronary artery disease)   . Hernia, inguinal     right  . Hyperglycemia   . Hearing loss     mild  . Hypertension   . Old inferior wall myocardial infarction 2004  . Lumbar disc disease   . CAD (coronary artery disease) of artery bypass graft 05/04/2014  . Dyslipidemia 05/04/2014  . Acute appendicitis with perforation and peritoneal abscess 05/04/2014  . Ileus, postoperative 05/04/2014  . Abscess of abdominal cavity 05/04/2014  . A-fib 04/2014  . CHF (congestive heart failure)     pt not aware  . Diabetes mellitus     not on medication - pt not aware of this    Past Surgical History  Procedure Laterality Date  . Lumbar laminectomy  2001    lumbar  . Coronary artery bypass graft  2004    LIMA to LAD and SVG to RCA (emergent)  . Coronary angioplasty      multiple  . Laparoscopic appendectomy N/A 04/21/2014    Procedure: APPENDECTOMY LAPAROSCOPIC;  Surgeon: Rolm Bookbinder, MD;  Location: [place];  Service: General;  Laterality: N/A;  . Tonsillectomy    . Colonoscopy      Family History  Problem Relation Age of Onset  . Heart disease Father   . Heart disease Brother   . Cancer Brother    Social History:  reports that he quit smoking about 35 years ago. His smoking use included Cigarettes. He smoked 0.00 packs per day. He has quit using smokeless tobacco. His smokeless tobacco use included Chew. He reports that he drinks about 4.2 oz of alcohol per week. He reports that he does not use illicit  drugs.  Allergies:  Allergies  Allergen Reactions  . Xanax [Alprazolam] Shortness Of Breath    Medications Prior to Admission  Medication Sig Dispense Refill  . aspirin 81 MG chewable tablet Chew 81 mg by mouth daily.    Marland Kitchen bismuth subsalicylate (PEPTO BISMOL) 262 MG chewable tablet Chew 524 mg by mouth as needed for indigestion.    . fenofibrate (TRICOR) 145 MG tablet Take 145 mg by mouth daily.      . fish oil-omega-3 fatty acids 1000 MG capsule Take 2 g by mouth daily.      Marland Kitchen lisinopril (PRINIVIL,ZESTRIL) 40 MG tablet Take 1/2 tablet for now and follow up with your primary care doctor (Patient taking differently: Take 20 mg by mouth daily. )    . metoprolol (TOPROL-XL) 50 MG 24 hr tablet Take 25 mg by mouth daily.     . NON FORMULARY Taking a probiotic once a day    . rosuvastatin (CRESTOR) 5 MG tablet Take 5 mg by mouth daily.      Marland Kitchen acetaminophen (TYLENOL) 325 MG tablet Take 2 tablets (650 mg total) by mouth every 4 (four) hours as needed for fever (temp 101).    . ciprofloxacin (CIPRO) 500 MG tablet Take 1 tablet (500 mg total)  by mouth 2 (two) times daily. 20 tablet 1  . metroNIDAZOLE (FLAGYL) 500 MG tablet Take 1 tablet (500 mg total) by mouth every 8 (eight) hours. 30 tablet 1  . oxyCODONE-acetaminophen (PERCOCET/ROXICET) 5-325 MG per tablet You can take 1-2 every 4 hours.  This has acetaminophen in it.(Tylenol)  You cannot take more than 4000 mg of acetaminophen per day, and you have to include this in your total. 30 tablet 0    Results for orders placed or performed during the hospital encounter of 06/24/14 (from the past 48 hour(s))  Glucose, capillary     Status: None   Collection Time: 06/24/14  1:27 PM  Result Value Ref Range   Glucose-Capillary 94 70 - 99 mg/dL  Basic metabolic panel     Status: None   Collection Time: 06/24/14  1:40 PM  Result Value Ref Range   Sodium 140 137 - 147 mEq/L   Potassium 4.1 3.7 - 5.3 mEq/L    Comment: HEMOLYSIS AT THIS LEVEL MAY AFFECT  RESULT   Chloride 106 96 - 112 mEq/L   CO2 20 19 - 32 mEq/L   Glucose, Bld 95 70 - 99 mg/dL   BUN 22 6 - 23 mg/dL   Creatinine, Ser 0.77 0.50 - 1.35 mg/dL   Calcium 9.5 8.4 - 10.5 mg/dL   GFR calc non Af Amer >90 >90 mL/min   GFR calc Af Amer >90 >90 mL/min    Comment: (NOTE) The eGFR has been calculated using the CKD EPI equation. This calculation has not been validated in all clinical situations. eGFR's persistently <90 mL/min signify possible Chronic Kidney Disease.    Anion gap 14 5 - 15  CBC WITH DIFFERENTIAL     Status: Abnormal   Collection Time: 06/24/14  1:40 PM  Result Value Ref Range   WBC 4.7 4.0 - 10.5 K/uL   RBC 4.18 (L) 4.22 - 5.81 MIL/uL   Hemoglobin 12.7 (L) 13.0 - 17.0 g/dL   HCT 36.8 (L) 39.0 - 52.0 %   MCV 88.0 78.0 - 100.0 fL   MCH 30.4 26.0 - 34.0 pg   MCHC 34.5 30.0 - 36.0 g/dL   RDW 13.4 11.5 - 15.5 %   Platelets 182 150 - 400 K/uL   Neutrophils Relative % 57 43 - 77 %   Neutro Abs 2.7 1.7 - 7.7 K/uL   Lymphocytes Relative 31 12 - 46 %   Lymphs Abs 1.5 0.7 - 4.0 K/uL   Monocytes Relative 9 3 - 12 %   Monocytes Absolute 0.4 0.1 - 1.0 K/uL   Eosinophils Relative 3 0 - 5 %   Eosinophils Absolute 0.1 0.0 - 0.7 K/uL   Basophils Relative 0 0 - 1 %   Basophils Absolute 0.0 0.0 - 0.1 K/uL   No results found.  Review of Systems  Constitutional: Negative for fever and chills.  Respiratory: Negative for shortness of breath.   Cardiovascular: Negative for chest pain.  Gastrointestinal: Negative for nausea, vomiting, abdominal pain, diarrhea, constipation and blood in stool.  Genitourinary: Negative for dysuria and urgency.    Blood pressure 148/74, pulse 63, temperature 97.8 F (36.6 C), temperature source Oral, resp. rate 18, height 6' 1"  (1.854 m), weight 231 lb (104.781 kg), SpO2 100 %. Physical Exam  Vitals reviewed. Constitutional: He appears well-developed and well-nourished.  Cardiovascular: Normal rate, regular rhythm and normal heart sounds.    Respiratory: Effort normal and breath sounds normal. He has no wheezes. He has no rales.  GI: Soft. Bowel sounds are normal. He exhibits no distension. There is no tenderness. No hernia.  Well healed scars   Lymphadenopathy:    He has no cervical adenopathy.     Assessment/Plan Residual appendix with fecatliths  Discussed lap possible open appendectomy due to risk moving forward.  Chisom Aust 06/24/2014, 3:02 PM

## 2014-06-24 NOTE — Anesthesia Postprocedure Evaluation (Signed)
  Anesthesia Post-op Note  Patient: Perry [name]  Procedure(s) Performed: Procedure(s): APPENDECTOMY LAPAROSCOPIC  (N/A)  Patient Location: PACU  Anesthesia Type:General  Level of Consciousness: awake, alert , oriented and patient cooperative  Airway and Oxygen Therapy: Patient Spontanous Breathing  Post-op Pain: mild  Post-op Assessment: Post-op Vital signs reviewed, Patient's Cardiovascular Status Stable, Respiratory Function Stable, Patent Airway, No signs of Nausea or vomiting and Pain level controlled  Post-op Vital Signs: Reviewed and stable  Last Vitals:  Filed Vitals:   06/24/14 1750  BP:   Pulse: 48  Temp:   Resp: 11    Complications: No apparent anesthesia complications

## 2014-06-25 DIAGNOSIS — K352 Acute appendicitis with generalized peritonitis: Secondary | ICD-10-CM | POA: Diagnosis not present

## 2014-06-25 LAB — BASIC METABOLIC PANEL WITH GFR
Anion gap: 9 (ref 5–15)
BUN: 25 mg/dL — ABNORMAL HIGH (ref 6–23)
CO2: 25 meq/L (ref 19–32)
Calcium: 8.9 mg/dL (ref 8.4–10.5)
Chloride: 107 meq/L (ref 96–112)
Creatinine, Ser: 0.93 mg/dL (ref 0.50–1.35)
GFR calc Af Amer: >90 mL/min
GFR calc non Af Amer: 84 mL/min — ABNORMAL LOW
Glucose, Bld: 111 mg/dL — ABNORMAL HIGH (ref 70–99)
Potassium: 4.3 meq/L (ref 3.7–5.3)
Sodium: 141 meq/L (ref 137–147)

## 2014-06-25 LAB — CBC
HCT: 32.5 % — ABNORMAL LOW (ref 39.0–52.0)
Hemoglobin: 10.8 g/dL — ABNORMAL LOW (ref 13.0–17.0)
MCH: 30.2 pg (ref 26.0–34.0)
MCHC: 33.2 g/dL (ref 30.0–36.0)
MCV: 90.8 fL (ref 78.0–100.0)
Platelets: 198 10*3/uL (ref 150–400)
RBC: 3.58 MIL/uL — ABNORMAL LOW (ref 4.22–5.81)
RDW: 14 % (ref 11.5–15.5)
WBC: 6.4 10*3/uL (ref 4.0–10.5)

## 2014-06-25 MED ORDER — KCL IN DEXTROSE-NACL 20-5-0.45 MEQ/L-%-% IV SOLN
INTRAVENOUS | Status: DC
  Administered 2014-06-25 – 2014-06-26 (×3): via INTRAVENOUS
  Filled 2014-06-25 (×5): qty 1000

## 2014-06-25 MED ORDER — KETOROLAC TROMETHAMINE 15 MG/ML IJ SOLN
15.0000 mg | Freq: Four times a day (QID) | INTRAMUSCULAR | Status: AC
  Administered 2014-06-25 (×2): 15 mg via INTRAVENOUS
  Filled 2014-06-25 (×2): qty 1

## 2014-06-25 MED ORDER — DOCUSATE SODIUM 100 MG PO CAPS
100.0000 mg | ORAL_CAPSULE | Freq: Two times a day (BID) | ORAL | Status: DC
  Administered 2014-06-25 – 2014-06-27 (×5): 100 mg via ORAL
  Filled 2014-06-25 (×5): qty 1

## 2014-06-25 MED ORDER — KETOROLAC TROMETHAMINE 15 MG/ML IJ SOLN
15.0000 mg | Freq: Three times a day (TID) | INTRAMUSCULAR | Status: DC | PRN
  Administered 2014-06-25 – 2014-06-27 (×3): 15 mg via INTRAVENOUS
  Filled 2014-06-25 (×4): qty 1

## 2014-06-25 MED ORDER — SODIUM CHLORIDE 0.9 % IV BOLUS (SEPSIS)
500.0000 mL | Freq: Once | INTRAVENOUS | Status: AC
  Administered 2014-06-25: 500 mL via INTRAVENOUS

## 2014-06-25 NOTE — Progress Notes (Signed)
06/25/2014 6:16 AM  Patient placed on cardiac monitoring due to new orders written. CCMD tech was notified and information was given. Will continue to monitor and assess the patient.  [employer], RN-BC, [place]

## 2014-06-25 NOTE — Progress Notes (Signed)
UR completed 

## 2014-06-25 NOTE — Progress Notes (Signed)
1 Day Post-Op  Subjective: Pain rlq this am  Objective: Vital signs in last 24 hours: Temp:  [97 F (36.1 C)-98.2 F (36.8 C)] 98.2 F (36.8 C) (11/19 0555) Pulse Rate:  [47-88] 60 (11/19 0555) Resp:  [11-18] 17 (11/19 0555) BP: (99-148)/(51-93) 99/57 mmHg (11/19 0555) SpO2:  [94 %-100 %] 94 % (11/19 0555) Weight:  [231 lb (104.781 kg)] 231 lb (104.781 kg) (11/18 1343)    Intake/Output from previous day: 11/18 0701 - 11/19 0700 In: 1640 [P.O.:240; I.V.:1400] Out: 800 [Urine:800] Intake/Output this shift: Total I/O In: -  Out: 650 [Urine:650]  General appearance: no distress Resp: clear to auscultation bilaterally Cardio: regular rate and rhythm GI: soft incisions clean approp tender  Lab Results:   Recent Labs  06/24/14 1340 06/25/14 0528  WBC 4.7 6.4  HGB 12.7* 10.8*  HCT 36.8* 32.5*  PLT 182 198   BMET  Recent Labs  06/24/14 1340  NA 140  K 4.1  CL 106  CO2 20  GLUCOSE 95  BUN 22  CREATININE 0.77  CALCIUM 9.5    Anti-infectives: Anti-infectives    Start     Dose/Rate Route Frequency Ordered Stop   06/24/14 2000  cefOXitin (MEFOXIN) 2 g in dextrose 5 % 50 mL IVPB     2 g100 mL/hr over 30 Minutes Intravenous 4 times per day 06/24/14 1829 06/25/14 0624   06/24/14 0600  cefOXitin (MEFOXIN) 2 g in dextrose 5 % 50 mL IVPB     2 g100 mL/hr over 30 Minutes Intravenous On call to O.R. 06/23/14 1409 06/24/14 1504      Assessment/Plan: S/p lap appy  1. Cont iv pain meds, add toradol today 2. pulm toilet 3. Telemetry due to afib history 4. Full liquids 5. Heparin, scds, protonoix  [facility] 06/25/2014

## 2014-06-26 ENCOUNTER — Other Ambulatory Visit: Payer: Self-pay

## 2014-06-26 ENCOUNTER — Encounter (HOSPITAL_COMMUNITY): Payer: Self-pay | Admitting: General Surgery

## 2014-06-26 DIAGNOSIS — R0602 Shortness of breath: Secondary | ICD-10-CM | POA: Diagnosis not present

## 2014-06-26 DIAGNOSIS — R079 Chest pain, unspecified: Secondary | ICD-10-CM | POA: Diagnosis not present

## 2014-06-26 DIAGNOSIS — I2510 Atherosclerotic heart disease of native coronary artery without angina pectoris: Secondary | ICD-10-CM | POA: Diagnosis not present

## 2014-06-26 DIAGNOSIS — K352 Acute appendicitis with generalized peritonitis: Secondary | ICD-10-CM | POA: Diagnosis not present

## 2014-06-26 DIAGNOSIS — Z951 Presence of aortocoronary bypass graft: Secondary | ICD-10-CM | POA: Diagnosis not present

## 2014-06-26 LAB — BASIC METABOLIC PANEL
Anion gap: 12 (ref 5–15)
BUN: 20 mg/dL (ref 6–23)
CHLORIDE: 105 meq/L (ref 96–112)
CO2: 23 mEq/L (ref 19–32)
CREATININE: 0.93 mg/dL (ref 0.50–1.35)
Calcium: 9.2 mg/dL (ref 8.4–10.5)
GFR calc Af Amer: >90 mL/min (ref >90)
GFR calc non Af Amer: 84 mL/min — ABNORMAL LOW (ref >90)
Glucose, Bld: 122 mg/dL — ABNORMAL HIGH (ref 70–99)
Potassium: 4.1 mEq/L (ref 3.7–5.3)
Sodium: 140 mEq/L (ref 137–147)

## 2014-06-26 MED ORDER — ROSUVASTATIN CALCIUM 5 MG PO TABS
5.0000 mg | ORAL_TABLET | Freq: Every day | ORAL | Status: DC
  Administered 2014-06-26: 5 mg via ORAL
  Filled 2014-06-26 (×3): qty 1

## 2014-06-26 MED ORDER — LISINOPRIL 20 MG PO TABS
20.0000 mg | ORAL_TABLET | Freq: Every day | ORAL | Status: DC
  Administered 2014-06-26 – 2014-06-27 (×2): 20 mg via ORAL
  Filled 2014-06-26 (×2): qty 1

## 2014-06-26 MED ORDER — ASPIRIN 81 MG PO CHEW
81.0000 mg | CHEWABLE_TABLET | Freq: Every day | ORAL | Status: DC
  Administered 2014-06-26 – 2014-06-27 (×2): 81 mg via ORAL
  Filled 2014-06-26 (×2): qty 1

## 2014-06-26 NOTE — Anesthesia Postprocedure Evaluation (Signed)
  Anesthesia Post-op Note  Patient: Perry [name]  Procedure(s) Performed: Procedure(s): APPENDECTOMY LAPAROSCOPIC  (N/A)  Patient Location: PACU  Anesthesia Type:General  Level of Consciousness: awake, alert  and oriented  Airway and Oxygen Therapy: Patient Spontanous Breathing and Patient connected to nasal cannula oxygen  Post-op Pain: mild  Post-op Assessment: Post-op Vital signs reviewed, Patient's Cardiovascular Status Stable, Respiratory Function Stable, Patent Airway, No signs of Nausea or vomiting and Pain level controlled  Post-op Vital Signs: stable  Last Vitals:  Filed Vitals:   06/26/14 0500  BP: 145/74  Pulse: 68  Temp: 36.6 C  Resp: 20    Complications: No apparent anesthesia complications

## 2014-06-26 NOTE — Consult Note (Signed)
Cardiology Consult Note  Admit date: 06/24/2014 Name: Perry [name] 68 y.o.  male DOB:  11-15-1945 MRN:  [id number]  Today's date:  06/26/2014  Referring Physician:   Dr. Serita Grammes  Reason for Consultation:   Shoulder pain and shortness of breath   IMPRESSIONS: 1.  Atypical shoulder pain and dyspnea that does not sound like angina or ischemic pain. 2.  Coronary artery disease with previous bypass grafting and inferior infarction. 3.  History of atrial fibrillation without recurrence during this admission. 4.  Hyperlipidemia. 5.  Obesity  RECOMMENDATION: I reviewed the EKG that shows no ischemic changes.  He has felt fine today and I would just restart his home medicines and do nothing further at this point unless he has recurrent problems.  HISTORY: This 68 year old male has a history of an inferior infarction and had bypass grafting a number of years ago.  He has had a recent complicated course following appendicitis with an infection and recently was admitted for removal of residual appendiceal tissue.  He had been doing well and I saw him preoperatively which point he was doing fine.  During his last hospitalization he had atrial fibrillation following an infection that then converted to sinus rhythm and he has not had recurrence of atrial fibrillation since then.  He has not been anticoagulated because of potential for surgery.  He had surgery yesterday and had some lower abdominal pain following surgery.  Yesterday afternoon he had some upper chest discomfort as well as some bilateral shoulder pain that improved with narcotic pain medication.  He was able to walk the halls without recurrent pain yesterday.  Today he is complaining of some mild bilateral shoulder pain as well as lower abdominal pain but does not have what sounds like angina.  I was asked to see him today.  Past Medical History  Diagnosis Date  . CAD (coronary artery disease)   . Hernia, inguinal     right  .  Hyperglycemia   . Hearing loss     mild  . Hypertension   . Old inferior wall myocardial infarction 2004  . Lumbar disc disease   . CAD (coronary artery disease) of artery bypass graft 05/04/2014  . Dyslipidemia 05/04/2014  . Acute appendicitis with perforation and peritoneal abscess 05/04/2014  . Ileus, postoperative 05/04/2014  . Abscess of abdominal cavity 05/04/2014  . A-fib 04/2014  . CHF (congestive heart failure)     pt not aware  . Diabetes mellitus     not on medication - pt not aware of this      Past Surgical History  Procedure Laterality Date  . Lumbar laminectomy  2001    lumbar  . Coronary artery bypass graft  2004    LIMA to LAD and SVG to RCA (emergent)  . Coronary angioplasty      multiple  . Laparoscopic appendectomy N/A 04/21/2014    Procedure: APPENDECTOMY LAPAROSCOPIC;  Surgeon: Rolm Bookbinder, MD;  Location: [place];  Service: General;  Laterality: N/A;  . Tonsillectomy    . Colonoscopy      Allergies:  is allergic to xanax.   Medications: Prior to Admission medications   Medication Sig Start Date End Date Taking? Authorizing Provider  aspirin 81 MG chewable tablet Chew 81 mg by mouth daily.   Yes Historical Provider, MD  bismuth subsalicylate (PEPTO BISMOL) 262 MG chewable tablet Chew 524 mg by mouth as needed for indigestion.   Yes Historical Provider, MD  fenofibrate (TRICOR) 145 MG  tablet Take 145 mg by mouth daily.     Yes Historical Provider, MD  fish oil-omega-3 fatty acids 1000 MG capsule Take 2 g by mouth daily.     Yes Historical Provider, MD  lisinopril (PRINIVIL,ZESTRIL) 40 MG tablet Take 1/2 tablet for now and follow up with your primary care doctor Patient taking differently: Take 20 mg by mouth daily.  05/04/14  Yes Earnstine Regal, PA-C  metoprolol (TOPROL-XL) 50 MG 24 hr tablet Take 25 mg by mouth daily.    Yes Historical Provider, MD  NON FORMULARY Taking a probiotic once a day   Yes Historical Provider, MD  rosuvastatin (CRESTOR) 5 MG  tablet Take 5 mg by mouth daily.     Yes Historical Provider, MD  acetaminophen (TYLENOL) 325 MG tablet Take 2 tablets (650 mg total) by mouth every 4 (four) hours as needed for fever (temp 101). 05/04/14   Earnstine Regal, PA-C  ciprofloxacin (CIPRO) 500 MG tablet Take 1 tablet (500 mg total) by mouth 2 (two) times daily. 05/15/14   Rolm Bookbinder, MD  metroNIDAZOLE (FLAGYL) 500 MG tablet Take 1 tablet (500 mg total) by mouth every 8 (eight) hours. 05/15/14   Rolm Bookbinder, MD  oxyCODONE-acetaminophen (PERCOCET/ROXICET) 5-325 MG per tablet You can take 1-2 every 4 hours.  This has acetaminophen in it.(Tylenol)  You cannot take more than 4000 mg of acetaminophen per day, and you have to include this in your total. 05/04/14   Earnstine Regal, PA-C   Family History: Family Status  Relation Status Death Age  . Mother Alive   . Father Deceased   . Sister Alive   . Brother Alive   . Brother Alive    Social History:   reports that he quit smoking about 35 years ago. His smoking use included Cigarettes. He smoked 0.00 packs per day. He has quit using smokeless tobacco. His smokeless tobacco use included Chew. He reports that he drinks about 4.2 oz of alcohol per week. He reports that he does not use illicit drugs.   History   Social History Narrative   Patient is married [name]) and lives at home with his wife.   Patient has two adult children.   Patient is working full-time.   Patient has a Master's degree.   Patient drinks one cup of coffee every morning.   Patient is right-handed.    Review of Systems: Otherwise negative except as noted above  Physical Exam: BP 145/74 mmHg  Pulse 68  Temp(Src) 97.8 F (36.6 C) (Oral)  Resp 20  Ht 6\' 1"  (1.854 m)  Wt 104.781 kg (231 lb)  BMI 30.48 kg/m2  SpO2 97%  General appearance: Pleasant white male in no acute distress Neck: no adenopathy, no carotid bruit, no JVD and supple, symmetrical, trachea midline Lungs: clear to auscultation  bilaterally Heart: regular rate and rhythm, S1, S2 normal, no murmur, click, rub or gallop Abdomen: Mild lower abdominal tenderness, good bowel sounds Rectal: deferred Extremities: extremities normal, atraumatic, no cyanosis or edema Pulses: 2+ and symmetric Neurologic: Grossly normal  Labs: CBC  Recent Labs  06/24/14 1340 06/25/14 0528  WBC 4.7 6.4  RBC 4.18* 3.58*  HGB 12.7* 10.8*  HCT 36.8* 32.5*  PLT 182 198  MCV 88.0 90.8  MCH 30.4 30.2  MCHC 34.5 33.2  RDW 13.4 14.0  LYMPHSABS 1.5  --   MONOABS 0.4  --   EOSABS 0.1  --   BASOSABS 0.0  --    CMP   Recent  Labs  06/26/14 0557  NA 140  K 4.1  CL 105  CO2 23  GLUCOSE 122*  BUN 20  CREATININE 0.93  CALCIUM 9.2  GFRNONAA 84*  GFRAA >90   EKG: Sinus rhythm with occasional PVCs, voltage for LVH, left axis deviation, no ischemic changes  Signed:  W. Doristine Church MD [facility]   Cardiology Consultant  06/26/2014, 1:00 PM

## 2014-06-26 NOTE — Progress Notes (Signed)
2 Days Post-Op  Subjective: No flatus, some left shoulder pain and upper chest, one episode of some sob while lying down hunched over none while walking  Objective: Vital signs in last 24 hours: Temp:  [97.8 F (36.6 C)-98 F (36.7 C)] 97.8 F (36.6 C) (11/20 0500) Pulse Rate:  [64-77] 68 (11/20 0500) Resp:  [18-20] 20 (11/20 0500) BP: (121-145)/(67-77) 145/74 mmHg (11/20 0500) SpO2:  [96 %-100 %] 97 % (11/20 0500) Last BM Date: 06/24/14  Intake/Output from previous day: 11/19 0701 - 11/20 0700 In: 2821 [P.O.:600; I.V.:2221] Out: 1325 [Urine:1325] Intake/Output this shift: Total I/O In: -  Out: 600 [Urine:600]  General appearance: no distress Resp: clear to auscultation bilaterally Cardio: regular rate and rhythm GI: soft bs present nontender incision clean  Lab Results:   Recent Labs  06/24/14 1340 06/25/14 0528  WBC 4.7 6.4  HGB 12.7* 10.8*  HCT 36.8* 32.5*  PLT 182 198   BMET  Recent Labs  06/25/14 0528 06/26/14 0557  NA 141 140  K 4.3 4.1  CL 107 105  CO2 25 23  GLUCOSE 111* 122*  BUN 25* 20  CREATININE 0.93 0.93  CALCIUM 8.9 9.2    Anti-infectives: Anti-infectives    Start     Dose/Rate Route Frequency Ordered Stop   06/24/14 2000  cefOXitin (MEFOXIN) 2 g in dextrose 5 % 50 mL IVPB     2 g100 mL/hr over 30 Minutes Intravenous 4 times per day 06/24/14 1829 06/25/14 0624   06/24/14 0600  cefOXitin (MEFOXIN) 2 g in dextrose 5 % 50 mL IVPB     2 g100 mL/hr over 30 Minutes Intravenous On call to O.R. 06/23/14 1409 06/24/14 1504      Assessment/Plan: POD 2 lap appy  1. Cont oral pain meds with iv backup , toradol prn today 2. pulm toilet 3. Telemetry due to afib history, I think some of his chest pain is ms, I discussed with Dr Wynonia Lawman and he will see him today 4. Full liquids 5. Heparin, scds, protonix 6. Once passes gas then will advance diet and home following day  [facility] 06/26/2014

## 2014-06-26 NOTE — Progress Notes (Signed)
UR completed 

## 2014-06-27 DIAGNOSIS — K352 Acute appendicitis with generalized peritonitis: Secondary | ICD-10-CM | POA: Diagnosis not present

## 2014-06-27 DIAGNOSIS — R079 Chest pain, unspecified: Secondary | ICD-10-CM | POA: Diagnosis not present

## 2014-06-27 MED ORDER — PANTOPRAZOLE SODIUM 40 MG PO TBEC: 40.0000 mg | DELAYED_RELEASE_TABLET | Freq: Every day | ORAL | Status: DC

## 2014-06-27 NOTE — Progress Notes (Signed)
Utilization Review completed.  

## 2014-06-27 NOTE — Plan of Care (Signed)
Problem: Phase III Progression Outcomes Goal: Voiding independently Outcome: Completed/Met Date Met:  06/27/14 Goal: Discharge plan remains appropriate-arrangements made Outcome: Completed/Met Date Met:  06/27/14

## 2014-06-27 NOTE — Progress Notes (Signed)
Subjective:  Minimal shoulder pain toady with walking  Objective:  Vital Signs in the last 24 hours: BP 134/77 mmHg  Pulse 60  Temp(Src) 97.9 F (36.6 C) (Oral)  Resp 16  Ht 6\' 1"  (1.854 m)  Wt 104.781 kg (231 lb)  BMI 30.48 kg/m2  SpO2 99%  Physical Exam: Pleasant WM in NAD Lungs:  Clear  Cardiac:  Regular rhythm, normal S1 and S2, no S3  Intake/Output from previous day: 11/20 0701 - 11/21 0700 In: 1110 [P.O.:1080; I.V.:30] Out: 2780 [Urine:2780] Weight Filed Weights   06/24/14 1343  Weight: 104.781 kg (231 lb)    Lab Results: Basic Metabolic Panel:  Recent Labs  06/25/14 0528 06/26/14 0557  NA 141 140  K 4.3 4.1  CL 107 105  CO2 25 23  GLUCOSE 111* 122*  BUN 25* 20  CREATININE 0.93 0.93    CBC:  Recent Labs  06/24/14 1340 06/25/14 0528  WBC 4.7 6.4  NEUTROABS 2.7  --   HGB 12.7* 10.8*  HCT 36.8* 32.5*  MCV 88.0 90.8  PLT 182 198    BNP    Component Value Date/Time   PROBNP 64.0 03/21/2009 2355    PROTIME: Lab Results  Component Value Date   INR 1.48 04/27/2014   Assessment/Plan:  1. Atypical chest pain 2. Recent appendectomy 3. CAD 4. Recent a fib  Rec:  OK to go home.  Not currently with chest pain.  I will see in 2-3 weeks.  To call if pain continues.      Kerry Hough  MD [facility]  06/27/2014, 10:54 AM

## 2014-06-27 NOTE — Discharge Instructions (Signed)
[facility] ______[facility] °LAPAROSCOPIC SURGERY: POST OP INSTRUCTIONS °Always review your discharge instruction sheet given to you by the facility where your surgery was performed. °IF YOU HAVE DISABILITY OR FAMILY LEAVE FORMS, YOU MUST BRING THEM TO THE OFFICE FOR PROCESSING.   °DO NOT GIVE THEM TO YOUR DOCTOR. ° °1. A prescription for pain medication may be given to you upon discharge.  Take your pain medication as prescribed, if needed.  If narcotic pain medicine is not needed, then you may take acetaminophen (Tylenol) or ibuprofen (Advil) as needed. °2. Take your usually prescribed medications unless otherwise directed. °3. If you need a refill on your pain medication, please contact your pharmacy.  They will contact our office to request authorization. Prescriptions will not be filled after 5pm or on week-ends. °4. You should follow a light diet the first few days after arrival home, such as soup and crackers, etc.  Be sure to include lots of fluids daily. °5. Most patients will experience some swelling and bruising in the area of the incisions.  Ice packs will help.  Swelling and bruising can take several days to resolve.  °6. It is common to experience some constipation if taking pain medication after surgery.  Increasing fluid intake and taking a stool softener (such as Colace) will usually help or prevent this problem from occurring.  A mild laxative (Milk of Magnesia or Miralax) should be taken according to package instructions if there are no bowel movements after 48 hours. °7. Unless discharge instructions indicate otherwise, you may remove your bandages 24-48 hours after surgery, and you may shower at that time.  You may have steri-strips (small skin tapes) in place directly over the incision.  These strips should be left on the skin for 7-10 days.  If your surgeon used skin glue on the incision, you may shower in 24 hours.  The glue will flake off over the next 2-3 weeks.  Any sutures or  staples will be removed at the office during your follow-up visit. °8. ACTIVITIES:  You may resume regular (light) daily activities beginning the next day--such as daily self-care, walking, climbing stairs--gradually increasing activities as tolerated.  You may have sexual intercourse when it is comfortable.  Refrain from any heavy lifting or straining until approved by your doctor. °a. You may drive when you are no longer taking prescription pain medication, you can comfortably wear a seatbelt, and you can safely maneuver your car and apply brakes. °b. RETURN TO WORK:  __________________________________________________________ °9. You should see your doctor in the office for a follow-up appointment approximately 2-3 weeks after your surgery.  Make sure that you call for this appointment within a day or two after you arrive home to insure a convenient appointment time. °10. OTHER INSTRUCTIONS: __________________________________________________________________________________________________________________________ __________________________________________________________________________________________________________________________ °WHEN TO CALL YOUR DOCTOR: °1. Fever over 101.0 °2. Inability to urinate °3. Continued bleeding from incision. °4. Increased pain, redness, or drainage from the incision. °5. Increasing abdominal pain ° °The clinic staff is available to answer your questions during regular business hours.  Please don’t hesitate to call and ask to speak to one of the nurses for clinical concerns.  If you have a medical emergency, go to the nearest emergency room or call 911.  A surgeon from [facility] is always on call at the hospital. °[address], [address], [place], [place]  [address] ? [address] [address], [place], [place]   [address] °[phone] ? [phone] ? FAX [phone] °Web site:   [email or web address] °

## 2014-06-27 NOTE — Plan of Care (Signed)
Problem: Phase I Progression Outcomes Goal: Pain controlled with appropriate interventions Outcome: Completed/Met Date Met:  06/27/14 Goal: OOB as tolerated unless otherwise ordered Outcome: Completed/Met Date Met:  06/27/14 Goal: Incision/dressings dry and intact Outcome: Completed/Met Date Met:  06/27/14 Goal: Sutures/staples intact Outcome: Completed/Met Date Met:  06/27/14 Goal: Tubes/drains patent Outcome: Not Applicable Date Met:  62/95/28 Goal: Initial discharge plan identified Outcome: Completed/Met Date Met:  06/27/14 Goal: Voiding-avoid urinary catheter unless indicated Outcome: Progressing Goal: Vital signs/hemodynamically stable Outcome: Progressing  Problem: Phase III Progression Outcomes Goal: Pain controlled on oral analgesia Outcome: Completed/Met Date Met:  06/27/14 Goal: Activity at appropriate level-compared to baseline (UP IN CHAIR FOR HEMODIALYSIS)  Outcome: Completed/Met Date Met:  06/27/14 Goal: Voiding independently Outcome: Progressing Goal: IV changed to normal saline lock Outcome: Completed/Met Date Met:  06/27/14 Goal: Nasogastric tube discontinued Outcome: Completed/Met Date Met:  06/27/14 Goal: Demonstrates TCDB, IS independently Outcome: Not Applicable Date Met:  41/32/44 Goal: Other Phase III Outcomes/Goals Outcome: Completed/Met Date Met:  06/27/14

## 2014-06-27 NOTE — Progress Notes (Signed)
[name] to be D/C'd Home per MD order.  Discussed with the patient and all questions fully answered.  VSS. Surgical incision site clean, dry, intact with no sign of infection.   IV catheter discontinued intact. Site without signs and symptoms of complications. Dressing and pressure applied.  An After Visit Summary was printed and given to the patient.  D/c education completed with patient/family including follow up instructions, medication list, d/c activities limitations if indicated, with other d/c instructions as indicated by MD - patient able to verbalize understanding, all questions fully answered.   Patient instructed to return to ED, call 911, or call MD for any changes in condition.   Patient escorted via [place], and D/C home via private auto.  Kathrine Cords D 06/27/2014 11:01 AM

## 2014-06-27 NOTE — Discharge Summary (Signed)
Patient ID: Perry [name] MRN: [id number] DOB/AGE: 1946/04/23 68 y.o.  Admit date: 06/24/2014 Discharge date: 06/27/2014  Procedures: laparoscopic appendectomy  Consults: cardiology  Reason for Admission: 84 yom who had perforated appendicitis not seen on initial ct that underwent attempt at lap appy complicated by leak and abscess. He has residual appendix left. This has all healed and he is doing well now. We discussed proceeding to or for lap appy possible open. He is having bms, no fevers, eating well and is back to his normal self. He did have postop afib last time.  Admission Diagnoses:  1. Residual appendix with fecaliths  Hospital Course: The patient was admitted and taken to the OR where he underwent a lap appy to remove the residual appendix.  The patient tolerated this well.  He developed a mild postoperative ileus and his diet was advanced slowly.  On POD 2, he did have some chest pain that cardiology saw him for and felt was not cardiac in nature.  EKG was normal. No further work up was needed.  His diet was advanced to solid food.  On POD 3, the patient was mobilizing a mile and a half a day and tolerating a solid diet with no pain.  He was felt stable for dc home.  PE: Abd: soft, NT, ND, +BS, incisions c/d/i with steri-strips in place Heart: regular with few PVCs  Discharge Diagnoses:  Active Problems:   Appendicitis s/p lap appy Patient Active Problem List   Diagnosis Date Noted  . Appendicitis 06/24/2014  . CAD (coronary artery disease) of artery bypass graft 05/04/2014  . Dyslipidemia 05/04/2014  . Acute appendicitis with perforation and peritoneal abscess 05/04/2014  . Ileus, postoperative 05/04/2014  . Abscess of abdominal cavity 05/04/2014  . Atrial fibrillation 04/28/2014  . Hyperlipidemia   . Sleep apnea   . CAD (coronary artery disease)   . Hearing loss   . Diabetes mellitus      Discharge Medications:   Medication List    STOP taking these  medications        ciprofloxacin 500 MG tablet  Commonly known as:  CIPRO     metroNIDAZOLE 500 MG tablet  Commonly known as:  FLAGYL      TAKE these medications        acetaminophen 325 MG tablet  Commonly known as:  TYLENOL  Take 2 tablets (650 mg total) by mouth every 4 (four) hours as needed for fever (temp 101).     aspirin 81 MG chewable tablet  Chew 81 mg by mouth daily.     bismuth subsalicylate 409 MG chewable tablet  Commonly known as:  PEPTO BISMOL  Chew 524 mg by mouth as needed for indigestion.     fenofibrate 145 MG tablet  Commonly known as:  TRICOR  Take 145 mg by mouth daily.     fish oil-omega-3 fatty acids 1000 MG capsule  Take 2 g by mouth daily.     lisinopril 40 MG tablet  Commonly known as:  PRINIVIL,ZESTRIL  Take 1/2 tablet for now and follow up with your primary care doctor     metoprolol succinate 50 MG 24 hr tablet  Commonly known as:  TOPROL-XL  Take 25 mg by mouth daily.     NON FORMULARY  Taking a probiotic once a day     oxyCODONE-acetaminophen 5-325 MG per tablet  Commonly known as:  PERCOCET/ROXICET  You can take 1-2 every 4 hours.  This has acetaminophen in it.(Tylenol)  You cannot take more than 4000 mg of acetaminophen per day, and you have to include this in your total.     rosuvastatin 5 MG tablet  Commonly known as:  CRESTOR  Take 5 mg by mouth daily.        Discharge Instructions:     Follow-up Information    Follow up with [facility], [place]. Schedule an appointment as soon as possible for a visit in 3 weeks.   Specialty:  General Surgery   Why:  our office will call you   Contact information:   [address] [place] [address] [phone]       Signed: KHAIRI, GARMAN 06/27/2014, 10:58 AM

## 2014-07-07 ENCOUNTER — Telehealth (INDEPENDENT_AMBULATORY_CARE_PROVIDER_SITE_OTHER): Payer: Self-pay

## 2014-07-07 ENCOUNTER — Telehealth: Payer: Self-pay | Admitting: *Deleted

## 2014-07-07 NOTE — Telephone Encounter (Signed)
Received notification from Nellie that this patient returned his CPAP equipment and signed a "Discontinuing therapy against medical advice" form.  Patient was not compliant to CPAP therapy.

## 2014-07-07 NOTE — Telephone Encounter (Signed)
Pt is post op 2nd appendectomy by Dr. Donne Hazel on 06/24/14.  He calls today stating that he is having increasing pain on his right side.  "It hurts when he breathes."  No fever, chills, drainage from incisions, and bowels moving fine.  Pt may need to cut back a little on activities to see if the pain eases.  If any other symptoms occur pt advised to call us back.

## 2014-07-07 NOTE — Telephone Encounter (Signed)
FYI from andy foster .

## 2014-07-14 ENCOUNTER — Emergency Department (HOSPITAL_COMMUNITY)
Admission: EM | Admit: 2014-07-14 | Discharge: 2014-07-14 | Disposition: A | Discharge status: Home / Self Care | Payer: Medicare Other | Attending: Emergency Medicine | Admitting: Emergency Medicine

## 2014-07-14 ENCOUNTER — Encounter (HOSPITAL_COMMUNITY): Payer: Self-pay | Admitting: Emergency Medicine

## 2014-07-14 ENCOUNTER — Emergency Department (HOSPITAL_COMMUNITY): Payer: Medicare Other

## 2014-07-14 DIAGNOSIS — E785 Hyperlipidemia, unspecified: Secondary | ICD-10-CM | POA: Diagnosis not present

## 2014-07-14 DIAGNOSIS — Z8739 Personal history of other diseases of the musculoskeletal system and connective tissue: Secondary | ICD-10-CM | POA: Insufficient documentation

## 2014-07-14 DIAGNOSIS — S3991XA Unspecified injury of abdomen, initial encounter: Secondary | ICD-10-CM | POA: Diagnosis not present

## 2014-07-14 DIAGNOSIS — S2232XA Fracture of one rib, left side, initial encounter for closed fracture: Secondary | ICD-10-CM | POA: Diagnosis not present

## 2014-07-14 DIAGNOSIS — S2242XA Multiple fractures of ribs, left side, initial encounter for closed fracture: Secondary | ICD-10-CM | POA: Diagnosis not present

## 2014-07-14 DIAGNOSIS — Z8719 Personal history of other diseases of the digestive system: Secondary | ICD-10-CM | POA: Insufficient documentation

## 2014-07-14 DIAGNOSIS — E119 Type 2 diabetes mellitus without complications: Secondary | ICD-10-CM | POA: Diagnosis not present

## 2014-07-14 DIAGNOSIS — Z7982 Long term (current) use of aspirin: Secondary | ICD-10-CM | POA: Diagnosis not present

## 2014-07-14 DIAGNOSIS — Y9389 Activity, other specified: Secondary | ICD-10-CM | POA: Diagnosis not present

## 2014-07-14 DIAGNOSIS — Z951 Presence of aortocoronary bypass graft: Secondary | ICD-10-CM | POA: Diagnosis not present

## 2014-07-14 DIAGNOSIS — Z955 Presence of coronary angioplasty implant and graft: Secondary | ICD-10-CM | POA: Insufficient documentation

## 2014-07-14 DIAGNOSIS — Y9289 Other specified places as the place of occurrence of the external cause: Secondary | ICD-10-CM | POA: Diagnosis not present

## 2014-07-14 DIAGNOSIS — I252 Old myocardial infarction: Secondary | ICD-10-CM | POA: Diagnosis not present

## 2014-07-14 DIAGNOSIS — Z87891 Personal history of nicotine dependence: Secondary | ICD-10-CM | POA: Insufficient documentation

## 2014-07-14 DIAGNOSIS — Y998 Other external cause status: Secondary | ICD-10-CM | POA: Insufficient documentation

## 2014-07-14 DIAGNOSIS — I2510 Atherosclerotic heart disease of native coronary artery without angina pectoris: Secondary | ICD-10-CM | POA: Insufficient documentation

## 2014-07-14 DIAGNOSIS — I509 Heart failure, unspecified: Secondary | ICD-10-CM | POA: Diagnosis not present

## 2014-07-14 DIAGNOSIS — S99921A Unspecified injury of right foot, initial encounter: Secondary | ICD-10-CM | POA: Diagnosis not present

## 2014-07-14 DIAGNOSIS — R0789 Other chest pain: Secondary | ICD-10-CM | POA: Diagnosis not present

## 2014-07-14 DIAGNOSIS — I10 Essential (primary) hypertension: Secondary | ICD-10-CM | POA: Diagnosis not present

## 2014-07-14 DIAGNOSIS — Z79899 Other long term (current) drug therapy: Secondary | ICD-10-CM | POA: Diagnosis not present

## 2014-07-14 DIAGNOSIS — H9190 Unspecified hearing loss, unspecified ear: Secondary | ICD-10-CM | POA: Insufficient documentation

## 2014-07-14 DIAGNOSIS — M79661 Pain in right lower leg: Secondary | ICD-10-CM | POA: Diagnosis not present

## 2014-07-14 DIAGNOSIS — S29001A Unspecified injury of muscle and tendon of front wall of thorax, initial encounter: Secondary | ICD-10-CM | POA: Diagnosis present

## 2014-07-14 LAB — CBC WITH DIFFERENTIAL/PLATELET
BASOS ABS: 0 10*3/uL (ref 0.0–0.1)
BASOS PCT: 0 % (ref 0–1)
EOS ABS: 0.4 10*3/uL (ref 0.0–0.7)
Eosinophils Relative: 3 % (ref 0–5)
HEMATOCRIT: 35.4 % — AB (ref 39.0–52.0)
Hemoglobin: 11.9 g/dL — ABNORMAL LOW (ref 13.0–17.0)
Lymphocytes Relative: 10 % — ABNORMAL LOW (ref 12–46)
Lymphs Abs: 1.2 10*3/uL (ref 0.7–4.0)
MCH: 31.2 pg (ref 26.0–34.0)
MCHC: 33.6 g/dL (ref 30.0–36.0)
MCV: 92.9 fL (ref 78.0–100.0)
MONO ABS: 0.7 10*3/uL (ref 0.1–1.0)
Monocytes Relative: 6 % (ref 3–12)
NEUTROS ABS: 9.6 10*3/uL — AB (ref 1.7–7.7)
NEUTROS PCT: 81 % — AB (ref 43–77)
Platelets: 274 10*3/uL (ref 150–400)
RBC: 3.81 MIL/uL — ABNORMAL LOW (ref 4.22–5.81)
RDW: 14 % (ref 11.5–15.5)
WBC: 12 10*3/uL — ABNORMAL HIGH (ref 4.0–10.5)

## 2014-07-14 LAB — BASIC METABOLIC PANEL
ANION GAP: 14 (ref 5–15)
BUN: 24 mg/dL — ABNORMAL HIGH (ref 6–23)
CHLORIDE: 105 meq/L (ref 96–112)
CO2: 23 mEq/L (ref 19–32)
Calcium: 10.1 mg/dL (ref 8.4–10.5)
Creatinine, Ser: 0.82 mg/dL (ref 0.50–1.35)
GFR calc non Af Amer: 89 mL/min — ABNORMAL LOW (ref >90)
Glucose, Bld: 114 mg/dL — ABNORMAL HIGH (ref 70–99)
Potassium: 3.9 mEq/L (ref 3.7–5.3)
Sodium: 142 mEq/L (ref 137–147)

## 2014-07-14 MED ORDER — OXYCODONE-ACETAMINOPHEN 5-325 MG PO TABS
1.0000 | ORAL_TABLET | Freq: Four times a day (QID) | ORAL | Status: DC | PRN
Start: 2014-07-14 — End: 2015-01-05

## 2014-07-14 MED ORDER — MORPHINE SULFATE 4 MG/ML IJ SOLN: 4.0000 mg | Freq: Once | INTRAMUSCULAR | Status: DC

## 2014-07-14 MED ORDER — HYDROMORPHONE HCL 1 MG/ML IJ SOLN
1.0000 mg | Freq: Once | INTRAMUSCULAR | Status: AC
  Administered 2014-07-14: 1 mg via INTRAVENOUS
  Filled 2014-07-14: qty 1

## 2014-07-14 MED ORDER — MORPHINE SULFATE 2 MG/ML IJ SOLN
INTRAMUSCULAR | Status: AC
  Administered 2014-07-14: 4 mg via INTRAVENOUS
  Filled 2014-07-14: qty 1

## 2014-07-14 MED ORDER — IOHEXOL 300 MG/ML  SOLN
100.0000 mL | Freq: Once | INTRAMUSCULAR | Status: AC | PRN
  Administered 2014-07-14: 100 mL via INTRAVENOUS

## 2014-07-14 MED ORDER — SODIUM CHLORIDE 0.9 % IV BOLUS (SEPSIS)
1000.0000 mL | Freq: Once | INTRAVENOUS | Status: AC
  Administered 2014-07-14: 1000 mL via INTRAVENOUS

## 2014-07-14 MED ORDER — LIDOCAINE 5 % EX PTCH: 1.0000 | MEDICATED_PATCH | CUTANEOUS | Status: DC

## 2014-07-14 MED ORDER — MORPHINE SULFATE 2 MG/ML IJ SOLN
INTRAMUSCULAR | Status: AC
  Filled 2014-07-14: qty 1

## 2014-07-14 NOTE — Discharge Instructions (Signed)
Percocet as prescribed as needed for pain.  Return to the emergency department if you develop worsening pain, difficulty breathing, productive cough with fever, or other new and concerning symptoms.   Rib Fracture A rib fracture is a break or crack in one of the bones of the ribs. The ribs are a group of long, curved bones that wrap around your chest and attach to your spine. They protect your lungs and other organs in the chest cavity. A broken or cracked rib is often painful, but most do not cause other problems. Most rib fractures heal on their own over time. However, rib fractures can be more serious if multiple ribs are broken or if broken ribs move out of place and push against other structures. CAUSES   A direct blow to the chest. For example, this could happen during contact sports, a car accident, or a fall against a hard object.  Repetitive movements with high force, such as pitching a baseball or having severe coughing spells. SYMPTOMS   Pain when you breathe in or cough.  Pain when someone presses on the injured area. DIAGNOSIS  Your caregiver will perform a physical exam. Various imaging tests may be ordered to confirm the diagnosis and to look for related injuries. These tests may include a chest X-ray, computed tomography (CT), magnetic resonance imaging (MRI), or a bone scan. TREATMENT  Rib fractures usually heal on their own in 1-3 months. The longer healing period is often associated with a continued cough or other aggravating activities. During the healing period, pain control is very important. Medication is usually given to control pain. Hospitalization or surgery may be needed for more severe injuries, such as those in which multiple ribs are broken or the ribs have moved out of place.  HOME CARE INSTRUCTIONS   Avoid strenuous activity and any activities or movements that cause pain. Be careful during activities and avoid bumping the injured rib.  Gradually increase  activity as directed by your caregiver.  Only take over-the-counter or prescription medications as directed by your caregiver. Do not take other medications without asking your caregiver first.  Apply ice to the injured area for the first 1-2 days after you have been treated or as directed by your caregiver. Applying ice helps to reduce inflammation and pain.  Put ice in a plastic bag.  Place a towel between your skin and the bag.   Leave the ice on for 15-20 minutes at a time, every 2 hours while you are awake.  Perform deep breathing as directed by your caregiver. This will help prevent pneumonia, which is a common complication of a broken rib. Your caregiver may instruct you to:  Take deep breaths several times a day.  Try to cough several times a day, holding a pillow against the injured area.  Use a device called an incentive spirometer to practice deep breathing several times a day.  Drink enough fluids to keep your urine clear or pale yellow. This will help you avoid constipation.   Do not wear a rib belt or binder. These restrict breathing, which can lead to pneumonia.  SEEK IMMEDIATE MEDICAL CARE IF:   You have a fever.   You have difficulty breathing or shortness of breath.   You develop a continual cough, or you cough up thick or bloody sputum.  You feel sick to your stomach (nausea), throw up (vomit), or have abdominal pain.   You have worsening pain not controlled with medications.  MAKE SURE YOU:  Understand these instructions.  Will watch your condition.  Will get help right away if you are not doing well or get worse. Document Released: 07/24/2005 Document Revised: 03/26/2013 Document Reviewed: 09/25/2012 [facility] Patient Information 2015 [place], [place]. This information is not intended to replace advice given to you by your health care provider. Make sure you discuss any questions you have with your health care provider.

## 2014-07-14 NOTE — ED Notes (Signed)
Patient transported to CT 
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
## 2014-07-14 NOTE — ED Notes (Signed)
Pt has returned from being out of the department; pt placed back on monitor, continuous pulse oximetry and blood pressure cuff; family at bedside; gave pt 2 more blankets

## 2014-07-14 NOTE — ED Notes (Signed)
Pt undressed, in gown, on monitor, continuous pulse oximetry and blood pressure cuff; warm blankets given
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
## 2014-07-17 ENCOUNTER — Telehealth (INDEPENDENT_AMBULATORY_CARE_PROVIDER_SITE_OTHER): Payer: Self-pay

## 2014-07-17 ENCOUNTER — Ambulatory Visit
Admission: RE | Admit: 2014-07-17 | Discharge: 2014-07-17 | Disposition: A | Discharge status: Home / Self Care | Payer: Medicare Other | Source: Ambulatory Visit | Attending: General Surgery | Admitting: General Surgery

## 2014-07-17 ENCOUNTER — Other Ambulatory Visit (INDEPENDENT_AMBULATORY_CARE_PROVIDER_SITE_OTHER): Payer: Self-pay | Admitting: General Surgery

## 2014-07-17 DIAGNOSIS — M79642 Pain in left hand: Secondary | ICD-10-CM

## 2014-07-17 DIAGNOSIS — R0781 Pleurodynia: Secondary | ICD-10-CM

## 2014-07-17 DIAGNOSIS — S62667A Nondisplaced fracture of distal phalanx of left little finger, initial encounter for closed fracture: Secondary | ICD-10-CM | POA: Diagnosis not present

## 2014-07-17 DIAGNOSIS — S2232XA Fracture of one rib, left side, initial encounter for closed fracture: Secondary | ICD-10-CM | POA: Diagnosis not present

## 2014-07-17 NOTE — Telephone Encounter (Signed)
Pt seen by Dr Donne Hazel today and orders placed in epic for STAT cxr/lt hand. Will call pt with results today.

## 2014-07-22 DIAGNOSIS — S62667A Nondisplaced fracture of distal phalanx of left little finger, initial encounter for closed fracture: Secondary | ICD-10-CM | POA: Diagnosis not present

## 2014-08-03 DIAGNOSIS — S62667D Nondisplaced fracture of distal phalanx of left little finger, subsequent encounter for fracture with routine healing: Secondary | ICD-10-CM | POA: Diagnosis not present

## 2014-08-12 DIAGNOSIS — S62667D Nondisplaced fracture of distal phalanx of left little finger, subsequent encounter for fracture with routine healing: Secondary | ICD-10-CM | POA: Diagnosis not present

## 2014-08-26 DIAGNOSIS — S62667D Nondisplaced fracture of distal phalanx of left little finger, subsequent encounter for fracture with routine healing: Secondary | ICD-10-CM | POA: Diagnosis not present

## 2014-09-07 DIAGNOSIS — I480 Paroxysmal atrial fibrillation: Secondary | ICD-10-CM | POA: Diagnosis not present

## 2014-09-07 DIAGNOSIS — Z951 Presence of aortocoronary bypass graft: Secondary | ICD-10-CM | POA: Diagnosis not present

## 2014-09-07 DIAGNOSIS — I252 Old myocardial infarction: Secondary | ICD-10-CM | POA: Diagnosis not present

## 2014-09-07 DIAGNOSIS — I493 Ventricular premature depolarization: Secondary | ICD-10-CM | POA: Diagnosis not present

## 2014-09-07 DIAGNOSIS — E668 Other obesity: Secondary | ICD-10-CM | POA: Diagnosis not present

## 2014-09-07 DIAGNOSIS — I10 Essential (primary) hypertension: Secondary | ICD-10-CM | POA: Diagnosis not present

## 2014-09-07 DIAGNOSIS — E784 Other hyperlipidemia: Secondary | ICD-10-CM | POA: Diagnosis not present

## 2014-09-07 DIAGNOSIS — I2510 Atherosclerotic heart disease of native coronary artery without angina pectoris: Secondary | ICD-10-CM | POA: Diagnosis not present

## 2014-09-30 DIAGNOSIS — S62667D Nondisplaced fracture of distal phalanx of left little finger, subsequent encounter for fracture with routine healing: Secondary | ICD-10-CM | POA: Diagnosis not present

## 2015-01-05 ENCOUNTER — Encounter: Payer: Self-pay | Admitting: Emergency Medicine

## 2015-01-05 ENCOUNTER — Ambulatory Visit (INDEPENDENT_AMBULATORY_CARE_PROVIDER_SITE_OTHER): Payer: Medicare Other | Admitting: Emergency Medicine

## 2015-01-05 ENCOUNTER — Ambulatory Visit (INDEPENDENT_AMBULATORY_CARE_PROVIDER_SITE_OTHER): Payer: Medicare Other

## 2015-01-05 VITALS — BP 140/78 | HR 56 | Temp 97.8°F | Resp 16 | Ht 71.25 in | Wt 244.0 lb

## 2015-01-05 DIAGNOSIS — E119 Type 2 diabetes mellitus without complications: Secondary | ICD-10-CM | POA: Diagnosis not present

## 2015-01-05 DIAGNOSIS — Z1211 Encounter for screening for malignant neoplasm of colon: Secondary | ICD-10-CM | POA: Diagnosis not present

## 2015-01-05 DIAGNOSIS — Z0000 Encounter for general adult medical examination without abnormal findings: Secondary | ICD-10-CM | POA: Diagnosis not present

## 2015-01-05 DIAGNOSIS — I25700 Atherosclerosis of coronary artery bypass graft(s), unspecified, with unstable angina pectoris: Secondary | ICD-10-CM

## 2015-01-05 DIAGNOSIS — M25512 Pain in left shoulder: Secondary | ICD-10-CM | POA: Diagnosis not present

## 2015-01-05 DIAGNOSIS — Z23 Encounter for immunization: Secondary | ICD-10-CM

## 2015-01-05 DIAGNOSIS — Z125 Encounter for screening for malignant neoplasm of prostate: Secondary | ICD-10-CM

## 2015-01-05 DIAGNOSIS — E782 Mixed hyperlipidemia: Secondary | ICD-10-CM | POA: Diagnosis not present

## 2015-01-05 DIAGNOSIS — N4289 Other specified disorders of prostate: Secondary | ICD-10-CM

## 2015-01-05 DIAGNOSIS — N400 Enlarged prostate without lower urinary tract symptoms: Secondary | ICD-10-CM

## 2015-01-05 LAB — POCT URINALYSIS DIPSTICK
BILIRUBIN UA: NEGATIVE
Blood, UA: NEGATIVE
Glucose, UA: NEGATIVE
Ketones, UA: NEGATIVE
Leukocytes, UA: NEGATIVE
Nitrite, UA: NEGATIVE
Protein, UA: NEGATIVE
Spec Grav, UA: >=1.03
Urobilinogen, UA: 0.2
pH, UA: 5

## 2015-01-05 LAB — COMPREHENSIVE METABOLIC PANEL
ALT: 40 U/L (ref 0–53)
AST: 34 U/L (ref 0–37)
Albumin: 4.1 g/dL (ref 3.5–5.2)
Alkaline Phosphatase: 60 U/L (ref 39–117)
BUN: 28 mg/dL — ABNORMAL HIGH (ref 6–23)
CALCIUM: 9.8 mg/dL (ref 8.4–10.5)
CHLORIDE: 106 meq/L (ref 96–112)
CO2: 24 meq/L (ref 19–32)
Creat: 0.93 mg/dL (ref 0.50–1.35)
GLUCOSE: 114 mg/dL — AB (ref 70–99)
POTASSIUM: 4.1 meq/L (ref 3.5–5.3)
SODIUM: 140 meq/L (ref 135–145)
Total Bilirubin: 0.5 mg/dL (ref 0.2–1.2)
Total Protein: 6.7 g/dL (ref 6.0–8.3)

## 2015-01-05 LAB — CBC WITH DIFFERENTIAL/PLATELET
BASOS ABS: 0 10*3/uL (ref 0.0–0.1)
BASOS PCT: 0 % (ref 0–1)
EOS ABS: 0.2 10*3/uL (ref 0.0–0.7)
EOS PCT: 3 % (ref 0–5)
HEMATOCRIT: 39.8 % (ref 39.0–52.0)
Hemoglobin: 13.6 g/dL (ref 13.0–17.0)
LYMPHS ABS: 1.7 10*3/uL (ref 0.7–4.0)
Lymphocytes Relative: 32 % (ref 12–46)
MCH: 31.6 pg (ref 26.0–34.0)
MCHC: 34.2 g/dL (ref 30.0–36.0)
MCV: 92.3 fL (ref 78.0–100.0)
MONO ABS: 0.6 10*3/uL (ref 0.1–1.0)
MPV: 10.3 fL (ref 8.6–12.4)
Monocytes Relative: 12 % (ref 3–12)
Neutro Abs: 2.8 10*3/uL (ref 1.7–7.7)
Neutrophils Relative %: 53 % (ref 43–77)
Platelets: 189 10*3/uL (ref 150–400)
RBC: 4.31 MIL/uL (ref 4.22–5.81)
RDW: 14 % (ref 11.5–15.5)
WBC: 5.2 10*3/uL (ref 4.0–10.5)

## 2015-01-05 LAB — POCT UA - MICROSCOPIC ONLY
BACTERIA, U MICROSCOPIC: NEGATIVE
Epithelial cells, urine per micros: NEGATIVE
MUCUS UA: NEGATIVE
RBC, urine, microscopic: NEGATIVE

## 2015-01-05 LAB — POCT GLYCOSYLATED HEMOGLOBIN (HGB A1C): Hemoglobin A1C: 6.3

## 2015-01-05 MED ORDER — ZOSTER VACCINE LIVE 19400 UNT/0.65ML ~~LOC~~ SOLR: 0.6500 mL | Freq: Once | SUBCUTANEOUS | Status: DC

## 2015-01-05 MED ORDER — DICLOFENAC SODIUM 1 % TD GEL: 2.0000 g | Freq: Three times a day (TID) | TRANSDERMAL | Status: DC

## 2015-01-05 NOTE — Progress Notes (Deleted)
   Subjective:    Patient ID: Perry [name], male    DOB: 1946-01-07, 69 y.o.   MRN: [id number]  HPI    Review of Systems  Constitutional: Negative.   HENT: Positive for nosebleeds.   Eyes: Negative.   Respiratory: Positive for apnea and shortness of breath.   Cardiovascular: Negative.   Gastrointestinal: Negative.   Endocrine: Negative.   Genitourinary: Negative.   Musculoskeletal: Negative.   Skin: Negative.   Allergic/Immunologic: Negative.   Neurological: Negative.   Hematological: Negative.   Psychiatric/Behavioral: The patient is hyperactive.        Objective:   Physical Exam        Assessment & Plan:

## 2015-01-05 NOTE — Progress Notes (Addendum)
Subjective:    Patient ID: Perry [name], male    DOB: March 11, 1946, 69 y.o.   MRN: [id number] This chart was scribed for Arlyss Queen, MD by Zola Button, Medical Scribe. This patient was seen in room 22 and the patient's care was started at 1:56 PM.   HPI HPI Comments: Perry [name] is a 69 y.o. male with a hx of DM, CAD, A Fib, HLD and sleep apnea who presents to the [facility] for a complete physical exam.   He was fitted for a CPAP machine, but he has not been tolerating it because he would get nosebleeds. He still does not sleep well. Patient typically has a drink of alcohol before bed.  Patient reports having some pain in the left biceps tendon. He typically sleeps on his left side. He took two Tylenol last night which did provide relief.  Patient reports having having some back pain with stiffness. The pain is worsened when standing up. He does not do any formal exercise. 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  He does report occasional SOB, which is worsened with exertion. This has been decreasing in frequency. He thinks Xanax made the SOB worse, but he is no longer on the Xanax.  Patient is to see Dr. Wynonia Lawman in the near future for evaluation of atrial fibrillation.  Review of Systems  Respiratory: Positive for shortness of breath.   Musculoskeletal: Positive for back pain.       Objective:   Physical Exam CONSTITUTIONAL: Well developed/well nourished HEAD: Normocephalic/atraumatic EYES: EOM/PERRL ENMT: Mucous membranes moist NECK: supple no meningeal signs SPINE: entire spine nontender CV: Slow rhythm with occasional skipped beats LUNGS: Lungs are clear to auscultation bilaterally, no apparent distress ABDOMEN: soft, nontender, no rebound or guarding. There are multiple surgical scars.  GU: Prostate with pea sized nodule with on the right side of the prostate mid gland NEURO: Pt is awake/alert,  moves all extremitiesx4 EXTREMITIES: pulses normal, full ROM. Mild tenderness over the left biceps tendon SKIN: warm, color normal PSYCH: no abnormalities of mood no   EKG normal sinus rhythm sinus bradycardia old inferior infarct no PVCs no atrial fib there is a first-degree heart block Results for orders placed or performed in visit on 01/05/15  POCT urinalysis dipstick  Result Value Ref Range   Color, UA Amber    Clarity, UA Clear    Glucose, UA Negative    Bilirubin, UA Negative    Ketones, UA Negative    Spec Grav, UA >=1.030    Blood, UA negative    pH, UA 5.0    Protein, UA Negative    Urobilinogen, UA 0.2    Nitrite, UA Negative    Leukocytes, UA Negative   POCT UA - Microscopic Only  Result Value Ref Range   WBC, Ur, HPF, POC 0-1    RBC, urine, microscopic Negative    Bacteria, U Microscopic Negative    Mucus, UA Negative    Epithelial cells, urine per micros Negative    Crystals, Ur, HPF, POC     Casts, Ur, LPF, POC     Yeast, UA    [facility] reading (PRIMARY) by  Dr. Everlene Farrier there is mild narrowing of the joint space no calcific tendinitis seen  Assessment & Plan:  We'll try Voltaren gel to his shoulder twice a day. I advised him he needs to cut back on his workload. He seems  under quite a bit of stress. He is working full-time and his regular job and doing work on his farm over the weekend. Referral made to urology for prostate check. I put in a future order for him to get the pneumonia 23 vaccine. He had Prevnar 13. He will continue his cardiology follow-up. Last colonoscopy was 10 years ago. Referral made for repeat colonoscopy. This was done August 2006.I personally performed the services described in this documentation, which was scribed in my presence. The recorded information has been reviewed and is accurate.  Nena Jordan, MD

## 2015-01-05 NOTE — Progress Notes (Deleted)
   Subjective:    Patient ID: Perry [name], male    DOB: 06-28-1946, 69 y.o.   MRN: [id number]  HPI    Review of Systems     Objective:   Physical Exam        Assessment & Plan:

## 2015-01-06 ENCOUNTER — Telehealth: Payer: Self-pay

## 2015-01-06 LAB — MICROALBUMIN, URINE: Microalb, Ur: 0.6 mg/dL (ref <2.0)

## 2015-01-06 LAB — PSA, MEDICARE: PSA: 2.22 ng/mL (ref <=4.00)

## 2015-01-06 NOTE — Telephone Encounter (Signed)
PA needed for voltaren gel. Dr Everlene Farrier stated that pt has heart problems, h/o ruptured appendix, repeat absesses, ileus, and he is not a candidate for NSAID therapy. Pt has tried Tylenol. Completed covermymeds. Pending.

## 2015-01-07 LAB — IFOBT (OCCULT BLOOD): IMMUNOLOGICAL FECAL OCCULT BLOOD TEST: NEGATIVE

## 2015-01-07 NOTE — Telephone Encounter (Signed)
PA approved through 08/06/2038. Notified pharm. 

## 2015-01-07 NOTE — Telephone Encounter (Signed)
Approved   indefinite By [employer]  diclofenac sodium (VOLTAREN) 1 % GEL   They will send out a letter with same information.

## 2015-02-17 ENCOUNTER — Ambulatory Visit (INDEPENDENT_AMBULATORY_CARE_PROVIDER_SITE_OTHER): Payer: Medicare Other | Admitting: *Deleted

## 2015-02-17 DIAGNOSIS — I2510 Atherosclerotic heart disease of native coronary artery without angina pectoris: Secondary | ICD-10-CM | POA: Diagnosis not present

## 2015-02-17 DIAGNOSIS — E784 Other hyperlipidemia: Secondary | ICD-10-CM | POA: Diagnosis not present

## 2015-02-17 DIAGNOSIS — I481 Persistent atrial fibrillation: Secondary | ICD-10-CM | POA: Diagnosis not present

## 2015-02-17 DIAGNOSIS — E668 Other obesity: Secondary | ICD-10-CM | POA: Diagnosis not present

## 2015-02-17 DIAGNOSIS — I480 Paroxysmal atrial fibrillation: Secondary | ICD-10-CM | POA: Diagnosis not present

## 2015-02-17 DIAGNOSIS — N402 Nodular prostate without lower urinary tract symptoms: Secondary | ICD-10-CM | POA: Diagnosis not present

## 2015-02-17 DIAGNOSIS — Z23 Encounter for immunization: Secondary | ICD-10-CM

## 2015-02-17 DIAGNOSIS — I10 Essential (primary) hypertension: Secondary | ICD-10-CM | POA: Diagnosis not present

## 2015-02-17 DIAGNOSIS — I493 Ventricular premature depolarization: Secondary | ICD-10-CM | POA: Diagnosis not present

## 2015-02-17 DIAGNOSIS — I252 Old myocardial infarction: Secondary | ICD-10-CM | POA: Diagnosis not present

## 2015-03-11 DIAGNOSIS — I252 Old myocardial infarction: Secondary | ICD-10-CM | POA: Diagnosis not present

## 2015-03-11 DIAGNOSIS — Z951 Presence of aortocoronary bypass graft: Secondary | ICD-10-CM | POA: Diagnosis not present

## 2015-03-11 DIAGNOSIS — E784 Other hyperlipidemia: Secondary | ICD-10-CM | POA: Diagnosis not present

## 2015-03-11 DIAGNOSIS — I481 Persistent atrial fibrillation: Secondary | ICD-10-CM | POA: Diagnosis not present

## 2015-03-11 DIAGNOSIS — I480 Paroxysmal atrial fibrillation: Secondary | ICD-10-CM | POA: Diagnosis not present

## 2015-03-11 DIAGNOSIS — I493 Ventricular premature depolarization: Secondary | ICD-10-CM | POA: Diagnosis not present

## 2015-03-11 DIAGNOSIS — I10 Essential (primary) hypertension: Secondary | ICD-10-CM | POA: Diagnosis not present

## 2015-03-11 DIAGNOSIS — E668 Other obesity: Secondary | ICD-10-CM | POA: Diagnosis not present

## 2015-03-11 DIAGNOSIS — I2510 Atherosclerotic heart disease of native coronary artery without angina pectoris: Secondary | ICD-10-CM | POA: Diagnosis not present

## 2015-05-11 ENCOUNTER — Encounter: Payer: Self-pay | Admitting: Emergency Medicine

## 2015-05-19 ENCOUNTER — Telehealth: Payer: Self-pay | Admitting: Family Medicine

## 2015-05-19 ENCOUNTER — Telehealth: Payer: Self-pay

## 2015-05-19 NOTE — Telephone Encounter (Signed)
Left a message for patient to call and schedule appointment for flu shot, discuss statin therapy and find out if and is so where and when did he have his diabetic eye exam done.

## 2015-05-19 NOTE — Telephone Encounter (Signed)
Spoke with patient and he has had his flu shot at work so I updated his chart, and he will get with his wife and go to the eye doctor and have them fax [place] a copy of the report

## 2015-05-21 DIAGNOSIS — Z23 Encounter for immunization: Secondary | ICD-10-CM | POA: Diagnosis not present
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
## 2015-08-13 ENCOUNTER — Ambulatory Visit (INDEPENDENT_AMBULATORY_CARE_PROVIDER_SITE_OTHER): Payer: Medicare Other | Admitting: Emergency Medicine

## 2015-08-13 ENCOUNTER — Ambulatory Visit (INDEPENDENT_AMBULATORY_CARE_PROVIDER_SITE_OTHER): Payer: Medicare Other

## 2015-08-13 VITALS — BP 132/88 | HR 67 | Temp 98.6°F | Resp 18 | Ht 73.0 in | Wt 249.0 lb

## 2015-08-13 DIAGNOSIS — R739 Hyperglycemia, unspecified: Secondary | ICD-10-CM | POA: Diagnosis not present

## 2015-08-13 DIAGNOSIS — R059 Cough, unspecified: Secondary | ICD-10-CM

## 2015-08-13 DIAGNOSIS — R05 Cough: Secondary | ICD-10-CM

## 2015-08-13 DIAGNOSIS — R7309 Other abnormal glucose: Secondary | ICD-10-CM | POA: Diagnosis not present

## 2015-08-13 LAB — POCT CBC
GRANULOCYTE PERCENT: 64.8 % (ref 37–80)
HCT, POC: 43 % — AB (ref 43.5–53.7)
Hemoglobin: 14.7 g/dL (ref 14.1–18.1)
Lymph, poc: 1.6 (ref 0.6–3.4)
MCH: 31.8 pg — AB (ref 27–31.2)
MCHC: 34.3 g/dL (ref 31.8–35.4)
MCV: 92.7 fL (ref 80–97)
MID (cbc): 0.6 (ref 0–0.9)
MPV: 6.9 fL (ref 0–99.8)
POC Granulocyte: 4.2 (ref 2–6.9)
POC LYMPH PERCENT: 25.3 %L (ref 10–50)
POC MID %: 9.9 % (ref 0–12)
Platelet Count, POC: 189 10*3/uL (ref 142–424)
RBC: 4.63 M/uL — AB (ref 4.69–6.13)
RDW, POC: 12.6 %
WBC: 6.5 10*3/uL (ref 4.6–10.2)

## 2015-08-13 LAB — BASIC METABOLIC PANEL WITH GFR
BUN: 19 mg/dL (ref 7–25)
CHLORIDE: 104 mmol/L (ref 98–110)
CO2: 27 mmol/L (ref 20–31)
CREATININE: 1.07 mg/dL (ref 0.70–1.25)
Calcium: 9.9 mg/dL (ref 8.6–10.3)
GFR, EST AFRICAN AMERICAN: 81 mL/min (ref >=60)
GFR, Est Non African American: 70 mL/min (ref >=60)
Glucose, Bld: 118 mg/dL — ABNORMAL HIGH (ref 65–99)
POTASSIUM: 4.3 mmol/L (ref 3.5–5.3)
SODIUM: 138 mmol/L (ref 135–146)

## 2015-08-13 LAB — POCT GLYCOSYLATED HEMOGLOBIN (HGB A1C): HEMOGLOBIN A1C: 6.6

## 2015-08-13 MED ORDER — BENZONATATE 100 MG PO CAPS: 100.0000 mg | ORAL_CAPSULE | Freq: Three times a day (TID) | ORAL | Status: DC | PRN

## 2015-08-13 MED ORDER — HYDROCODONE-HOMATROPINE 5-1.5 MG/5ML PO SYRP: 5.0000 mL | ORAL_SOLUTION | Freq: Three times a day (TID) | ORAL | Status: DC | PRN

## 2015-08-13 NOTE — Progress Notes (Signed)
By signing my name below, I, Raven Small, attest that this documentation has been prepared under the direction and in the presence of Arlyss Queen, MD.  Electronically Signed: Thea Alken, ED Scribe. 08/13/2015. 12:19 PM.  Chief Complaint:  Chief Complaint  Patient presents with  . Cough    x2 days dry     HPI: Perry [name] is a 70 y.o. male who reports to [facility] today complaining of a dry cough that began 2 days ago. Pt states symptoms started with sore throat 3 days ago which gradually worsened with cough spells and chest congestion. Pt states when lying on his right side he "gurggles". Cough keeps him awake at night. He has been taking delsym with temporary relief to cough. He is going to [place] in a week or 2.   Pt states he has trouble sleeping. He is able to fall asleep but wakes up every 2 hours throughout the night. He denies his mind racing. He took trazodone last night, which he states helped him sleep through the night.   Past Medical History  Diagnosis Date  . CAD (coronary artery disease)   . Hernia, inguinal     right  . Hyperglycemia   . Hearing loss     mild  . Hypertension   . Old inferior wall myocardial infarction 2004  . Lumbar disc disease   . CAD (coronary artery disease) of artery bypass graft 05/04/2014  . Dyslipidemia 05/04/2014  . Acute appendicitis with perforation and peritoneal abscess 05/04/2014  . Ileus, postoperative 05/04/2014  . Abscess of abdominal cavity ([place]) 05/04/2014  . A-fib ([place]) 04/2014  . CHF (congestive heart failure) ([place])     pt not aware  . Diabetes mellitus     not on medication - pt not aware of this  . Allergy    Past Surgical History  Procedure Laterality Date  . Lumbar laminectomy  2001    lumbar  . Coronary artery bypass graft  2004    LIMA to LAD and SVG to RCA (emergent)  . Coronary angioplasty      multiple  . Laparoscopic appendectomy N/A 04/21/2014    Procedure: APPENDECTOMY LAPAROSCOPIC;  Surgeon: Rolm Bookbinder, MD;  Location: [place];  Service: General;  Laterality: N/A;  . Tonsillectomy    . Colonoscopy    . Laparoscopic appendectomy N/A 06/24/2014    Procedure: APPENDECTOMY LAPAROSCOPIC ;  Surgeon: Rolm Bookbinder, MD;  Location: [facility] OR;  Service: General;  Laterality: N/A;   Social History   Social History  . Marital Status: Married    Spouse Name: [name]  . Number of Children: 2  . Years of Education: Masters   Occupational History  . General Contractor    Social History Main Topics  . Smoking status: Former Smoker    Types: Cigarettes    Quit date: 06/24/1979  . Smokeless tobacco: Former Systems developer    Types: Chew  . Alcohol Use: 8.4 oz/week    14 Cans of beer per week     Comment: 2 drinks a day  . Drug Use: No  . Sexual Activity: Not Asked   Other Topics Concern  . None   Social History Narrative   Patient is married [name]) and lives at home with his wife.   Patient has two adult children.   Patient is working full-time.   Patient has a Master's degree.   Patient drinks one cup of coffee every morning.   Patient is right-handed.  Family History  Problem Relation Age of Onset  . Heart disease Father   . Heart disease Brother   . Hypertension Brother   . Cancer Sister   . Heart disease Paternal Grandmother    Allergies  Allergen Reactions  . Xanax [Alprazolam] Shortness Of Breath   Prior to Admission medications   Medication Sig Start Date End Date Taking? Authorizing Provider  aspirin 81 MG chewable tablet Chew 81 mg by mouth daily.   Yes Historical Provider, MD  diclofenac sodium (VOLTAREN) 1 % GEL Apply 2 g topically 3 (three) times daily. 01/05/15  Yes Darlyne Russian, MD  fenofibrate (TRICOR) 145 MG tablet Take 145 mg by mouth daily.     Yes Historical Provider, MD  fish oil-omega-3 fatty acids 1000 MG capsule Take 2 g by mouth daily.     Yes Historical Provider, MD  lidocaine (LIDODERM) 5 % Place 1 patch onto the skin daily. Remove & Discard patch within  12 hours or as directed by MD 07/14/14  Yes Veryl Speak, MD  lisinopril (PRINIVIL,ZESTRIL) 40 MG tablet Take 1/2 tablet for now and follow up with your primary care doctor Patient taking differently: Take 40 mg by mouth daily.  05/04/14  Yes Earnstine Regal, PA-C  metoprolol (TOPROL-XL) 50 MG 24 hr tablet Take 25 mg by mouth daily.    Yes Historical Provider, MD  NON FORMULARY Taking a probiotic once a day   Yes Historical Provider, MD  rosuvastatin (CRESTOR) 5 MG tablet Take 5 mg by mouth daily.     Yes Historical Provider, MD  zoster vaccine live, PF, (ZOSTAVAX) 27035 UNT/0.65ML injection Inject 19,400 Units into the skin once. 01/05/15  Yes Darlyne Russian, MD  acetaminophen (TYLENOL) 325 MG tablet Take 2 tablets (650 mg total) by mouth every 4 (four) hours as needed for fever (temp 101). Patient not taking: Reported on 08/13/2015 05/04/14   Earnstine Regal, PA-C     ROS: The patient denies fevers, chills, night sweats, unintentional weight loss, chest pain, palpitations, wheezing, dyspnea on exertion, nausea, vomiting, abdominal pain, dysuria, hematuria, melena, numbness, weakness, or tingling.   All other systems have been reviewed and were otherwise negative with the exception of those mentioned in the HPI and as above.    PHYSICAL EXAM: Filed Vitals:   08/13/15 1100  BP: 132/88  Pulse: 67  Temp: 98.6 F (37 C)  Resp: 18   Body mass index is 32.86 kg/(m^2).   General: Alert, no acute distress HEENT:  Normocephalic, atraumatic, oropharynx patent. Cerumen present bilaterally.  Eye: Juliette Mangle [facility] Cardiovascular:  Regular rate and rhythm, no rubs murmurs or gallops.  No Carotid bruits, radial pulse intact. No pedal edema.  Respiratory: Clear to auscultation bilaterally.  Bibasilar rhonchi. No wheezes or rales. No cyanosis, no use of accessory musculature.  Abdominal: No organomegaly, abdomen is soft and non-tender, positive bowel sounds.  No masses. Musculoskeletal: Gait intact. No  edema, tenderness Skin: No rashes. Neurologic: Facial musculature symmetric. Psychiatric: Patient acts appropriately throughout our interaction. Lymphatic: No cervical or submandibular lymphadenopathy    LABS: Results for orders placed or performed in visit on 08/13/15  POCT CBC  Result Value Ref Range   WBC 6.5 4.6 - 10.2 K/uL   Lymph, poc 1.6 0.6 - 3.4   POC LYMPH PERCENT 25.3 10 - 50 %L   MID (cbc) 0.6 0 - 0.9   POC MID % 9.9 0 - 12 %M   POC Granulocyte 4.2 2 - 6.9  Granulocyte percent 64.8 37 - 80 %G   RBC 4.63 (A) 4.69 - 6.13 M/uL   Hemoglobin 14.7 14.1 - 18.1 g/dL   HCT, POC 43.0 (A) 43.5 - 53.7 %   MCV 92.7 80 - 97 fL   MCH, POC 31.8 (A) 27 - 31.2 pg   MCHC 34.3 31.8 - 35.4 g/dL   RDW, POC 12.6 %   Platelet Count, POC 189 142 - 424 K/uL   MPV 6.9 0 - 99.8 fL   Results for orders placed or performed in visit on 08/13/15  POCT CBC  Result Value Ref Range   WBC 6.5 4.6 - 10.2 K/uL   Lymph, poc 1.6 0.6 - 3.4   POC LYMPH PERCENT 25.3 10 - 50 %L   MID (cbc) 0.6 0 - 0.9   POC MID % 9.9 0 - 12 %M   POC Granulocyte 4.2 2 - 6.9   Granulocyte percent 64.8 37 - 80 %G   RBC 4.63 (A) 4.69 - 6.13 M/uL   Hemoglobin 14.7 14.1 - 18.1 g/dL   HCT, POC 43.0 (A) 43.5 - 53.7 %   MCV 92.7 80 - 97 fL   MCH, POC 31.8 (A) 27 - 31.2 pg   MCHC 34.3 31.8 - 35.4 g/dL   RDW, POC 12.6 %   Platelet Count, POC 189 142 - 424 K/uL   MPV 6.9 0 - 99.8 fL  POCT glycosylated hemoglobin (Hb A1C)  Result Value Ref Range   Hemoglobin A1C 6.6     EKG/XRAY:   Primary read interpreted by Dr. Everlene Farrier at [facility]. CXR: Status post coronary artery bypass graft. No pneumonic infiltrates. Old healed left rib fractures noted.  ASSESSMENT/PLAN:  white count normal no pneumonia on x-ray will treat with Tessalon Perles and Hycodan for cough. He asked about sleep medication. I do not want him to start on this at the present time. Once he is over his illness we can try a sleep med. He is interested in trazodone. I  personally performed the services described in this documentation, which was scribed in my presence. The recorded information has been reviewed and is accurate. Hemoglobin A1c was 6.6 which is acceptable considering this is just after the holidays.I personally performed the services described in this documentation, which was scribed in my presence. The recorded information has been reviewed and is accurate.   Gross sideeffects, risk and benefits, and alternatives of medications d/w patient. Patient is aware that all medications have potential sideeffects and we are unable to predict every sideeffect or drug-drug interaction that may occur.  Arlyss Queen MD 08/13/2015 12:19 PM

## 2015-08-13 NOTE — Patient Instructions (Signed)

## 2015-10-11 DIAGNOSIS — E668 Other obesity: Secondary | ICD-10-CM | POA: Diagnosis not present

## 2015-10-11 DIAGNOSIS — I2510 Atherosclerotic heart disease of native coronary artery without angina pectoris: Secondary | ICD-10-CM | POA: Diagnosis not present

## 2015-10-11 DIAGNOSIS — Z951 Presence of aortocoronary bypass graft: Secondary | ICD-10-CM | POA: Diagnosis not present

## 2015-10-11 DIAGNOSIS — I480 Paroxysmal atrial fibrillation: Secondary | ICD-10-CM | POA: Diagnosis not present

## 2015-10-11 DIAGNOSIS — I493 Ventricular premature depolarization: Secondary | ICD-10-CM | POA: Diagnosis not present

## 2015-10-11 DIAGNOSIS — I10 Essential (primary) hypertension: Secondary | ICD-10-CM | POA: Diagnosis not present

## 2015-10-11 DIAGNOSIS — I252 Old myocardial infarction: Secondary | ICD-10-CM | POA: Diagnosis not present

## 2015-10-11 DIAGNOSIS — E784 Other hyperlipidemia: Secondary | ICD-10-CM | POA: Diagnosis not present

## 2016-01-19 DIAGNOSIS — L570 Actinic keratosis: Secondary | ICD-10-CM | POA: Diagnosis not present

## 2016-01-19 DIAGNOSIS — L820 Inflamed seborrheic keratosis: Secondary | ICD-10-CM | POA: Diagnosis not present

## 2016-01-19 DIAGNOSIS — L821 Other seborrheic keratosis: Secondary | ICD-10-CM | POA: Diagnosis not present

## 2016-01-19 DIAGNOSIS — L720 Epidermal cyst: Secondary | ICD-10-CM | POA: Diagnosis not present

## 2016-02-14 ENCOUNTER — Encounter: Payer: Self-pay | Admitting: Family Medicine

## 2016-02-14 ENCOUNTER — Other Ambulatory Visit: Payer: Self-pay | Admitting: Gastroenterology

## 2016-02-14 DIAGNOSIS — Z86010 Personal history of colonic polyps: Secondary | ICD-10-CM | POA: Diagnosis not present

## 2016-02-14 DIAGNOSIS — K635 Polyp of colon: Secondary | ICD-10-CM | POA: Diagnosis not present

## 2016-02-14 DIAGNOSIS — D123 Benign neoplasm of transverse colon: Secondary | ICD-10-CM | POA: Diagnosis not present

## 2016-04-03 ENCOUNTER — Other Ambulatory Visit: Payer: Self-pay

## 2016-04-12 ENCOUNTER — Other Ambulatory Visit: Payer: Self-pay | Admitting: Emergency Medicine

## 2016-04-12 DIAGNOSIS — M25512 Pain in left shoulder: Secondary | ICD-10-CM

## 2016-04-12 MED ORDER — METOPROLOL SUCCINATE ER 50 MG PO TB24: 25.0000 mg | ORAL_TABLET | Freq: Every day | ORAL | 90 refills | Status: DC

## 2016-04-12 MED ORDER — ROSUVASTATIN CALCIUM 5 MG PO TABS: 5.0000 mg | ORAL_TABLET | Freq: Every day | ORAL | 3 refills | Status: DC

## 2016-04-12 MED ORDER — LISINOPRIL 40 MG PO TABS: ORAL_TABLET | ORAL | 3 refills | Status: DC

## 2016-04-12 MED ORDER — FENOFIBRATE 145 MG PO TABS: 145.0000 mg | ORAL_TABLET | Freq: Every day | ORAL | 3 refills | Status: DC

## 2016-04-13 DIAGNOSIS — Z951 Presence of aortocoronary bypass graft: Secondary | ICD-10-CM | POA: Diagnosis not present

## 2016-04-13 DIAGNOSIS — I480 Paroxysmal atrial fibrillation: Secondary | ICD-10-CM | POA: Diagnosis not present

## 2016-04-13 DIAGNOSIS — I10 Essential (primary) hypertension: Secondary | ICD-10-CM | POA: Diagnosis not present

## 2016-04-13 DIAGNOSIS — E668 Other obesity: Secondary | ICD-10-CM | POA: Diagnosis not present

## 2016-04-13 DIAGNOSIS — E784 Other hyperlipidemia: Secondary | ICD-10-CM | POA: Diagnosis not present

## 2016-04-13 DIAGNOSIS — I493 Ventricular premature depolarization: Secondary | ICD-10-CM | POA: Diagnosis not present

## 2016-04-13 DIAGNOSIS — I2510 Atherosclerotic heart disease of native coronary artery without angina pectoris: Secondary | ICD-10-CM | POA: Diagnosis not present

## 2016-04-13 DIAGNOSIS — I252 Old myocardial infarction: Secondary | ICD-10-CM | POA: Diagnosis not present

## 2016-04-14 ENCOUNTER — Ambulatory Visit (INDEPENDENT_AMBULATORY_CARE_PROVIDER_SITE_OTHER): Payer: Medicare Other | Admitting: Emergency Medicine

## 2016-04-14 ENCOUNTER — Ambulatory Visit (INDEPENDENT_AMBULATORY_CARE_PROVIDER_SITE_OTHER): Payer: Medicare Other

## 2016-04-14 VITALS — BP 122/72 | HR 63 | Temp 98.2°F | Resp 17 | Ht 72.5 in | Wt 250.0 lb

## 2016-04-14 DIAGNOSIS — Z125 Encounter for screening for malignant neoplasm of prostate: Secondary | ICD-10-CM

## 2016-04-14 DIAGNOSIS — E119 Type 2 diabetes mellitus without complications: Secondary | ICD-10-CM

## 2016-04-14 DIAGNOSIS — R079 Chest pain, unspecified: Secondary | ICD-10-CM | POA: Diagnosis not present

## 2016-04-14 DIAGNOSIS — R0602 Shortness of breath: Secondary | ICD-10-CM | POA: Diagnosis not present

## 2016-04-14 DIAGNOSIS — Z1159 Encounter for screening for other viral diseases: Secondary | ICD-10-CM

## 2016-04-14 DIAGNOSIS — Z0000 Encounter for general adult medical examination without abnormal findings: Secondary | ICD-10-CM

## 2016-04-14 DIAGNOSIS — E785 Hyperlipidemia, unspecified: Secondary | ICD-10-CM | POA: Diagnosis not present

## 2016-04-14 DIAGNOSIS — I10 Essential (primary) hypertension: Secondary | ICD-10-CM

## 2016-04-14 DIAGNOSIS — I25700 Atherosclerosis of coronary artery bypass graft(s), unspecified, with unstable angina pectoris: Secondary | ICD-10-CM | POA: Diagnosis not present

## 2016-04-14 LAB — POCT CBC
Granulocyte percent: 59.8 %G (ref 37–80)
HCT, POC: 43.3 % — AB (ref 43.5–53.7)
HEMOGLOBIN: 15.2 g/dL (ref 14.1–18.1)
LYMPH, POC: 1.6 (ref 0.6–3.4)
MCH, POC: 32.5 pg — AB (ref 27–31.2)
MCHC: 35.1 g/dL (ref 31.8–35.4)
MCV: 92.7 fL (ref 80–97)
MID (cbc): 0.5 (ref 0–0.9)
MPV: 6.9 fL (ref 0–99.8)
POC Granulocyte: 3.2 (ref 2–6.9)
POC LYMPH PERCENT: 31.1 %L (ref 10–50)
POC MID %: 9.1 % (ref 0–12)
Platelet Count, POC: 173 10*3/uL (ref 142–424)
RBC: 4.67 M/uL — AB (ref 4.69–6.13)
RDW, POC: 12.9 %
WBC: 5.3 10*3/uL (ref 4.6–10.2)

## 2016-04-14 LAB — COMPLETE METABOLIC PANEL WITHOUT GFR
ALT: 35 U/L (ref 9–46)
AST: 30 U/L (ref 10–35)
Albumin: 4.3 g/dL (ref 3.6–5.1)
Alkaline Phosphatase: 48 U/L (ref 40–115)
BUN: 26 mg/dL — ABNORMAL HIGH (ref 7–25)
CO2: 24 mmol/L (ref 20–31)
Calcium: 9.5 mg/dL (ref 8.6–10.3)
Chloride: 105 mmol/L (ref 98–110)
Creat: 1.12 mg/dL (ref 0.70–1.18)
GFR, Est African American: 77 mL/min
GFR, Est Non African American: 66 mL/min
Glucose, Bld: 124 mg/dL — ABNORMAL HIGH (ref 65–99)
Potassium: 4.1 mmol/L (ref 3.5–5.3)
Sodium: 139 mmol/L (ref 135–146)
Total Bilirubin: 0.7 mg/dL (ref 0.2–1.2)
Total Protein: 7.1 g/dL (ref 6.1–8.1)

## 2016-04-14 LAB — MICROALBUMIN, URINE: Microalb, Ur: 0.5 mg/dL

## 2016-04-14 LAB — LIPID PANEL
Cholesterol: 126 mg/dL (ref 125–200)
HDL: 33 mg/dL — AB (ref >=40)
LDL CALC: 67 mg/dL (ref <130)
Total CHOL/HDL Ratio: 3.8 Ratio (ref <=5.0)
Triglycerides: 128 mg/dL (ref <150)
VLDL: 26 mg/dL (ref <30)

## 2016-04-14 LAB — HEPATITIS C ANTIBODY: HCV Ab: NEGATIVE

## 2016-04-14 LAB — GLUCOSE, POCT (MANUAL RESULT ENTRY): POC GLUCOSE: 138 mg/dL — AB (ref 70–99)

## 2016-04-14 LAB — POCT GLYCOSYLATED HEMOGLOBIN (HGB A1C): Hemoglobin A1C: 7.3

## 2016-04-14 LAB — TSH: TSH: 2.46 m[IU]/L (ref 0.40–4.50)

## 2016-04-14 MED ORDER — METFORMIN HCL ER 500 MG PO TB24: ORAL_TABLET | ORAL | 11 refills | Status: DC

## 2016-04-14 MED ORDER — ZOSTER VACCINE LIVE 19400 UNT/0.65ML ~~LOC~~ SUSR: 0.6500 mL | Freq: Once | SUBCUTANEOUS | 0 refills | Status: AC

## 2016-04-14 NOTE — Progress Notes (Addendum)
By signing my name below, I, Mesha Guinyard, attest that this documentation has been prepared under the direction and in the presence of Arlyss Queen, MD.  Electronically Signed: Verlee Monte, Medical Scribe. 04/14/16. 10:23 AM.  Chief Complaint:  Chief Complaint  Patient presents with  . Annual Exam    HPI: Perry [name] is a 70 y.o. male who reports to [facility] today for his annual exam. Pt mentions he gets blood test at work. Pt reports breaking out in sweats twice when he's at work.  CAD: Pt went to Dr. Oran Rein and he plans on getting a stress test at the end of this month.  Sleep Apnea: Pt reports short winded and he doesn't sleep. Pt gets nose bleeds when he uses his CPAP machine.  Pulmonary: Pt quit smoking at 70 y/o. Pt hasn't had a CXR  Immunizations: Pt gets his flu shot at work and would like to get his shingles shot. Immunization History  Administered Date(s) Administered  . Influenza Split 04/24/2011  . Influenza-Unspecified 05/17/2015  . Pneumococcal Conjugate-13 01/22/2014  . Pneumococcal Polysaccharide-23 02/17/2015  . Tdap 10/13/2008   Cancer Screening: Prostate CA: Pt is followed by urology and plans on getting another checkup Lab Results  Component Value Date   PSA 2.22 01/05/2015   PSA 1.77 01/22/2014  Colon CA: Pt had a colonoscopy done and he found 1-2 polyps and he needs a repeat in 5 years.  Depression: Pt states he's happy. Depression screen [facility] 2/9 04/14/2016 08/13/2015 01/05/2015 01/22/2014  Decreased Interest 0 0 0 0  Down, Depressed, Hopeless 0 0 0 0  PHQ - 2 Score 0 0 0 0   Past Medical History:  Diagnosis Date  . A-fib ([place]) 04/2014  . Abscess of abdominal cavity ([place]) 05/04/2014  . Acute appendicitis with perforation and peritoneal abscess 05/04/2014  . Allergy   . CAD (coronary artery disease)   . CAD (coronary artery disease) of artery bypass graft 05/04/2014  . CHF (congestive heart failure) ([place])    pt not aware  . Diabetes mellitus    not on medication - pt not aware of this  . Dyslipidemia 05/04/2014  . Hearing loss    mild  . Hernia, inguinal    right  . Hyperglycemia   . Hypertension   . Ileus, postoperative 05/04/2014  . Lumbar disc disease   . Old inferior wall myocardial infarction 2004   Past Surgical History:  Procedure Laterality Date  . COLONOSCOPY    . CORONARY ANGIOPLASTY     multiple  . CORONARY ARTERY BYPASS GRAFT  2004   LIMA to LAD and SVG to RCA (emergent)  . LAPAROSCOPIC APPENDECTOMY N/A 04/21/2014   Procedure: APPENDECTOMY LAPAROSCOPIC;  Surgeon: Rolm Bookbinder, MD;  Location: [place];  Service: General;  Laterality: N/A;  . LAPAROSCOPIC APPENDECTOMY N/A 06/24/2014   Procedure: APPENDECTOMY LAPAROSCOPIC ;  Surgeon: Rolm Bookbinder, MD;  Location: [place];  Service: General;  Laterality: N/A;  . LUMBAR LAMINECTOMY  2001   lumbar  . TONSILLECTOMY     Social History   Social History  . Marital status: Married    Spouse name: [name]  . Number of children: 2  . Years of education: Masters   Occupational History  . General Contractor    Social History Main Topics  . Smoking status: Former Smoker    Types: Cigarettes    Quit date: 06/24/1979  . Smokeless tobacco: Former Systems developer    Types: Chew  . Alcohol use  8.4 oz/week    14 Cans of beer per week     Comment: 2 drinks a day  . Drug use: No  . Sexual activity: Not Asked   Other Topics Concern  . None   Social History Narrative   Patient is married [name]) and lives at home with his wife.   Patient has two adult children.   Patient is working full-time.   Patient has a Master's degree.   Patient drinks one cup of coffee every morning.   Patient is right-handed.   Family History  Problem Relation Age of Onset  . Heart disease Father   . Heart disease Brother   . Hypertension Brother   . Cancer Sister   . Heart disease Paternal Grandmother    Allergies  Allergen Reactions  . Xanax [Alprazolam] Shortness Of Breath   Prior to  Admission medications   Medication Sig Start Date End Date Taking? Authorizing Provider  fish oil-omega-3 fatty acids 1000 MG capsule Take 2 g by mouth daily.     Yes Historical Provider, MD  lisinopril (PRINIVIL,ZESTRIL) 40 MG tablet One tablet daily 04/12/16  Yes Darlyne Russian, MD  metoprolol succinate (TOPROL-XL) 50 MG 24 hr tablet Take 1 tablet (50 mg total) by mouth daily. 04/12/16  Yes Darlyne Russian, MD  NON FORMULARY Taking a probiotic once a day   Yes Historical Provider, MD  rosuvastatin (CRESTOR) 5 MG tablet Take 1 tablet (5 mg total) by mouth daily. 04/12/16  Yes Darlyne Russian, MD  zoster vaccine live, PF, (ZOSTAVAX) 89381 UNT/0.65ML injection Inject 19,400 Units into the skin once. 01/05/15  Yes Darlyne Russian, MD  acetaminophen (TYLENOL) 325 MG tablet Take 2 tablets (650 mg total) by mouth every 4 (four) hours as needed for fever (temp 101). Patient not taking: Reported on 08/13/2015 05/04/14   Earnstine Regal, PA-C  aspirin 81 MG chewable tablet Chew 81 mg by mouth daily.    Historical Provider, MD  benzonatate (TESSALON) 100 MG capsule Take 1-2 capsules (100-200 mg total) by mouth 3 (three) times daily as needed for cough. Patient not taking: Reported on 04/14/2016 08/13/15   Darlyne Russian, MD  diclofenac sodium (VOLTAREN) 1 % GEL Apply 2 g topically 3 (three) times daily. Patient not taking: Reported on 04/14/2016 01/05/15   Darlyne Russian, MD  fenofibrate (TRICOR) 145 MG tablet Take 1 tablet (145 mg total) by mouth daily. Patient not taking: Reported on 04/14/2016 04/12/16   Darlyne Russian, MD  HYDROcodone-homatropine [facility]) 5-1.5 MG/5ML syrup Take 5 mLs by mouth every 8 (eight) hours as needed for cough. Patient not taking: Reported on 04/14/2016 08/13/15   Darlyne Russian, MD  lidocaine (LIDODERM) 5 % Place 1 patch onto the skin daily. Remove & Discard patch within 12 hours or as directed by MD Patient not taking: Reported on 04/14/2016 07/14/14   Veryl Speak, MD     ROS: The patient denies fevers,  unintentional weight loss, chest pain, palpitations, wheezing, nausea, vomiting, abdominal pain, dysuria, hematuria, melena, numbness, weakness, or tingling. +dyspnea +diaphoresis  All other systems have been reviewed and were otherwise negative with the exception of those mentioned in the HPI and as above.    PHYSICAL EXAM: Vitals:   04/14/16 0948  BP: 122/72  Pulse: 63  Resp: 17  Temp: 98.2 F (36.8 C)   Body mass index is 33.44 kg/m.  General: Alert, no acute distress HEENT:  Normocephalic, atraumatic, oropharynx patent. Cerumen impaction in the  left ear Eye: [facility], [facility] Cardiovascular:  Regular rate and rhythm, no rubs murmurs or gallops.  No Carotid bruits, radial pulse intact. No pedal edema.  Respiratory: Clear to auscultation bilaterally.  No wheezes, rales, or rhonchi.  No cyanosis, no use of accessory musculature Abdominal: No organomegaly, abdomen is soft and non-tender, positive bowel sounds.  No masses. Musculoskeletal: Gait intact. No edema, tenderness Skin: No rashes. Neurologic: Facial musculature symmetric. Psychiatric: Patient acts appropriately throughout our interaction. Lymphatic: No cervical or submandibular lymphadenopathy Prostate: Mild enlarge prostate without nodularity  LABS: Results for orders placed or performed in visit on 04/14/16  POCT CBC  Result Value Ref Range   WBC 5.3 4.6 - 10.2 K/uL   Lymph, poc 1.6 0.6 - 3.4   POC LYMPH PERCENT 31.1 10 - 50 %L   MID (cbc) 0.5 0 - 0.9   POC MID % 9.1 0 - 12 %M   POC Granulocyte 3.2 2 - 6.9   Granulocyte percent 59.8 37 - 80 %G   RBC 4.67 (A) 4.69 - 6.13 M/uL   Hemoglobin 15.2 14.1 - 18.1 g/dL   HCT, POC 43.3 (A) 43.5 - 53.7 %   MCV 92.7 80 - 97 fL   MCH, POC 32.5 (A) 27 - 31.2 pg   MCHC 35.1 31.8 - 35.4 g/dL   RDW, POC 12.9 %   Platelet Count, POC 173 142 - 424 K/uL   MPV 6.9 0 - 99.8 fL  POCT glucose (manual entry)  Result Value Ref Range   POC Glucose 138 (A) 70 - 99 mg/dl  POCT  glycosylated hemoglobin (Hb A1C)  Result Value Ref Range   Hemoglobin A1C 7.3    Meds ordered this encounter  Medications  . Zoster Vaccine Live, PF, (ZOSTAVAX) 27517 UNT/0.65ML injection    Sig: Inject 19,400 Units into the skin once.    Dispense:  1 each    Refill:  0  . metFORMIN (GLUCOPHAGE XR) 500 MG 24 hr tablet    Sig: Take one a day for the first week then 2 tablets every morning.    Dispense:  60 tablet    Refill:  11   EKG/XRAY:   Primary read interpreted by Dr. Everlene Farrier at [facility]. Dg Chest 2 View  Result Date: 04/14/2016 CLINICAL DATA:  Pain. EXAM: CHEST  2 VIEW COMPARISON:  None. FINDINGS: The heart size and mediastinal contours are within normal limits. Both lungs are clear. The visualized skeletal structures are unremarkable. IMPRESSION: No active cardiopulmonary disease. Electronically Signed   By: Marcello Moores  Register   On: 04/14/2016 11:06     ASSESSMENT/PLAN: Diabetes not under control. Will start Glucophage 500 mg extended release one a day for the first week and then 2 tablets daily. He was instructed to have his hemoglobin A1c repeated in 3 months. I advised him to start checking his sugars 2-3 times a week. He was given a prescription for Zostavax.I personally performed the services described in this documentation, which was scribed in my presence. The recorded information has been reviewed and is accurate. He is going to get his flu shot at work.   Gross sideeffects, risk and benefits, and alternatives of medications d/w patient. Patient is aware that all medications have potential sideeffects and we are unable to predict every sideeffect or drug-drug interaction that may occur.  Arlyss Queen MD 04/14/2016 10:25 AM

## 2016-04-14 NOTE — Patient Instructions (Addendum)
   IF you received an x-ray today, you will receive an invoice from [facility]. Please contact [facility] at [phone] with questions or concerns regarding your invoice.   IF you received labwork today, you will receive an invoice from [facility]. Please contact [facility] at [phone] with questions or concerns regarding your invoice.   Our billing staff will not be able to assist you with questions regarding bills from these companies.  You will be contacted with the lab results as soon as they are available. The fastest way to get your results is to activate your My Chart account. Instructions are located on the last page of this paperwork. If you have not heard from us regarding the results in 2 weeks, please contact this office.    Health Maintenance, Male A healthy lifestyle and preventative care can promote health and wellness.  Maintain regular health, dental, and eye exams.  Eat a healthy diet. Foods like vegetables, fruits, whole grains, low-fat dairy products, and lean protein foods contain the nutrients you need and are low in calories. Decrease your intake of foods high in solid fats, added sugars, and salt. Get information about a proper diet from your health care provider, if necessary.  Regular physical exercise is one of the most important things you can do for your health. Most adults should get at least 150 minutes of moderate-intensity exercise (any activity that increases your heart rate and causes you to sweat) each week. In addition, most adults need muscle-strengthening exercises on 2 or more days a week.   Maintain a healthy weight. The body mass index (BMI) is a screening tool to identify possible weight problems. It provides an estimate of body fat based on height and weight. Your health care provider can find your BMI and can help you achieve or maintain a healthy weight. For males 20 years and older:  A BMI  below 18.5 is considered underweight.  A BMI of 18.5 to 24.9 is normal.  A BMI of 25 to 29.9 is considered overweight.  A BMI of 30 and above is considered obese.  Maintain normal blood lipids and cholesterol by exercising and minimizing your intake of saturated fat. Eat a balanced diet with plenty of fruits and vegetables. Blood tests for lipids and cholesterol should begin at age 20 and be repeated every 5 years. If your lipid or cholesterol levels are high, you are over age 50, or you are at high risk for heart disease, you may need your cholesterol levels checked more frequently.Ongoing high lipid and cholesterol levels should be treated with medicines if diet and exercise are not working.  If you smoke, find out from your health care provider how to quit. If you do not use tobacco, do not start.  Lung cancer screening is recommended for adults aged 55-80 years who are at high risk for developing lung cancer because of a history of smoking. A yearly low-dose CT scan of the lungs is recommended for people who have at least a 30-pack-year history of smoking and are current smokers or have quit within the past 15 years. A pack year of smoking is smoking an average of 1 pack of cigarettes a day for 1 year (for example, a 30-pack-year history of smoking could mean smoking 1 pack a day for 30 years or 2 packs a day for 15 years). Yearly screening should continue until the smoker has stopped smoking for at least 15 years. Yearly screening should be stopped   for people who develop a health problem that would prevent them from having lung cancer treatment.  If you choose to drink alcohol, do not have more than 2 drinks per day. One drink is considered to be 12 oz (360 mL) of beer, 5 oz (150 mL) of wine, or 1.5 oz (45 mL) of liquor.  Avoid the use of street drugs. Do not share needles with anyone. Ask for help if you need support or instructions about stopping the use of drugs.  High blood pressure  causes heart disease and increases the risk of stroke. High blood pressure is more likely to develop in:  People who have blood pressure in the end of the normal range (100-139/85-89 mm Hg).  People who are overweight or obese.  People who are African American.  If you are 18-39 years of age, have your blood pressure checked every 3-5 years. If you are 40 years of age or older, have your blood pressure checked every year. You should have your blood pressure measured twice--once when you are at a hospital or clinic, and once when you are not at a hospital or clinic. Record the average of the two measurements. To check your blood pressure when you are not at a hospital or clinic, you can use:  An automated blood pressure machine at a pharmacy.  A home blood pressure monitor.  If you are 45-79 years old, ask your health care provider if you should take aspirin to prevent heart disease.  Diabetes screening involves taking a blood sample to check your fasting blood sugar level. This should be done once every 3 years after age 45 if you are at a normal weight and without risk factors for diabetes. Testing should be considered at a younger age or be carried out more frequently if you are overweight and have at least 1 risk factor for diabetes.  Colorectal cancer can be detected and often prevented. Most routine colorectal cancer screening begins at the age of 50 and continues through age 75. However, your health care provider may recommend screening at an earlier age if you have risk factors for colon cancer. On a yearly basis, your health care provider may provide home test kits to check for hidden blood in the stool. A small camera at the end of a tube may be used to directly examine the colon (sigmoidoscopy or colonoscopy) to detect the earliest forms of colorectal cancer. Talk to your health care provider about this at age 50 when routine screening begins. A direct exam of the colon should be repeated  every 5-10 years through age 75, unless early forms of precancerous polyps or small growths are found.  People who are at an increased risk for hepatitis B should be screened for this virus. You are considered at high risk for hepatitis B if:  You were born in a country where hepatitis B occurs often. Talk with your health care provider about which countries are considered high risk.  Your parents were born in a high-risk country and you have not received a shot to protect against hepatitis B (hepatitis B vaccine).  You have HIV or AIDS.  You use needles to inject street drugs.  You live with, or have sex with, someone who has hepatitis B.  You are a man who has sex with other men (MSM).  You get hemodialysis treatment.  You take certain medicines for conditions like cancer, organ transplantation, and autoimmune conditions.  Hepatitis C blood testing is recommended for   all people born from 1945 through 1965 and any individual with known risk factors for hepatitis C.  Healthy men should no longer receive prostate-specific antigen (PSA) blood tests as part of routine cancer screening. Talk to your health care provider about prostate cancer screening.  Testicular cancer screening is not recommended for adolescents or adult males who have no symptoms. Screening includes self-exam, a health care provider exam, and other screening tests. Consult with your health care provider about any symptoms you have or any concerns you have about testicular cancer.  Practice safe sex. Use condoms and avoid high-risk sexual practices to reduce the spread of sexually transmitted infections (STIs).  You should be screened for STIs, including gonorrhea and chlamydia if:  You are sexually active and are younger than 24 years.  You are older than 24 years, and your health care provider tells you that you are at risk for this type of infection.  Your sexual activity has changed since you were last screened,  and you are at an increased risk for chlamydia or gonorrhea. Ask your health care provider if you are at risk.  If you are at risk of being infected with HIV, it is recommended that you take a prescription medicine daily to prevent HIV infection. This is called pre-exposure prophylaxis (PrEP). You are considered at risk if:  You are a man who has sex with other men (MSM).  You are a heterosexual man who is sexually active with multiple partners.  You take drugs by injection.  You are sexually active with a partner who has HIV.  Talk with your health care provider about whether you are at high risk of being infected with HIV. If you choose to begin PrEP, you should first be tested for HIV. You should then be tested every 3 months for as long as you are taking PrEP.  Use sunscreen. Apply sunscreen liberally and repeatedly throughout the day. You should seek shade when your shadow is shorter than you. Protect yourself by wearing long sleeves, pants, a wide-brimmed hat, and sunglasses year round whenever you are outdoors.  Tell your health care provider of new moles or changes in moles, especially if there is a change in shape or color. Also, tell your health care provider if a mole is larger than the size of a pencil eraser.  A one-time screening for abdominal aortic aneurysm (AAA) and surgical repair of large AAAs by ultrasound is recommended for men aged 65-75 years who are current or former smokers.  Stay current with your vaccines (immunizations).   This information is not intended to replace advice given to you by your health care provider. Make sure you discuss any questions you have with your health care provider.   Document Released: 01/20/2008 Document Revised: 08/14/2014 Document Reviewed: 12/19/2010 Elsevier Interactive Patient Education 2016 [employer]  

## 2016-04-26 DIAGNOSIS — D1801 Hemangioma of skin and subcutaneous tissue: Secondary | ICD-10-CM | POA: Diagnosis not present

## 2016-04-26 DIAGNOSIS — L821 Other seborrheic keratosis: Secondary | ICD-10-CM | POA: Diagnosis not present

## 2016-04-26 DIAGNOSIS — L570 Actinic keratosis: Secondary | ICD-10-CM | POA: Diagnosis not present

## 2016-04-26 DIAGNOSIS — L814 Other melanin hyperpigmentation: Secondary | ICD-10-CM | POA: Diagnosis not present

## 2016-04-26 DIAGNOSIS — D485 Neoplasm of uncertain behavior of skin: Secondary | ICD-10-CM | POA: Diagnosis not present

## 2016-04-26 DIAGNOSIS — D229 Melanocytic nevi, unspecified: Secondary | ICD-10-CM | POA: Diagnosis not present

## 2016-04-27 DIAGNOSIS — L820 Inflamed seborrheic keratosis: Secondary | ICD-10-CM | POA: Diagnosis not present

## 2016-05-04 ENCOUNTER — Encounter: Payer: Self-pay | Admitting: Emergency Medicine

## 2016-05-04 DIAGNOSIS — E668 Other obesity: Secondary | ICD-10-CM | POA: Diagnosis not present

## 2016-05-04 DIAGNOSIS — I480 Paroxysmal atrial fibrillation: Secondary | ICD-10-CM | POA: Diagnosis not present

## 2016-05-04 DIAGNOSIS — I10 Essential (primary) hypertension: Secondary | ICD-10-CM | POA: Diagnosis not present

## 2016-05-04 DIAGNOSIS — I252 Old myocardial infarction: Secondary | ICD-10-CM | POA: Diagnosis not present

## 2016-05-04 DIAGNOSIS — E784 Other hyperlipidemia: Secondary | ICD-10-CM | POA: Diagnosis not present

## 2016-05-04 DIAGNOSIS — I2510 Atherosclerotic heart disease of native coronary artery without angina pectoris: Secondary | ICD-10-CM | POA: Diagnosis not present

## 2016-05-04 DIAGNOSIS — Z951 Presence of aortocoronary bypass graft: Secondary | ICD-10-CM | POA: Diagnosis not present

## 2016-05-04 DIAGNOSIS — I493 Ventricular premature depolarization: Secondary | ICD-10-CM | POA: Diagnosis not present

## 2016-05-04 DIAGNOSIS — R0600 Dyspnea, unspecified: Secondary | ICD-10-CM | POA: Diagnosis not present

## 2016-05-12 LAB — HEPATIC FUNCTION PANEL
ALT: 34 U/L (ref 10–40)
AST: 31 U/L (ref 14–40)
Alkaline Phosphatase: 52 U/L (ref 25–125)

## 2016-05-12 LAB — LIPID PANEL
Cholesterol: 107 mg/dL (ref 0–200)
HDL: 32 mg/dL — AB (ref 35–70)
LDL Cholesterol: 56 mg/dL
Triglycerides: 102 mg/dL (ref 40–160)

## 2016-05-12 LAB — CBC AND DIFFERENTIAL
HCT: 42 % (ref 41–53)
Hemoglobin: 14.3 g/dL (ref 13.5–17.5)
WBC: 4.8 10*3/mL

## 2016-05-12 LAB — TSH: TSH: 2.96 u[IU]/mL (ref 0.41–5.90)

## 2016-05-12 LAB — PSA: PSA: 2.5

## 2016-05-12 LAB — BASIC METABOLIC PANEL
BUN: 26 mg/dL — AB (ref 4–21)
Creatinine: 1 mg/dL (ref 0.6–1.3)
POTASSIUM: 4.5 mmol/L (ref 3.4–5.3)
SODIUM: 140 mmol/L (ref 137–147)

## 2016-05-12 LAB — HEMOGLOBIN A1C: HEMOGLOBIN A1C: 6.7

## 2016-05-15 DIAGNOSIS — Z23 Encounter for immunization: Secondary | ICD-10-CM | POA: Diagnosis not present

## 2016-05-23 ENCOUNTER — Emergency Department (HOSPITAL_COMMUNITY)
Admission: EM | Admit: 2016-05-23 | Discharge: 2016-05-23 | Disposition: A | Discharge status: Home / Self Care | Payer: Medicare Other | Attending: Emergency Medicine | Admitting: Emergency Medicine

## 2016-05-23 ENCOUNTER — Encounter (HOSPITAL_COMMUNITY): Payer: Self-pay

## 2016-05-23 ENCOUNTER — Emergency Department (HOSPITAL_COMMUNITY): Payer: Medicare Other

## 2016-05-23 DIAGNOSIS — Y929 Unspecified place or not applicable: Secondary | ICD-10-CM | POA: Insufficient documentation

## 2016-05-23 DIAGNOSIS — I2510 Atherosclerotic heart disease of native coronary artery without angina pectoris: Secondary | ICD-10-CM | POA: Diagnosis not present

## 2016-05-23 DIAGNOSIS — X500XXA Overexertion from strenuous movement or load, initial encounter: Secondary | ICD-10-CM | POA: Diagnosis not present

## 2016-05-23 DIAGNOSIS — M79602 Pain in left arm: Secondary | ICD-10-CM | POA: Diagnosis not present

## 2016-05-23 DIAGNOSIS — Z79899 Other long term (current) drug therapy: Secondary | ICD-10-CM | POA: Insufficient documentation

## 2016-05-23 DIAGNOSIS — I252 Old myocardial infarction: Secondary | ICD-10-CM | POA: Diagnosis not present

## 2016-05-23 DIAGNOSIS — I509 Heart failure, unspecified: Secondary | ICD-10-CM | POA: Insufficient documentation

## 2016-05-23 DIAGNOSIS — Z87891 Personal history of nicotine dependence: Secondary | ICD-10-CM | POA: Insufficient documentation

## 2016-05-23 DIAGNOSIS — Y999 Unspecified external cause status: Secondary | ICD-10-CM | POA: Diagnosis not present

## 2016-05-23 DIAGNOSIS — Z7982 Long term (current) use of aspirin: Secondary | ICD-10-CM | POA: Insufficient documentation

## 2016-05-23 DIAGNOSIS — E119 Type 2 diabetes mellitus without complications: Secondary | ICD-10-CM | POA: Diagnosis not present

## 2016-05-23 DIAGNOSIS — R079 Chest pain, unspecified: Secondary | ICD-10-CM | POA: Diagnosis not present

## 2016-05-23 DIAGNOSIS — Z951 Presence of aortocoronary bypass graft: Secondary | ICD-10-CM | POA: Diagnosis not present

## 2016-05-23 DIAGNOSIS — Z7984 Long term (current) use of oral hypoglycemic drugs: Secondary | ICD-10-CM | POA: Diagnosis not present

## 2016-05-23 DIAGNOSIS — Y939 Activity, unspecified: Secondary | ICD-10-CM | POA: Insufficient documentation

## 2016-05-23 DIAGNOSIS — I110 Hypertensive heart disease with heart failure: Secondary | ICD-10-CM | POA: Diagnosis not present

## 2016-05-23 LAB — CBC
HCT: 40.7 % (ref 39.0–52.0)
Hemoglobin: 14 g/dL (ref 13.0–17.0)
MCH: 31.9 pg (ref 26.0–34.0)
MCHC: 34.4 g/dL (ref 30.0–36.0)
MCV: 92.7 fL (ref 78.0–100.0)
PLATELETS: 159 10*3/uL (ref 150–400)
RBC: 4.39 MIL/uL (ref 4.22–5.81)
RDW: 12.7 % (ref 11.5–15.5)
WBC: 4.4 10*3/uL (ref 4.0–10.5)

## 2016-05-23 LAB — BASIC METABOLIC PANEL
Anion gap: 7 (ref 5–15)
BUN: 26 mg/dL — ABNORMAL HIGH (ref 6–20)
CALCIUM: 9.9 mg/dL (ref 8.9–10.3)
CO2: 23 mmol/L (ref 22–32)
CREATININE: 1.12 mg/dL (ref 0.61–1.24)
Chloride: 109 mmol/L (ref 101–111)
GFR calc non Af Amer: >60 mL/min (ref >60)
Glucose, Bld: 207 mg/dL — ABNORMAL HIGH (ref 65–99)
Potassium: 4.2 mmol/L (ref 3.5–5.1)
SODIUM: 139 mmol/L (ref 135–145)

## 2016-05-23 LAB — I-STAT TROPONIN, ED
TROPONIN I, POC: 0.01 ng/mL (ref 0.00–0.08)
TROPONIN I, POC: 0.01 ng/mL (ref 0.00–0.08)

## 2016-05-23 NOTE — ED Triage Notes (Signed)
Patient complains of left arm pain x 1 hour. Has cardiac history but denies CP. No other associated symptoms. NAD

## 2016-05-23 NOTE — ED Provider Notes (Signed)
[facility] DEPT Provider Note   CSN: [id number] Arrival date & time: 05/23/16  1308     History   Chief Complaint No chief complaint on file.   HPI Perry [name] is a 70 y.o. male.  HPI Patient resented with left arm pain. Began around 2 hours prior to arrival. It is dull. It is in his left elbow area. States it also goes down to the wrist little bit. No trauma but patient states he had been lifting some heavy things including an 80 pound calf. Has a history of coronary artery disease and MI. Has previous CABG. States none of his demise felt like this. No difficult breathing. No shortness of breath. No diaphoresis. Past Medical History:  Diagnosis Date  . A-fib ([place]) 04/2014  . Abscess of abdominal cavity ([place]) 05/04/2014  . Acute appendicitis with perforation and peritoneal abscess 05/04/2014  . Allergy   . CAD (coronary artery disease)   . CAD (coronary artery disease) of artery bypass graft 05/04/2014  . CHF (congestive heart failure) ([place])    pt not aware  . Diabetes mellitus    not on medication - pt not aware of this  . Dyslipidemia 05/04/2014  . Hearing loss    mild  . Hernia, inguinal    right  . Hyperglycemia   . Hypertension   . Ileus, postoperative ([place]) 05/04/2014  . Lumbar disc disease   . Old inferior wall myocardial infarction 2004    Patient Active Problem List   Diagnosis Date Noted  . Appendicitis 06/24/2014  . CAD (coronary artery disease) of artery bypass graft 05/04/2014  . Dyslipidemia 05/04/2014  . Acute appendicitis with perforation and peritoneal abscess 05/04/2014  . Ileus, postoperative ([place]) 05/04/2014  . Abscess of abdominal cavity ([place]) 05/04/2014  . Atrial fibrillation ([place]) 04/28/2014  . Hyperlipidemia   . Sleep apnea   . CAD (coronary artery disease)   . Hearing loss   . Diabetes mellitus     Past Surgical History:  Procedure Laterality Date  . COLONOSCOPY    . CORONARY ANGIOPLASTY     multiple  . CORONARY ARTERY BYPASS  GRAFT  2004   LIMA to LAD and SVG to RCA (emergent)  . LAPAROSCOPIC APPENDECTOMY N/A 04/21/2014   Procedure: APPENDECTOMY LAPAROSCOPIC;  Surgeon: Rolm Bookbinder, MD;  Location: [facility];  Service: General;  Laterality: N/A;  . LAPAROSCOPIC APPENDECTOMY N/A 06/24/2014   Procedure: APPENDECTOMY LAPAROSCOPIC ;  Surgeon: Rolm Bookbinder, MD;  Location: [facility];  Service: General;  Laterality: N/A;  . LUMBAR LAMINECTOMY  2001   lumbar  . TONSILLECTOMY         Home Medications    Prior to Admission medications   Medication Sig Start Date End Date Taking? Authorizing Provider  aspirin 81 MG chewable tablet Chew 81 mg by mouth daily.   Yes Historical Provider, MD  fenofibrate (TRICOR) 145 MG tablet Take 1 tablet (145 mg total) by mouth daily. 04/12/16  Yes Darlyne Russian, MD  fish oil-omega-3 fatty acids 1000 MG capsule Take 2 g by mouth daily.     Yes Historical Provider, MD  lidocaine (LIDODERM) 5 % Place 1 patch onto the skin daily. Remove & Discard patch within 12 hours or as directed by MD 07/14/14  Yes Veryl Speak, MD  lisinopril (PRINIVIL,ZESTRIL) 40 MG tablet One tablet daily Patient taking differently: Take 40 mg by mouth daily. One tablet daily 04/12/16  Yes Darlyne Russian, MD  metFORMIN (GLUCOPHAGE XR) 500 MG 24 hr  tablet Take one a day for the first week then 2 tablets every morning. Patient taking differently: Take 1,000 mg by mouth every morning. Take one a day for the first week then 2 tablets every morning. 04/14/16  Yes Darlyne Russian, MD  metoprolol succinate (TOPROL-XL) 50 MG 24 hr tablet Take 1 tablet (50 mg total) by mouth daily. 04/12/16  Yes Darlyne Russian, MD  rosuvastatin (CRESTOR) 5 MG tablet Take 1 tablet (5 mg total) by mouth daily. 04/12/16  Yes Darlyne Russian, MD  acetaminophen (TYLENOL) 325 MG tablet Take 2 tablets (650 mg total) by mouth every 4 (four) hours as needed for fever (temp 101). Patient not taking: Reported on 05/23/2016 05/04/14   Earnstine Regal, PA-C  benzonatate  (TESSALON) 100 MG capsule Take 1-2 capsules (100-200 mg total) by mouth 3 (three) times daily as needed for cough. Patient not taking: Reported on 05/23/2016 08/13/15   Darlyne Russian, MD  diclofenac sodium (VOLTAREN) 1 % GEL Apply 2 g topically 3 (three) times daily. Patient not taking: Reported on 05/23/2016 01/05/15   Darlyne Russian, MD  HYDROcodone-homatropine [facility]) 5-1.5 MG/5ML syrup Take 5 mLs by mouth every 8 (eight) hours as needed for cough. Patient not taking: Reported on 05/23/2016 08/13/15   Darlyne Russian, MD  zoster vaccine live, PF, (ZOSTAVAX) 84132 UNT/0.65ML injection Inject 19,400 Units into the skin once. Patient not taking: Reported on 05/23/2016 01/05/15   Darlyne Russian, MD    Family History Family History  Problem Relation Age of Onset  . Heart disease Father   . Heart disease Brother   . Hypertension Brother   . Cancer Sister   . Heart disease Paternal Grandmother     Social History Social History  Substance Use Topics  . Smoking status: Former Smoker    Types: Cigarettes    Quit date: 06/24/1979  . Smokeless tobacco: Former Systems developer    Types: Chew  . Alcohol use 8.4 oz/week    14 Cans of beer per week     Comment: 2 drinks a day     Allergies   Xanax [alprazolam]   Review of Systems Review of Systems  Constitutional: Negative for activity change and appetite change.  HENT: Negative for congestion.   Eyes: Negative for pain.  Respiratory: Negative for chest tightness and shortness of breath.   Cardiovascular: Negative for chest pain and leg swelling.  Gastrointestinal: Negative for abdominal pain, diarrhea, nausea and vomiting.  Genitourinary: Negative for flank pain.  Musculoskeletal: Negative for back pain and neck stiffness.  Skin: Negative for rash.  Neurological: Negative for weakness, numbness and headaches.  Psychiatric/Behavioral: Negative for behavioral problems.     Physical Exam Updated Vital Signs BP 123/84   Pulse (!) 49   Temp 97.5  F (36.4 C) (Oral)   Resp 17   Ht '6\' 1"'$  (1.854 m)   Wt 248 lb (112.5 kg)   SpO2 95%   BMI 32.72 kg/m   Physical Exam  Constitutional: He appears well-developed.  HENT:  Head: Atraumatic.  Eyes: EOM are normal.  Neck: Neck supple.  Cardiovascular: Normal rate.   Pulmonary/Chest: Effort normal.  Abdominal: There is no tenderness.  Musculoskeletal: He exhibits tenderness.  Mild tenderness over right elbow. Particularly with valgus strain  Neurological: He is alert.  Skin: Skin is warm.  Psychiatric: He has a normal mood and affect.     ED Treatments / Results  Labs (all labs ordered are listed, but only abnormal  results are displayed) Labs Reviewed  BASIC METABOLIC PANEL - Abnormal; Notable for the following:       Result Value   Glucose, Bld 207 (*)    BUN 26 (*)    All other components within normal limits  CBC  I-STAT TROPOININ, ED  I-STAT TROPOININ, ED    EKG  EKG Interpretation  Date/Time:  Tuesday May 23 2016 08:40:40 EDT Ventricular Rate:  58 PR Interval:  216 QRS Duration: 116 QT Interval:  464 QTC Calculation: 455 R Axis:   -31 Text Interpretation:  Sinus bradycardia with 1st degree A-V block Left axis deviation Left ventricular hypertrophy with QRS widening Inferior infarct , age undetermined Abnormal ECG No significant change since last tracing Confirmed by Alvino Chapel  MD, Ovid Curd [phone]) on 05/23/2016 9:07:56 AM       Radiology Dg Chest 2 View  Result Date: 05/23/2016 CLINICAL DATA:  70 year old male with left arm pain and chest pain today. Initial encounter. EXAM: CHEST  2 VIEW COMPARISON:  04/14/2016 and earlier. FINDINGS: Sequelae of CABG. Stable cardiac size and mediastinal contours. Mildly tortuous thoracic aorta. Lung volumes are stable and within normal limits. The lungs are clear. No pneumothorax or pleural effusion. Chronic left lateral seventh and eighth rib fractures again noted. No acute osseous abnormality identified. IMPRESSION: No  acute cardiopulmonary abnormality. Electronically Signed   By: Genevie Ann M.D.   On: 05/23/2016 09:22    Procedures Procedures (including critical care time)  Medications Ordered in ED Medications - No data to display   Initial Impression / Assessment and Plan / ED Course  I have reviewed the triage vital signs and the nursing notes.  Pertinent labs & imaging results that were available during my care of the patient were reviewed by me and considered in my medical decision making (see chart for details).  Clinical Course    Patient with left arm pain. Likely muscle skeletal. Cardiac felt less likely. Enzymes negative 2. Does not feel like his previous cardiac causes. EKG reassuring. Discharge home and follow-up as needed.  Final Clinical Impressions(s) / ED Diagnoses   Final diagnoses:  Left arm pain    New Prescriptions New Prescriptions   No medications on file     Davonna Belling, MD 05/23/16 1224

## 2016-05-23 NOTE — ED Notes (Signed)
Pt is in stable condition upon d/c and ambulates from ED. 

## 2016-06-09 DIAGNOSIS — S9032XA Contusion of left foot, initial encounter: Secondary | ICD-10-CM | POA: Diagnosis not present

## 2016-06-21 ENCOUNTER — Telehealth: Payer: Self-pay | Admitting: Neurology

## 2016-06-22 ENCOUNTER — Encounter: Payer: Self-pay | Admitting: Family Medicine

## 2016-06-22 ENCOUNTER — Ambulatory Visit (INDEPENDENT_AMBULATORY_CARE_PROVIDER_SITE_OTHER): Payer: Medicare Other | Admitting: Family Medicine

## 2016-06-22 ENCOUNTER — Ambulatory Visit: Payer: Medicare Other | Admitting: Family Medicine

## 2016-06-22 VITALS — BP 135/82 | HR 62 | Temp 98.0°F | Resp 18 | Ht 72.5 in | Wt 249.4 lb

## 2016-06-22 DIAGNOSIS — I10 Essential (primary) hypertension: Secondary | ICD-10-CM | POA: Diagnosis not present

## 2016-06-22 DIAGNOSIS — E119 Type 2 diabetes mellitus without complications: Secondary | ICD-10-CM | POA: Diagnosis not present

## 2016-06-22 DIAGNOSIS — E782 Mixed hyperlipidemia: Secondary | ICD-10-CM

## 2016-06-22 DIAGNOSIS — Z2911 Encounter for prophylactic immunotherapy for respiratory syncytial virus (RSV): Secondary | ICD-10-CM

## 2016-06-22 DIAGNOSIS — Z23 Encounter for immunization: Secondary | ICD-10-CM

## 2016-06-22 DIAGNOSIS — I25700 Atherosclerosis of coronary artery bypass graft(s), unspecified, with unstable angina pectoris: Secondary | ICD-10-CM

## 2016-06-22 NOTE — Patient Instructions (Addendum)
We will give you your shingles vaccine today.  You only need to get this shot one time Please do see Dr. Brett Fairy for your sleep apnea.    Please do get your eyes checked in the next few months- it is important to have this done annually for all diabetics.    Your A1c from October looks better!  Please come and see me in February or March to check your A1c. I think the metformin is already starting to improve your glucose control  It was very nice to meet you today and I look forward to working with you in the future!  Happy holidays

## 2016-06-22 NOTE — Progress Notes (Signed)
[facility] [address] [place], [place] [address] [phone]  Date:  06/22/2016   Name:  Perry [name]   DOB:  October 02, 1945   MRN:  [id number]  PCP:  Lamar Blinks, MD    Chief Complaint: Establish Care   History of Present Illness:  Perry [name] is a 70 y.o. very pleasant male patient who presents with the following:  Here today to establish care and discuss his chronic health concerns- he has a history of CAD/ CABG in 004, DM, a fib, CAD, dyslipidemia  He is a former pt of Dr. Everlene Farrier- he started metformin in September of this year as his A1c got over 7%.  Lab Results  Component Value Date   HGBA1C 7.3 04/14/2016   His cardiologist is Dr. Wynonia Lawman- last visit with him was in approx 2 months. He follows up every 6 months.   He does not have any CP.  He had a negative stress a couple of months ago.   Flu shot: done for this year Eye exam: not done recently- he states it has been several years.   Foot exam: due  He has not yet had a shingles vaccine. He would like to get this done today in the office He is a Regulatory affairs officer, he keeps some beef cattle.   He did have a burst appendix back in 2015- he has recovered from this illness He had atrial fib while he was in the hospital for his appendix, but this has not been a problem since. He does have OSA, but does not use his machine as it is not comfortable for him.  It caused a nose bleed- however he plans to re-visit this issue with Dr. Brett Fairy and hopefully get a new type of device.    He does notice that he may feel SOB with exercise, but this is stable and has not changed at all. He does not have any CP  He is actually taking toprol 25 mg (1/2 tablet) daily  Patient Active Problem List   Diagnosis Date Noted  . Appendicitis 06/24/2014  . CAD (coronary artery disease) of artery bypass graft 05/04/2014  . Dyslipidemia 05/04/2014  . Acute  appendicitis with perforation and peritoneal abscess 05/04/2014  . Ileus, postoperative ([place]) 05/04/2014  . Abscess of abdominal cavity ([place]) 05/04/2014  . Atrial fibrillation ([place]) 04/28/2014  . Hyperlipidemia   . Sleep apnea   . CAD (coronary artery disease)   . Hearing loss   . Diabetes mellitus     Past Medical History:  Diagnosis Date  . A-fib ([place]) 04/2014  . Abscess of abdominal cavity ([place]) 05/04/2014  . Acute appendicitis with perforation and peritoneal abscess 05/04/2014  . Allergy   . CAD (coronary artery disease)   . CAD (coronary artery disease) of artery bypass graft 05/04/2014  . CHF (congestive heart failure) ([place])    pt not aware  . Diabetes mellitus    not on medication - pt not aware of this  . Dyslipidemia 05/04/2014  . Hearing loss    mild  . Hernia, inguinal    right  . Hyperglycemia   . Hypertension   . Ileus, postoperative ([place]) 05/04/2014  . Lumbar disc disease   . Old inferior wall myocardial infarction 2004    Past Surgical History:  Procedure Laterality Date  . COLONOSCOPY    . CORONARY ANGIOPLASTY     multiple  . CORONARY  ARTERY BYPASS GRAFT  2004   LIMA to LAD and SVG to RCA (emergent)  . LAPAROSCOPIC APPENDECTOMY N/A 04/21/2014   Procedure: APPENDECTOMY LAPAROSCOPIC;  Surgeon: Rolm Bookbinder, MD;  Location: [facility];  Service: General;  Laterality: N/A;  . LAPAROSCOPIC APPENDECTOMY N/A 06/24/2014   Procedure: APPENDECTOMY LAPAROSCOPIC ;  Surgeon: Rolm Bookbinder, MD;  Location: [facility];  Service: General;  Laterality: N/A;  . LUMBAR LAMINECTOMY  2001   lumbar  . TONSILLECTOMY      Social History  Substance Use Topics  . Smoking status: Former Smoker    Types: Cigarettes    Quit date: 06/24/1979  . Smokeless tobacco: Former Systems developer    Types: Chew  . Alcohol use 8.4 oz/week    14 Cans of beer per week     Comment: 2 drinks a day    Family History  Problem Relation Age of Onset  . Heart disease Father   . Heart disease Brother   .  Hypertension Brother   . Cancer Sister   . Heart disease Paternal Grandmother     Allergies  Allergen Reactions  . Xanax [Alprazolam] Shortness Of Breath    Medication list has been reviewed and updated.  Current Outpatient Prescriptions on File Prior to Visit  Medication Sig Dispense Refill  . acetaminophen (TYLENOL) 325 MG tablet Take 2 tablets (650 mg total) by mouth every 4 (four) hours as needed for fever (temp 101).    Marland Kitchen aspirin 81 MG chewable tablet Chew 81 mg by mouth daily.    . benzonatate (TESSALON) 100 MG capsule Take 1-2 capsules (100-200 mg total) by mouth 3 (three) times daily as needed for cough. 40 capsule 0  . fenofibrate (TRICOR) 145 MG tablet Take 1 tablet (145 mg total) by mouth daily. 90 tablet 3  . fish oil-omega-3 fatty acids 1000 MG capsule Take 2 g by mouth daily.      Marland Kitchen lisinopril (PRINIVIL,ZESTRIL) 40 MG tablet One tablet daily (Patient taking differently: Take 40 mg by mouth daily. One tablet daily) 90 tablet 3  . metFORMIN (GLUCOPHAGE XR) 500 MG 24 hr tablet Take one a day for the first week then 2 tablets every morning. (Patient taking differently: Take 1,000 mg by mouth every morning. Take one a day for the first week then 2 tablets every morning.) 60 tablet 11  . metoprolol succinate (TOPROL-XL) 50 MG 24 hr tablet Take 1 tablet (50 mg total) by mouth daily. 90 tablet 90  . rosuvastatin (CRESTOR) 5 MG tablet Take 1 tablet (5 mg total) by mouth daily. 90 tablet 3   No current facility-administered medications on file prior to visit.     Review of Systems:  As per HPI- otherwise negative.  No fever, chills, nausea, vomiting, chest pain, vision change   Physical Examination: Vitals:   06/22/16 0825  BP: 135/82  Pulse: 62  Resp: 18  Temp: 98 F (36.7 C)   Vitals:   06/22/16 0825  Weight: 249 lb 6.4 oz (113.1 kg)  Height: 6' 0.5" (1.842 m)   Body mass index is 33.36 kg/m. Ideal Body Weight: Weight in (lb) to have BMI = 25: 186.5  GEN: WDWN,  NAD, Non-toxic, A & O x 3, overweight, otherwise looks well HEENT: Atraumatic, Normocephalic. Neck supple. No masses, No LAD.  Bilateral TM wnl, oropharynx normal.  PEERL,EOMI.   Ears and Nose: No external deformity. CV: RRR, No M/G/R. No JVD. No thrill. No extra heart sounds. PULM: CTA B, no wheezes, crackles,  rhonchi. No retractions. No resp. distress. No accessory muscle use. EXTR: No c/c/e NEURO Normal gait.  PSYCH: Normally interactive. Conversant. Not depressed or anxious appearing.  Calm demeanor.    Assessment and Plan: Type 2 diabetes mellitus without complication, without long-term current use of insulin (HCC)  Immunization due - Plan: Varicella-zoster vaccine subcutaneous  Coronary artery disease involving coronary bypass graft of native heart with unstable angina pectoris ([place])  Essential hypertension, benign  Mixed hyperlipidemia  Here today to establish care and discuss a few issues He brings in labs from work- his A1c from last month is already showing improvement since he added metformin. Continue this medication and plan to recheck in 3-4 months zostavax today He does not have any CP.  He may get SOB with exertion but this is not new, not changing.  He had a recent cardiac stress that was benign  Signed Lamar Blinks, MD

## 2016-06-22 NOTE — Telephone Encounter (Signed)
Wife requested follow up visit for CPAP.

## 2016-06-22 NOTE — Progress Notes (Signed)
Pre visit review using our clinic review tool, if applicable. No additional management support is needed unless otherwise documented below in the visit note. 

## 2016-06-22 NOTE — Telephone Encounter (Signed)
I called pt to discuss. No answer, left a message asking him to call me back. 

## 2016-06-26 NOTE — Telephone Encounter (Signed)
Pt called me back, he needs to r/s his appt on 06/28/2016. New appt made for 07/03/16 at 10:00am. Pt verbalized understanding of new appt, date, and time.

## 2016-06-26 NOTE — Telephone Encounter (Signed)
Pt returned call

## 2016-06-26 NOTE — Telephone Encounter (Signed)
I called pt. He says that he is not using cpap any longer and needs to discuss other options. I made pt an appt for 06/28/16 at 8:00am. Pt will call back and r/s if he cannot make that appt. Pt verbalized understanding of appt date and time.

## 2016-06-27 ENCOUNTER — Encounter: Payer: Self-pay | Admitting: Family Medicine

## 2016-06-27 NOTE — Progress Notes (Unsigned)
Pre visit review using our clinic review tool, if applicable. No additional management support is needed unless otherwise documented below in the visit note. 

## 2016-06-28 ENCOUNTER — Ambulatory Visit: Payer: Self-pay | Admitting: Neurology

## 2016-07-03 ENCOUNTER — Ambulatory Visit: Payer: Self-pay | Admitting: Neurology

## 2016-07-03 ENCOUNTER — Telehealth: Payer: Self-pay

## 2016-07-03 NOTE — Telephone Encounter (Signed)
Pt did not show for their appt with Dr. Dohmeier today.  

## 2016-07-04 ENCOUNTER — Encounter: Payer: Self-pay | Admitting: Neurology

## 2016-07-25 ENCOUNTER — Ambulatory Visit (INDEPENDENT_AMBULATORY_CARE_PROVIDER_SITE_OTHER): Payer: Medicare Other | Admitting: Neurology

## 2016-07-25 ENCOUNTER — Encounter: Payer: Self-pay | Admitting: Neurology

## 2016-07-25 VITALS — BP 140/72 | HR 52 | Resp 18 | Ht 72.5 in | Wt 249.0 lb

## 2016-07-25 DIAGNOSIS — G4733 Obstructive sleep apnea (adult) (pediatric): Secondary | ICD-10-CM

## 2016-07-25 DIAGNOSIS — I25700 Atherosclerosis of coronary artery bypass graft(s), unspecified, with unstable angina pectoris: Secondary | ICD-10-CM | POA: Diagnosis not present

## 2016-07-25 DIAGNOSIS — Z789 Other specified health status: Secondary | ICD-10-CM | POA: Diagnosis not present

## 2016-07-25 NOTE — Progress Notes (Signed)
SLEEP MEDICINE CLINIC   Provider:  Larey Seat, M D  Referring Provider: Darreld Mclean, MD Primary Care Physician:  Lamar Blinks, MD  Chief Complaint  Patient presents with  . Follow-up    Rm 10. Patient is here wot discuss OSA treatment.     HPI:  Perry [name] is a 70 y.o. male , seen here as a revisit , having been seen last in 03-12-2014 for PSG. Formerly Dr. Everlene Farrier, now with Dr. Lorelei Pont   Chief complaint according to patient :" I fell asleep at the stop light" and " I can't get a good nights rest "    Recently seen Mrs. Maxwell Caul, who told me that her husband has still grades difficulties to find restful and restorative sleep. He wakes frequently up during the night which she confirmed today he will play a game of solitaire on his smart phone before he goes to bed, but usually he cannot stay asleep for long periods of time when I saw him last 2-1/2 years ago his chief complaint was that he choked also a lot at night. The sleep choking episodes have stopped. However he does not sleeps through and he has chronic problems with sleep fragmentation and sleep initiation. As a result he has become also excessively daytime sleepy. He underwent a split-night polysomnography on 03/12/2014 given his past medical history of coronary artery disease, status post open heart surgery CABG, hypertension diabetes mellitus, myocardial infarction, inguinal hernia, hearing loss and moderate obesity. He was diagnosed with an AHI of 24.2 in supine sleep his apnea exacerbated 272 per hour of sleep, he did not enter REM sleep at all. Oxygen nadir was 82% but the total time of desaturation was only 12 minutes. He had few periodic limb movements, he was titrated to 9 cm water pressure CPAP but he stated that he could not tolerate CPAP at all he developed nosebleeds, on a FFM  Airfit F 10- nosebleeding continued.  He stopped using CPAP. He had service through [facility].  He returned the machine. He wants not to  sleep on his back. He continues  To snore, to have apnea and sometimes has nosebleeds, rhinitis. His Epworth score is 16. He naps every afternoon at his office, still working. He still works full-time in his own company, has 94 employees. 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 [employer].  He started on metformin. He noted a low heart rate.   Sleep habits are as follows: He is to bed between 8:30 and 9 PM, usually is asleep promptly by 9:30. He has a habit to play a game on his smart phone before falling asleep. Once asleep he will only sleep for about 3 or 4 hours. He wakes up spontaneously there is no external trigger. He usually can fall asleep easily again, he rises at 4 AM. He goes to  the office by about 5 AM. He leaves the office usually at about 3 PM after 10 hours, stops by the cows for 2 hours.  He avoids sleeping on his back, he has acoll, quiet and dark bedroom. No nocturia.   Sleep medical history and family sleep history:  See initial consult.  Social history: married, self employed.  Caffeine- one cup coffee daily/ ETOH 1-2 drinks every night/ no  Tobacco use., quit 40  years ago.   Review of Systems: Out of a complete 14 system review, the patient complains of only the following symptoms, and all other reviewed systems are  negative.  see above   Epworth score 16 ,  Fatigue severity score 43 ,  depression score 2/15   Social History   Social History  . Marital status: Married    Spouse name: [name]  . Number of children: 2  . Years of education: Masters   Occupational History  . General Contractor    Social History Main Topics  . Smoking status: Former Smoker    Types: Cigarettes    Quit date: 06/24/1979  . Smokeless tobacco: Former Systems developer    Types: Chew  . Alcohol use 8.4 oz/week    14 Cans of beer per week     Comment: 2 drinks a day  . Drug use: No  . Sexual activity: Not on file   Other Topics Concern  . Not on file   Social History  Narrative   Patient is married [name]) and lives at home with his wife.   Patient has two adult children.   Patient is working full-time.   Patient has a Master's degree.   Patient drinks one cup of coffee every morning.   Patient is right-handed.    Family History  Problem Relation Age of Onset  . Heart disease Father   . Heart disease Brother   . Hypertension Brother   . Cancer Sister   . Heart disease Paternal Grandmother     Past Medical History:  Diagnosis Date  . A-fib ([place]) 04/2014  . Abscess of abdominal cavity ([place]) 05/04/2014  . Acute appendicitis with perforation and peritoneal abscess 05/04/2014  . Allergy   . CAD (coronary artery disease)   . CAD (coronary artery disease) of artery bypass graft 05/04/2014  . CHF (congestive heart failure) ([place])    pt not aware  . Diabetes mellitus    not on medication - pt not aware of this  . Dyslipidemia 05/04/2014  . Hearing loss    mild  . Hernia, inguinal    right  . Hyperglycemia   . Hypertension   . Ileus, postoperative ([place]) 05/04/2014  . Lumbar disc disease   . Old inferior wall myocardial infarction 2004    Past Surgical History:  Procedure Laterality Date  . COLONOSCOPY    . CORONARY ANGIOPLASTY     multiple  . CORONARY ARTERY BYPASS GRAFT  2004   LIMA to LAD and SVG to RCA (emergent)  . LAPAROSCOPIC APPENDECTOMY N/A 04/21/2014   Procedure: APPENDECTOMY LAPAROSCOPIC;  Surgeon: Rolm Bookbinder, MD;  Location: [place];  Service: General;  Laterality: N/A;  . LAPAROSCOPIC APPENDECTOMY N/A 06/24/2014   Procedure: APPENDECTOMY LAPAROSCOPIC ;  Surgeon: Rolm Bookbinder, MD;  Location: [place];  Service: General;  Laterality: N/A;  . LUMBAR LAMINECTOMY  2001   lumbar  . TONSILLECTOMY      Current Outpatient Prescriptions  Medication Sig Dispense Refill  . aspirin 81 MG chewable tablet Chew 81 mg by mouth daily.    . fenofibrate (TRICOR) 145 MG tablet Take 1 tablet (145 mg total) by mouth daily. 90 tablet 3  . fish  oil-omega-3 fatty acids 1000 MG capsule Take 2 g by mouth daily.      Marland Kitchen lisinopril (PRINIVIL,ZESTRIL) 40 MG tablet One tablet daily (Patient taking differently: Take 40 mg by mouth daily. One tablet daily) 90 tablet 3  . metFORMIN (GLUCOPHAGE XR) 500 MG 24 hr tablet Take one a day for the first week then 2 tablets every morning. (Patient taking differently: Take 1,000 mg by mouth every morning. Take one a  day for the first week then 2 tablets every morning.) 60 tablet 11  . metoprolol succinate (TOPROL-XL) 50 MG 24 hr tablet Take 1 tablet (50 mg total) by mouth daily. 90 tablet 90  . rosuvastatin (CRESTOR) 5 MG tablet Take 1 tablet (5 mg total) by mouth daily. 90 tablet 3   No current facility-administered medications for this visit.     Allergies as of 07/25/2016 - Review Complete 07/25/2016  Allergen Reaction Noted  . Xanax [alprazolam] Shortness Of Breath 04/20/2014    Vitals: BP 140/72   Pulse (!) 52   Resp 18   Ht 6' 0.5" (1.842 m)   Wt 249 lb (112.9 kg)   BMI 33.31 kg/m  Last Weight:  Wt Readings from Last 1 Encounters:  07/25/16 249 lb (112.9 kg)   VVO:HYWV mass index is 33.31 kg/m.     Last Height:   Ht Readings from Last 1 Encounters:  07/25/16 6' 0.5" (1.842 m)    Physical exam:  General: The patient is awake, alert and appears not in acute distress. The patient is well groomed. Head: Normocephalic, atraumatic. Neck is supple. Mallampati 5 , congested nose,   neck circumference:19.5*. Nasal airflow Cardiovascular:  Regular rate and rhythm, without  murmurs or carotid bruit, and without distended neck veins. Respiratory: Lungs are clear to auscultation. Skin:  Without evidence of edema, or rash Trunk: The patient's posture is erect  Neurologic exam : The patient is awake and alert, oriented to place and time.     Attention span & concentration ability appears normal.  Speech is fluent,  without  dysarthria, dysphonia or aphasia.  Mood and affect are  appropriate.  Cranial nerves: Pupils are equal and briskly reactive to light. Funduscopic exam without evidence of pallor or edema.  Extraocular movements  in vertical and horizontal planes intact and without nystagmus. Visual fields by finger perimetry are intact. Hearing to finger rub intact.  Facial sensation intact to fine touch. Facial motor strength is symmetric and tongue and uvula move midline. Shoulder shrug was symmetrical.  Motor exam: Normal tone, muscle bulk and symmetric strength in all extremities. Sensory:  Fine touch, pinprick and vibration were tested in all extremities.  Coordination:  Finger-to-nose maneuver  normal without evidence of ataxia, dysmetria or tremor. Gait and station: Patient walks without assistive device and is able unassisted to climb up to the exam table. Strength within normal limits.  Stance is stable and normal.  Deep tendon reflexes: in the  upper and lower extremities are symmetric and intact. Babinski maneuver response is  downgoing.  The patient was advised of the nature of the diagnosed sleep disorder , the treatment options and risks for general a health and wellness arising from not treating the condition.  I spent more than 40 minutes of face to face time with the patient. Greater than 50% of time was spent in counseling and coordination of care. We have discussed the diagnosis and differential and I answered the patient's questions.     Assessment:  After physical and neurologic examination, review of laboratory studies,  Personal review of imaging studies, reports of other /same  Imaging studies ,  Results of polysomnography/ neurophysiology testing and pre-existing records as far as provided in visit., my assessment is   1)  Mr. Carton has become excessively daytime sleepy and I strongly recommend treating his obstructive sleep apnea. Over the last 2-1/2 years there has been increase in daytime sleepiness and probably some weight gain as well. It  is likely that he has more apnea of any have been, with an AHI of 24.2, supine AHI of 72. He instinctivly avoids sleeping on his back, and was surrendered to dose so by a full face mask. He developed nosebleeds on CPAP and is not keen on returning to this method of treatment. I would like to refer him for a dental device. I think he has a better chance of reducing his apnea index, likely he will not have complete litigation but certainly there could see an improvement and reduction in apnea. I will make a referral to Dr. Augustina Mood, DDS. I will order a home sleep test for him to confirm his current apnea degree.  Plan:  Treatment plan and additional workup :  HST, self pay.  Rv after HST - referral to DDS      Larey Seat MD  07/25/2016   CC: Darreld Mclean, [address],  [id number]

## 2016-08-01 ENCOUNTER — Telehealth: Payer: Self-pay | Admitting: Neurology

## 2016-08-01 DIAGNOSIS — G4733 Obstructive sleep apnea (adult) (pediatric): Secondary | ICD-10-CM

## 2016-08-01 DIAGNOSIS — R040 Epistaxis: Secondary | ICD-10-CM

## 2016-08-01 DIAGNOSIS — Z789 Other specified health status: Secondary | ICD-10-CM

## 2016-08-01 NOTE — Procedures (Signed)
[facility] [address] [place], [place] [address] NAME: Perry [name] DOB: 1946/06/25 MEDICAL RECORD [id number] DOS:  07/25/16 REFERRING PHYSICIAN: Janett Billow Copeland,MD  STUDY PERFORMED:  HST/ Out of Center Sleep Test  HISTORY:  [name] is a 70 year old male patient, seen here last in 03-12-2014 for PSG. Tried CPAP but couldn't tolerate it, now interested in a dental device if apnea is still present. Chief complaint:" I fell asleep at the stop light" and " I can't get a good night's rest" He underwent a split-night polysomnography on 03/12/2014 - medical history of coronary artery disease, status post open heart surgery CABG, hypertension diabetes mellitus, myocardial infarction, inguinal hernia, hearing loss and moderate obesity. He was diagnosed with an AHI of 24.2 in supine sleep his apnea exacerbated 272 per hour of sleep, he did not enter REM sleep at all. Oxygen nadir was 82% but the total time of desaturation was only 12 minutes. He had few periodic limb movements, he was titrated to 9 cm water pressure CPAP but he stated that he could not tolerate CPAP at all he developed nosebleeds on nasal pillow but on a FFM Airfit F 10 epistaxis continued. He stopped using CPAP. He had service through [facility].  He returned the machine. He wants not to sleep on his back.   He wakes frequently up during the night - he cannot stay asleep for long periods. In 2015,he choked often at night. The sleep choking episodes have stopped. However he does not sleep through and he has chronic problems with sleep fragmentation and sleep initiation. He Snores loudly, has witnessed  apnea and sometimes has still nosebleeds, rhinitis. His Epworth score was endorsed at 16/ 24 points.   STUDY RESULTS: Total Recording Time:  7h 85mTotal Apnea/Hypopnea Index (AHI) 4.5 Average Oxygen Desaturation: S pO2 92 % Lowest Oxygen desaturation: SpO2 81%, hypoxemia  of 23 minutes only at  or below 89%  IMPRESSION: Very mild apnea, no significant hypoxemia. No evidence of tachy-brady arrhythmia. RDI was 8.9, indicating snoring.   RECOMMENDATION: A dental device and weight loss are recommended.  BMI is 33. This form of apnea will likely improve with a dental device. I certify that I have reviewed the raw data recording prior to the issuance of this report in accordance with the standards of Accreditation of the American Academy of Sleep medicine (AASM) CLarey Seat MD   08-01-2016  Diplomat, American Board of Psychiatry and Neurology, Diplomat of the  [employer] Sleep [employer], and  MMarket researcherof [employer]Sleep at [employer] an [place]accredited facility.

## 2016-08-01 NOTE — Telephone Encounter (Signed)
[facility] [address], [place], [place] [address] NAME: Perry [name] DOB: 1945/12/03 MEDICAL RECORD [id number] DOS:  07/25/16 REFERRING PHYSICIAN: Janett Billow Copeland,MD  STUDY PERFORMED:  HST/ Out of Center Sleep Test  HISTORY:  [name] is a 70 year old male patient, seen here last in 03-12-2014 for PSG. Tried CPAP but couldn't tolerate it, now interested in a dental device if apnea is still present. Chief complaint:" I fell asleep at the stop light" and " I can't get a good night's rest" He underwent a split-night polysomnography on 03/12/2014 - medical history of coronary artery disease, status post open heart surgery CABG, hypertension diabetes mellitus, myocardial infarction, inguinal hernia, hearing loss and moderate obesity. He was diagnosed with an AHI of 24.2 in supine sleep his apnea exacerbated 272 per hour of sleep, he did not enter REM sleep at all. Oxygen nadir was 82% but the total time of desaturation was only 12 minutes. He had few periodic limb movements, he was titrated to 9 cm water pressure CPAP but he stated that he could not tolerate CPAP at all he developed nosebleeds on nasal pillow but on a FFM Airfit F 10 epistaxis continued. He stopped using CPAP. He had service through [facility].  He returned the machine. He wants not to sleep on his back.   He wakes frequently up during the night - he cannot stay asleep for long periods. In 2015,he choked often at night. The sleep choking episodes have stopped. However he does not sleep through and he has chronic problems with sleep fragmentation and sleep initiation. He Snores loudly, has witnessed  apnea and sometimes has still nosebleeds, rhinitis. His Epworth score was endorsed at 16/ 24 points.   STUDY RESULTS: Total Recording Time:  7h 21mTotal Apnea/Hypopnea Index (AHI) 4.5 Average Oxygen Desaturation: S pO2 92 % Lowest Oxygen desaturation: SpO2 81%, hypoxemia  of 23 minutes only at  or below 89%  IMPRESSION: Very mild apnea, no significant hypoxemia. No evidence of tachy-brady arrhythmia. RDI was 8.9, indicating snoring.   RECOMMENDATION: A dental device and weight loss are recommended.  BMI is 33. This form of apnea will likely improve with a dental device. I certify that I have reviewed the raw data recording prior to the issuance of this report in accordance with the standards of Accreditation of the American Academy of Sleep medicine (AASM) CLarey Seat MD   08-01-2016  Diplomat, American Board of Psychiatry and Neurology, Diplomat of the  [employer] Sleep Medicine, and  MMarket researcherof [employer] at [employer] an A[place]endaaccredited facility.

## 2016-08-02 NOTE — Telephone Encounter (Signed)
I spoke to the patient and he asks to speak with Dr. Brett Fairy. He asks why there would be such a huge difference in AHI for in lab study versus the HST? He is agreeable to dental device but states that he is really concerned about his sleep and the effect that it has on his health.

## 2016-08-02 NOTE — Telephone Encounter (Signed)
-----   Message from Larey Seat, MD sent at 08/01/2016  1:02 PM EST ----- Made dental referral

## 2016-08-04 NOTE — Telephone Encounter (Signed)
Perry [name] felt he didn't sleep a wink while using the HST, He would .ike to repeat the test and I offered him at no costs. Can you contact him to have him pick up device next week? CD

## 2016-08-10 NOTE — Telephone Encounter (Signed)
Perry [name] is working with this pt to get his HST. Pt will call next week when he wants to pick it up, per Robin.

## 2016-09-15 ENCOUNTER — Telehealth: Payer: Self-pay

## 2016-09-15 NOTE — Telephone Encounter (Signed)
Kristen from [facility] states pt advise her the DR told him to take only half a tablet of Metoprolol instead of a whole.   She request to have a new Rx order sent over.

## 2016-09-16 NOTE — Telephone Encounter (Signed)
Clarified with patient message should have been sent to Cardiology and not PCP He will call pharmacy for the confusion

## 2016-10-11 ENCOUNTER — Encounter: Payer: Self-pay | Admitting: Neurology

## 2016-10-11 DIAGNOSIS — G4733 Obstructive sleep apnea (adult) (pediatric): Secondary | ICD-10-CM

## 2016-10-13 ENCOUNTER — Telehealth: Payer: Self-pay | Admitting: Neurology

## 2016-10-13 DIAGNOSIS — G4733 Obstructive sleep apnea (adult) (pediatric): Secondary | ICD-10-CM

## 2016-10-13 DIAGNOSIS — G4734 Idiopathic sleep related nonobstructive alveolar hypoventilation: Secondary | ICD-10-CM

## 2016-10-13 NOTE — Telephone Encounter (Signed)
[facility]  [facility] [address] [place], [place] [address] NAME: Perry [name] DOB: May 20, 1946 MEDICAL RECORD [id number] DOS:  repeat study  10-11-2016 REFERRING PHYSICIAN: Silvestre Mesi, MD  STUDY PERFORMED:  HST/ Out of Center Sleep Test  HISTORY:  Perry [name] is a 71 year old male patient, seen here last in 03-12-2014 for PSG. Tried CPAP but couldn't tolerate it, now interested in a dental device if apnea is still present. Chief complaint:" I fell asleep at the stop light" and " I can't get a good night's rest" He underwent a split-night polysomnography on 03/12/2014 - medical history of coronary artery disease, status post open heart surgery CABG, hypertension diabetes mellitus, myocardial infarction, inguinal hernia, hearing loss and moderate obesity. He was diagnosed with an AHI of 24.2,  in supine sleep his apnea exacerbated  to 72 AHI. Oxygen nadir was 82% but the total time of desaturation was only 12 minutes. He had few periodic limb movements, he was titrated to 9 cm water pressure CPAP but he  His Epworth score was endorsed at 16/ 24 points.  Previous HST from 07-23-2016 was unremarkable - only mild apnea.   STUDY RESULTS: Total Recording Time:  8 hours and 13 minutes. Total Apnea/Hypopnea Index (AHI) 8.7 and Average Oxygen Desaturation: SpO2 91 % Lowest Oxygen desaturation: SpO2 80%, hypoxemia of 80 minutes only at or below 89% and 41 minutes of or below  88% SpO2.  IMPRESSION: Mild apnea, this time with significant hypoxemia.  No evidence of tachy-brady arrhythmia.  Snoring.   RECOMMENDATION: Hypersomnia of Epworth 16 in context with clinically significant  hypoxemia and mild apnea should be treated with CPAP.  Place auto PAP order of 5-15 cm water.  I certify that I have reviewed the raw data recording prior to the issuance of this report in accordance with the standards of Accreditation of the American Academy of Sleep medicine  (AASM) Larey Seat, MD   10-13-2016 Diplomat, American Board of Psychiatry and Neurology, Diplomat of the [employer] of Sleep Medicine, and  Market researcher of [employer] [facility] at [employer], an AASM accredited facility

## 2016-10-17 NOTE — Telephone Encounter (Signed)
I called Mr. Dissinger, but he was travelling through a spot of bad reception. He will call me back tomorrow.  My message is that his apnea is mildish but associated with hypoxemia. This setting benefits more form CPAP than dental device, but a dental device can still be the only option for a CPAP" intolerant" patient.   We did not bill for this HST, as his last HST was likely invalid- he had reported not to have slept.   CD

## 2016-10-17 NOTE — Telephone Encounter (Signed)
I called pt. I spent an extended time explaining his sleep study results and that his latest HST showed mild apnea with significant hypoxemia and that Dr. Brett Fairy recommends treating this with an auto pap.  Pt says that he is very frustrated and confused because this is the third sleep study and all of them have given him different results. The first split night study in 2015 had a supine AHI of 72 and he was started on cpap but could not tolerate cpap and it was d/c. He was referred to Dr. Toy Cookey to get a dental device, but never went to the appt because he was told by Dr. Brett Fairy that he needed to have an HST to make sure he didn't have apnea.  He then had an HST in December of 2017 and it showed mild apnea, but pt says that he didn't think he slept, so a repeat HST was ordered by Dr. Brett Fairy.  Now, he says that Dr. Brett Fairy is telling him that he has mild osa with hypoxemia and wants to do an auto pap, even though he has already tried a cpap before and did not tolerate it.  Pt is asking for a phone call from Dr. Brett Fairy to discuss.

## 2016-10-23 NOTE — Telephone Encounter (Signed)
I spoke to the patient today by cell phone, he is willing to try melatonin at a dose of 5 mg or less for the next 14 nights. He will continue to sleep in a recliner if he feels more comfortable in it. He will call me back in 14 days to report if he has achieved a better sleep continuity. In that case I would like him very much to try an auto titrate her at home. I explained that he can use CPAP even when he sleeps not in bed but in a recliner or sleeps on a couch. When he tried CPAP in the past he developed epistaxis.CD

## 2016-10-24 NOTE — Progress Notes (Signed)
[place] at [facility] [address] [place], [place] [address] [phone]  Date:  10/25/2016   Name:  Perry [name]   DOB:  Jan 13, 1946   MRN:  [id number]  PCP:  Lamar Blinks, MD    Chief Complaint: Follow-up (Pt here for f/u visit due for A1C check. )   History of Present Illness:  Perry [name] is a 71 y.o. very pleasant male patient who presents with the following:  Seen by myself late last year (November) to establish care and go over his chronic health concerns.  Partial HPI from that visit:  Here today to establish care and discuss his chronic health concerns- he has a history of CAD/ CABG in 004, DM, a fib, CAD, dyslipidemia  He is a former pt of Dr. Everlene Farrier- he started metformin in September of this year as his A1c got over 7%.  Recent Labs       Lab Results  Component Value Date   HGBA1C 7.3 04/14/2016     His cardiologist is Dr. Wynonia Lawman- last visit with him was in approx 2 months. He follows up every 6 months.   He does not have any CP.  He had a negative stress a couple of months ago.   We need to repeat his A1c today to see how the metformin is working for him He notes trouble with sleeping- he did see neurology (D[facility]meier) about 3 years ago, was dx with sleep apnea.  He tried to use CPAP but was not able to use it due to intolerance.  He returned the machine.  He went back to neurology in December of last year- he did a home apnea test which was positive.    He notes trouble with excessive daytime sleepiness He tried xanax in the past for sleep but did not like it He is currently undergoing a trial of melatonin.  However he is not sure how this is going to work for him over time.   He goes to bed at 8:30, rises at 4am.  He does tend to sleep fitfully, will wake up and lie awake for a while.  He is not waking up to urinate.  He thinks that he will probably end up trying CPAP again- perhaps a nasal pillow  would be more helpful for him.  Discussed with him in detail.    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 Appendicitis 06/24/2014  . CAD (coronary artery disease) of artery bypass graft 05/04/2014  . Dyslipidemia 05/04/2014  . Acute appendicitis with perforation and peritoneal abscess 05/04/2014  . Ileus, postoperative ([place]) 05/04/2014  . Abscess of abdominal cavity ([place]) 05/04/2014  . Atrial fibrillation ([place]) 04/28/2014  . Hyperlipidemia   . Sleep apnea   . CAD (coronary artery disease)   . Hearing loss   . Diabetes mellitus     Past Medical History:  Diagnosis Date  . A-fib ([place]) 04/2014  . Abscess of abdominal cavity ([place]) 05/04/2014  . Acute appendicitis with perforation and peritoneal abscess 05/04/2014  . Allergy   . CAD (coronary artery disease)   . CAD (coronary artery disease) of artery bypass graft 05/04/2014  . CHF (congestive heart failure) ([place])    pt not aware  . Diabetes mellitus    not  on medication - pt not aware of this  . Dyslipidemia 05/04/2014  . Hearing loss    mild  . Hernia, inguinal    right  . Hyperglycemia   . Hypertension   . Ileus, postoperative ([place]) 05/04/2014  . Lumbar disc disease   . Old inferior wall myocardial infarction 2004    Past Surgical History:  Procedure Laterality Date  . COLONOSCOPY    . CORONARY ANGIOPLASTY     multiple  . CORONARY ARTERY BYPASS GRAFT  2004   LIMA to LAD and SVG to RCA (emergent)  . LAPAROSCOPIC APPENDECTOMY N/A 04/21/2014   Procedure: APPENDECTOMY LAPAROSCOPIC;  Surgeon: Rolm Bookbinder, MD;  Location: [facility];  Service: General;  Laterality: N/A;  . LAPAROSCOPIC APPENDECTOMY N/A 06/24/2014   Procedure: APPENDECTOMY LAPAROSCOPIC ;  Surgeon: Rolm Bookbinder, MD;  Location: [facility];  Service: General;  Laterality: N/A;  . LUMBAR LAMINECTOMY  2001   lumbar  .  TONSILLECTOMY      Social History  Substance Use Topics  . Smoking status: Former Smoker    Types: Cigarettes    Quit date: 06/24/1979  . Smokeless tobacco: Former Systems developer    Types: Chew  . Alcohol use 8.4 oz/week    14 Cans of beer per week     Comment: 2 drinks a day    Family History  Problem Relation Age of Onset  . Heart disease Father   . Heart disease Brother   . Hypertension Brother   . Cancer Sister   . Heart disease Paternal Grandmother     Allergies  Allergen Reactions  . Xanax [Alprazolam] Shortness Of Breath    Medication list has been reviewed and updated.  Current Outpatient Prescriptions on File Prior to Visit  Medication Sig Dispense Refill  . aspirin 81 MG chewable tablet Chew 81 mg by mouth daily.    . fenofibrate (TRICOR) 145 MG tablet Take 1 tablet (145 mg total) by mouth daily. 90 tablet 3  . fish oil-omega-3 fatty acids 1000 MG capsule Take 2 g by mouth daily.      Marland Kitchen lisinopril (PRINIVIL,ZESTRIL) 40 MG tablet One tablet daily (Patient taking differently: Take 40 mg by mouth daily. One tablet daily) 90 tablet 3  . metFORMIN (GLUCOPHAGE XR) 500 MG 24 hr tablet Take one a day for the first week then 2 tablets every morning. (Patient taking differently: Take 1,000 mg by mouth every morning. Take one a day for the first week then 2 tablets every morning.) 60 tablet 11  . metoprolol succinate (TOPROL-XL) 50 MG 24 hr tablet Take 1 tablet (50 mg total) by mouth daily. 90 tablet 90  . rosuvastatin (CRESTOR) 5 MG tablet Take 1 tablet (5 mg total) by mouth daily. 90 tablet 3   No current facility-administered medications on file prior to visit.     Review of Systems:  As per HPI- otherwise negative.   Physical Examination: Vitals:   10/25/16 0920  BP: (!) 142/82  Pulse: 76  Temp: 97.6 F (36.4 C)   Vitals:   10/25/16 0920  Weight: 248 lb 9.6 oz (112.8 kg)  Height: '6\' 1"'$  (1.854 m)   Body mass index is 32.8 kg/m. Ideal Body Weight: Weight in (lb)  to have BMI = 25: 189.1  GEN: WDWN, NAD, Non-toxic, A & O x 3, overweight, looks well HEENT: Atraumatic, Normocephalic. Neck supple. No masses, No LAD.  Bilateral TM wnl, oropharynx normal but he has a small jaw with a minimal underbite and  not a lot of space in the posterior oropharynx .  PEERL,EOMI.   Ears and Nose: No external deformity. CV: RRR, No M/G/R. No JVD. No thrill. No extra heart sounds. PULM: CTA B, no wheezes, crackles, rhonchi. No retractions. No resp. distress. No accessory muscle use. ABD: S, NT, ND, +BS. No rebound. No HSM. EXTR: No c/c/e NEURO Normal gait.  PSYCH: Normally interactive. Conversant. Not depressed or anxious appearing.  Calm demeanor.    Assessment and Plan: Type 2 diabetes mellitus without complication, without long-term current use of insulin (HCC) - Plan: Hemoglobin A1c  Coronary artery disease involving coronary bypass graft of native heart with unstable angina pectoris ([place])  Essential hypertension, benign  Mixed hyperlipidemia  Here today to recheck his A1c since starting metformin last year.  Will be in touch with you pending results Continue statin, BB due to history of CAD BP under reasonable control- hopefully he will also start treatment for his OSA which should help control his BP He is on medication for lipids and his last panel was favorable  Plan for CPE in September   Lipids:    Component Value Date/Time   CHOL 107 05/12/2016   TRIG 102 05/12/2016   HDL 32 (A) 05/12/2016   VLDL 26 04/14/2016 1012   CHOLHDL 3.8 04/14/2016 1012     Signed Lamar Blinks, MD

## 2016-10-24 NOTE — Telephone Encounter (Signed)
Noted, will look out for this pt's return call.

## 2016-10-25 ENCOUNTER — Ambulatory Visit (INDEPENDENT_AMBULATORY_CARE_PROVIDER_SITE_OTHER): Payer: Medicare Other | Admitting: Family Medicine

## 2016-10-25 ENCOUNTER — Encounter: Payer: Self-pay | Admitting: Family Medicine

## 2016-10-25 VITALS — BP 142/82 | HR 76 | Temp 97.6°F | Ht 73.0 in | Wt 248.6 lb

## 2016-10-25 DIAGNOSIS — I25700 Atherosclerosis of coronary artery bypass graft(s), unspecified, with unstable angina pectoris: Secondary | ICD-10-CM

## 2016-10-25 DIAGNOSIS — E119 Type 2 diabetes mellitus without complications: Secondary | ICD-10-CM

## 2016-10-25 DIAGNOSIS — E782 Mixed hyperlipidemia: Secondary | ICD-10-CM | POA: Diagnosis not present

## 2016-10-25 DIAGNOSIS — I10 Essential (primary) hypertension: Secondary | ICD-10-CM | POA: Diagnosis not present

## 2016-10-25 LAB — HEMOGLOBIN A1C: HEMOGLOBIN A1C: 7 % — AB (ref 4.6–6.5)

## 2016-10-25 NOTE — Patient Instructions (Addendum)
We are going to check your A1c today to see how you are doing with your blood sugar- I will be in touch with this result asap I agree that making some changes to your schedule may be helpful- eating dinner earlier so you have more time to digest before bed, and cutting down/ eliminating alcohol can also help your sleep Let me know if you do want to see ENT about possibly removing some of the tissue from your throat to facilitate breathing.  On the other hand, trying CPAP with the nasal pillow may also be a good solution for you  Let's plan to meet again in about 6 months- September

## 2016-11-14 ENCOUNTER — Telehealth: Payer: Self-pay | Admitting: Family Medicine

## 2016-11-14 NOTE — Telephone Encounter (Signed)
Spoke with Perry [name] regarding awv. He stated that he would like for someone to give him a call in Aug or Sept 2018 to schedule awv.

## 2016-11-15 NOTE — Progress Notes (Signed)
This patient repeated a home sleep test at no charge to him. CD  This encounter was created in error - please disregard.

## 2016-11-15 NOTE — Patient Instructions (Signed)
No charge procedure. OSA   CD

## 2017-03-14 ENCOUNTER — Other Ambulatory Visit: Payer: Self-pay | Admitting: Emergency Medicine

## 2017-03-28 ENCOUNTER — Other Ambulatory Visit: Payer: Self-pay | Admitting: Emergency Medicine

## 2017-04-16 ENCOUNTER — Observation Stay (HOSPITAL_COMMUNITY): Payer: Medicare Other

## 2017-04-16 ENCOUNTER — Observation Stay (HOSPITAL_COMMUNITY)
Admission: EM | Admit: 2017-04-16 | Discharge: 2017-04-17 | Disposition: A | Discharge status: Home / Self Care | Payer: Medicare Other | Attending: Cardiology | Admitting: Cardiology

## 2017-04-16 ENCOUNTER — Emergency Department (HOSPITAL_COMMUNITY): Payer: Medicare Other

## 2017-04-16 ENCOUNTER — Encounter (HOSPITAL_COMMUNITY): Payer: Self-pay | Admitting: *Deleted

## 2017-04-16 DIAGNOSIS — I252 Old myocardial infarction: Secondary | ICD-10-CM | POA: Diagnosis not present

## 2017-04-16 DIAGNOSIS — I493 Ventricular premature depolarization: Secondary | ICD-10-CM | POA: Insufficient documentation

## 2017-04-16 DIAGNOSIS — R42 Dizziness and giddiness: Secondary | ICD-10-CM | POA: Diagnosis not present

## 2017-04-16 DIAGNOSIS — M79602 Pain in left arm: Secondary | ICD-10-CM | POA: Insufficient documentation

## 2017-04-16 DIAGNOSIS — I712 Thoracic aortic aneurysm, without rupture: Secondary | ICD-10-CM | POA: Insufficient documentation

## 2017-04-16 DIAGNOSIS — I509 Heart failure, unspecified: Secondary | ICD-10-CM | POA: Insufficient documentation

## 2017-04-16 DIAGNOSIS — I2510 Atherosclerotic heart disease of native coronary artery without angina pectoris: Secondary | ICD-10-CM | POA: Diagnosis not present

## 2017-04-16 DIAGNOSIS — Z79899 Other long term (current) drug therapy: Secondary | ICD-10-CM | POA: Diagnosis not present

## 2017-04-16 DIAGNOSIS — I110 Hypertensive heart disease with heart failure: Secondary | ICD-10-CM | POA: Insufficient documentation

## 2017-04-16 DIAGNOSIS — Z7984 Long term (current) use of oral hypoglycemic drugs: Secondary | ICD-10-CM | POA: Diagnosis not present

## 2017-04-16 DIAGNOSIS — R05 Cough: Secondary | ICD-10-CM | POA: Diagnosis not present

## 2017-04-16 DIAGNOSIS — K760 Fatty (change of) liver, not elsewhere classified: Secondary | ICD-10-CM | POA: Diagnosis not present

## 2017-04-16 DIAGNOSIS — Z951 Presence of aortocoronary bypass graft: Secondary | ICD-10-CM | POA: Insufficient documentation

## 2017-04-16 DIAGNOSIS — E669 Obesity, unspecified: Secondary | ICD-10-CM | POA: Insufficient documentation

## 2017-04-16 DIAGNOSIS — E119 Type 2 diabetes mellitus without complications: Secondary | ICD-10-CM | POA: Insufficient documentation

## 2017-04-16 DIAGNOSIS — R079 Chest pain, unspecified: Secondary | ICD-10-CM | POA: Diagnosis not present

## 2017-04-16 DIAGNOSIS — R001 Bradycardia, unspecified: Secondary | ICD-10-CM | POA: Diagnosis not present

## 2017-04-16 DIAGNOSIS — E785 Hyperlipidemia, unspecified: Secondary | ICD-10-CM | POA: Insufficient documentation

## 2017-04-16 DIAGNOSIS — M79601 Pain in right arm: Secondary | ICD-10-CM | POA: Diagnosis not present

## 2017-04-16 LAB — BASIC METABOLIC PANEL
ANION GAP: 6 (ref 5–15)
BUN: 20 mg/dL (ref 6–20)
CALCIUM: 9.6 mg/dL (ref 8.9–10.3)
CO2: 24 mmol/L (ref 22–32)
Chloride: 110 mmol/L (ref 101–111)
Creatinine, Ser: 1.04 mg/dL (ref 0.61–1.24)
Glucose, Bld: 139 mg/dL — ABNORMAL HIGH (ref 65–99)
POTASSIUM: 4.1 mmol/L (ref 3.5–5.1)
SODIUM: 140 mmol/L (ref 135–145)

## 2017-04-16 LAB — COMPREHENSIVE METABOLIC PANEL
ALK PHOS: 40 U/L (ref 38–126)
ALT: 36 U/L (ref 17–63)
AST: 39 U/L (ref 15–41)
Albumin: 3.9 g/dL (ref 3.5–5.0)
Anion gap: 8 (ref 5–15)
BILIRUBIN TOTAL: 0.6 mg/dL (ref 0.3–1.2)
BUN: 20 mg/dL (ref 6–20)
CALCIUM: 9.2 mg/dL (ref 8.9–10.3)
CO2: 19 mmol/L — ABNORMAL LOW (ref 22–32)
CREATININE: 1.14 mg/dL (ref 0.61–1.24)
Chloride: 108 mmol/L (ref 101–111)
Glucose, Bld: 119 mg/dL — ABNORMAL HIGH (ref 65–99)
Potassium: 3.3 mmol/L — ABNORMAL LOW (ref 3.5–5.1)
Sodium: 135 mmol/L (ref 135–145)
TOTAL PROTEIN: 6.5 g/dL (ref 6.5–8.1)

## 2017-04-16 LAB — TROPONIN I

## 2017-04-16 LAB — CBC
HCT: 40.9 % (ref 39.0–52.0)
HCT: 39.5 % (ref 39.0–52.0)
HEMOGLOBIN: 14 g/dL (ref 13.0–17.0)
HEMOGLOBIN: 13.3 g/dL (ref 13.0–17.0)
MCH: 31.6 pg (ref 26.0–34.0)
MCH: 31.1 pg (ref 26.0–34.0)
MCHC: 34.2 g/dL (ref 30.0–36.0)
MCHC: 33.7 g/dL (ref 30.0–36.0)
MCV: 92.3 fL (ref 78.0–100.0)
MCV: 92.5 fL (ref 78.0–100.0)
Platelets: 180 10*3/uL (ref 150–400)
Platelets: 168 10*3/uL (ref 150–400)
RBC: 4.43 MIL/uL (ref 4.22–5.81)
RBC: 4.27 MIL/uL (ref 4.22–5.81)
RDW: 12.8 % (ref 11.5–15.5)
RDW: 12.9 % (ref 11.5–15.5)
WBC: 5.7 10*3/uL (ref 4.0–10.5)
WBC: 4.9 10*3/uL (ref 4.0–10.5)

## 2017-04-16 LAB — I-STAT TROPONIN, ED: Troponin i, poc: 0 ng/mL (ref 0.00–0.08)

## 2017-04-16 LAB — GLUCOSE, CAPILLARY: GLUCOSE-CAPILLARY: 145 mg/dL — AB (ref 65–99)

## 2017-04-16 LAB — TSH: TSH: 4.003 u[IU]/mL (ref 0.350–4.500)

## 2017-04-16 MED ORDER — ALPRAZOLAM 0.25 MG PO TABS: 0.2500 mg | ORAL_TABLET | Freq: Every evening | ORAL | Status: DC | PRN

## 2017-04-16 MED ORDER — METFORMIN HCL ER 500 MG PO TB24
1000.0000 mg | ORAL_TABLET | Freq: Every day | ORAL | Status: DC
  Administered 2017-04-17: 1000 mg via ORAL
  Filled 2017-04-16 (×2): qty 2

## 2017-04-16 MED ORDER — ROSUVASTATIN CALCIUM 10 MG PO TABS
5.0000 mg | ORAL_TABLET | Freq: Every day | ORAL | Status: DC
  Administered 2017-04-17: 5 mg via ORAL
  Filled 2017-04-16: qty 1

## 2017-04-16 MED ORDER — NITROGLYCERIN 0.4 MG SL SUBL: 0.4000 mg | SUBLINGUAL_TABLET | SUBLINGUAL | Status: DC | PRN

## 2017-04-16 MED ORDER — ONDANSETRON HCL 4 MG/2ML IJ SOLN: 4.0000 mg | Freq: Four times a day (QID) | INTRAMUSCULAR | Status: DC | PRN

## 2017-04-16 MED ORDER — ACETAMINOPHEN 325 MG PO TABS: 650.0000 mg | ORAL_TABLET | ORAL | Status: DC | PRN

## 2017-04-16 MED ORDER — NITROGLYCERIN 0.4 MG SL SUBL
0.4000 mg | SUBLINGUAL_TABLET | SUBLINGUAL | Status: DC | PRN
Start: 2017-04-16 — End: 2017-04-17

## 2017-04-16 MED ORDER — ASPIRIN 81 MG PO CHEW
81.0000 mg | CHEWABLE_TABLET | Freq: Every day | ORAL | Status: DC
  Filled 2017-04-16: qty 1

## 2017-04-16 MED ORDER — LISINOPRIL 40 MG PO TABS
40.0000 mg | ORAL_TABLET | Freq: Every day | ORAL | Status: DC
  Administered 2017-04-17: 40 mg via ORAL
  Filled 2017-04-16: qty 1

## 2017-04-16 MED ORDER — ENOXAPARIN SODIUM 40 MG/0.4ML ~~LOC~~ SOLN
40.0000 mg | SUBCUTANEOUS | Status: DC
  Administered 2017-04-16: 40 mg via SUBCUTANEOUS
  Filled 2017-04-16 (×2): qty 0.4

## 2017-04-16 MED ORDER — FENOFIBRATE 160 MG PO TABS
160.0000 mg | ORAL_TABLET | Freq: Every day | ORAL | Status: DC
  Administered 2017-04-17: 160 mg via ORAL
  Filled 2017-04-16: qty 1

## 2017-04-16 MED ORDER — ASPIRIN EC 81 MG PO TBEC
81.0000 mg | DELAYED_RELEASE_TABLET | Freq: Every day | ORAL | Status: DC
  Administered 2017-04-17: 81 mg via ORAL
  Filled 2017-04-16: qty 1

## 2017-04-16 MED ORDER — OMEGA-3-ACID ETHYL ESTERS 1 G PO CAPS
1.0000 g | ORAL_CAPSULE | Freq: Every day | ORAL | Status: DC
  Administered 2017-04-17: 1 g via ORAL
  Filled 2017-04-16: qty 1

## 2017-04-16 MED ORDER — ASPIRIN EC 81 MG PO TBEC
81.0000 mg | DELAYED_RELEASE_TABLET | Freq: Every day | ORAL | Status: DC
  Filled 2017-04-16: qty 1

## 2017-04-16 MED ORDER — OMEGA-3 FATTY ACIDS 1000 MG PO CAPS
2.0000 g | ORAL_CAPSULE | Freq: Every day | ORAL | Status: DC
Start: 2017-04-16 — End: 2017-04-16

## 2017-04-16 MED ORDER — LISINOPRIL 20 MG PO TABS
40.0000 mg | ORAL_TABLET | Freq: Every day | ORAL | Status: DC
  Filled 2017-04-16: qty 2

## 2017-04-16 NOTE — ED Triage Notes (Signed)
Pt states L arm pain and lightheaded feeling since this am.  [facility] and [facility] 38.

## 2017-04-16 NOTE — Progress Notes (Signed)
New pt admission from ED. Pt brought to the floor in stable condition. Vitals taken. Initial Assessment done. All immediate pertinent needs to patient addressed. Patient Guide given to patient. Important safety instructions relating to hospitalization reviewed with patient. Patient verbalized understanding. Will continue to monitor pt. 

## 2017-04-16 NOTE — ED Notes (Signed)
Attempted report 

## 2017-04-16 NOTE — ED Notes (Signed)
Attempted report not knowing other RN had attempted.

## 2017-04-16 NOTE — ED Provider Notes (Signed)
[name] DEPT Provider Note   CSN: [id number] Arrival date & time: 04/16/17  8315     History   Chief Complaint Chief Complaint  Patient presents with  . Arm Pain  . Dizziness    HPI Perry [name] is a 71 y.o. male.  HPI   71 year old male followed by Dr. Wynonia Lawman from cardiology. Patient reports today with dizziness, left arm pain. Patient reported that he was on his job today and developed dizziness, lightheadedness. He reports [facility] and was found to have a heart rate in the 30s. They checked it 2 times with her seeing. And then palpated one time and found the heart heart rate to be in the 20s. Patient then developed pain to left shoulder and left arm.   Patient has previous CABG with LIMA to LAD in 2004. Patient most recent cath was in 2014 showed 50% to LAD. And occluded RCA.      Past Medical History:  Diagnosis Date  . A-fib ([place]) 04/2014  . Abscess of abdominal cavity ([place]) 05/04/2014  . Acute appendicitis with perforation and peritoneal abscess 05/04/2014  . Allergy   . CAD (coronary artery disease)   . CAD (coronary artery disease) of artery bypass graft 05/04/2014  . CHF (congestive heart failure) ([place])    pt not aware  . Diabetes mellitus    not on medication - pt not aware of this  . Dyslipidemia 05/04/2014  . Hearing loss    mild  . Hernia, inguinal    right  . Hyperglycemia   . Hypertension   . Ileus, postoperative ([place]) 05/04/2014  . Lumbar disc disease   . Old inferior wall myocardial infarction 2004    Patient Active Problem List   Diagnosis Date Noted  . Bradycardia 04/16/2017  . Erroneous encounter - disregard 11/15/2016  . Acute appendicitis with perforation and peritoneal abscess 05/04/2014  . Abscess of abdominal cavity ([place]) 05/04/2014  . Atrial fibrillation ([place]) 04/28/2014  . Hyperlipidemia   . Sleep apnea   . CAD (coronary artery disease)   . Hearing loss   . Diabetes mellitus     Past Surgical History:  Procedure  Laterality Date  . COLONOSCOPY    . CORONARY ANGIOPLASTY     multiple  . CORONARY ARTERY BYPASS GRAFT  2004   LIMA to LAD and SVG to RCA (emergent)  . LAPAROSCOPIC APPENDECTOMY N/A 04/21/2014   Procedure: APPENDECTOMY LAPAROSCOPIC;  Surgeon: Rolm Bookbinder, MD;  Location: [facility];  Service: General;  Laterality: N/A;  . LAPAROSCOPIC APPENDECTOMY N/A 06/24/2014   Procedure: APPENDECTOMY LAPAROSCOPIC ;  Surgeon: Rolm Bookbinder, MD;  Location: [facility];  Service: General;  Laterality: N/A;  . LUMBAR LAMINECTOMY  2001   lumbar  . TONSILLECTOMY         Home Medications    Prior to Admission medications   Medication Sig Start Date End Date Taking? Authorizing Provider  ALPRAZolam Duanne Moron) 0.25 MG tablet Take 0.25 mg by mouth at bedtime as needed for anxiety.   Yes [provider]  aspirin 81 MG chewable tablet Chew 81 mg by mouth daily.   Yes [provider]  fenofibrate (TRICOR) 145 MG tablet Take 1 tablet (145 mg total) by mouth daily. 04/12/16  Yes Darlyne Russian, MD  fish oil-omega-3 fatty acids 1000 MG capsule Take 2 g by mouth daily.     Yes [provider]  lisinopril (PRINIVIL,ZESTRIL) 40 MG tablet One tablet daily Patient taking differently: Take 40  mg by mouth daily. One tablet daily 04/12/16  Yes Daub, Loura Back, MD  metFORMIN (GLUCOPHAGE-XR) 500 MG 24 hr tablet TAKE 2 TABLETS BY MOUTH EVERY MORNING 03/28/17  Yes Copland, Gay Filler, MD  nitroGLYCERIN (NITROSTAT) 0.4 MG SL tablet Place 0.4 mg under the tongue every 5 (five) minutes as needed for chest pain.   Yes [provider]  rosuvastatin (CRESTOR) 5 MG tablet Take 1 tablet (5 mg total) by mouth daily. 04/12/16  Yes Darlyne Russian, MD  spironolactone (ALDACTONE) 25 MG tablet Take 1 tablet (25 mg total) by mouth daily. 04/17/17   Jacolyn Reedy, MD    Family History Family History  Problem Relation Age of Onset  . Heart disease Father   . Heart disease Brother   . Hypertension Brother   .  Cancer Sister   . Heart disease Paternal Grandmother     Social History Social History  Substance Use Topics  . Smoking status: Former Smoker    Types: Cigarettes    Quit date: 06/24/1979  . Smokeless tobacco: Former Systems developer    Types: Chew  . Alcohol use 8.4 oz/week    14 Cans of beer per week     Comment: 2 drinks a day     Allergies   Xanax [alprazolam]   Review of Systems Review of Systems  Constitutional: Negative for activity change.  Respiratory: Negative for shortness of breath.   Cardiovascular: Negative for chest pain.  Gastrointestinal: Negative for abdominal pain.  Neurological: Positive for dizziness and light-headedness.  All other systems reviewed and are negative.    Physical Exam Updated Vital Signs BP 131/66 (BP Location: Right Arm)   Pulse 60   Temp 98 F (36.7 C) (Oral)   Resp 17   Ht 6\' 1"  (1.854 m)   Wt 110.9 kg (244 lb 6.4 oz)   SpO2 99%   BMI 32.24 kg/m   Physical Exam  Constitutional: He is oriented to person, place, and time. He appears well-nourished.  HENT:  Head: Normocephalic.  Eyes: Conjunctivae are normal.  Cardiovascular: Normal rate and regular rhythm.   Scar to chest wall  Pulmonary/Chest: Effort normal and breath sounds normal. No respiratory distress. He has no wheezes.  Abdominal: Soft. He exhibits no distension. There is no tenderness.  Neurological: He is oriented to person, place, and time.  Skin: Skin is warm and dry. He is not diaphoretic.  Psychiatric: He has a normal mood and affect. His behavior is normal.     ED Treatments / Results  Labs (all labs ordered are listed, but only abnormal results are displayed) Labs Reviewed  BASIC METABOLIC PANEL - Abnormal; Notable for the following:       Result Value   Glucose, Bld 139 (*)    All other components within normal limits  COMPREHENSIVE METABOLIC PANEL - Abnormal; Notable for the following:    Potassium 3.3 (*)    CO2 19 (*)    Glucose, Bld 119 (*)    All  other components within normal limits  GLUCOSE, CAPILLARY - Abnormal; Notable for the following:    Glucose-Capillary 145 (*)    All other components within normal limits  LIPID PANEL - Abnormal; Notable for the following:    HDL 27 (*)    All other components within normal limits  BRAIN NATRIURETIC PEPTIDE - Abnormal; Notable for the following:    B Natriuretic Peptide 130.8 (*)    All other components within normal limits  CBC  TSH  TROPONIN I  TROPONIN I  TROPONIN I  CBC  I-STAT TROPONIN, ED    EKG  EKG Interpretation  Date/Time:  Monday April 16 2017 09:22:32 EDT Ventricular Rate:  65 PR Interval:  196 QRS Duration: 112 QT Interval:  458 QTC Calculation: 476 R Axis:   -34 Text Interpretation:  pvc in bigeminy pattern Confirmed by Thomasene Lot, [place] [phone]) on 04/16/2017 11:48:13 AM       Radiology No results found.  Procedures Procedures (including critical care time)  Medications Ordered in ED Medications - No data to display   Initial Impression / Assessment and Plan / ED Course  I have reviewed the triage vital signs and the nursing notes.  Pertinent labs & imaging results that were available during my care of the patient were reviewed by me and considered in my medical decision making (see chart for details).     Patient is 71 year old male presenting with left arm pain, dizziness.   71 year old male followed by Dr. Wynonia Lawman from cardiology. Patient reports today with dizziness, left arm pain. Patient reported that he was on his job today and developed dizziness, lightheadedness. [facility] and [facility] to have a heart rate in the 30s. They checked it 2 times with her seeing. And then palpated one time and found the heart heart rate to be in the 20s. Patient then developed pain to left shoulder and left arm.   Patient has previous CABG with LIMA to LAD in 2004. Patient most recent cath was in 2014 showed 50% to LAD. And occluded RCA.       10:31 AM Concern that he may be having episodes of symptomatic bradycardia. Will discuss with Dr. Oran Rein and likely admit.   Final Clinical Impressions(s) / ED Diagnoses   Final diagnoses:  None    New Prescriptions Discharge Medication List as of 04/17/2017  1:47 PM    START taking these medications   Details  spironolactone (ALDACTONE) 25 MG tablet Take 1 tablet (25 mg total) by mouth daily., Starting Tue 04/17/2017, Normal         Macarthur Critchley, MD 04/19/17 1032

## 2017-04-16 NOTE — H&P (Signed)
History and Physical   Admit date: 04/16/2017 Name:  Perry [name] Medical record number: [id number] DOB/Age:  1946/06/05  71 y.o. male  Referring Physician:   [facility]  Primary Cardiologist:  Wynonia Lawman  Primary Physician:   [facility]  Chief complaint/reason for admission: Dizziness, left arm pain, dyspnea  HPI:  This 71 year old male has a history of coronary artery disease with previous inferior infarction.  He had emergency bypass grafting in 2004 in the setting of an acute inferior infarction with artery is not able to be opened up.  He had a mammary graft to LAD and a vein graft to the right coronary artery.  He has had PVCs for years.  He has a prior history of diabetes mellitus.  He has been unable to lose weight and has significant dyslipidemia as well as metabolic syndrome.  He had a myocardial perfusion scan done last October that showed good exercise capacity with no ischemia.  Ejection fraction is preserved.  He was in his usual state of health but has noticed worsening dyspnea on exertion over the past 6 months.  About 2 months ago he had an episode where he had a severe cough for several days and gradually resolved.  He has not had any fevers.  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  He awakened this morning and was at work and was fairly dizzy and went to a drugstore and had his pulse checked on the blood pressure monitor and found to be 32.  He was concerned about this because he was somewhat dizzy and then was advised to go to the emergency room.  He was found to be in bigeminal arrhythmia.  On the way to the emergency room he had some vague left arm pain but did not have any jaw pain or chest pain that he has had with his previous cardiac symptoms.  Initial troponin was negative and EKG was unremarkable.  Chest x-ray showed some density present at the left base and he also had some density at the right hilar area.  He had a history  of atrial fibrillation during the setting of a ruptured appendix but has not had recurrent atrial fibrillation since then.  He was brought into the hospital for serial troponins following arm pain as well as monitoring for dizziness and dyspnea and to see if he has recurrent bradycardia.    Past Medical History:  Diagnosis Date  . A-fib ([place]) 04/2014  . Abscess of abdominal cavity ([place]) 05/04/2014  . Acute appendicitis with perforation and peritoneal abscess 05/04/2014  . Allergy   . CAD (coronary artery disease)   . CAD (coronary artery disease) of artery bypass graft 05/04/2014  . CHF (congestive heart failure) ([place])    pt not aware  . Diabetes mellitus    not on medication - pt not aware of this  . Dyslipidemia 05/04/2014  . Hearing loss    mild  . Hernia, inguinal    right  . Hyperglycemia   . Hypertension   . Ileus, postoperative ([place]) 05/04/2014  . Lumbar disc disease   . Old inferior wall myocardial infarction 2004      Past Surgical History:  Procedure Laterality Date  . COLONOSCOPY    . CORONARY ANGIOPLASTY     multiple  . CORONARY ARTERY BYPASS GRAFT  2004   LIMA to LAD and SVG to RCA (emergent)  . LAPAROSCOPIC APPENDECTOMY N/A 04/21/2014   Procedure: APPENDECTOMY LAPAROSCOPIC;  Surgeon: Rolm Bookbinder, MD;  Location: [facility];  Service: General;  Laterality: N/A;  . LAPAROSCOPIC APPENDECTOMY N/A 06/24/2014   Procedure: APPENDECTOMY LAPAROSCOPIC ;  Surgeon: Rolm Bookbinder, MD;  Location: [facility];  Service: General;  Laterality: N/A;  . LUMBAR LAMINECTOMY  2001   lumbar  . TONSILLECTOMY     Allergies: is allergic to xanax [alprazolam].   Medications: Prior to Admission medications   Medication Sig Start Date End Date Taking? Authorizing Provider  ALPRAZolam Duanne Moron) 0.25 MG tablet Take 0.25 mg by mouth at bedtime as needed for anxiety.   Yes [provider]  aspirin 81 MG chewable tablet Chew 81 mg by mouth daily.   Yes [provider]  fenofibrate  (TRICOR) 145 MG tablet Take 1 tablet (145 mg total) by mouth daily. 04/12/16  Yes Darlyne Russian, MD  fish oil-omega-3 fatty acids 1000 MG capsule Take 2 g by mouth daily.     Yes [provider]  lisinopril (PRINIVIL,ZESTRIL) 40 MG tablet One tablet daily Patient taking differently: Take 40 mg by mouth daily. One tablet daily 04/12/16  Yes Daub, Loura Back, MD  metFORMIN (GLUCOPHAGE-XR) 500 MG 24 hr tablet TAKE 2 TABLETS BY MOUTH EVERY MORNING 03/28/17  Yes Copland, Gay Filler, MD  metoprolol succinate (TOPROL-XL) 50 MG 24 hr tablet Take 1 tablet (50 mg total) by mouth daily. 04/12/16  Yes Darlyne Russian, MD  nitroGLYCERIN (NITROSTAT) 0.4 MG SL tablet Place 0.4 mg under the tongue every 5 (five) minutes as needed for chest pain.   Yes [provider]  rosuvastatin (CRESTOR) 5 MG tablet Take 1 tablet (5 mg total) by mouth daily. 04/12/16  Yes Daub, Loura Back, MD    Family History:  Family Status  Relation Status  . Father Deceased  . Brother Alive  . Brother Alive  . Mother Deceased  . Sister Alive  . MGM Deceased  . MGF Deceased  . PGM Deceased  . PGF Deceased    Social History:   reports that he quit smoking about 37 years ago. His smoking use included Cigarettes. He has quit using smokeless tobacco. His smokeless tobacco use included Chew. He reports that he drinks about 8.4 oz of alcohol per week . He reports that he does not use drugs.   Social History   Social History Narrative   Patient is married [name]) and lives at home with his wife.   Patient has two adult children.   Patient is working full-time.   Patient has a Master's degree.   Patient drinks one cup of coffee every morning.   Patient is right-handed.     Review of Systems:  Other than as noted above, the remainder of the review of systems is normal  Physical Exam: BP (!) 162/80   Pulse (!) 30   Temp 97.9 F (36.6 C) (Oral)   Resp 19   Ht 6\' 1"  (1.854 m)   Wt 111.1 kg (245 lb)   SpO2 100%   BMI  32.32 kg/m  General appearance: Pleasant mildly obese nail in no acute distress Head: Normocephalic, without obvious abnormality, atraumatic Eyes: conjunctivae/corneas clear. PERRL, EOM's intact.  Not examined Neck: no adenopathy, no carotid bruit, no JVD and supple, symmetrical, trachea midline Lungs: clear to auscultation bilaterally, healed median sternotomy scar Heart: regular rate and rhythm, S1, S2 normal, no murmur, click, rub or gallop and occasional PVCs noted.   Abdomen: soft, non-tender; bowel sounds normal; no masses,  no organomegaly Rectal: deferred  Extremities: extremities normal, atraumatic, no cyanosis or edema Pulses: 2+ and symmetric Skin: Skin color, texture, turgor normal. No rashes or lesions Neurologic: Grossly normal  Labs: CBC  Recent Labs  04/16/17 0936  WBC 5.7  RBC 4.43  HGB 14.0  HCT 40.9  PLT 180  MCV 92.3  MCH 31.6  MCHC 34.2  RDW 12.8   CMP   Recent Labs  04/16/17 0936  NA 140  K 4.1  CL 110  CO2 24  GLUCOSE 139*  BUN 20  CREATININE 1.04  CALCIUM 9.6  GFRNONAA >60  GFRAA >60   EKG: Nonspecific ST and T-wave changes, sinus rhythm with bigeminal PVCs. Independently reviewed by me  Radiology: Calcification in the wall of the aorta.  Density present at the left base as well as the right hilar area   IMPRESSIONS: 1.  Dizziness of uncertain etiology in the setting of bigeminal PVCs 2.  Atypical left arm pain 3.  Dyspnea on exertion of relatively recent onset 4.  CAD with previous inferior infarction with previous bypass grafting 5.  Hypertensive heart disease 6.  Obesity 7.  Dyslipidemia  PLAN: We'll bring him in to discontinue metoprolol to see if we can increase his heart rate some.  Telemetry monitoring.  He has had impressive dyspnea on exertion and I would recommend a CT scan to evaluate his interstitial lungs as well as to evaluate for other etiologies of pulmonary disease.  Obtain pulmonary consult.  Obtain serial  troponins.    Signed: Kerry Hough MD [facility]  04/16/2017, 6:06 PM

## 2017-04-16 NOTE — ED Notes (Signed)
Cardiologist at bedside.  

## 2017-04-16 NOTE — ED Notes (Signed)
Spoke to pharmacy about re-timing meds.

## 2017-04-17 ENCOUNTER — Other Ambulatory Visit: Payer: Self-pay | Admitting: Emergency Medicine

## 2017-04-17 ENCOUNTER — Ambulatory Visit (HOSPITAL_COMMUNITY): Payer: Medicare Other

## 2017-04-17 ENCOUNTER — Other Ambulatory Visit: Payer: Self-pay

## 2017-04-17 DIAGNOSIS — R42 Dizziness and giddiness: Secondary | ICD-10-CM | POA: Diagnosis not present

## 2017-04-17 DIAGNOSIS — I493 Ventricular premature depolarization: Secondary | ICD-10-CM | POA: Diagnosis not present

## 2017-04-17 DIAGNOSIS — R001 Bradycardia, unspecified: Secondary | ICD-10-CM | POA: Diagnosis not present

## 2017-04-17 DIAGNOSIS — I509 Heart failure, unspecified: Secondary | ICD-10-CM | POA: Diagnosis not present

## 2017-04-17 DIAGNOSIS — I110 Hypertensive heart disease with heart failure: Secondary | ICD-10-CM | POA: Diagnosis not present

## 2017-04-17 LAB — ECHOCARDIOGRAM COMPLETE
Ao-asc: 40 cm
CHL CUP REG VEL DIAS: 97.9 cm/s
EWDT: 359 ms
FS: 27 % — AB (ref 28–44)
HEIGHTINCHES: 73 in
IV/PV OW: 0.8
LA vol A4C: 58.5 ml
LA vol: 85.9 mL
LADIAMINDEX: 1.53 cm/m2
LASIZE: 37 mm
LAVOLIN: 35.5 mL/m2
LEFT ATRIUM END SYS DIAM: 37 mm
LV PW d: 15 mm — AB (ref 0.6–1.1)
LVOT area: 3.8 cm2
LVOTD: 22 mm
MV Dec: 359
MV pk E vel: 64.5 m/s
MVPKAVEL: 52.4 m/s
RV TAPSE: 19.7 mm
WEIGHTICAEL: 3910.4 [oz_av]

## 2017-04-17 LAB — LIPID PANEL
CHOL/HDL RATIO: 3.5 ratio
CHOLESTEROL: 94 mg/dL (ref 0–200)
HDL: 27 mg/dL — ABNORMAL LOW (ref >40)
LDL Cholesterol: 40 mg/dL (ref 0–99)
Triglycerides: 133 mg/dL (ref <150)
VLDL: 27 mg/dL (ref 0–40)

## 2017-04-17 LAB — TROPONIN I

## 2017-04-17 LAB — BRAIN NATRIURETIC PEPTIDE: B Natriuretic Peptide: 130.8 pg/mL — ABNORMAL HIGH (ref 0.0–100.0)

## 2017-04-17 MED ORDER — SPIRONOLACTONE 25 MG PO TABS: 25.0000 mg | ORAL_TABLET | Freq: Every day | ORAL | 12 refills | Status: DC

## 2017-04-17 MED ORDER — SPIRONOLACTONE 25 MG PO TABS
25.0000 mg | ORAL_TABLET | Freq: Every day | ORAL | Status: DC
  Administered 2017-04-17: 25 mg via ORAL
  Filled 2017-04-17: qty 1

## 2017-04-17 NOTE — Discharge Summary (Signed)
Physician Discharge Summary  Patient ID: Perry [name] MRN: [id number] DOB/AGE: 71-12-1945 71 y.o.  Admit date: 04/16/2017 Discharge date: 04/17/2017  [facility]ician:  [facility]   Primary Discharge Diagnosis:   1.  Dizziness of uncertain etiology-resolved  Secondary Discharge Diagnosis: 2.  Arm pain resolved 3.  Premature ventricular contractions 4.  Hypertensive heart disease 5.  Coronary artery disease with previous bypass grafting and previous inferior infarction 6.  Obesity 7.  Thoracic aneurysm 8.  Fatty liver 9.  Type 2 diabetes mellitus non-insulin-dependent fair control  Procedures:  Echocardiogram, CT scan of chest  Hospital Course: This 71 year old male has a history of coronary artery disease with previous inferior infarction.  He had emergency bypass grafting in 2004 in the setting of an acute inferior infarction with artery is not able to be opened up.  He had a mammary graft to LAD and a vein graft to the right coronary artery.  He has had PVCs for years.  He has a prior history of diabetes mellitus.  He has been unable to lose weight and has significant dyslipidemia as well as metabolic syndrome.  He had a myocardial perfusion scan done last October that showed good exercise capacity with no ischemia.  Ejection fraction is preserved.  He was in his usual state of health but has noticed worsening dyspnea on exertion over the past 6 months.  About 2 months ago he had an episode where he had a severe cough for several days and gradually resolved.  He has not had any fevers.  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  He awakened this morning and was at work and was fairly dizzy and went to a drugstore and had his pulse checked on the blood pressure monitor and found to be 32.  He was concerned about this because he was somewhat dizzy and then was advised to go to the emergency room.  He was found to be in bigeminal arrhythmia.  On the way to  the emergency room he had some vague left arm pain but did not have any jaw pain or chest pain that he has had with his previous cardiac symptoms.  Initial troponin was negative and EKG was unremarkable.  Chest x-ray showed some density present at the left base and he also had some density at the right hilar area.  He had a history of atrial fibrillation during the setting of a ruptured appendix but has not had recurrent atrial fibrillation since then.  He was brought into the hospital for serial troponins following arm pain as well as monitoring for dizziness and dyspnea and to see if he has recurrent bradycardia.   The patient was brought into the hospital.  An initial chest x-ray was abnormal with some basilar changes.  A high-resolution CT scan showed atherosclerotic thoracic aorta with a 4.2 cm ascending aorta, 3 vessel calcification.  There was deep and didn't ground glass attenuation that cleared on inspiratory compatible with hypoventilatory change.  There is no suggestion of interstitial lung disease or pathologic nodules.  EKG showed sinus bradycardia with bigeminal PVCs.  Serial troponins were negative.  He had no recurrent arm pain and his dizziness resolved overnight.  The results were discussed with the patient and his wife.  He had an echocardiogram done the day of discharge which showed an EF of 50-55% with mild inferior hypokinesis.   It was postulated that the dizziness could've been due to relative bradycardia due to bigeminal  PVCs.  His beta blocker is given a be stopped and spironolactone 25 mg is added to his regimen.  He continues complaining of dyspnea then he will need to have an outpatient stress test again.  I also recommended that he make a concerted effort to lose weight particularly with new findings of fatty liver noted on CT scan.  He is discharged in improved condition and is to call if there are problems.  If he continues to complain of dizziness he will need to wear a cardiac  event monitor.  Discharge Exam: Blood pressure 131/66, pulse 60, temperature 98 F (36.7 C), temperature source Oral, resp. rate 17, height 6\' 1"  (1.854 m), weight 110.9 kg (244 lb 6.4 oz), SpO2 99 %. Weight: 110.9 kg (244 lb 6.4 oz) Lungs clear, no S3 Labs: CBC:   Lab Results  Component Value Date   WBC 4.9 04/16/2017   HGB 13.3 04/16/2017   HCT 39.5 04/16/2017   MCV 92.5 04/16/2017   PLT 168 04/16/2017    CMP:   Recent Labs Lab 04/16/17 1912  NA 135  K 3.3*  CL 108  CO2 19*  BUN 20  CREATININE 1.14  CALCIUM 9.2  PROT 6.5  BILITOT 0.6  ALKPHOS 40  ALT 36  AST 39  GLUCOSE 119*    Lipid Panel     Component Value Date/Time   CHOL 94 04/17/2017 0421   TRIG 133 04/17/2017 0421   HDL 27 (L) 04/17/2017 0421   CHOLHDL 3.5 04/17/2017 0421   VLDL 27 04/17/2017 0421   LDLCALC 40 04/17/2017 0421    Cardiac Enzymes:  Recent Labs  04/16/17 1912 04/16/17 2235 04/17/17 0421  TROPONINI <0.03 <0.03 <0.03   Thyroid: Lab Results  Component Value Date   TSH 4.003 04/16/2017    Hemoglobin A1C: Lab Results  Component Value Date   HGBA1C 7.0 (H) 10/25/2016     Radiology: Chest x-ray showed patchy density in the left lower lobe compatible with atelectasis or early pneumonia.  CT scan did not show evidence of pneumonia but findings were reported as above  EKG: Sinus rhythm with PVCs LVH.  Personally reviewed  Discharge Medications: Allergies as of 04/17/2017      Reactions   Xanax [alprazolam] Shortness Of Breath      Medication List    STOP taking these medications   metoprolol succinate 50 MG 24 hr tablet Commonly known as:  TOPROL-XL     TAKE these medications   ALPRAZolam 0.25 MG tablet Commonly known as:  XANAX Take 0.25 mg by mouth at bedtime as needed for anxiety.   aspirin 81 MG chewable tablet Chew 81 mg by mouth daily.   fenofibrate 145 MG tablet Commonly known as:  TRICOR Take 1 tablet (145 mg total) by mouth daily.   fish  oil-omega-3 fatty acids 1000 MG capsule Take 2 g by mouth daily.   lisinopril 40 MG tablet Commonly known as:  PRINIVIL,ZESTRIL One tablet daily What changed:  how much to take  how to take this  when to take this  additional instructions   metFORMIN 500 MG 24 hr tablet Commonly known as:  GLUCOPHAGE-XR TAKE 2 TABLETS BY MOUTH EVERY MORNING   nitroGLYCERIN 0.4 MG SL tablet Commonly known as:  NITROSTAT Place 0.4 mg under the tongue every 5 (five) minutes as needed for chest pain.   rosuvastatin 5 MG tablet Commonly known as:  CRESTOR Take 1 tablet (5 mg total) by mouth daily.   spironolactone 25  MG tablet Commonly known as:  ALDACTONE Take 1 tablet (25 mg total) by mouth daily.            Discharge Care Instructions        Start     Ordered   04/17/17 0000  spironolactone (ALDACTONE) 25 MG tablet  Daily     04/17/17 1317   04/17/17 0000  Increase activity slowly     04/17/17 1317   04/17/17 0000  Diet - low sodium heart healthy     04/17/17 1317   04/17/17 0000  Discharge instructions    Comments:  Call if recurrent problems.  Do not take metoprolol but begin taking spironolactone and monitor blood pressures at home.   04/17/17 1317      Followup plans and appointments: Call Dr. Wynonia Lawman for an appointment to be seen in one week.  Call if recurrent problems.    Time spent with patient to include physician time:  45 minutes Signed: W. Doristine Church. MD [facility] 04/17/2017, 1:17 PM

## 2017-04-17 NOTE — Progress Notes (Signed)
  Echocardiogram 2D Echocardiogram has been performed.  Ilma Achee G Daren Doswell 04/17/2017, 1:21 PM

## 2017-04-17 NOTE — Progress Notes (Signed)
Discharge instructions reviewed with patient, questions answered, verbalized understanding.  Patient ambulatory to main entrance accompanied by nursing student to drive himself home.

## 2017-04-23 DIAGNOSIS — E784 Other hyperlipidemia: Secondary | ICD-10-CM | POA: Diagnosis not present

## 2017-04-23 DIAGNOSIS — I10 Essential (primary) hypertension: Secondary | ICD-10-CM | POA: Diagnosis not present

## 2017-04-23 DIAGNOSIS — I252 Old myocardial infarction: Secondary | ICD-10-CM | POA: Diagnosis not present

## 2017-04-23 DIAGNOSIS — Z951 Presence of aortocoronary bypass graft: Secondary | ICD-10-CM | POA: Diagnosis not present

## 2017-04-23 DIAGNOSIS — I480 Paroxysmal atrial fibrillation: Secondary | ICD-10-CM | POA: Diagnosis not present

## 2017-04-23 DIAGNOSIS — I493 Ventricular premature depolarization: Secondary | ICD-10-CM | POA: Diagnosis not present

## 2017-04-23 DIAGNOSIS — I2510 Atherosclerotic heart disease of native coronary artery without angina pectoris: Secondary | ICD-10-CM | POA: Diagnosis not present

## 2017-04-23 DIAGNOSIS — E668 Other obesity: Secondary | ICD-10-CM | POA: Diagnosis not present

## 2017-04-23 DIAGNOSIS — R0600 Dyspnea, unspecified: Secondary | ICD-10-CM | POA: Diagnosis not present

## 2017-04-28 NOTE — Progress Notes (Deleted)
[place] at [facility] [address] [place],  [address] [phone]  Date:  04/30/2017   Name:  Perry [name]   DOB:  [birth year]   MRN:  [id number]  PCP:  Darreld Mclean, MD    Chief Complaint: No chief complaint on file.   History of Present Illness:  Perry [name] is a 71 y.o. very pleasant male patient who presents with the following:  Here today for a 6 month follow-up appt History of DM, Hyperlipidemia, a fib, OSA, CAD He also was admitted overnight earlier this month with dizziness:  Admit date: 04/16/2017 Discharge date: 04/17/2017  [facility]   Primary Discharge Diagnosis:   1.  Dizziness of uncertain etiology-resolved  Secondary Discharge Diagnosis: 2.  Arm pain resolved 3.  Premature ventricular contractions 4.  Hypertensive heart disease 5.  Coronary artery disease with previous bypass grafting and previous inferior infarction 6.  Obesity 7.  Thoracic aneurysm 8.  Fatty liver 9.  Type 2 diabetes mellitus non-insulin-dependent fair control  Procedures:  Echocardiogram, CT scan of chest  Hospital Course: This 71 year old male has a history of coronary artery disease with previous inferior infarction. He had emergency bypass grafting in 2004 in the setting of an acute inferior infarction with artery is not able to be opened up. He had a mammary graft to LAD and a vein graft to the right coronary artery. He has had PVCs for years. He has a prior history of diabetes mellitus. He has been unable to lose weight and has significant dyslipidemia as well as metabolic syndrome. He had a myocardial perfusion scan done last October that showed good exercise capacity with no ischemia. Ejection fraction is preserved. He was in his usual state of health but has noticed worsening dyspnea on exertion over the past 6 months. About 2 months ago he had an episode where he had  a severe cough for several days and gradually resolved. He has not had any fevers. 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 He awakened this morning and was at work and was fairly dizzy and went to a drugstore and had his pulse checked on the blood pressure monitor and found to be 32. He was concerned about this because he was somewhat dizzy and then was advised to go to the emergency room. He was found to be in bigeminal arrhythmia. On the way to the emergency room he had some vague left arm pain but did not have any jaw pain or chest pain that he has had with his previous cardiac symptoms. Initial troponin was negative and EKG was unremarkable. Chest x-ray showed some density present at the left base and he also had some density at the right hilar area. He had a history of atrial fibrillation during the setting of a ruptured appendix but has not had recurrent atrial fibrillation since then. He was brought into the hospital for serial troponins following arm pain as well as monitoring for dizziness and dyspnea and to see if he hasrecurrent bradycardia.   The patient was brought into the hospital.  An initial chest x-ray was abnormal with some basilar changes.  A high-resolution CT scan showed atherosclerotic thoracic aorta with a 4.2 cm ascending aorta, 3 vessel calcification.  There was deep and didn't ground glass attenuation that cleared on inspiratory compatible with hypoventilatory change.  There is no suggestion of interstitial lung disease or  pathologic nodules.  EKG showed sinus bradycardia with bigeminal PVCs.  Serial troponins were negative.  He had no recurrent arm pain and his dizziness resolved overnight.  The results were discussed with the patient and his wife.  He had an echocardiogram done the day of discharge which showed an EF of 50-55% with mild inferior hypokinesis.   It was postulated that the dizziness could've been due to relative bradycardia due to  bigeminal PVCs.  His beta blocker is given a be stopped and spironolactone 25 mg is added to his regimen.  He continues complaining of dyspnea then he will need to have an outpatient stress test again.  I also recommended that he make a concerted effort to lose weight particularly with new findings of fatty liver noted on CT scan.  He is discharged in improved condition and is to call if there are problems.  If he continues to complain of dizziness he will need to wear a cardiac event monitor.  Due today for a1c, foot exam, eye exam, flu shot  He just had a CMP and CBC while intp- slight hypokalemia, will repeat for him today Lab Results  Component Value Date   TSH 4.003 04/16/2017    Lab Results  Component Value Date   HGBA1C 7.0 (H) 10/25/2016    Patient Active Problem List   Diagnosis Date Noted  . Bradycardia 04/16/2017  . Acute appendicitis with perforation and peritoneal abscess 05/04/2014  . Abscess of abdominal cavity ([place]) 05/04/2014  . Atrial fibrillation ([place]) 04/28/2014  . Hyperlipidemia   . Sleep apnea   . CAD (coronary artery disease)   . Hearing loss   . Diabetes mellitus     Past Medical History:  Diagnosis Date  . A-fib ([place]) 04/2014  . Abscess of abdominal cavity ([place]) 05/04/2014  . Acute appendicitis with perforation and peritoneal abscess 05/04/2014  . Allergy   . CAD (coronary artery disease)   . CAD (coronary artery disease) of artery bypass graft 05/04/2014  . CHF (congestive heart failure) ([place])    pt not aware  . Diabetes mellitus    not on medication - pt not aware of this  . Dyslipidemia 05/04/2014  . Hearing loss    mild  . Hernia, inguinal    right  . Hyperglycemia   . Hypertension   . Ileus, postoperative ([place]) 05/04/2014  . Lumbar disc disease   . Old inferior wall myocardial infarction 2004    Past Surgical History:  Procedure Laterality Date  . COLONOSCOPY    . CORONARY ANGIOPLASTY     multiple  . CORONARY ARTERY BYPASS GRAFT  2004    LIMA to LAD and SVG to RCA (emergent)  . LAPAROSCOPIC APPENDECTOMY N/A 04/21/2014   Procedure: APPENDECTOMY LAPAROSCOPIC;  Surgeon: Rolm Bookbinder, MD;  Location: [place];  Service: General;  Laterality: N/A;  . LAPAROSCOPIC APPENDECTOMY N/A 06/24/2014   Procedure: APPENDECTOMY LAPAROSCOPIC ;  Surgeon: Rolm Bookbinder, MD;  Location: [place];  Service: General;  Laterality: N/A;  . LUMBAR LAMINECTOMY  2001   lumbar  . TONSILLECTOMY      Social History  Substance Use Topics  . Smoking status: Former Smoker    Types: Cigarettes    Quit date: 06/24/1979  . Smokeless tobacco: Former Systems developer    Types: Chew  . Alcohol use 8.4 oz/week    14 Cans of beer per week     Comment: 2 drinks a day    Family History  Problem Relation Age of  Onset  . Heart disease Father   . Heart disease Brother   . Hypertension Brother   . Cancer Sister   . Heart disease Paternal Grandmother     Allergies  Allergen Reactions  . Xanax [Alprazolam] Shortness Of Breath    Medication list has been reviewed and updated.  Current Outpatient Prescriptions on File Prior to Visit  Medication Sig Dispense Refill  . ALPRAZolam (XANAX) 0.25 MG tablet Take 0.25 mg by mouth at bedtime as needed for anxiety.    Marland Kitchen aspirin 81 MG chewable tablet Chew 81 mg by mouth daily.    . fenofibrate (TRICOR) 145 MG tablet Take 1 tablet (145 mg total) by mouth daily. 90 tablet 3  . fish oil-omega-3 fatty acids 1000 MG capsule Take 2 g by mouth daily.      Marland Kitchen lisinopril (PRINIVIL,ZESTRIL) 40 MG tablet One tablet daily (Patient taking differently: Take 40 mg by mouth daily. One tablet daily) 90 tablet 3  . metFORMIN (GLUCOPHAGE-XR) 500 MG 24 hr tablet TAKE 2 TABLETS BY MOUTH EVERY MORNING 180 tablet 3  . nitroGLYCERIN (NITROSTAT) 0.4 MG SL tablet Place 0.4 mg under the tongue every 5 (five) minutes as needed for chest pain.    . rosuvastatin (CRESTOR) 5 MG tablet Take 1 tablet (5 mg total) by mouth daily. 90 tablet 3  .  spironolactone (ALDACTONE) 25 MG tablet Take 1 tablet (25 mg total) by mouth daily. 30 tablet 12   No current facility-administered medications on file prior to visit.     Review of Systems:  As per HPI- otherwise negative.   Physical Examination: There were no vitals filed for this visit. There were no vitals filed for this visit. There is no height or weight on file to calculate BMI. Ideal Body Weight:    GEN: WDWN, NAD, Non-toxic, A & O x 3 HEENT: Atraumatic, Normocephalic. Neck supple. No masses, No LAD. Ears and Nose: No external deformity. CV: RRR, No M/G/R. No JVD. No thrill. No extra heart sounds. PULM: CTA B, no wheezes, crackles, rhonchi. No retractions. No resp. distress. No accessory muscle use. ABD: S, NT, ND, +BS. No rebound. No HSM. EXTR: No c/c/e NEURO Normal gait.  PSYCH: Normally interactive. Conversant. Not depressed or anxious appearing.  Calm demeanor.    Assessment and Plan: ***  Signed Lamar Blinks, MD

## 2017-04-30 ENCOUNTER — Ambulatory Visit: Payer: Medicare Other | Admitting: Family Medicine

## 2017-05-01 DIAGNOSIS — Z23 Encounter for immunization: Secondary | ICD-10-CM | POA: Diagnosis not present

## 2017-05-01 DIAGNOSIS — L821 Other seborrheic keratosis: Secondary | ICD-10-CM | POA: Diagnosis not present

## 2017-05-01 DIAGNOSIS — L570 Actinic keratosis: Secondary | ICD-10-CM | POA: Diagnosis not present

## 2017-05-01 DIAGNOSIS — L814 Other melanin hyperpigmentation: Secondary | ICD-10-CM | POA: Diagnosis not present

## 2017-05-01 DIAGNOSIS — D1801 Hemangioma of skin and subcutaneous tissue: Secondary | ICD-10-CM | POA: Diagnosis not present

## 2017-05-01 DIAGNOSIS — D229 Melanocytic nevi, unspecified: Secondary | ICD-10-CM | POA: Diagnosis not present

## 2017-05-07 LAB — BASIC METABOLIC PANEL
BUN: 29 — AB (ref 4–21)
Creatinine: 1.1 (ref 0.6–1.3)
GLUCOSE: 133
Potassium: 4.4 (ref 3.4–5.3)
SODIUM: 140 (ref 137–147)

## 2017-05-07 LAB — LIPID PANEL
Cholesterol: 99 (ref 0–200)
HDL: 30 — AB (ref 35–70)
LDL CALC: 43
Triglycerides: 128 (ref 40–160)

## 2017-05-07 LAB — HEMOGLOBIN A1C: Hemoglobin A1C: 7.2

## 2017-05-07 LAB — HEPATIC FUNCTION PANEL
ALK PHOS: 63 (ref 25–125)
ALT: 27 (ref 10–40)
AST: 30 (ref 14–40)
Bilirubin, Total: 0.3

## 2017-05-07 LAB — CBC AND DIFFERENTIAL
HEMATOCRIT: 42 (ref 41–53)
HEMOGLOBIN: 14.2 (ref 13.5–17.5)
PLATELETS: 211 (ref 150–399)
WBC: 5.7

## 2017-05-07 LAB — TSH: TSH: 3.24 (ref 0.41–5.90)

## 2017-06-13 ENCOUNTER — Encounter: Payer: Self-pay | Admitting: Family Medicine

## 2017-06-27 ENCOUNTER — Encounter: Payer: Self-pay | Admitting: Family Medicine

## 2017-07-10 DIAGNOSIS — I480 Paroxysmal atrial fibrillation: Secondary | ICD-10-CM | POA: Diagnosis not present

## 2017-07-10 DIAGNOSIS — E7849 Other hyperlipidemia: Secondary | ICD-10-CM | POA: Diagnosis not present

## 2017-07-10 DIAGNOSIS — I252 Old myocardial infarction: Secondary | ICD-10-CM | POA: Diagnosis not present

## 2017-07-10 DIAGNOSIS — Z951 Presence of aortocoronary bypass graft: Secondary | ICD-10-CM | POA: Diagnosis not present

## 2017-07-10 DIAGNOSIS — G4733 Obstructive sleep apnea (adult) (pediatric): Secondary | ICD-10-CM | POA: Diagnosis not present

## 2017-07-10 DIAGNOSIS — E669 Obesity, unspecified: Secondary | ICD-10-CM | POA: Diagnosis not present

## 2017-07-10 DIAGNOSIS — I493 Ventricular premature depolarization: Secondary | ICD-10-CM | POA: Diagnosis not present

## 2017-07-10 DIAGNOSIS — I2510 Atherosclerotic heart disease of native coronary artery without angina pectoris: Secondary | ICD-10-CM | POA: Diagnosis not present

## 2017-07-10 DIAGNOSIS — I712 Thoracic aortic aneurysm, without rupture: Secondary | ICD-10-CM | POA: Diagnosis not present

## 2017-07-10 DIAGNOSIS — I119 Hypertensive heart disease without heart failure: Secondary | ICD-10-CM | POA: Diagnosis not present

## 2017-09-24 ENCOUNTER — Ambulatory Visit (INDEPENDENT_AMBULATORY_CARE_PROVIDER_SITE_OTHER): Payer: Medicare Other | Admitting: Family Medicine

## 2017-09-24 ENCOUNTER — Encounter: Payer: Self-pay | Admitting: Family Medicine

## 2017-09-24 VITALS — BP 118/75 | HR 72 | Temp 98.1°F | Ht 73.0 in | Wt 235.4 lb

## 2017-09-24 DIAGNOSIS — R5381 Other malaise: Secondary | ICD-10-CM | POA: Diagnosis not present

## 2017-09-24 DIAGNOSIS — R05 Cough: Secondary | ICD-10-CM | POA: Diagnosis not present

## 2017-09-24 DIAGNOSIS — J101 Influenza due to other identified influenza virus with other respiratory manifestations: Secondary | ICD-10-CM | POA: Diagnosis not present

## 2017-09-24 DIAGNOSIS — R059 Cough, unspecified: Secondary | ICD-10-CM

## 2017-09-24 LAB — POCT INFLUENZA A/B: INFLUENZA A, POC: POSITIVE — AB

## 2017-09-24 MED ORDER — HYDROCODONE-HOMATROPINE 5-1.5 MG/5ML PO SYRP: 5.0000 mL | ORAL_SOLUTION | Freq: Three times a day (TID) | ORAL | 0 refills | Status: DC | PRN

## 2017-09-24 MED ORDER — OSELTAMIVIR PHOSPHATE 75 MG PO CAPS: 75.0000 mg | ORAL_CAPSULE | Freq: Every day | ORAL | 0 refills | Status: DC

## 2017-09-24 NOTE — Patient Instructions (Signed)
You do have the flu You likely have tamiflu 75 mg at home- take this twice a day for 5 days. If you have something else let me know Use the cough syrup as needed- remember this will make you sleepy Please let me know if you don't improve in the next several days- Sooner if worse.

## 2017-09-24 NOTE — Progress Notes (Signed)
[place] at [facility] [address] [place], [place] [address] [phone]  Date:  09/24/2017   Name:  Perry [name]   DOB:  1946/04/16   MRN:  [id number]  PCP:  Darreld Mclean, MD    Chief Complaint: Cough (c/o cough and congestion since this past Saturday. )   History of Present Illness:  Perry [name] is a 72 y.o. very pleasant male patient who presents with the following:  Last seen by myself about a year ago for DM He was admitted by Jari Pigg in September with dizziness and PVCs He notes onset of illness this past Saturday- today is Monday He had a cough, chills, headache, possible fever  He is not sleeping well due to cough Cough is not productive No vomiting or diarrhea  Lab Results  Component Value Date   HGBA1C 7.2 05/07/2017     Patient Active Problem List   Diagnosis Date Noted  . Bradycardia 04/16/2017  . Acute appendicitis with perforation and peritoneal abscess 05/04/2014  . Abscess of abdominal cavity ([place]) 05/04/2014  . Atrial fibrillation ([place]) 04/28/2014  . Hyperlipidemia   . Sleep apnea   . CAD (coronary artery disease)   . Hearing loss   . Diabetes mellitus     Past Medical History:  Diagnosis Date  . A-fib ([place]) 04/2014  . Abscess of abdominal cavity ([place]) 05/04/2014  . Acute appendicitis with perforation and peritoneal abscess 05/04/2014  . Allergy   . CAD (coronary artery disease)   . CAD (coronary artery disease) of artery bypass graft 05/04/2014  . CHF (congestive heart failure) ([place])    pt not aware  . Diabetes mellitus    not on medication - pt not aware of this  . Dyslipidemia 05/04/2014  . Hearing loss    mild  . Hernia, inguinal    right  . Hyperglycemia   . Hypertension   . Ileus, postoperative ([place]) 05/04/2014  . Lumbar disc disease   . Old inferior wall myocardial infarction 2004    Past Surgical History:  Procedure Laterality Date  . COLONOSCOPY    .  CORONARY ANGIOPLASTY     multiple  . CORONARY ARTERY BYPASS GRAFT  2004   LIMA to LAD and SVG to RCA (emergent)  . LAPAROSCOPIC APPENDECTOMY N/A 04/21/2014   Procedure: APPENDECTOMY LAPAROSCOPIC;  Surgeon: Rolm Bookbinder, MD;  Location: [place];  Service: General;  Laterality: N/A;  . LAPAROSCOPIC APPENDECTOMY N/A 06/24/2014   Procedure: APPENDECTOMY LAPAROSCOPIC ;  Surgeon: Rolm Bookbinder, MD;  Location: [place];  Service: General;  Laterality: N/A;  . LUMBAR LAMINECTOMY  2001   lumbar  . TONSILLECTOMY      Social History   Tobacco Use  . Smoking status: Former Smoker    Types: Cigarettes    Last attempt to quit: 06/24/1979    Years since quitting: 38.2  . Smokeless tobacco: Former Systems developer    Types: Chew  Substance Use Topics  . Alcohol use: Yes    Alcohol/week: 8.4 oz    Types: 14 Cans of beer per week    Comment: 2 drinks a day  . Drug use: No    Family History  Problem Relation Age of Onset  . Heart disease Father   . Heart disease Brother   . Hypertension Brother   . Cancer Sister   . Heart disease Paternal Grandmother     Allergies  Allergen Reactions  .  Xanax [Alprazolam] Shortness Of Breath    Medication list has been reviewed and updated.  Current Outpatient Medications on File Prior to Visit  Medication Sig Dispense Refill  . ALPRAZolam (XANAX) 0.25 MG tablet Take 0.25 mg by mouth at bedtime as needed for anxiety.    Marland Kitchen aspirin 81 MG chewable tablet Chew 81 mg by mouth daily.    . fenofibrate (TRICOR) 145 MG tablet Take 1 tablet (145 mg total) by mouth daily. 90 tablet 3  . fish oil-omega-3 fatty acids 1000 MG capsule Take 2 g by mouth daily.      Marland Kitchen lisinopril (PRINIVIL,ZESTRIL) 40 MG tablet One tablet daily (Patient taking differently: Take 40 mg by mouth daily. One tablet daily) 90 tablet 3  . metFORMIN (GLUCOPHAGE-XR) 500 MG 24 hr tablet TAKE 2 TABLETS BY MOUTH EVERY MORNING 180 tablet 3  . nitroGLYCERIN (NITROSTAT) 0.4 MG SL tablet Place 0.4 mg under  the tongue every 5 (five) minutes as needed for chest pain.    . rosuvastatin (CRESTOR) 5 MG tablet Take 1 tablet (5 mg total) by mouth daily. 90 tablet 3  . spironolactone (ALDACTONE) 25 MG tablet Take 1 tablet (25 mg total) by mouth daily. 30 tablet 12   No current facility-administered medications on file prior to visit.     Review of Systems:  As per HPI- otherwise negative. He did not check his temp at home but felt like he had a fever He has noted a ST as well  Physical Examination: Vitals:   09/24/17 1227  BP: 118/75  Pulse: 72  Temp: 98.1 F (36.7 C)  SpO2: 98%   Vitals:   09/24/17 1227  Weight: 235 lb 6.4 oz (106.8 kg)  Height: 6\' 1"  (1.854 m)   Body mass index is 31.06 kg/m. Ideal Body Weight: Weight in (lb) to have BMI = 25: 189.1  GEN: WDWN, NAD, Non-toxic, A & O x 3, overweight, looks well HEENT: Atraumatic, Normocephalic. Neck supple. No masses, No LAD.  Bilateral TM wnl, oropharynx normal.  PEERL,EOMI.   Ears and Nose: No external deformity. CV: RRR, No M/G/R. No JVD. No thrill. No extra heart sounds. PULM: CTA B, no wheezes, crackles, rhonchi. No retractions. No resp. distress. No accessory muscle use. ABD: S, NT, ND, +BS. No rebound. No HSM. EXTR: No c/c/e NEURO Normal gait.  PSYCH: Normally interactive. Conversant. Not depressed or anxious appearing.  Calm demeanor.   Results for orders placed or performed in visit on 09/24/17  POCT Influenza A/B  Result Value Ref Range   Influenza A, POC Positive (A) Negative   Influenza B, POC  Negative     Assessment and Plan: Influenza A - Plan: HYDROcodone-homatropine (HYCODAN) 5-1.5 MG/5ML syrup, oseltamivir (TAMIFLU) 75 MG capsule  Cough - Plan: POCT Influenza A/B  Malaise - Plan: POCT Influenza A/B  Flu a Treat with tamiflu and hycodan prn- he thought he had some tamiflu at home but it was expired so I rx for him He is no longer on xanax- cautioned re sedation with hycodan He will let me know if not  feeling better soon   Signed Lamar Blinks, MD
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
## 2017-10-02 ENCOUNTER — Other Ambulatory Visit: Payer: Self-pay | Admitting: Family Medicine

## 2017-10-02 DIAGNOSIS — J101 Influenza due to other identified influenza virus with other respiratory manifestations: Secondary | ICD-10-CM

## 2017-10-02 MED ORDER — HYDROCODONE-HOMATROPINE 5-1.5 MG/5ML PO SYRP: 5.0000 mL | ORAL_SOLUTION | Freq: Three times a day (TID) | ORAL | 0 refills | Status: DC | PRN

## 2017-10-02 NOTE — Telephone Encounter (Signed)
Advised patient, I sent a refill, please advised patient, it will cause drowsiness.

## 2017-10-02 NOTE — Telephone Encounter (Signed)
Patient is calling back in regards to getting this cough medicine refilled. 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 Informed patient it can take up to 3 business days.

## 2017-10-02 NOTE — Telephone Encounter (Signed)
Hydrocodone-homatropine LOV: 09/24/17 PCP: [place]: [facility]

## 2017-10-02 NOTE — Telephone Encounter (Signed)
Please advise in PCP absence.  

## 2017-10-02 NOTE — Telephone Encounter (Signed)
LMOM informing Pt that Rx has been sent and informed of drowsiness side effect, recommend he try Mucinex DM or Robitussin DM during the daytime.

## 2017-10-02 NOTE — Telephone Encounter (Signed)
Copied from [place] [phone]. Topic: Quick Communication - Rx Refill/Question >> Oct 02, 2017  8:00 AM Synthia Innocent wrote: Medication: HYDROcodone-homatropine (HYCODAN) 5-1.5 MG/5ML syrup   Has the patient contacted their pharmacy? Yes.     (Agent: If no, request that the patient contact the pharmacy for the refill.)   Preferred Pharmacy (with phone number or street name): [facility]   Agent: Please be advised that RX refills may take up to 3 business days. We ask that you follow-up with your pharmacy.
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
## 2017-10-08 ENCOUNTER — Ambulatory Visit (HOSPITAL_BASED_OUTPATIENT_CLINIC_OR_DEPARTMENT_OTHER)
Admission: RE | Admit: 2017-10-08 | Discharge: 2017-10-08 | Disposition: A | Discharge status: Home / Self Care | Payer: Medicare Other | Source: Ambulatory Visit | Attending: Family Medicine | Admitting: Family Medicine

## 2017-10-08 ENCOUNTER — Encounter: Payer: Self-pay | Admitting: *Deleted

## 2017-10-08 ENCOUNTER — Ambulatory Visit (INDEPENDENT_AMBULATORY_CARE_PROVIDER_SITE_OTHER): Payer: Medicare Other | Admitting: Family Medicine

## 2017-10-08 ENCOUNTER — Encounter: Payer: Self-pay | Admitting: Family Medicine

## 2017-10-08 ENCOUNTER — Ambulatory Visit (INDEPENDENT_AMBULATORY_CARE_PROVIDER_SITE_OTHER): Payer: Medicare Other | Admitting: *Deleted

## 2017-10-08 VITALS — BP 120/74 | HR 58 | Temp 98.8°F | Resp 16 | Wt 235.0 lb

## 2017-10-08 VITALS — BP 120/74 | HR 58 | Wt 235.6 lb

## 2017-10-08 DIAGNOSIS — E119 Type 2 diabetes mellitus without complications: Secondary | ICD-10-CM

## 2017-10-08 DIAGNOSIS — E782 Mixed hyperlipidemia: Secondary | ICD-10-CM | POA: Diagnosis not present

## 2017-10-08 DIAGNOSIS — R05 Cough: Secondary | ICD-10-CM | POA: Diagnosis not present

## 2017-10-08 DIAGNOSIS — R972 Elevated prostate specific antigen [PSA]: Secondary | ICD-10-CM | POA: Diagnosis not present

## 2017-10-08 DIAGNOSIS — R937 Abnormal findings on diagnostic imaging of other parts of musculoskeletal system: Secondary | ICD-10-CM | POA: Insufficient documentation

## 2017-10-08 DIAGNOSIS — I25700 Atherosclerosis of coronary artery bypass graft(s), unspecified, with unstable angina pectoris: Secondary | ICD-10-CM

## 2017-10-08 DIAGNOSIS — Z8781 Personal history of (healed) traumatic fracture: Secondary | ICD-10-CM | POA: Insufficient documentation

## 2017-10-08 DIAGNOSIS — Z125 Encounter for screening for malignant neoplasm of prostate: Secondary | ICD-10-CM | POA: Diagnosis not present

## 2017-10-08 DIAGNOSIS — Z5181 Encounter for therapeutic drug level monitoring: Secondary | ICD-10-CM | POA: Diagnosis not present

## 2017-10-08 DIAGNOSIS — R0781 Pleurodynia: Secondary | ICD-10-CM | POA: Insufficient documentation

## 2017-10-08 DIAGNOSIS — I10 Essential (primary) hypertension: Secondary | ICD-10-CM | POA: Diagnosis not present

## 2017-10-08 DIAGNOSIS — F5104 Psychophysiologic insomnia: Secondary | ICD-10-CM | POA: Diagnosis not present

## 2017-10-08 DIAGNOSIS — Z0000 Encounter for general adult medical examination without abnormal findings: Secondary | ICD-10-CM | POA: Diagnosis not present

## 2017-10-08 DIAGNOSIS — R0789 Other chest pain: Secondary | ICD-10-CM

## 2017-10-08 LAB — LIPID PANEL
Cholesterol: 117 mg/dL (ref 0–200)
HDL: 35 mg/dL — ABNORMAL LOW (ref >39.00)
LDL CALC: 54 mg/dL (ref 0–99)
NONHDL: 82.25
Total CHOL/HDL Ratio: 3
Triglycerides: 141 mg/dL (ref 0.0–149.0)
VLDL: 28.2 mg/dL (ref 0.0–40.0)

## 2017-10-08 LAB — PSA, MEDICARE: PSA: 3.53 ng/ml (ref 0.10–4.00)

## 2017-10-08 LAB — HEMOGLOBIN A1C: Hgb A1c MFr Bld: 7.2 % — ABNORMAL HIGH (ref 4.6–6.5)

## 2017-10-08 LAB — BASIC METABOLIC PANEL
BUN: 31 mg/dL — AB (ref 6–23)
CHLORIDE: 103 meq/L (ref 96–112)
CO2: 27 meq/L (ref 19–32)
Calcium: 10 mg/dL (ref 8.4–10.5)
Creatinine, Ser: 1.03 mg/dL (ref 0.40–1.50)
GFR: 75.44 mL/min (ref >60.00)
GLUCOSE: 127 mg/dL — AB (ref 70–99)
POTASSIUM: 4.6 meq/L (ref 3.5–5.1)
SODIUM: 135 meq/L (ref 135–145)

## 2017-10-08 MED ORDER — TRAZODONE HCL 50 MG PO TABS: 25.0000 mg | ORAL_TABLET | Freq: Every evening | ORAL | 9 refills | Status: DC | PRN

## 2017-10-08 NOTE — Patient Instructions (Signed)
Good to see you again today!  Please do get your eye exam soon I will be in touch with your labs We will x-ray your left ribs today to look for any sign of recurrent fracture  Try the trazodone for sleep- take a 1/2 tablet before bed, increase to a whole tablet as needed

## 2017-10-08 NOTE — Patient Instructions (Addendum)
Follow up with Dr.Copland today as scheduled  Continue to eat heart healthy diet (full of fruits, vegetables, whole grains, lean protein, water--limit salt, fat, and sugar intake) and increase physical activity as tolerated.  Continue doing brain stimulating activities (puzzles, reading, adult coloring books, staying active) to keep memory sharp.   Bring a copy of your living will and/or healthcare power of attorney to your next office visit.  Drink more water and try a teaspoon of mustard or vinegar for cramps at night.    Perry [name] , Thank you for taking time to come for your Medicare Wellness Visit. I appreciate your ongoing commitment to your health goals. Please review the following plan we discussed and let me know if I can assist you in the future.   These are the goals we discussed: Goals    . Continue to eat healthy and lose weight       This is a list of the screening recommended for you and due dates:  Health Maintenance  Topic Date Due  . Complete foot exam   09/16/1955  . Eye exam for diabetics  09[birth year]16/1955  . Hemoglobin A1C  11/05/2017  . Tetanus Vaccine  10/14/2018  . Colon Cancer Screening  10/30/2020  . Flu Shot  Completed  .  Hepatitis C: One time screening is recommended by Center for Disease Control  (CDC) for  adults born from [birth year] through 1965.   Completed  . Pneumonia vaccines  Completed     Health Maintenance, Male A healthy lifestyle and preventive care is important for your health and wellness. Ask your health care provider about what schedule of regular examinations is right for you. What should I know about weight and diet? Eat a Healthy Diet  Eat plenty of vegetables, fruits, whole grains, low-fat dairy products, and lean protein.  Do not eat a lot of foods high in solid fats, added sugars, or salt.  Maintain a Healthy Weight Regular exercise can help you achieve or maintain a healthy weight. You should:  Do at least 150 minutes of exercise  each week. The exercise should increase your heart rate and make you sweat (moderate-intensity exercise).  Do strength-training exercises at least twice a week.  Watch Your Levels of Cholesterol and Blood Lipids  Have your blood tested for lipids and cholesterol every 5 years starting at 72 years of age. If you are at high risk for heart disease, you should start having your blood tested when you are 72 years old. You may need to have your cholesterol levels checked more often if: ? Your lipid or cholesterol levels are high. ? You are older than 72 years of age. ? You are at high risk for heart disease.  What should I know about cancer screening? Many types of cancers can be detected early and may often be prevented. Lung Cancer  You should be screened every year for lung cancer if: ? You are a current smoker who has smoked for at least 30 years. ? You are a former smoker who has quit within the past 15 years.  Talk to your health care provider about your screening options, when you should start screening, and how often you should be screened.  Colorectal Cancer  Routine colorectal cancer screening usually begins at 72 years of age and should be repeated every 5-10 years until you are 72 years old. You may need to be screened more often if early forms of precancerous polyps or small growths are found.  Your health care provider may recommend screening at an earlier age if you have risk factors for colon cancer.  Your health care provider may recommend using home test kits to check for hidden blood in the stool.  A small camera at the end of a tube can be used to examine your colon (sigmoidoscopy or colonoscopy). This checks for the earliest forms of colorectal cancer.  Prostate and Testicular Cancer  Depending on your age and overall health, your health care provider may do certain tests to screen for prostate and testicular cancer.  Talk to your health care provider about any  symptoms or concerns you have about testicular or prostate cancer.  Skin Cancer  Check your skin from head to toe regularly.  Tell your health care provider about any new moles or changes in moles, especially if: ? There is a change in a mole's size, shape, or color. ? You have a mole that is larger than a pencil eraser.  Always use sunscreen. Apply sunscreen liberally and repeat throughout the day.  Protect yourself by wearing long sleeves, pants, a wide-brimmed hat, and sunglasses when outside.  What should I know about heart disease, diabetes, and high blood pressure?  If you are 62-80 years of age, have your blood pressure checked every 3-5 years. If you are 63 years of age or older, have your blood pressure checked every year. You should have your blood pressure measured twice-once when you are at a hospital or clinic, and once when you are not at a hospital or clinic. Record the average of the two measurements. To check your blood pressure when you are not at a hospital or clinic, you can use: ? An automated blood pressure machine at a pharmacy. ? A home blood pressure monitor.  Talk to your health care provider about your target blood pressure.  If you are between 40-36 years old, ask your health care provider if you should take aspirin to prevent heart disease.  Have regular diabetes screenings by checking your fasting blood sugar level. ? If you are at a normal weight and have a low risk for diabetes, have this test once every three years after the age of 75. ? If you are overweight and have a high risk for diabetes, consider being tested at a younger age or more often.  A one-time screening for abdominal aortic aneurysm (AAA) by ultrasound is recommended for men aged 2-75 years who are current or former smokers. What should I know about preventing infection? Hepatitis B If you have a higher risk for hepatitis B, you should be screened for this virus. Talk with your health  care provider to find out if you are at risk for hepatitis B infection. Hepatitis C Blood testing is recommended for:  Everyone born from [birth year] through 1965.  Anyone with known risk factors for hepatitis C.  Sexually Transmitted Diseases (STDs)  You should be screened each year for STDs including gonorrhea and chlamydia if: ? You are sexually active and are younger than 72 years of age. ? You are older than 72 years of age and your health care provider tells you that you are at risk for this type of infection. ? Your sexual activity has changed since you were last screened and you are at an increased risk for chlamydia or gonorrhea. Ask your health care provider if you are at risk.  Talk with your health care provider about whether you are at high risk of being infected with HIV.  Your health care provider may recommend a prescription medicine to help prevent HIV infection.  What else can I do?  Schedule regular health, dental, and eye exams.  Stay current with your vaccines (immunizations).  Do not use any tobacco products, such as cigarettes, chewing tobacco, and e-cigarettes. If you need help quitting, ask your health care provider.  Limit alcohol intake to no more than 2 drinks per day. One drink equals 12 ounces of beer, 5 ounces of wine, or 1 ounces of hard liquor.  Do not use street drugs.  Do not share needles.  Ask your health care provider for help if you need support or information about quitting drugs.  Tell your health care provider if you often feel depressed.  Tell your health care provider if you have ever been abused or do not feel safe at home. This information is not intended to replace advice given to you by your health care provider. Make sure you discuss any questions you have with your health care provider. Document Released: 01/20/2008 Document Revised: 03/22/2016 Document Reviewed: 04/27/2015 Elsevier Interactive Patient Education  [employer].

## 2017-10-09 ENCOUNTER — Encounter: Payer: Self-pay | Admitting: Family Medicine

## 2017-10-09 NOTE — Addendum Note (Signed)
Addended by: Lamar Blinks C on: 10/09/2017 06:33 AM   Modules accepted: Orders

## 2017-10-12 ENCOUNTER — Telehealth: Payer: Self-pay | Admitting: Family Medicine

## 2017-10-12 NOTE — Telephone Encounter (Signed)
-----   Message from Sallyanne Havers sent at 10/11/2017 10:25 AM EST ----- Regarding: Needs sign Dr Lorelei Pont,  Can you please sign off on Medicare AWV from 10/08/17.  Thank you, Tiffany Decker,CPC

## 2017-10-17 ENCOUNTER — Telehealth: Payer: Self-pay | Admitting: *Deleted

## 2017-10-17 NOTE — Telephone Encounter (Signed)
Received Medical records from [facility]; forwarded to provider/SLS 03/13

## 2017-11-28 DIAGNOSIS — N400 Enlarged prostate without lower urinary tract symptoms: Secondary | ICD-10-CM | POA: Diagnosis not present

## 2018-01-29 DIAGNOSIS — I119 Hypertensive heart disease without heart failure: Secondary | ICD-10-CM | POA: Diagnosis not present

## 2018-01-29 DIAGNOSIS — Z951 Presence of aortocoronary bypass graft: Secondary | ICD-10-CM | POA: Diagnosis not present

## 2018-01-29 DIAGNOSIS — I493 Ventricular premature depolarization: Secondary | ICD-10-CM | POA: Diagnosis not present

## 2018-01-29 DIAGNOSIS — I712 Thoracic aortic aneurysm, without rupture: Secondary | ICD-10-CM | POA: Diagnosis not present

## 2018-01-29 DIAGNOSIS — E669 Obesity, unspecified: Secondary | ICD-10-CM | POA: Diagnosis not present

## 2018-01-29 DIAGNOSIS — G4733 Obstructive sleep apnea (adult) (pediatric): Secondary | ICD-10-CM | POA: Diagnosis not present

## 2018-01-29 DIAGNOSIS — I480 Paroxysmal atrial fibrillation: Secondary | ICD-10-CM | POA: Diagnosis not present

## 2018-01-29 DIAGNOSIS — I252 Old myocardial infarction: Secondary | ICD-10-CM | POA: Diagnosis not present

## 2018-01-29 DIAGNOSIS — E7849 Other hyperlipidemia: Secondary | ICD-10-CM | POA: Diagnosis not present

## 2018-01-29 DIAGNOSIS — E785 Hyperlipidemia, unspecified: Secondary | ICD-10-CM | POA: Diagnosis not present

## 2018-01-29 DIAGNOSIS — I2510 Atherosclerotic heart disease of native coronary artery without angina pectoris: Secondary | ICD-10-CM | POA: Diagnosis not present

## 2018-01-30 DIAGNOSIS — M19019 Primary osteoarthritis, unspecified shoulder: Secondary | ICD-10-CM | POA: Diagnosis not present

## 2018-01-30 DIAGNOSIS — M19012 Primary osteoarthritis, left shoulder: Secondary | ICD-10-CM | POA: Diagnosis not present

## 2018-01-30 DIAGNOSIS — M25512 Pain in left shoulder: Secondary | ICD-10-CM | POA: Diagnosis not present

## 2018-03-08 ENCOUNTER — Telehealth: Payer: Self-pay

## 2018-03-08 NOTE — Telephone Encounter (Signed)
Copied from [place] [id number]. Topic: Quick Communication - See Telephone Encounter >> Mar 07, 2018  2:55 PM Hewitt Shorts wrote: Pt is needing to talk with someone about his shoulder the ortho office states it is arthritis and that he recommends celebrex for the pain if the pcp could give him an rx for that \  Best number [phone]

## 2018-03-09 ENCOUNTER — Other Ambulatory Visit: Payer: Self-pay | Admitting: Family Medicine

## 2018-03-09 ENCOUNTER — Encounter: Payer: Self-pay | Admitting: Family Medicine

## 2018-03-15 ENCOUNTER — Encounter: Payer: Self-pay | Admitting: Family Medicine

## 2018-03-15 MED ORDER — CELECOXIB 100 MG PO CAPS: 100.0000 mg | ORAL_CAPSULE | Freq: Two times a day (BID) | ORAL | 1 refills | Status: DC

## 2018-05-03 ENCOUNTER — Other Ambulatory Visit: Payer: Self-pay | Admitting: Cardiology

## 2018-05-03 DIAGNOSIS — I712 Thoracic aortic aneurysm, without rupture, unspecified: Secondary | ICD-10-CM

## 2018-05-10 ENCOUNTER — Ambulatory Visit
Admission: RE | Admit: 2018-05-10 | Discharge: 2018-05-10 | Disposition: A | Discharge status: Home / Self Care | Payer: Medicare Other | Source: Ambulatory Visit | Attending: Cardiology | Admitting: Cardiology

## 2018-05-10 DIAGNOSIS — I712 Thoracic aortic aneurysm, without rupture, unspecified: Secondary | ICD-10-CM

## 2018-05-29 ENCOUNTER — Encounter: Payer: Self-pay | Admitting: Cardiology

## 2018-05-29 ENCOUNTER — Ambulatory Visit (INDEPENDENT_AMBULATORY_CARE_PROVIDER_SITE_OTHER): Payer: Medicare Other | Admitting: Cardiology

## 2018-05-29 VITALS — BP 138/74 | HR 58 | Ht 73.0 in | Wt 238.4 lb

## 2018-05-29 DIAGNOSIS — E782 Mixed hyperlipidemia: Secondary | ICD-10-CM

## 2018-05-29 DIAGNOSIS — I712 Thoracic aortic aneurysm, without rupture, unspecified: Secondary | ICD-10-CM | POA: Insufficient documentation

## 2018-05-29 DIAGNOSIS — I2510 Atherosclerotic heart disease of native coronary artery without angina pectoris: Secondary | ICD-10-CM

## 2018-05-29 DIAGNOSIS — I480 Paroxysmal atrial fibrillation: Secondary | ICD-10-CM

## 2018-05-29 NOTE — Patient Instructions (Signed)
Medication Instructions:  Your physician recommends that you continue on your current medications as directed. Please refer to the Current Medication list given to you today.  If you need a refill on your cardiac medications before your next appointment, please call your pharmacy.   Lab work: Your physician recommends that you have lab work yoday: Lipid panel  If you have labs (blood work) drawn today and your tests are completely normal, you will receive your results only by: Marland Kitchen MyChart Message (if you have MyChart) OR . A paper copy in the mail If you have any lab test that is abnormal or we need to change your treatment, we will call you to review the results.  Testing/Procedures: None ordered  Follow-Up: At [facility], you and your health needs are our priority.  As part of our continuing mission to provide you with exceptional heart care, we have created designated Provider Care Teams.  These Care Teams include your primary Cardiologist (physician) and Advanced Practice Providers (APPs -  Physician Assistants and Nurse Practitioners) who all work together to provide you with the care you need, when you need it. You will need a follow up appointment in 6 months.  Please call our office 2 months in advance to schedule this appointment.  You may see Dr. Wynonia Lawman or Dr. Agustin Cree or another member of our [place] Provider Team in [place]: Shirlee More, MD . Jyl Heinz, MD  Any Other Special Instructions Will Be Listed Below (If Applicable).

## 2018-05-29 NOTE — Progress Notes (Signed)
Cardiology Office Note:    Date:  05/29/2018   ID:  [name], DOB 07-Apr-1946, MRN [id number]  PCP:  Darreld Mclean, MD  Cardiologist:  Jenne Campus, MD    Referring MD: Darreld Mclean, MD   Chief Complaint  Patient presents with  . Follow up on testing  Doing well  History of Present Illness:    Perry [name] is a 72 y.o. male with coronary artery disease, status post coronary artery bypass graft.  Also thoracic aortic aneurysm, hypertension, diabetes, dyslipidemia overall clinically doing very well no chest pain tightness squeezing pressure burning chest.  Still work walks a lot with no difficulties.  No palpitations no passing out  Past Medical History:  Diagnosis Date  . A-fib ([place]) 04/2014  . Abscess of abdominal cavity ([place]) 05/04/2014  . Acute appendicitis with perforation and peritoneal abscess 05/04/2014  . Allergy   . CAD (coronary artery disease)   . CAD (coronary artery disease) of artery bypass graft 05/04/2014  . CHF (congestive heart failure) ([place])    pt not aware  . Diabetes mellitus    not on medication - pt not aware of this  . Dyslipidemia 05/04/2014  . Hearing loss    mild  . Hernia, inguinal    right  . Hyperglycemia   . Hypertension   . Ileus, postoperative ([place]) 05/04/2014  . Lumbar disc disease   . Old inferior wall myocardial infarction 2004    Past Surgical History:  Procedure Laterality Date  . COLONOSCOPY    . CORONARY ANGIOPLASTY     multiple  . CORONARY ARTERY BYPASS GRAFT  2004   LIMA to LAD and SVG to RCA (emergent)  . LAPAROSCOPIC APPENDECTOMY N/A 04/21/2014   Procedure: APPENDECTOMY LAPAROSCOPIC;  Surgeon: Rolm Bookbinder, MD;  Location: [place];  Service: General;  Laterality: N/A;  . LAPAROSCOPIC APPENDECTOMY N/A 06/24/2014   Procedure: APPENDECTOMY LAPAROSCOPIC ;  Surgeon: Rolm Bookbinder, MD;  Location: [place];  Service: General;  Laterality: N/A;  . LUMBAR LAMINECTOMY  2001   lumbar  . TONSILLECTOMY       Current Medications: Current Meds  Medication Sig  . aspirin 81 MG chewable tablet Chew 81 mg by mouth daily.  . fenofibrate (TRICOR) 145 MG tablet Take 1 tablet (145 mg total) by mouth daily.  . fish oil-omega-3 fatty acids 1000 MG capsule Take 2 g by mouth daily.    Marland Kitchen lisinopril (PRINIVIL,ZESTRIL) 40 MG tablet One tablet daily (Patient taking differently: Take 40 mg by mouth daily. One tablet daily)  . metFORMIN (GLUCOPHAGE-XR) 500 MG 24 hr tablet TAKE TWO TABLETS BY MOUTH every morning  . nitroGLYCERIN (NITROSTAT) 0.4 MG SL tablet Place 0.4 mg under the tongue every 5 (five) minutes as needed for chest pain.  . rosuvastatin (CRESTOR) 5 MG tablet Take 1 tablet (5 mg total) by mouth daily.  Marland Kitchen spironolactone (ALDACTONE) 25 MG tablet Take 1 tablet (25 mg total) by mouth daily.     Allergies:   Xanax [alprazolam]   Social History   Socioeconomic History  . Marital status: Married    Spouse name: [name]  . Number of children: 2  . Years of education: Masters  . Highest education level: Not on file  Occupational History  . Occupation: Clinical biochemist  Social Needs  . Financial resource strain: Not on file  . Food insecurity:    Worry: Not on file    Inability: Not on file  . Transportation needs:  Medical: Not on file    Non-medical: Not on file  Tobacco Use  . Smoking status: Former Smoker    Types: Cigarettes    Last attempt to quit: 06/24/1979    Years since quitting: 38.9  . Smokeless tobacco: Former Systems developer    Types: Chew  Substance and Sexual Activity  . Alcohol use: Yes    Alcohol/week: 14.0 standard drinks    Types: 14 Cans of beer per week    Comment: 2 drinks a day  . Drug use: No  . Sexual activity: Yes  Lifestyle  . Physical activity:    Days per week: Not on file    Minutes per session: Not on file  . Stress: Not on file  Relationships  . Social connections:    Talks on phone: Not on file    Gets together: Not on file    Attends religious  service: Not on file    Active member of club or organization: Not on file    Attends meetings of clubs or organizations: Not on file    Relationship status: Not on file  Other Topics Concern  . Not on file  Social History Narrative   Patient is married [name]) and lives at home with his wife.   Patient has two adult children.   Patient is working full-time.   Patient has a Master's degree.   Patient drinks one cup of coffee every morning.   Patient is right-handed.     Family History: The patient's family history includes Cancer in his sister; Heart disease in his brother, father, and paternal grandmother; Hypertension in his brother. ROS:   Please see the history of present illness.    All 14 point review of systems negative except as described per history of present illness  EKGs/Labs/Other Studies Reviewed:      Recent Labs: 10/08/2017: BUN 31; Creatinine, Ser 1.03; Potassium 4.6; Sodium 135  Recent Lipid Panel    Component Value Date/Time   CHOL 117 10/08/2017 0941   TRIG 141.0 10/08/2017 0941   HDL 35.00 (L) 10/08/2017 0941   CHOLHDL 3 10/08/2017 0941   VLDL 28.2 10/08/2017 0941   LDLCALC 54 10/08/2017 0941    Physical Exam:    VS:  BP 138/74   Pulse (!) 58   Ht 6\' 1"  (1.854 m)   Wt 238 lb 6.4 oz (108.1 kg)   SpO2 98%   BMI 31.45 kg/m     Wt Readings from Last 3 Encounters:  05/29/18 238 lb 6.4 oz (108.1 kg)  10/08/17 235 lb (106.6 kg)  10/08/17 235 lb 9.6 oz (106.9 kg)     GEN:  Well nourished, well developed in no acute distress HEENT: Normal NECK: No JVD; No carotid bruits LYMPHATICS: No lymphadenopathy CARDIAC: RRR, no murmurs, no rubs, no gallops RESPIRATORY:  Clear to auscultation without rales, wheezing or rhonchi  ABDOMEN: Soft, non-tender, non-distended MUSCULOSKELETAL:  No edema; No deformity  SKIN: Warm and dry LOWER EXTREMITIES: no swelling NEUROLOGIC:  Alert and oriented x 3 PSYCHIATRIC:  Normal affect   ASSESSMENT:    1. Mixed  hyperlipidemia   2. Coronary artery disease involving native coronary artery of native heart without angina pectoris   3. Paroxysmal atrial fibrillation (HCC)   4. Thoracic aortic aneurysm without rupture ([place])    PLAN:    In order of problems listed above:  1. Coronary artery disease status post coronary artery bypass graft asymptomatic and doing very well.  On appropriate medications. 2.  Paroxysmal atrial fibrillation only one documented episode in 2015 after appendectomy.  Not anticoagulated. 3. Thoracic artery aneurysm measuring 4.2 cm.  Will require yearly CT of his chest. 4. Dyslipidemia we will check his fasting lipid profile today.  Overall gentleman is doing very well very active asymptomatic excellent management by Dr. Oran Rein   Medication Adjustments/Labs and Tests Ordered: Current medicines are reviewed at length with the patient today.  Concerns regarding medicines are outlined above.  Orders Placed This Encounter  Procedures  . Lipid panel   Medication changes: No orders of the defined types were placed in this encounter.   Signed, Park Liter, MD, [facility] 05/29/2018 10:30 AM    [place]

## 2018-05-30 LAB — LIPID PANEL
Chol/HDL Ratio: 3 ratio (ref 0.0–5.0)
Cholesterol, Total: 123 mg/dL (ref 100–199)
HDL: 41 mg/dL (ref >39)
LDL CALC: 56 mg/dL (ref 0–99)
Triglycerides: 130 mg/dL (ref 0–149)
VLDL CHOLESTEROL CAL: 26 mg/dL (ref 5–40)

## 2018-05-31 ENCOUNTER — Telehealth: Payer: Self-pay | Admitting: Cardiology

## 2018-05-31 MED ORDER — ROSUVASTATIN CALCIUM 5 MG PO TABS: 5.0000 mg | ORAL_TABLET | Freq: Every day | ORAL | 3 refills | Status: DC

## 2018-05-31 NOTE — Telephone Encounter (Signed)
Refilled Rosuvastatin 5 mg daily. Confirmed with patient this is correct dose.

## 2018-05-31 NOTE — Addendum Note (Signed)
Addended by: Ashok Norris on: 05/31/2018 01:51 PM   Modules accepted: Orders

## 2018-05-31 NOTE — Telephone Encounter (Signed)
° ° °  1. Which medications need to be refilled? (please list name of each medication and dose if known) rosuvastatin 10mg   2. Which pharmacy/location (including street and city if local pharmacy) is medication to be sent to? [facility]  3. Do they need a 30 day or 90 day supply? [place]

## 2018-06-07 ENCOUNTER — Other Ambulatory Visit: Payer: Self-pay | Admitting: Family Medicine

## 2018-07-01 DIAGNOSIS — Z23 Encounter for immunization: Secondary | ICD-10-CM | POA: Diagnosis not present

## 2018-07-02 DIAGNOSIS — Z23 Encounter for immunization: Secondary | ICD-10-CM | POA: Diagnosis not present

## 2018-10-14 DIAGNOSIS — K1329 Other disturbances of oral epithelium, including tongue: Secondary | ICD-10-CM | POA: Diagnosis not present

## 2018-10-18 ENCOUNTER — Telehealth: Payer: Self-pay | Admitting: Cardiology

## 2018-10-18 NOTE — Telephone Encounter (Signed)
Fine with me

## 2018-10-18 NOTE — Telephone Encounter (Signed)
New message    Patient is requesting to change from Dr Agustin Cree to Dr Tamala Julian due to location.

## 2018-10-18 NOTE — Telephone Encounter (Signed)
Okay with me 

## 2018-12-06 ENCOUNTER — Telehealth: Payer: Self-pay | Admitting: Interventional Cardiology

## 2018-12-06 NOTE — Telephone Encounter (Signed)
Left message on vm for patient to call back regarding his appt for 5/7 needs to get consent and to switch to virtual visit @330pm 

## 2018-12-09 NOTE — Telephone Encounter (Signed)
° ° °  Patient states he will call back to confirm VIDEO or TELEPHONE visit preference

## 2018-12-11 NOTE — Progress Notes (Signed)
Virtual Visit via Video Note   This visit type was conducted due to national recommendations for restrictions regarding the COVID-19 Pandemic (e.g. social distancing) in an effort to limit this patient's exposure and mitigate transmission in our community.  Due to his co-morbid illnesses, this patient is at least at moderate risk for complications without adequate follow up.  This format is felt to be most appropriate for this patient at this time.  All issues noted in this document were discussed and addressed.  A limited physical exam was performed with this format.  Please refer to the patient's chart for his consent to telehealth for [facility].   Date:  12/12/2018   ID:  Perry [name], DOB 1945-11-26, MRN [id number]  Patient Location: Home Provider Location: Office  PCP:  Darreld Mclean, MD  Cardiologist:  No primary care provider on file.  Electrophysiologist:  None   Evaluation Performed:  New Patient Evaluation  Chief Complaint:  CAD/PAF/Assuming care from Dr. Wynonia Lawman.  History of Present Illness:    Perry [name] is a 73 y.o. male with hyperlipidemia, PAF, coronary artery disease, status post coronary artery bypass graft 2004, thoracic aortic aneurysm, hypertension, diabetes II, obstructive sleep apnea, and dyslipidemia.  Mr. Shapley states that admit him in 2004 when I performed a coronary angiogram and Dr. Thurman Coyer absence and recommended coronary artery bypass grafting.  The surgery was completed by Dr. Servando Snare.  The patient has had no recurrence of angina since that time.  Prior to the coronary bypass grafting he had had multiple stents of the right coronary and LAD.  He has multiple cardiac comorbidities including morbid obesity, hyperlipidemia, type 2 diabetes, and essential hypertension.  He smoked cigarettes remotely.  He has obstructive sleep apnea.  He is compliant with medical therapy.  He is relatively sedentary.  He does walk for exercise.  Not meeting  the 130minutes/week of moderate activity recommended for secondary prevention.  He has known stable ascending aortic aneurysm.  The patient does not have symptoms concerning for COVID-19 infection (fever, chills, cough, or new shortness of breath).    Past Medical History:  Diagnosis Date  . A-fib ([place]) 04/2014  . Abscess of abdominal cavity ([place]) 05/04/2014  . Acute appendicitis with perforation and peritoneal abscess 05/04/2014  . Allergy   . CAD (coronary artery disease)   . CAD (coronary artery disease) of artery bypass graft 05/04/2014  . CHF (congestive heart failure) ([place])    pt not aware  . Diabetes mellitus    not on medication - pt not aware of this  . Dyslipidemia 05/04/2014  . Hearing loss    mild  . Hernia, inguinal    right  . Hyperglycemia   . Hypertension   . Ileus, postoperative ([place]) 05/04/2014  . Lumbar disc disease   . Old inferior wall myocardial infarction 2004   Past Surgical History:  Procedure Laterality Date  . COLONOSCOPY    . CORONARY ANGIOPLASTY     multiple  . CORONARY ARTERY BYPASS GRAFT  2004   LIMA to LAD and SVG to RCA (emergent)  . LAPAROSCOPIC APPENDECTOMY N/A 04/21/2014   Procedure: APPENDECTOMY LAPAROSCOPIC;  Surgeon: Rolm Bookbinder, MD;  Location: [facility];  Service: General;  Laterality: N/A;  . LAPAROSCOPIC APPENDECTOMY N/A 06/24/2014   Procedure: APPENDECTOMY LAPAROSCOPIC ;  Surgeon: Rolm Bookbinder, MD;  Location: [facility];  Service: General;  Laterality: N/A;  . LUMBAR LAMINECTOMY  2001   lumbar  . TONSILLECTOMY  Current Meds  Medication Sig  . aspirin 81 MG chewable tablet Chew 81 mg by mouth daily.  . fenofibrate (TRICOR) 145 MG tablet Take 1 tablet (145 mg total) by mouth daily.  . fish oil-omega-3 fatty acids 1000 MG capsule Take 2 g by mouth daily.    Marland Kitchen lisinopril (ZESTRIL) 40 MG tablet Take 40 mg by mouth daily.  . metFORMIN (GLUCOPHAGE-XR) 500 MG 24 hr tablet TAKE TWO TABLETS BY MOUTH EVERY MORNING  . metoprolol  succinate (TOPROL-XL) 50 MG 24 hr tablet Take 50 mg by mouth daily.  . nitroGLYCERIN (NITROSTAT) 0.4 MG SL tablet Place 0.4 mg under the tongue every 5 (five) minutes as needed for chest pain.  . rosuvastatin (CRESTOR) 5 MG tablet Take 1 tablet (5 mg total) by mouth daily.  Marland Kitchen spironolactone (ALDACTONE) 25 MG tablet Take 1 tablet (25 mg total) by mouth daily.     Allergies:   Xanax [alprazolam]   Social History   Tobacco Use  . Smoking status: Former Smoker    Types: Cigarettes    Last attempt to quit: 06/24/1979    Years since quitting: 39.4  . Smokeless tobacco: Former Systems developer    Types: Chew  Substance Use Topics  . Alcohol use: Yes    Alcohol/week: 14.0 standard drinks    Types: 14 Cans of beer per week    Comment: 2 drinks a day  . Drug use: No     Family Hx: The patient's family history includes Cancer in his sister; Heart disease in his brother, father, and paternal grandmother; Hypertension in his brother.  ROS:   Please see the history of present illness.    He has the burning mouth syndrome.  Has difficulty sleeping and is unable to tolerate CPAP for sleep apnea.  He sleeps with his mouth open.  He falls asleep a lot during the day.  He gets sleepy while driving. All other systems reviewed and are negative.   Prior CV studies:   The following studies were reviewed today:  Chest CT scan 2019: FINDINGS: Cardiovascular: Ascending thoracic aorta measures 40 mm (image 68/series 4/coronal series) compares with 42 mm on prior at a similar level  Post CABG anatomy.  No pericardial fluid.  Mediastinum/Nodes: No axillary or supraclavicular adenopathy. No mediastinal hilar adenopathy. No pericardial effusion. Esophagus  Lungs/Pleura: Ground-glass opacity in the RIGHT upper lobe measures 12 mm (image 48/8) and not changed from comparison exam no new pulmonary nodularity.  Upper Abdomen: Limited view of the liver, kidneys, pancreas are unremarkable. Normal adrenal  glands. Gallstones noted  Musculoskeletal: No aggressive osseous lesion  IMPRESSION: 1. Stable ascending aortic aneurysm. 2. Stable ground-glass density in the RIGHT upper lobe. Recommend follow-up CT in 12 months.  Nuclear myocardial perfusion study 2017:  Inferior infarction without evidence of ischemia.  Ejection fraction 48%  Labs/Other Tests and Data Reviewed:    EKG:  An ECG dated April 17, 2017 was personally reviewed today and demonstrated:  Inferior infarction, occasional PVC, poor R wave progression.  Recent Labs: No results found for requested labs within last 8760 hours.   Recent Lipid Panel Lab Results  Component Value Date/Time   CHOL 123 05/29/2018 10:45 AM   TRIG 130 05/29/2018 10:45 AM   HDL 41 05/29/2018 10:45 AM   CHOLHDL 3.0 05/29/2018 10:45 AM   CHOLHDL 3 10/08/2017 09:41 AM   LDLCALC 56 05/29/2018 10:45 AM    Wt Readings from Last 3 Encounters:  12/12/18 235 lb (106.6 kg)  05/29/18  238 lb 6.4 oz (108.1 kg)  10/08/17 235 lb (106.6 kg)     Objective:    Vital Signs:  BP 118/80   Ht 6\' 1"  (1.854 m)   Wt 235 lb (106.6 kg)   BMI 31.00 kg/m    VITAL SIGNS:  reviewed GEN:  no acute distress RESPIRATORY:  normal respiratory effort, symmetric expansion CARDIOVASCULAR:  no peripheral edema NEURO:  alert and oriented x 3, no obvious focal deficit  Morbid obesity  ASSESSMENT & PLAN:    1. Coronary artery disease involving native coronary artery of native heart without angina pectoris   2. Paroxysmal atrial fibrillation (HCC)   3. Mixed hyperlipidemia   4. Thoracic aortic aneurysm without rupture ([place])   5. Controlled type 2 diabetes mellitus with other circulatory complication, without long-term current use of insulin ([place])   6. Bradycardia   7. Morbid obesity ([place])   8. PVC's (premature ventricular contractions)   9. OSA (obstructive sleep apnea)   10. Burning mouth syndrome   11. Essential hypertension   12. 2019 novel coronavirus  disease (COVID-19)    PLAN:  1. He is doing well.  No angina since 2004 when he had two-vessel bypass surgery performed.  No angiogram since that time.  We spent considerable time discussing secondary prevention and pointing out metrics for each modifiable risk factor. 2. Atrial fibrillation occurred once in the setting of acute appendicitis hospitalization and none since that time. 3. LDL target is less than 70.  Most recent LDL was 56 in October 2019.  Triglyceride at that time was 130.  This may be a target for icosapent Meryl Crutch if it continues to rise. 4. Most recent CT scan done within the past 12 months revealed a stable aortic diameter at 4 cm which was slightly smaller than the previous year.  This will be repeated again later this year and likely scheduled when he returns for a face-to-face office visit in 3 to 4 months. 5. Hemoglobin A1c 7.2.  This was done within the past 6 months and is above target.  We discussed the goal of less than 7.  He should potentially be considered for SGLT2 therapy to provide glycemic control and provide CV risk reduction. 6. Asymptomatic and related to beta-blocker therapy 7. Could be modifiable with decreased caloric intake and moderate physical activity 8. Asymptomatic 9. Going back to see his sleep physician, Dr. Asencion Partridge Dohmeier.  Also asked that he mention the "burning mouth syndrome" to her to determine if there is a neurological basis. 10. See above 11. Target blood pressure 130/80 mmHg.  Overall education and awareness concerning primary/secondary risk prevention was discussed in detail: LDL less than 70, hemoglobin A1c less than 7, blood pressure target less than 130/80 mmHg, >150 minutes of moderate aerobic activity per week, avoidance of smoking, weight control (via diet and exercise), and continued surveillance/management of/for obstructive sleep apnea.   COVID-19 Education: The signs and symptoms of COVID-19 were discussed with the patient and  how to seek care for testing (follow up with PCP or arrange E-visit).  The importance of social distancing was discussed today.  Time:   Today, I have spent 20 minutes with the patient with telehealth technology discussing the above problems.     Medication Adjustments/Labs and Tests Ordered: Current medicines are reviewed at length with the patient today.  Concerns regarding medicines are outlined above.   Tests Ordered: No orders of the defined types were placed in this encounter.   Medication Changes:  No orders of the defined types were placed in this encounter.   Disposition:  Follow up in 3 month(s)  Signed, Sinclair Grooms, MD  12/12/2018 3:39 PM    [place]

## 2018-12-12 ENCOUNTER — Encounter: Payer: Self-pay | Admitting: Interventional Cardiology

## 2018-12-12 ENCOUNTER — Telehealth (INDEPENDENT_AMBULATORY_CARE_PROVIDER_SITE_OTHER): Payer: Medicare Other | Admitting: Interventional Cardiology

## 2018-12-12 ENCOUNTER — Other Ambulatory Visit: Payer: Self-pay

## 2018-12-12 VITALS — BP 118/80 | Ht 73.0 in | Wt 235.0 lb

## 2018-12-12 DIAGNOSIS — I493 Ventricular premature depolarization: Secondary | ICD-10-CM

## 2018-12-12 DIAGNOSIS — E119 Type 2 diabetes mellitus without complications: Secondary | ICD-10-CM | POA: Diagnosis not present

## 2018-12-12 DIAGNOSIS — I480 Paroxysmal atrial fibrillation: Secondary | ICD-10-CM

## 2018-12-12 DIAGNOSIS — I10 Essential (primary) hypertension: Secondary | ICD-10-CM

## 2018-12-12 DIAGNOSIS — I4891 Unspecified atrial fibrillation: Secondary | ICD-10-CM | POA: Diagnosis not present

## 2018-12-12 DIAGNOSIS — I712 Thoracic aortic aneurysm, without rupture, unspecified: Secondary | ICD-10-CM

## 2018-12-12 DIAGNOSIS — K146 Glossodynia: Secondary | ICD-10-CM

## 2018-12-12 DIAGNOSIS — R001 Bradycardia, unspecified: Secondary | ICD-10-CM

## 2018-12-12 DIAGNOSIS — E1159 Type 2 diabetes mellitus with other circulatory complications: Secondary | ICD-10-CM

## 2018-12-12 DIAGNOSIS — I2510 Atherosclerotic heart disease of native coronary artery without angina pectoris: Secondary | ICD-10-CM | POA: Diagnosis not present

## 2018-12-12 DIAGNOSIS — E782 Mixed hyperlipidemia: Secondary | ICD-10-CM | POA: Diagnosis not present

## 2018-12-12 DIAGNOSIS — G4733 Obstructive sleep apnea (adult) (pediatric): Secondary | ICD-10-CM

## 2018-12-12 DIAGNOSIS — U071 COVID-19: Secondary | ICD-10-CM

## 2018-12-12 NOTE — Patient Instructions (Signed)
Medication Instructions:  Your physician recommends that you continue on your current medications as directed. Please refer to the Current Medication list given to you today.  If you need a refill on your cardiac medications before your next appointment, please call your pharmacy.   Lab work: None If you have labs (blood work) drawn today and your tests are completely normal, you will receive your results only by: Marland Kitchen MyChart Message (if you have MyChart) OR . A paper copy in the mail If you have any lab test that is abnormal or we need to change your treatment, we will call you to review the results.  Testing/Procedures: None  Follow-Up: At [facility], you and your health needs are our priority.  As part of our continuing mission to provide you with exceptional heart care, we have created designated Provider Care Teams.  These Care Teams include your primary Cardiologist (physician) and Advanced Practice Providers (APPs -  Physician Assistants and Nurse Practitioners) who all work together to provide you with the care you need, when you need it. You will need a follow up appointment in 3-4 months.  Please call our office 2 months in advance to schedule this appointment.  You may see Dr. Tamala Julian or one of the following Advanced Practice Providers on your designated Care Team:   Truitt Merle, NP Cecilie Kicks, NP . Kathyrn Drown, NP  Any Other Special Instructions Will Be Listed Below (If Applicable).

## 2019-01-07 ENCOUNTER — Other Ambulatory Visit: Payer: Self-pay | Admitting: Family Medicine

## 2019-01-27 ENCOUNTER — Other Ambulatory Visit: Payer: Self-pay | Admitting: Family Medicine

## 2019-02-06 ENCOUNTER — Other Ambulatory Visit: Payer: Self-pay | Admitting: Cardiology

## 2019-02-06 DIAGNOSIS — Z20828 Contact with and (suspected) exposure to other viral communicable diseases: Secondary | ICD-10-CM | POA: Diagnosis not present

## 2019-02-06 NOTE — Telephone Encounter (Signed)
°*  STAT* If patient is at the pharmacy, call can be transferred to refill team.   1. Which medications need to be refilled? (please list name of each medication and dose if known) spironolactone (ALDACTONE) 25 MG tablet   2. Which pharmacy/location (including street and city if local pharmacy) is medication to be sent to?  [place], [place] [facility] [place] [phone] (Phone) [phone] (Fax)     3. Do they need a 30 day or 90 day supply? 90 day

## 2019-02-10 MED ORDER — SPIRONOLACTONE 25 MG PO TABS: 25.0000 mg | ORAL_TABLET | Freq: Every day | ORAL | 0 refills | Status: DC

## 2019-02-10 NOTE — Telephone Encounter (Signed)
°*  STAT*  2nd request  1. Which medications need to be refilled? (please list name of each medication and dose if known) spironolactone (ALDACTONE) 25 MG    2. Which pharmacy/location (including street and city if local pharmacy) is medication to be sent to?  [facility] [place], [place] - [place] [phone] (Phone) [phone] (Fax)    3. Do they need a 30 day or 90 day supply? [place]

## 2019-02-10 NOTE — Telephone Encounter (Signed)
Spironolactone refill sent to [place].

## 2019-02-13 ENCOUNTER — Other Ambulatory Visit: Payer: Self-pay | Admitting: Family Medicine

## 2019-02-17 ENCOUNTER — Other Ambulatory Visit: Payer: Self-pay
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
## 2019-02-26 ENCOUNTER — Other Ambulatory Visit: Payer: Self-pay

## 2019-02-26 ENCOUNTER — Ambulatory Visit (INDEPENDENT_AMBULATORY_CARE_PROVIDER_SITE_OTHER): Payer: Medicare Other | Admitting: Family Medicine

## 2019-02-26 ENCOUNTER — Encounter: Payer: Self-pay | Admitting: Family Medicine

## 2019-02-26 VITALS — BP 122/82 | HR 66 | Temp 97.7°F | Resp 17 | Ht 73.0 in | Wt 237.0 lb

## 2019-02-26 DIAGNOSIS — I10 Essential (primary) hypertension: Secondary | ICD-10-CM

## 2019-02-26 DIAGNOSIS — Z23 Encounter for immunization: Secondary | ICD-10-CM | POA: Diagnosis not present

## 2019-02-26 DIAGNOSIS — D649 Anemia, unspecified: Secondary | ICD-10-CM | POA: Diagnosis not present

## 2019-02-26 DIAGNOSIS — Z862 Personal history of diseases of the blood and blood-forming organs and certain disorders involving the immune mechanism: Secondary | ICD-10-CM | POA: Diagnosis not present

## 2019-02-26 DIAGNOSIS — I25700 Atherosclerosis of coronary artery bypass graft(s), unspecified, with unstable angina pectoris: Secondary | ICD-10-CM

## 2019-02-26 DIAGNOSIS — E782 Mixed hyperlipidemia: Secondary | ICD-10-CM | POA: Diagnosis not present

## 2019-02-26 DIAGNOSIS — E119 Type 2 diabetes mellitus without complications: Secondary | ICD-10-CM

## 2019-02-26 DIAGNOSIS — N400 Enlarged prostate without lower urinary tract symptoms: Secondary | ICD-10-CM | POA: Diagnosis not present

## 2019-02-26 DIAGNOSIS — R252 Cramp and spasm: Secondary | ICD-10-CM

## 2019-02-26 DIAGNOSIS — S61411A Laceration without foreign body of right hand, initial encounter: Secondary | ICD-10-CM | POA: Diagnosis not present

## 2019-02-26 DIAGNOSIS — L989 Disorder of the skin and subcutaneous tissue, unspecified: Secondary | ICD-10-CM

## 2019-02-26 LAB — LIPID PANEL
Cholesterol: 116 mg/dL (ref 0–200)
HDL: 37.2 mg/dL — ABNORMAL LOW (ref >39.00)
LDL Cholesterol: 52 mg/dL (ref 0–99)
NonHDL: 78.68
Total CHOL/HDL Ratio: 3
Triglycerides: 133 mg/dL (ref 0.0–149.0)
VLDL: 26.6 mg/dL (ref 0.0–40.0)

## 2019-02-26 LAB — CBC
HCT: 37.9 % — ABNORMAL LOW (ref 39.0–52.0)
Hemoglobin: 12.8 g/dL — ABNORMAL LOW (ref 13.0–17.0)
MCHC: 33.8 g/dL (ref 30.0–36.0)
MCV: 95.5 fl (ref 78.0–100.0)
Platelets: 239 10*3/uL (ref 150.0–400.0)
RBC: 3.97 Mil/uL — ABNORMAL LOW (ref 4.22–5.81)
RDW: 12.9 % (ref 11.5–15.5)
WBC: 6.5 10*3/uL (ref 4.0–10.5)

## 2019-02-26 LAB — COMPREHENSIVE METABOLIC PANEL
ALT: 33 U/L (ref 0–53)
AST: 34 U/L (ref 0–37)
Albumin: 4.3 g/dL (ref 3.5–5.2)
Alkaline Phosphatase: 37 U/L — ABNORMAL LOW (ref 39–117)
BUN: 25 mg/dL — ABNORMAL HIGH (ref 6–23)
CO2: 26 mEq/L (ref 19–32)
Calcium: 9.7 mg/dL (ref 8.4–10.5)
Chloride: 105 mEq/L (ref 96–112)
Creatinine, Ser: 1.1 mg/dL (ref 0.40–1.50)
GFR: 65.54 mL/min (ref >60.00)
Glucose, Bld: 119 mg/dL — ABNORMAL HIGH (ref 70–99)
Potassium: 4.4 mEq/L (ref 3.5–5.1)
Sodium: 137 mEq/L (ref 135–145)
Total Bilirubin: 0.5 mg/dL (ref 0.2–1.2)
Total Protein: 6.7 g/dL (ref 6.0–8.3)

## 2019-02-26 LAB — FERRITIN: Ferritin: 473.9 ng/mL — ABNORMAL HIGH (ref 22.0–322.0)

## 2019-02-26 LAB — HEMOGLOBIN A1C: Hgb A1c MFr Bld: 7.4 % — ABNORMAL HIGH (ref 4.6–6.5)

## 2019-02-26 LAB — PSA: PSA: 3.26 ng/mL (ref 0.10–4.00)

## 2019-02-26 MED ORDER — METOPROLOL SUCCINATE ER 50 MG PO TB24: 50.0000 mg | ORAL_TABLET | Freq: Every day | ORAL | 3 refills | Status: DC

## 2019-02-26 MED ORDER — SPIRONOLACTONE 25 MG PO TABS: 25.0000 mg | ORAL_TABLET | Freq: Every day | ORAL | 3 refills | Status: DC

## 2019-02-26 MED ORDER — METFORMIN HCL ER 500 MG PO TB24: ORAL_TABLET | ORAL | 3 refills | Status: DC

## 2019-02-26 MED ORDER — ROSUVASTATIN CALCIUM 5 MG PO TABS: 5.0000 mg | ORAL_TABLET | Freq: Every day | ORAL | 3 refills | Status: DC

## 2019-02-26 MED ORDER — LISINOPRIL 40 MG PO TABS: 40.0000 mg | ORAL_TABLET | Freq: Every day | ORAL | 3 refills | Status: DC

## 2019-02-26 MED ORDER — FENOFIBRATE 145 MG PO TABS: 145.0000 mg | ORAL_TABLET | Freq: Every day | ORAL | 3 refills | Status: DC

## 2019-02-26 NOTE — Patient Instructions (Signed)
Great to see you today-  I will be in touch with your labs asap We can think about adding an SGLT2 class diabetes med which can also protect your heart You got your tetanus booster today- flu shot due this fall I think you are due for an eye exam - please do at your convenience

## 2019-02-27 ENCOUNTER — Encounter: Payer: Self-pay | Admitting: Family Medicine

## 2019-02-27 DIAGNOSIS — E119 Type 2 diabetes mellitus without complications: Secondary | ICD-10-CM

## 2019-02-27 NOTE — Addendum Note (Signed)
Addended by: Lamar Blinks C on: 02/27/2019 08:02 AM   Modules accepted: Orders

## 2019-03-04 ENCOUNTER — Telehealth: Payer: Self-pay

## 2019-03-04 MED ORDER — DAPAGLIFLOZIN PROPANEDIOL 5 MG PO TABS: 5.0000 mg | ORAL_TABLET | Freq: Every day | ORAL | 6 refills | Status: DC

## 2019-03-04 NOTE — Telephone Encounter (Signed)
PA initiated via Covermymeds; KEY: [id number]. Awaiting determination.

## 2019-03-04 NOTE — Telephone Encounter (Signed)
PA denied. Awaiting denial information.  °

## 2019-03-05 MED ORDER — CANAGLIFLOZIN 100 MG PO TABS: 100.0000 mg | ORAL_TABLET | Freq: Every day | ORAL | 6 refills | Status: DC

## 2019-03-05 NOTE — Telephone Encounter (Signed)
Preferred alternative: Invokana and Jardiance.

## 2019-03-05 NOTE — Telephone Encounter (Signed)
Ok, change to invokana 100 mg daily

## 2019-03-12 ENCOUNTER — Telehealth: Payer: Self-pay | Admitting: Family Medicine

## 2019-03-12 NOTE — Telephone Encounter (Signed)
PA initiated via Covermymeds; KEY: [id number]. Awaiting determination.

## 2019-03-12 NOTE — Telephone Encounter (Signed)
PA cancelled. Per plan already covered by members insurance.

## 2019-03-12 NOTE — Telephone Encounter (Signed)
°  Medication Refill - Medication: canagliflozin (INVOKANA) 100 MG TABS tablet (Patient stated that PA has still not been received for medication and he would like a callback today with a status update.)     Has the patient contacted their pharmacy? Yes (Agent: If no, request that the patient contact the pharmacy for the refill.) (Agent: If yes, when and what did the pharmacy advise?)Contact PCP  Preferred Pharmacy (with phone number or street name):  [facility], [place] [phone] (Phone) [phone] (Fax     Agent: Please be advised that RX refills may take up to 3 business days. We ask that you follow-up with your pharmacy.

## 2019-03-13 ENCOUNTER — Other Ambulatory Visit: Payer: Medicare Other

## 2019-03-13 DIAGNOSIS — D649 Anemia, unspecified: Secondary | ICD-10-CM

## 2019-03-14 ENCOUNTER — Other Ambulatory Visit: Payer: Self-pay | Admitting: *Deleted

## 2019-03-14 ENCOUNTER — Other Ambulatory Visit (INDEPENDENT_AMBULATORY_CARE_PROVIDER_SITE_OTHER): Payer: Medicare Other

## 2019-03-14 DIAGNOSIS — D649 Anemia, unspecified: Secondary | ICD-10-CM | POA: Diagnosis not present

## 2019-03-14 LAB — FECAL OCCULT BLOOD, IMMUNOCHEMICAL: Fecal Occult Bld: NEGATIVE

## 2019-03-15 ENCOUNTER — Encounter: Payer: Self-pay | Admitting: Family Medicine

## 2019-04-02 ENCOUNTER — Encounter: Payer: Self-pay | Admitting: Family Medicine

## 2019-04-04 ENCOUNTER — Other Ambulatory Visit: Payer: Self-pay

## 2019-04-04 ENCOUNTER — Ambulatory Visit (INDEPENDENT_AMBULATORY_CARE_PROVIDER_SITE_OTHER): Payer: Medicare Other | Admitting: Interventional Cardiology

## 2019-04-04 ENCOUNTER — Encounter: Payer: Self-pay | Admitting: *Deleted

## 2019-04-04 ENCOUNTER — Encounter: Payer: Self-pay | Admitting: Interventional Cardiology

## 2019-04-04 VITALS — BP 106/68 | HR 55 | Ht 73.0 in | Wt 237.8 lb

## 2019-04-04 DIAGNOSIS — I712 Thoracic aortic aneurysm, without rupture, unspecified: Secondary | ICD-10-CM

## 2019-04-04 DIAGNOSIS — I25700 Atherosclerosis of coronary artery bypass graft(s), unspecified, with unstable angina pectoris: Secondary | ICD-10-CM

## 2019-04-04 DIAGNOSIS — I2510 Atherosclerotic heart disease of native coronary artery without angina pectoris: Secondary | ICD-10-CM

## 2019-04-04 DIAGNOSIS — G4733 Obstructive sleep apnea (adult) (pediatric): Secondary | ICD-10-CM

## 2019-04-04 DIAGNOSIS — I10 Essential (primary) hypertension: Secondary | ICD-10-CM

## 2019-04-04 DIAGNOSIS — I480 Paroxysmal atrial fibrillation: Secondary | ICD-10-CM

## 2019-04-04 DIAGNOSIS — E782 Mixed hyperlipidemia: Secondary | ICD-10-CM | POA: Diagnosis not present

## 2019-04-04 DIAGNOSIS — E1159 Type 2 diabetes mellitus with other circulatory complications: Secondary | ICD-10-CM

## 2019-04-04 DIAGNOSIS — Z7189 Other specified counseling: Secondary | ICD-10-CM | POA: Diagnosis not present

## 2019-04-04 NOTE — Patient Instructions (Addendum)
Medication Instructions:  Your physician recommends that you continue on your current medications as directed. Please refer to the Current Medication list given to you today.  If you need a refill on your cardiac medications before your next appointment, please call your pharmacy.   Lab work: None If you have labs (blood work) drawn today and your tests are completely normal, you will receive your results only by: Marland Kitchen MyChart Message (if you have MyChart) OR . A paper copy in the mail If you have any lab test that is abnormal or we need to change your treatment, we will call you to review the results.  Testing/Procedures: Your physician has requested that you have a lexiscan myoview. For further information please visit [email or web address]. Please follow instruction sheet, as given.  Your physician recommends that you have a Chest CT with Contrast in October.  Follow-Up: At [facility], you and your health needs are our priority.  As part of our continuing mission to provide you with exceptional heart care, we have created designated Provider Care Teams.  These Care Teams include your primary Cardiologist (physician) and Advanced Practice Providers (APPs -  Physician Assistants and Nurse Practitioners) who all work together to provide you with the care you need, when you need it. You will need a follow up appointment in 6 months.  Please call our office 2 months in advance to schedule this appointment.  You may see Sinclair Grooms, MD or one of the following Advanced Practice Providers on your designated Care Team:   Truitt Merle, NP Cecilie Kicks, NP . Kathyrn Drown, NP  Any Other Special Instructions Will Be Listed Below (If Applicable).

## 2019-04-04 NOTE — Progress Notes (Signed)
Cardiology Office Note:    Date:  04/04/2019   ID:  [name], DOB 1945/09/27, MRN [id number]  PCP:  Perry Mclean, MD  Cardiologist:  Sinclair Grooms, MD   Referring MD: Perry Mclean, MD   Chief Complaint  Patient presents with  . Coronary Artery Disease    History of Present Illness:    Perry [name] is a 73 y.o. male with a hx of asthma but athyperlipidemia, PAF, coronary artery disease, status post coronary artery bypass graft 2004, thoracic aortic aneurysm, hypertension, diabetes II, obstructive sleep apnea, and dyslipidemia.  This is a transitioning patient from Dr. Tollie Eth.  We met earlier this year and a virtual visit.  I felt we needed to see each other and person.  He is feeling well.  He is 16 years out from bypass surgery.  He has had remote stress tests.  None within the past 5 years.  No symptoms suggest angina.  He does have a stable ascending aortic aneurysm measuring 4.1 cm.  This will be followed by [place] in a close matter.  Invokana was started by Dr. Lorelei Pont.  I am really happy about this as this will further decrease CV risk.  His A1c when last checked was 7.2.  Hopefully this will bring him less than 7.  His blood pressure has been lower since the medication was started.  He has not needed nitroglycerin.  No medication side effects.  Most recent LDL was 52.  Past Medical History:  Diagnosis Date  . A-fib ([place]) 04/2014  . Abscess of abdominal cavity ([place]) 05/04/2014  . Acute appendicitis with perforation and peritoneal abscess 05/04/2014  . Allergy   . CAD (coronary artery disease)   . CAD (coronary artery disease) of artery bypass graft 05/04/2014  . CHF (congestive heart failure) ([place])    pt not aware  . Diabetes mellitus    not on medication - pt not aware of this  . Dyslipidemia 05/04/2014  . Hearing loss    mild  . Hernia, inguinal    right  . Hyperglycemia   . Hypertension   . Ileus, postoperative ([place]) 05/04/2014  . Lumbar  disc disease   . Old inferior wall myocardial infarction 2004    Past Surgical History:  Procedure Laterality Date  . COLONOSCOPY    . CORONARY ANGIOPLASTY     multiple  . CORONARY ARTERY BYPASS GRAFT  2004   LIMA to LAD and SVG to RCA (emergent)  . LAPAROSCOPIC APPENDECTOMY N/A 04/21/2014   Procedure: APPENDECTOMY LAPAROSCOPIC;  Surgeon: Rolm Bookbinder, MD;  Location: [facility];  Service: General;  Laterality: N/A;  . LAPAROSCOPIC APPENDECTOMY N/A 06/24/2014   Procedure: APPENDECTOMY LAPAROSCOPIC ;  Surgeon: Rolm Bookbinder, MD;  Location: [facility];  Service: General;  Laterality: N/A;  . LUMBAR LAMINECTOMY  2001   lumbar  . TONSILLECTOMY      Current Medications: Current Meds  Medication Sig  . aspirin 81 MG chewable tablet Chew 81 mg by mouth daily.  . canagliflozin (INVOKANA) 100 MG TABS tablet Take 1 tablet (100 mg total) by mouth daily before breakfast.  . fenofibrate (TRICOR) 145 MG tablet Take 1 tablet (145 mg total) by mouth daily.  . fish oil-omega-3 fatty acids 1000 MG capsule Take 2 g by mouth daily.    Marland Kitchen lisinopril (ZESTRIL) 40 MG tablet Take 1 tablet (40 mg total) by mouth daily.  . metFORMIN (GLUCOPHAGE-XR) 500 MG 24 hr tablet TAKE TWO TABLETS BY  MOUTH EVERY MORNING  . metoprolol succinate (TOPROL-XL) 50 MG 24 hr tablet Take 1 tablet (50 mg total) by mouth daily.  . nitroGLYCERIN (NITROSTAT) 0.4 MG SL tablet Place 0.4 mg under the tongue every 5 (five) minutes as needed for chest pain.  . rosuvastatin (CRESTOR) 5 MG tablet Take 1 tablet (5 mg total) by mouth daily.  Marland Kitchen spironolactone (ALDACTONE) 25 MG tablet Take 1 tablet (25 mg total) by mouth daily.     Allergies:   Patient has no active allergies.   Social History   Socioeconomic History  . Marital status: Married    Spouse name: [name]  . Number of children: 2  . Years of education: Masters  . Highest education level: Not on file  Occupational History  . Occupation: Clinical biochemist  Social Needs  .  Financial resource strain: Not on file  . Food insecurity    Worry: Not on file    Inability: Not on file  . Transportation needs    Medical: Not on file    Non-medical: Not on file  Tobacco Use  . Smoking status: Former Smoker    Types: Cigarettes    Quit date: 06/24/1979    Years since quitting: 39.8  . Smokeless tobacco: Former Systems developer    Types: Chew  Substance and Sexual Activity  . Alcohol use: Yes    Alcohol/week: 14.0 standard drinks    Types: 14 Cans of beer per week    Comment: 2 drinks a day  . Drug use: No  . Sexual activity: Yes  Lifestyle  . Physical activity    Days per week: Not on file    Minutes per session: Not on file  . Stress: Not on file  Relationships  . Social Herbalist on phone: Not on file    Gets together: Not on file    Attends religious service: Not on file    Active member of club or organization: Not on file    Attends meetings of clubs or organizations: Not on file    Relationship status: Not on file  Other Topics Concern  . Not on file  Social History Narrative   Patient is married [name]) and lives at home with his wife.   Patient has two adult children.   Patient is working full-time.   Patient has a Master's degree.   Patient drinks one cup of coffee every morning.   Patient is right-handed.     Family History: The patient's family history includes Cancer in his sister; Heart disease in his brother, father, and paternal grandmother; Hypertension in his brother.  ROS:   Please see the history of present illness.    No real complaints.  Some anxiety concerning his aneurysm.  He has trouble sleeping.  He has sleep apnea but has never had therapy despite being recommended CPAP by Dr. Brett Fairy.  He tried it but it was cumbersome.  All other systems reviewed and are negative.  EKGs/Labs/Other Studies Reviewed:    The following studies were reviewed today: No recent cardiac evaluation other than CT scan last year IMPRESSION:  1. Stable ascending aortic aneurysm. 2. Stable ground-glass density in the RIGHT upper lobe. Recommend follow-up CT in 12 months.  EKG:  EKG sinus bradycardia, left axis deviation, pattern consistent with old inferior infarct.  First-degree AV block.  Recent Labs: 02/26/2019: ALT 33; BUN 25; Creatinine, Ser 1.10; Hemoglobin 12.8; Platelets 239.0; Potassium 4.4; Sodium 137  Recent Lipid Panel  Component Value Date/Time   CHOL 116 02/26/2019 1016   CHOL 123 05/29/2018 1045   TRIG 133.0 02/26/2019 1016   HDL 37.20 (L) 02/26/2019 1016   HDL 41 05/29/2018 1045   CHOLHDL 3 02/26/2019 1016   VLDL 26.6 02/26/2019 1016   LDLCALC 52 02/26/2019 1016   LDLCALC 56 05/29/2018 1045    Physical Exam:    VS:  BP 106/68   Pulse (!) 55   Ht _0  (1.854 m)   Wt 237 lb 12.8 oz (107.9 kg)   SpO2 97%   BMI 31.37 kg/m     Wt Readings from Last 3 Encounters:  04/04/19 237 lb 12.8 oz (107.9 kg)  02/26/19 237 lb (107.5 kg)  12/12/18 235 lb (106.6 kg)     GEN: Moderate obesity. No acute distress HEENT: Normal NECK: No JVD. LYMPHATICS: No lymphadenopathy CARDIAC:  RRR without murmur, gallop, or edema. VASCULAR:  Normal Pulses. No bruits. RESPIRATORY:  Clear to auscultation without rales, wheezing or rhonchi  ABDOMEN: Soft, non-tender, non-distended, No pulsatile mass, MUSCULOSKELETAL: No deformity  SKIN: Warm and dry NEUROLOGIC:  Alert and oriented x 3 PSYCHIATRIC:  Normal affect   ASSESSMENT:    1. Coronary artery disease involving native coronary artery of native heart without angina pectoris   2. Paroxysmal atrial fibrillation (HCC)   3. Mixed hyperlipidemia   4. Thoracic aortic aneurysm without rupture ([place])   5. Essential hypertension   6. OSA (obstructive sleep apnea)   7. Controlled type 2 diabetes mellitus with other circulatory complication, without long-term current use of insulin ([place])   8. Educated About Covid-19 Virus Infection    PLAN:    In order of problems  listed above:  1. Secondary prevention discussed.  See below low discussion.  We will do a pharmacologic myocardial perfusion study.  The patient is 15 years out from coronary bypass grafting.  This will be a surveillance study to exclude an expected bypass graft occlusion. 2. Has only had one episode of atrial fibrillation associated with abdominal surgery.  No recurrence. 3. LDL target less than 70 4. CT chest with contrast in October to follow-up 5. Excellent blood pressure control 6. He will go back to Dr. Brett Fairy thank to determine if newer therapy is possible that will allow him to tolerate. 7. Hopefully A1c will be much better now that he has started Invokana. 8. We discussed social distancing, mask wearing, and handwashing.  Overall education and awareness concerning primary/secondary risk prevention was discussed in detail: LDL less than 70, hemoglobin A1c less than 7, blood pressure target less than 130/80 mmHg, >150 minutes of moderate aerobic activity per week, avoidance of smoking, weight control (via diet and exercise), and continued surveillance/management of/for obstructive sleep apnea.    Medication Adjustments/Labs and Tests Ordered: Current medicines are reviewed at length with the patient today.  Concerns regarding medicines are outlined above.  Orders Placed This Encounter  Procedures  . CT Chest W Contrast  . Basic metabolic panel  . MYOCARDIAL PERFUSION IMAGING  . EKG 12-Lead   No orders of the defined types were placed in this encounter.   Patient Instructions  Medication Instructions:  Your physician recommends that you continue on your current medications as directed. Please refer to the Current Medication list given to you today.  If you need a refill on your cardiac medications before your next appointment, please call your pharmacy.   Lab work: None If you have labs (blood work) drawn today and your tests  are completely normal, you will receive your  results only by: Marland Kitchen MyChart Message (if you have MyChart) OR . A paper copy in the mail If you have any lab test that is abnormal or we need to change your treatment, we will call you to review the results.  Testing/Procedures: Your physician has requested that you have a lexiscan myoview. For further information please visit [email or web address]. Please follow instruction sheet, as given.  Your physician recommends that you have a Chest CT with Contrast in October.  Follow-Up: At [facility], you and your health needs are our priority.  As part of our continuing mission to provide you with exceptional heart care, we have created designated Provider Care Teams.  These Care Teams include your primary Cardiologist (physician) and Advanced Practice Providers (APPs -  Physician Assistants and Nurse Practitioners) who all work together to provide you with the care you need, when you need it. You will need a follow up appointment in 6 months.  Please call our office 2 months in advance to schedule this appointment.  You may see Sinclair Grooms, MD or one of the following Advanced Practice Providers on your designated Care Team:   Truitt Merle, NP Cecilie Kicks, NP . Kathyrn Drown, NP  Any Other Special Instructions Will Be Listed Below (If Applicable).       Signed, Sinclair Grooms, MD  04/04/2019 3:51 PM    [facility]

## 2019-04-08 ENCOUNTER — Telehealth (HOSPITAL_COMMUNITY): Payer: Self-pay | Admitting: *Deleted

## 2019-04-08 NOTE — Telephone Encounter (Signed)
Patient given detailed instructions per Myocardial Perfusion Study Information Sheet for the test on 04/11/19 at 7:45. Patient notified to arrive 15 minutes early and that it is imperative to arrive on time for appointment to keep from having the test rescheduled.  If you need to cancel or reschedule your appointment, please call the office within 24 hours of your appointment. . Patient verbalized understanding. Veronia Beets

## 2019-04-10 ENCOUNTER — Other Ambulatory Visit: Payer: Self-pay | Admitting: Family Medicine

## 2019-04-10 DIAGNOSIS — I25700 Atherosclerosis of coronary artery bypass graft(s), unspecified, with unstable angina pectoris: Secondary | ICD-10-CM

## 2019-04-10 DIAGNOSIS — E782 Mixed hyperlipidemia: Secondary | ICD-10-CM

## 2019-04-10 DIAGNOSIS — I10 Essential (primary) hypertension: Secondary | ICD-10-CM

## 2019-04-10 DIAGNOSIS — E119 Type 2 diabetes mellitus without complications: Secondary | ICD-10-CM

## 2019-04-11 ENCOUNTER — Other Ambulatory Visit: Payer: Self-pay

## 2019-04-11 ENCOUNTER — Ambulatory Visit (HOSPITAL_COMMUNITY): Payer: Medicare Other | Attending: Cardiology

## 2019-04-11 DIAGNOSIS — I2510 Atherosclerotic heart disease of native coronary artery without angina pectoris: Secondary | ICD-10-CM | POA: Diagnosis not present

## 2019-04-11 LAB — MYOCARDIAL PERFUSION IMAGING
LV dias vol: 153 mL (ref 62–150)
LV sys vol: 80 mL
Peak HR: 88 {beats}/min
Rest HR: 53 {beats}/min
SDS: 3
SRS: 2
SSS: 5
TID: 1.18

## 2019-04-11 MED ORDER — TECHNETIUM TC 99M TETROFOSMIN IV KIT
10.8000 | PACK | Freq: Once | INTRAVENOUS | Status: AC | PRN
  Administered 2019-04-11: 10.8 via INTRAVENOUS
  Filled 2019-04-11: qty 11

## 2019-04-11 MED ORDER — TECHNETIUM TC 99M TETROFOSMIN IV KIT
31.8000 | PACK | Freq: Once | INTRAVENOUS | Status: AC | PRN
  Administered 2019-04-11: 31.8 via INTRAVENOUS
  Filled 2019-04-11: qty 32

## 2019-04-11 MED ORDER — REGADENOSON 0.4 MG/5ML IV SOLN
0.4000 mg | Freq: Once | INTRAVENOUS | Status: AC
  Administered 2019-04-11: 0.4 mg via INTRAVENOUS

## 2019-04-16 DIAGNOSIS — N400 Enlarged prostate without lower urinary tract symptoms: Secondary | ICD-10-CM | POA: Diagnosis not present

## 2019-04-16 DIAGNOSIS — N5201 Erectile dysfunction due to arterial insufficiency: Secondary | ICD-10-CM | POA: Diagnosis not present

## 2019-04-24 DIAGNOSIS — Z23 Encounter for immunization: Secondary | ICD-10-CM | POA: Diagnosis not present

## 2019-05-12 ENCOUNTER — Other Ambulatory Visit: Payer: Medicare Other

## 2019-05-13 ENCOUNTER — Other Ambulatory Visit: Payer: Self-pay

## 2019-05-13 ENCOUNTER — Other Ambulatory Visit: Payer: Medicare Other

## 2019-05-13 DIAGNOSIS — I712 Thoracic aortic aneurysm, without rupture, unspecified: Secondary | ICD-10-CM

## 2019-05-13 DIAGNOSIS — I10 Essential (primary) hypertension: Secondary | ICD-10-CM

## 2019-05-13 DIAGNOSIS — L570 Actinic keratosis: Secondary | ICD-10-CM | POA: Diagnosis not present

## 2019-05-13 DIAGNOSIS — L821 Other seborrheic keratosis: Secondary | ICD-10-CM | POA: Diagnosis not present

## 2019-05-13 DIAGNOSIS — L433 Subacute (active) lichen planus: Secondary | ICD-10-CM | POA: Diagnosis not present

## 2019-05-13 DIAGNOSIS — D1801 Hemangioma of skin and subcutaneous tissue: Secondary | ICD-10-CM | POA: Diagnosis not present

## 2019-05-13 DIAGNOSIS — D3617 Benign neoplasm of peripheral nerves and autonomic nervous system of trunk, unspecified: Secondary | ICD-10-CM | POA: Diagnosis not present

## 2019-05-13 LAB — BASIC METABOLIC PANEL
BUN/Creatinine Ratio: 26 — ABNORMAL HIGH (ref 10–24)
BUN: 33 mg/dL — ABNORMAL HIGH (ref 8–27)
CO2: 21 mmol/L (ref 20–29)
Calcium: 10.3 mg/dL — ABNORMAL HIGH (ref 8.6–10.2)
Chloride: 103 mmol/L (ref 96–106)
Creatinine, Ser: 1.25 mg/dL (ref 0.76–1.27)
GFR calc Af Amer: 66 mL/min/{1.73_m2} (ref >59)
GFR calc non Af Amer: 57 mL/min/{1.73_m2} — ABNORMAL LOW (ref >59)
Glucose: 130 mg/dL — ABNORMAL HIGH (ref 65–99)
Potassium: 4.6 mmol/L (ref 3.5–5.2)
Sodium: 137 mmol/L (ref 134–144)

## 2019-05-19 ENCOUNTER — Ambulatory Visit (INDEPENDENT_AMBULATORY_CARE_PROVIDER_SITE_OTHER)
Admission: RE | Admit: 2019-05-19 | Discharge: 2019-05-19 | Disposition: A | Discharge status: Home / Self Care | Payer: Medicare Other | Source: Ambulatory Visit | Attending: Interventional Cardiology | Admitting: Interventional Cardiology

## 2019-05-19 ENCOUNTER — Other Ambulatory Visit: Payer: Self-pay

## 2019-05-19 DIAGNOSIS — I712 Thoracic aortic aneurysm, without rupture, unspecified: Secondary | ICD-10-CM

## 2019-05-19 MED ORDER — IOHEXOL 350 MG/ML SOLN
100.0000 mL | Freq: Once | INTRAVENOUS | Status: AC | PRN
  Administered 2019-05-19: 100 mL via INTRAVENOUS

## 2019-06-06 ENCOUNTER — Other Ambulatory Visit: Payer: Self-pay

## 2019-06-06 DIAGNOSIS — Z20828 Contact with and (suspected) exposure to other viral communicable diseases: Secondary | ICD-10-CM | POA: Diagnosis not present

## 2019-06-06 DIAGNOSIS — Z20822 Contact with and (suspected) exposure to covid-19: Secondary | ICD-10-CM

## 2019-06-07 LAB — NOVEL CORONAVIRUS, NAA: SARS-CoV-2, NAA: NOT DETECTED

## 2019-06-20 DIAGNOSIS — L433 Subacute (active) lichen planus: Secondary | ICD-10-CM | POA: Diagnosis not present

## 2019-08-29 ENCOUNTER — Ambulatory Visit: Payer: Medicare Other | Attending: Internal Medicine

## 2019-08-29 DIAGNOSIS — Z23 Encounter for immunization: Secondary | ICD-10-CM

## 2019-08-29 NOTE — Progress Notes (Signed)
   Covid-19 Vaccination Clinic  Name:  Perry [name]    MRN: [id number] DOB: 1945/10/12  08/29/2019  [name] was observed post Covid-19 immunization for 15 minutes without incidence. He was provided with Vaccine Information Sheet and instruction to access the V-Safe system.   [name] was instructed to call 911 with any severe reactions post vaccine: Marland Kitchen Difficulty breathing  . Swelling of your face and throat  . A fast heartbeat  . A bad rash all over your body  . Dizziness and weakness    Immunizations Administered    Name Date Dose VIS Date Route   Pfizer COVID-19 Vaccine 08/29/2019  4:22 PM 0.3 mL 07/18/2019 Intramuscular   Manufacturer: [place]   Lot: [id number]   [place]: [id number]

## 2019-09-19 ENCOUNTER — Ambulatory Visit: Payer: Medicare Other | Attending: Internal Medicine

## 2019-09-19 DIAGNOSIS — Z23 Encounter for immunization: Secondary | ICD-10-CM | POA: Insufficient documentation

## 2019-09-19 NOTE — Progress Notes (Signed)
   Covid-19 Vaccination Clinic  Name:  Perry [name]    MRN: [id number] DOB: Oct 18, 1945  09/19/2019  Mr. Study was observed post Covid-19 immunization for 15 minutes without incidence. He was provided with Vaccine Information Sheet and instruction to access the V-Safe system.   [name] was instructed to call 911 with any severe reactions post vaccine: Marland Kitchen Difficulty breathing  . Swelling of your face and throat  . A fast heartbeat  . A bad rash all over your body  . Dizziness and weakness    Immunizations Administered    Name Date Dose VIS Date Route   Pfizer COVID-19 Vaccine 09/19/2019  3:54 PM 0.3 mL 07/18/2019 Intramuscular   Manufacturer: [place]   Lot: [id number]   [place]: [id number]

## 2019-09-28 ENCOUNTER — Ambulatory Visit: Payer: Medicare Other

## 2019-09-28 ENCOUNTER — Other Ambulatory Visit: Payer: Self-pay | Admitting: Family Medicine

## 2019-09-30 NOTE — Progress Notes (Signed)
Cardiology Office Note:    Date:  10/01/2019   ID:  Perry [name], DOB November 16, 1945, MRN [id number]  PCP:  Perry Mclean, MD  Cardiologist:  Perry Grooms, MD   Referring MD: Perry Mclean, MD   Chief Complaint  Patient presents with  . Coronary Artery Disease    History of Present Illness:    Perry [name] is a 74 y.o. male with a hx of asthma, hyperlipidemia, PAF,coronary artery disease, status post coronary artery bypass graft2004,thoracic aortic aneurysm, hypertension, diabetes II,obstructive sleep apnea,anddyslipidemia.  He has no complaints.  Physical activity is limited by bilateral hip arthritis.  He denies angina, orthopnea, PND, dyspnea, orthopnea, claudication, and syncope.  No medication side effects.  Past Medical History:  Diagnosis Date  . A-fib ([place]) 04/2014  . Abscess of abdominal cavity ([place]) 05/04/2014  . Acute appendicitis with perforation and peritoneal abscess 05/04/2014  . Allergy   . CAD (coronary artery disease)   . CAD (coronary artery disease) of artery bypass graft 05/04/2014  . CHF (congestive heart failure) ([place])    pt not aware  . Diabetes mellitus    not on medication - pt not aware of this  . Dyslipidemia 05/04/2014  . Hearing loss    mild  . Hernia, inguinal    right  . Hyperglycemia   . Hypertension   . Ileus, postoperative ([place]) 05/04/2014  . Lumbar disc disease   . Old inferior wall myocardial infarction 2004    Past Surgical History:  Procedure Laterality Date  . COLONOSCOPY    . CORONARY ANGIOPLASTY     multiple  . CORONARY ARTERY BYPASS GRAFT  2004   LIMA to LAD and SVG to RCA (emergent)  . LAPAROSCOPIC APPENDECTOMY N/A 04/21/2014   Procedure: APPENDECTOMY LAPAROSCOPIC;  Surgeon: Rolm Bookbinder, MD;  Location: [facility];  Service: General;  Laterality: N/A;  . LAPAROSCOPIC APPENDECTOMY N/A 06/24/2014   Procedure: APPENDECTOMY LAPAROSCOPIC ;  Surgeon: Rolm Bookbinder, MD;  Location: [facility];  Service:  General;  Laterality: N/A;  . LUMBAR LAMINECTOMY  2001   lumbar  . TONSILLECTOMY      Current Medications: Current Meds  Medication Sig  . aspirin 81 MG chewable tablet Chew 81 mg by mouth daily.  . fenofibrate (TRICOR) 145 MG tablet TAKE 1 TABLET BY MOUTH DAILY  . fish oil-omega-3 fatty acids 1000 MG capsule Take 2 g by mouth daily.    . INVOKANA 100 MG TABS tablet TAKE 1 TABLET BY MOUTH EVERY DAY BEFORE BREAKFAST  . lisinopril (ZESTRIL) 40 MG tablet TAKE 1 TABLET BY MOUTH DAILY  . metFORMIN (GLUCOPHAGE-XR) 500 MG 24 hr tablet TAKE TWO TABLETS BY MOUTH EVERY MORNING  . metoprolol succinate (TOPROL-XL) 50 MG 24 hr tablet Take 1 tablet (50 mg total) by mouth daily.  . nitroGLYCERIN (NITROSTAT) 0.4 MG SL tablet Place 0.4 mg under the tongue every 5 (five) minutes as needed for chest pain.  . rosuvastatin (CRESTOR) 5 MG tablet TAKE 1 TABLET BY MOUTH DAILY  . spironolactone (ALDACTONE) 25 MG tablet TAKE 1 TABLET BY MOUTH DAILY     Allergies:   Patient has no active allergies.   Social History   Socioeconomic History  . Marital status: Married    Spouse name: Perry [name]  . Number of children: 2  . Years of education: Masters  . Highest education level: Not on file  Occupational History  . Occupation: Clinical biochemist  Tobacco Use  . Smoking status: Former Smoker  Types: Cigarettes    Quit date: 06/24/1979    Years since quitting: 40.2  . Smokeless tobacco: Former Systems developer    Types: Chew  Substance and Sexual Activity  . Alcohol use: Yes    Alcohol/week: 14.0 standard drinks    Types: 14 Cans of beer per week    Comment: 2 drinks a day  . Drug use: No  . Sexual activity: Yes  Other Topics Concern  . Not on file  Social History Narrative   Patient is married Perry [name]) and lives at home with his wife.   Patient has two adult children.   Patient is working full-time.   Patient has a Master's degree.   Patient drinks one cup of coffee every morning.   Patient is right-handed.    Social Determinants of Health   Financial Resource Strain:   . Difficulty of Paying Living Expenses: Not on file  Food Insecurity:   . Worried About Charity fundraiser in the Last Year: Not on file  . Ran Out of Food in the Last Year: Not on file  Transportation Needs:   . Lack of Transportation (Medical): Not on file  . Lack of Transportation (Non-Medical): Not on file  Physical Activity:   . Days of Exercise per Week: Not on file  . Minutes of Exercise per Session: Not on file  Stress:   . Feeling of Stress : Not on file  Social Connections:   . Frequency of Communication with Friends and Family: Not on file  . Frequency of Social Gatherings with Friends and Family: Not on file  . Attends Religious Services: Not on file  . Active Member of Clubs or Organizations: Not on file  . Attends Archivist Meetings: Not on file  . Marital Status: Not on file     Family History: The patient's family history includes Cancer in his sister; Heart disease in his brother, father, and paternal grandmother; Hypertension in his brother.  ROS:   Please see the history of present illness.    He was chronically exposed to chloridine in his profession years ago.  Bilateral hip discomfort prevents much walking.  Past control with his line of work.  All other systems reviewed and are negative.  EKGs/Labs/Other Studies Reviewed:    The following studies were reviewed today: Imaging with nuclear scintigraphy September 2020: Study Highlights    Nuclear stress EF: 47%. There is inferior wall hypokinesis.  There was no ST segment deviation noted during stress.  Defect 1: There is a medium defect of moderate severity. This is in the inferior distribution  Findings consistent with prior inferior myocardial infarction. No ischemia identified.  This is an intermediate risk study.  Prior CABG.   Perry Furbish, MD     CT scan chest October 2020: IMPRESSION: Grossly stable 4.3  cm ascending thoracic aortic aneurysm. Recommend annual imaging followup by CTA or MRA. This recommendation follows 2010 ACCF/AHA/AATS/ACR/ASA/SCA/SCAI/SIR/STS/SVM Guidelines for the Diagnosis and Management of Patients with Thoracic Aortic Disease. Circulation. 2010; 121: X833-A250. Aortic aneurysm NOS (ICD10-I71.9).  Grossly stable 14 x 13 mm ground-glass opacity seen in right upper lobe. Adenocarcinoma cannot be excluded. Follow up by CT is recommended in 12 months, with continued annual surveillance for a minimum of 3 years.  These recommendations are taken from:  Recommendations for the Management of Subsolid Pulmonary Nodules Detected at CT: A Statement from the [facility] 2013; 266:1, [phone].  Cholelithiasis is noted.  Probable hepatic steatosis.  Coronary  artery calcifications are noted.  Aortic Atherosclerosis (ICD10-I70.0).    EKG:  EKG not repeated  Recent Labs: 02/26/2019: ALT 33; Hemoglobin 12.8; Platelets 239.0 05/13/2019: BUN 33; Creatinine, Ser 1.25; Potassium 4.6; Sodium 137  Recent Lipid Panel    Component Value Date/Time   CHOL 116 02/26/2019 1016   CHOL 123 05/29/2018 1045   TRIG 133.0 02/26/2019 1016   HDL 37.20 (L) 02/26/2019 1016   HDL 41 05/29/2018 1045   CHOLHDL 3 02/26/2019 1016   VLDL 26.6 02/26/2019 1016   LDLCALC 52 02/26/2019 1016   LDLCALC 56 05/29/2018 1045    Physical Exam:    VS:  BP 104/60   Pulse 64   Ht 6\' 1"  (1.854 m)   Wt 240 lb 6.4 oz (109 kg)   SpO2 96%   BMI 31.72 kg/m     Wt Readings from Last 3 Encounters:  10/01/19 240 lb 6.4 oz (109 kg)  04/11/19 237 lb (107.5 kg)  04/04/19 237 lb 12.8 oz (107.9 kg)     GEN: Healthy-appearing. No acute distress HEENT: Normal NECK: No JVD. LYMPHATICS: No lymphadenopathy CARDIAC:  RRR without murmur, gallop, or edema. VASCULAR:  Normal Pulses. No bruits. RESPIRATORY:  Clear to auscultation without rales, wheezing or rhonchi  ABDOMEN: Soft,  non-tender, non-distended, No pulsatile mass, MUSCULOSKELETAL: No deformity  SKIN: Warm and dry NEUROLOGIC:  Alert and oriented x 3 PSYCHIATRIC:  Normal affect   ASSESSMENT:    1. Coronary artery disease involving native coronary artery of native heart without angina pectoris   2. Paroxysmal atrial fibrillation (HCC)   3. Thoracic aortic aneurysm without rupture ([place])   4. Essential hypertension   5. OSA (obstructive sleep apnea)   6. Controlled type 2 diabetes mellitus with other circulatory complication, without long-term current use of insulin ([place])   7. Mixed hyperlipidemia   8. Educated about COVID-19 virus infection   9. Muscle pain    PLAN:    In order of problems listed above:  1. Secondary prevention discussed as outlined below 2. No recurrence of atrial fibrillation 3. Repeat CT scan will be performed in October 2021 4. Blood pressure control is excellent.  Continue beta-blocker therapy as it also helps prevent expansion of aneurysm. 5. Encourage compliance with CPAP 6. Hemoglobin A1c will be checked today 7. LDL cholesterol target less than 70.  Most recently 74 in July.  Lipid panel today. 8. COVID-19 vaccine has been received.  Social distancing and mask wearing encouraged. 9. CPK will be done to rule out myopathy related to low-dose statin therapy.  Overall education and awareness concerning primary/secondary risk prevention was discussed in detail: LDL less than 70, hemoglobin A1c less than 7, blood pressure target less than 130/80 mmHg, >150 minutes of moderate aerobic activity per week, avoidance of smoking, weight control (via diet and exercise), and continued surveillance/management of/for obstructive sleep apnea.    Medication Adjustments/Labs and Tests Ordered: Current medicines are reviewed at length with the patient today.  Concerns regarding medicines are outlined above.  Orders Placed This Encounter  Procedures  . Basic metabolic panel  . Hepatic  function panel  . Lipid panel  . HgB A1c  . CK (Creatine Kinase)   No orders of the defined types were placed in this encounter.   Patient Instructions  Medication Instructions:  Your physician recommends that you continue on your current medications as directed. Please refer to the Current Medication list given to you today.  *If you need a refill on your  cardiac medications before your next appointment, please call your pharmacy*  Lab Work: BMET, Liver, Lipid, A1C and CPK today  If you have labs (blood work) drawn today and your tests are completely normal, you will receive your results only by: Marland Kitchen MyChart Message (if you have MyChart) OR . A paper copy in the mail If you have any lab test that is abnormal or we need to change your treatment, we will call you to review the results.  Testing/Procedures: None  Follow-Up: At [facility], you and your health needs are our priority.  As part of our continuing mission to provide you with exceptional heart care, we have created designated Provider Care Teams.  These Care Teams include your primary Cardiologist (physician) and Advanced Practice Providers (APPs -  Physician Assistants and Nurse Practitioners) who all work together to provide you with the care you need, when you need it.  Your next appointment:   9 month(s)  The format for your next appointment:   In Person  Provider:   You may see Perry Grooms, MD or one of the following Advanced Practice Providers on your designated Care Team:    Truitt Merle, NP  Cecilie Kicks, NP  Kathyrn Drown, NP   Other Instructions      Signed, Perry Grooms, MD  10/01/2019 3:47 PM    [place]

## 2019-10-01 ENCOUNTER — Encounter: Payer: Self-pay | Admitting: Interventional Cardiology

## 2019-10-01 ENCOUNTER — Other Ambulatory Visit: Payer: Self-pay

## 2019-10-01 ENCOUNTER — Ambulatory Visit (INDEPENDENT_AMBULATORY_CARE_PROVIDER_SITE_OTHER): Payer: Medicare Other | Admitting: Interventional Cardiology

## 2019-10-01 VITALS — BP 104/60 | HR 64 | Ht 73.0 in | Wt 240.4 lb

## 2019-10-01 DIAGNOSIS — Z7189 Other specified counseling: Secondary | ICD-10-CM

## 2019-10-01 DIAGNOSIS — I712 Thoracic aortic aneurysm, without rupture, unspecified: Secondary | ICD-10-CM

## 2019-10-01 DIAGNOSIS — I10 Essential (primary) hypertension: Secondary | ICD-10-CM | POA: Diagnosis not present

## 2019-10-01 DIAGNOSIS — E1159 Type 2 diabetes mellitus with other circulatory complications: Secondary | ICD-10-CM

## 2019-10-01 DIAGNOSIS — M791 Myalgia, unspecified site: Secondary | ICD-10-CM | POA: Diagnosis not present

## 2019-10-01 DIAGNOSIS — E782 Mixed hyperlipidemia: Secondary | ICD-10-CM | POA: Diagnosis not present

## 2019-10-01 DIAGNOSIS — I2510 Atherosclerotic heart disease of native coronary artery without angina pectoris: Secondary | ICD-10-CM

## 2019-10-01 DIAGNOSIS — I480 Paroxysmal atrial fibrillation: Secondary | ICD-10-CM | POA: Diagnosis not present

## 2019-10-01 DIAGNOSIS — G4733 Obstructive sleep apnea (adult) (pediatric): Secondary | ICD-10-CM | POA: Diagnosis not present

## 2019-10-01 NOTE — Patient Instructions (Signed)
Medication Instructions:  Your physician recommends that you continue on your current medications as directed. Please refer to the Current Medication list given to you today.  *If you need a refill on your cardiac medications before your next appointment, please call your pharmacy*  Lab Work: BMET, Liver, Lipid, A1C and CPK today  If you have labs (blood work) drawn today and your tests are completely normal, you will receive your results only by: Marland Kitchen MyChart Message (if you have MyChart) OR . A paper copy in the mail If you have any lab test that is abnormal or we need to change your treatment, we will call you to review the results.  Testing/Procedures: None  Follow-Up: At [facility], you and your health needs are our priority.  As part of our continuing mission to provide you with exceptional heart care, we have created designated Provider Care Teams.  These Care Teams include your primary Cardiologist (physician) and Advanced Practice Providers (APPs -  Physician Assistants and Nurse Practitioners) who all work together to provide you with the care you need, when you need it.  Your next appointment:   9 month(s)  The format for your next appointment:   In Person  Provider:   You may see Sinclair Grooms, MD or one of the following Advanced Practice Providers on your designated Care Team:    Truitt Merle, NP  Cecilie Kicks, NP  Kathyrn Drown, NP   Other Instructions

## 2019-10-01 NOTE — Addendum Note (Signed)
Addended by: Loren Racer on: 10/01/2019 03:53 PM   Modules accepted: Orders

## 2019-10-02 LAB — BASIC METABOLIC PANEL
BUN/Creatinine Ratio: 30 — ABNORMAL HIGH (ref 10–24)
BUN: 42 mg/dL — ABNORMAL HIGH (ref 8–27)
CO2: 21 mmol/L (ref 20–29)
Calcium: 10.9 mg/dL — ABNORMAL HIGH (ref 8.6–10.2)
Chloride: 106 mmol/L (ref 96–106)
Creatinine, Ser: 1.42 mg/dL — ABNORMAL HIGH (ref 0.76–1.27)
GFR calc Af Amer: 56 mL/min/{1.73_m2} — ABNORMAL LOW (ref >59)
GFR calc non Af Amer: 48 mL/min/{1.73_m2} — ABNORMAL LOW (ref >59)
Glucose: 112 mg/dL — ABNORMAL HIGH (ref 65–99)
Potassium: 5 mmol/L (ref 3.5–5.2)
Sodium: 141 mmol/L (ref 134–144)

## 2019-10-02 LAB — HEPATIC FUNCTION PANEL
ALT: 34 IU/L (ref 0–44)
AST: 26 IU/L (ref 0–40)
Albumin: 4.6 g/dL (ref 3.7–4.7)
Alkaline Phosphatase: 50 IU/L (ref 39–117)
Bilirubin Total: 0.4 mg/dL (ref 0.0–1.2)
Bilirubin, Direct: 0.13 mg/dL (ref 0.00–0.40)
Total Protein: 7.2 g/dL (ref 6.0–8.5)

## 2019-10-02 LAB — LIPID PANEL
Chol/HDL Ratio: 3.4 ratio (ref 0.0–5.0)
Cholesterol, Total: 119 mg/dL (ref 100–199)
HDL: 35 mg/dL — ABNORMAL LOW (ref >39)
LDL Chol Calc (NIH): 58 mg/dL (ref 0–99)
Triglycerides: 153 mg/dL — ABNORMAL HIGH (ref 0–149)
VLDL Cholesterol Cal: 26 mg/dL (ref 5–40)

## 2019-10-02 LAB — CK: Total CK: 62 U/L (ref 41–331)

## 2019-10-02 LAB — HEMOGLOBIN A1C
Est. average glucose Bld gHb Est-mCnc: 163 mg/dL
Hgb A1c MFr Bld: 7.3 % — ABNORMAL HIGH (ref 4.8–5.6)

## 2019-10-03 ENCOUNTER — Other Ambulatory Visit: Payer: Self-pay | Admitting: *Deleted

## 2019-10-03 DIAGNOSIS — I10 Essential (primary) hypertension: Secondary | ICD-10-CM

## 2019-10-03 MED ORDER — SPIRONOLACTONE 25 MG PO TABS: 12.5000 mg | ORAL_TABLET | Freq: Every day | ORAL | 3 refills | Status: DC

## 2019-10-21 DIAGNOSIS — R109 Unspecified abdominal pain: Secondary | ICD-10-CM | POA: Diagnosis not present

## 2019-10-21 DIAGNOSIS — M546 Pain in thoracic spine: Secondary | ICD-10-CM | POA: Diagnosis not present

## 2019-10-22 ENCOUNTER — Other Ambulatory Visit: Payer: Self-pay | Admitting: Specialist

## 2019-10-22 ENCOUNTER — Ambulatory Visit
Admission: RE | Admit: 2019-10-22 | Discharge: 2019-10-22 | Disposition: A | Discharge status: Home / Self Care | Payer: Medicare Other | Source: Ambulatory Visit | Attending: Specialist | Admitting: Specialist

## 2019-10-22 DIAGNOSIS — M546 Pain in thoracic spine: Secondary | ICD-10-CM

## 2019-10-22 DIAGNOSIS — Z9181 History of falling: Secondary | ICD-10-CM

## 2019-10-22 DIAGNOSIS — R109 Unspecified abdominal pain: Secondary | ICD-10-CM | POA: Diagnosis not present

## 2019-11-03 ENCOUNTER — Telehealth: Payer: Self-pay | Admitting: Family Medicine

## 2019-11-03 DIAGNOSIS — G4730 Sleep apnea, unspecified: Secondary | ICD-10-CM

## 2019-11-03 NOTE — Telephone Encounter (Signed)
Patient is requesting a referral to:   Larey Seat, MD [address], [place], [place] [address]  [phone]   Patient has been to her before and it's been three year since.

## 2019-11-03 NOTE — Telephone Encounter (Signed)
Would you like to see this person for a virtual or just go ahead and send the referral?

## 2019-11-03 NOTE — Telephone Encounter (Signed)
Referral placed.

## 2019-11-17 ENCOUNTER — Ambulatory Visit: Payer: Medicare Other | Attending: Internal Medicine

## 2019-11-17 ENCOUNTER — Other Ambulatory Visit: Payer: Self-pay

## 2019-11-17 DIAGNOSIS — Z20822 Contact with and (suspected) exposure to covid-19: Secondary | ICD-10-CM | POA: Diagnosis not present

## 2019-11-18 ENCOUNTER — Institutional Professional Consult (permissible substitution): Payer: Medicare Other | Admitting: Neurology

## 2019-11-18 LAB — SARS-COV-2, NAA 2 DAY TAT

## 2019-11-18 LAB — NOVEL CORONAVIRUS, NAA: SARS-CoV-2, NAA: DETECTED — AB

## 2019-11-19 ENCOUNTER — Telehealth: Payer: Self-pay | Admitting: Unknown Physician Specialty

## 2019-11-19 NOTE — Telephone Encounter (Signed)
Called to discuss with Perry [name] about Covid symptoms and the use of bamlanivimab, a monoclonal antibody infusion for those with mild to moderate Covid symptoms and at a high risk of hospitalization.     Pt does not qualify for infusion therapy as his symptoms first presented > 10 days prior to timing of infusion. Symptoms tier reviewed as well as criteria for ending isolation. Preventative practices reviewed. Patient verbalized understanding   Patient Active Problem List   Diagnosis Date Noted  . Thoracic aortic aneurysm ([place]) 05/29/2018  . Bradycardia 04/16/2017  . Acute appendicitis with perforation and peritoneal abscess 05/04/2014  . Abscess of abdominal cavity ([place]) 05/04/2014  . Atrial fibrillation ([place]) 04/28/2014  . Hyperlipidemia   . Sleep apnea   . CAD (coronary artery disease)   . Hearing loss   . Diabetes mellitus ([place])

## 2019-12-01 ENCOUNTER — Ambulatory Visit (INDEPENDENT_AMBULATORY_CARE_PROVIDER_SITE_OTHER): Payer: Medicare Other | Admitting: Neurology

## 2019-12-01 ENCOUNTER — Other Ambulatory Visit: Payer: Self-pay

## 2019-12-01 ENCOUNTER — Encounter: Payer: Self-pay | Admitting: Neurology

## 2019-12-01 VITALS — BP 143/83 | HR 55 | Temp 98.3°F | Ht 73.0 in | Wt 235.0 lb

## 2019-12-01 DIAGNOSIS — Z789 Other specified health status: Secondary | ICD-10-CM | POA: Insufficient documentation

## 2019-12-01 DIAGNOSIS — G4709 Other insomnia: Secondary | ICD-10-CM | POA: Diagnosis not present

## 2019-12-01 DIAGNOSIS — I2510 Atherosclerotic heart disease of native coronary artery without angina pectoris: Secondary | ICD-10-CM | POA: Diagnosis not present

## 2019-12-01 DIAGNOSIS — I712 Thoracic aortic aneurysm, without rupture, unspecified: Secondary | ICD-10-CM

## 2019-12-01 DIAGNOSIS — G4730 Sleep apnea, unspecified: Secondary | ICD-10-CM

## 2019-12-01 DIAGNOSIS — E1159 Type 2 diabetes mellitus with other circulatory complications: Secondary | ICD-10-CM

## 2019-12-01 DIAGNOSIS — G4710 Hypersomnia, unspecified: Secondary | ICD-10-CM | POA: Diagnosis not present

## 2019-12-01 DIAGNOSIS — Z87898 Personal history of other specified conditions: Secondary | ICD-10-CM

## 2019-12-01 DIAGNOSIS — I480 Paroxysmal atrial fibrillation: Secondary | ICD-10-CM

## 2019-12-01 MED ORDER — TRAZODONE HCL 50 MG PO TABS: 50.0000 mg | ORAL_TABLET | Freq: Every day | ORAL | 2 refills | Status: DC

## 2019-12-01 NOTE — Addendum Note (Signed)
Addended by: Larey Seat on: 12/01/2019 09:44 AM   Modules accepted: Orders

## 2019-12-01 NOTE — Patient Instructions (Addendum)
Trazodone extended release oral tablets What is this medicine? TRAZODONE (TRAZ oh done) is used to treat depression. This medicine may be used for other purposes; ask your health care provider or pharmacist if you have questions. COMMON BRAND NAME(S): Oleptro What should I tell my health care provider before I take this medicine? They need to know if you have any of these conditions:  attempted suicide or thinking about it  bipolar disorder  bleeding problems  glaucoma  heart disease, or previous heart attack  irregular heart beat  kidney disease  liver disease  low levels of sodium in the blood  an unusual or allergic reaction to trazodone, other medicines, foods, dyes or preservatives  pregnant or trying to get pregnant  breast-feeding How should I use this medicine? Take this medicine by mouth with a glass of water. Follow the directions on the prescription label. Take this medicine on an empty stomach, at least 30 minutes before or 2 hours after food. Do not take with food. Do not crush or chew this medicine. You may break in half along the score line. Take your medicine at bedtime everyday. Do not take your medicine more often than directed. Do not stop taking this medicine suddenly except upon the advice of your doctor. Stopping this medicine too quickly may cause serious side effects or your condition may worsen. A special MedGuide will be given to you by the pharmacist with each prescription and refill. Be sure to read this information carefully each time. Talk to your pediatrician regarding the use of this medicine in children. Special care may be needed. Overdosage: If you think you have taken too much of this medicine contact a poison control center or emergency room at once. NOTE: This medicine is only for you. Do not share this medicine with others. What if I miss a dose? If you miss a dose, take it as soon as you can. If it is almost time for your next dose, take  only that dose. Do not take double or extra doses. What may interact with this medicine? Do not take this medicine with any of the following medications:  certain medicines for fungal infections like fluconazole, itraconazole, ketoconazole, posaconazole, voriconazole  cisapride  dronedarone  linezolid  MAOIs like Carbex, Eldepryl, Marplan, Nardil, and Parnate  mesoridazine  methylene blue (injected into a vein)  pimozide  saquinavir  thioridazine This medicine may also interact with the following medications:  alcohol  antiviral medicines for HIV or AIDS  aspirin and aspirin-like medicines  barbiturates like phenobarbital  certain medicines for blood pressure, heart disease, irregular heart beat  certain medicines for depression, anxiety, or psychotic disturbances  certain medicines for migraine headache like almotriptan, eletriptan, frovatriptan, naratriptan, rizatriptan, sumatriptan, zolmitriptan  certain medicines for seizures like carbamazepine, phenytoin  certain medicines for sleep  certain medicines that treat or prevent blood clots like dalteparin, enoxaparin, warfarin  digoxin  fentanyl  lithium  NSAIDS, medicines for pain and inflammation, like ibuprofen or naproxen  other medicines that prolong the QT interval (cause an abnormal heart rhythm) like dofetilide  rasagiline  supplements like St. John's wort, kava kava, valerian  tramadol  tryptophan This list may not describe all possible interactions. Give your health care provider a list of all the medicines, herbs, non-prescription drugs, or dietary supplements you use. Also tell them if you smoke, drink alcohol, or use illegal drugs. Some items may interact with your medicine. What should I watch for while using this medicine? Tell your  doctor if your symptoms do not get better or if they get worse. Visit your doctor or health care professional for regular checks on your progress. Because it  may take several weeks to see the full effects of this medicine, it is important to continue your treatment as prescribed by your doctor. Patients and their families should watch out for new or worsening thoughts of suicide or depression. Also watch out for sudden changes in feelings such as feeling anxious, agitated, panicky, irritable, hostile, aggressive, impulsive, severely restless, overly excited and hyperactive, or not being able to sleep. If this happens, especially at the beginning of treatment or after a change in dose, call your health care professional. Dennis Bast may get drowsy or dizzy. Do not drive, use machinery, or do anything that needs mental alertness until you know how this medicine affects you. Do not stand or sit up quickly, especially if you are an older patient. This reduces the risk of dizzy or fainting spells. Alcohol may interfere with the effect of this medicine. Avoid alcoholic drinks. This medicine may cause dry eyes and blurred vision. If you wear contact lenses you may feel some discomfort. Lubricating drops may help. See your eye doctor if the problem does not go away or is severe. Your mouth may get dry. Chewing sugarless gum, sucking hard candy and drinking plenty of water may help. Contact your doctor if the problem does not go away or is severe. What side effects may I notice from receiving this medicine? Side effects that you should report to your doctor or health care professional as soon as possible:  allergic reactions like skin rash, itching or hives, swelling of the face, lips, or tongue  elevated mood, decreased need for sleep, racing thoughts, impulsive behavior  confusion  fast, irregular heartbeat  feeling faint or lightheaded, falls  feeling agitated, angry, or irritable  loss of balance or coordination  painful or prolonged erections  restlessness, pacing, inability to keep still  suicidal thoughts or other mood changes  tremors  trouble  sleeping  seizures  unusual bleeding or bruising Side effects that usually do not require medical attention (report to your doctor or health care professional if they continue or are bothersome):  change in sex drive or performance  change in appetite or weight  constipation  headache  muscle aches or pains  nausea This list may not describe all possible side effects. Call your doctor for medical advice about side effects. You may report side effects to FDA at 1-800-FDA-1088. Where should I keep my medicine? Keep out of the reach of children. Store at room temperature between 15 and 30 degrees C (59 to 86 degrees F). Protect from light. Keep container tightly closed. Throw away any unused medicine after the expiration date. NOTE: This sheet is a summary. It may not cover all possible information. If you have questions about this medicine, talk to your doctor, pharmacist, or health care provider.  2020 [employer]vier/Gold Standard (2018-07-16 11:44:32) Sleep Apnea Sleep apnea affects breathing during sleep. It causes breathing to stop for a short time or to become shallow. It can also increase the risk of:  Heart attack.  Stroke.  Being very overweight (obese).  Diabetes.  Heart failure.  Irregular heartbeat. The goal of treatment is to help you breathe normally again. What are the causes? There are three kinds of sleep apnea:  Obstructive sleep apnea. This is caused by a blocked or collapsed airway.  Central sleep apnea. This happens when the  brain does not send the right signals to the muscles that control breathing.  Mixed sleep apnea. This is a combination of obstructive and central sleep apnea. The most common cause of this condition is a collapsed or blocked airway. This can happen if:  Your throat muscles are too relaxed.  Your tongue and tonsils are too large.  You are overweight.  Your airway is too small. What increases the risk?  Being  overweight.  Smoking.  Having a small airway.  Being older.  Being male.  Drinking alcohol.  Taking medicines to calm yourself (sedatives or tranquilizers).  Having family members with the condition. What are the signs or symptoms?  Trouble staying asleep.  Being sleepy or tired during the day.  Getting angry a lot.  Loud snoring.  Headaches in the morning.  Not being able to focus your mind (concentrate).  Forgetting things.  Less interest in sex.  Mood swings.  Personality changes.  Feelings of sadness (depression).  Waking up a lot during the night to pee (urinate).  Dry mouth.  Sore throat. How is this diagnosed?  Your medical history.  A physical exam.  A test that is done when you are sleeping (sleep study). The test is most often done in a sleep lab but may also be done at home. How is this treated?   Sleeping on your side.  Using a medicine to get rid of mucus in your nose (decongestant).  Avoiding the use of alcohol, medicines to help you relax, or certain pain medicines (narcotics).  Losing weight, if needed.  Changing your diet.  Not smoking.  Using a machine to open your airway while you sleep, such as: ? An oral appliance. This is a mouthpiece that shifts your lower jaw forward. ? A CPAP device. This device blows air through a mask when you breathe out (exhale). ? An EPAP device. This has valves that you put in each nostril. ? A BPAP device. This device blows air through a mask when you breathe in (inhale) and breathe out.  Having surgery if other treatments do not work. It is important to get treatment for sleep apnea. Without treatment, it can lead to:  High blood pressure.  Coronary artery disease.  In men, not being able to have an erection (impotence).  Reduced thinking ability. Follow these instructions at home: Lifestyle  Make changes that your doctor recommends.  Eat a healthy diet.  Lose weight if  needed.  Avoid alcohol, medicines to help you relax, and some pain medicines.  Do not use any products that contain nicotine or tobacco, such as cigarettes, e-cigarettes, and chewing tobacco. If you need help quitting, ask your doctor. General instructions  Take over-the-counter and prescription medicines only as told by your doctor.  If you were given a machine to use while you sleep, use it only as told by your doctor.  If you are having surgery, make sure to tell your doctor you have sleep apnea. You may need to bring your device with you.  Keep all follow-up visits as told by your doctor. This is important. Contact a doctor if:  The machine that you were given to use during sleep bothers you or does not seem to be working.  You do not get better.  You get worse. Get help right away if:  Your chest hurts.  You have trouble breathing in enough air.  You have an uncomfortable feeling in your back, arms, or stomach.  You have trouble  talking.  One side of your body feels weak.  A part of your face is hanging down. These symptoms may be an emergency. Do not wait to see if the symptoms will go away. Get medical help right away. Call your local emergency services (911 in the [place] Do not drive yourself to the hospital. Summary  This condition affects breathing during sleep.  The most common cause is a collapsed or blocked airway.  The goal of treatment is to help you breathe normally while you sleep. This information is not intended to replace advice given to you by your health care provider. Make sure you discuss any questions you have with your health care provider. Document Revised: 05/10/2018 Document Reviewed: 03/19/2018 Elsevier Patient Education  [place].

## 2019-12-01 NOTE — Progress Notes (Signed)
SLEEP MEDICINE CLINIC   Provider:  Larey Seat, M D  Referring Provider: Darreld Mclean, MD Primary Care Physician:  Darreld Mclean, MD  Chief Complaint  Patient presents with  . New Patient (Initial Visit)    pt alone, rm 11. pt presents to discuss sleep concerns. he had a SS in 2017 and at one point had attempted a CPAP but had nose bleeds with the CPAP. pt states that he has bee struggling for years with not getting good nights sleep. states that he wakes up frequently during the night. does not associate it to needing to use bathrrom or anything. he states that every day after lunch he is exhausted and worn out.     HPI:   11-30-2019:  Perry [name] is a 74 y.o. male , seen here as a re- referral , having been seen last in  07/15/2016 and before in 03-12-2014 for PSG. Formerly Dr. Everlene Farrier, now with Dr. Lorelei Pont.  So Perry [name] had undergone a sleep study with [place] in 2017 and had attempted CPAP but developed epistaxis and could not tolerate nasal pressure.  Since then he continues to struggle with not getting enough sleep and not getting restorative and refreshing sleep.  This kind of struggling has been going on for years he wakes up frequently during the night it is not because of nocturia it is because he wakes up and cannot go back to sleep.  He is not gasping for air, he has no longer any choking attacks, he just wakes up knowing he can get back to sleep and he is exhausted during the day.  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  He contracted COVID 19 after the vaccine , got short winded, and still dragging along. [place] in February 12 th , March 5th- and got COVID in March. He may have gotten a variant - but he was disappointed. 10 day isolation.    In the interval he has seen a dermatologist for what was  perceived initially as burning mouth syndrome and he has now added dexamethasone-containing mouthwash that he just swishes and swallows no swallowing.  He does have elevated blood sugars and hemoglobin A1c but this has been controlled with Metformin so far.  He had a single episode of atrial fibrillation related to his surgery, has been on Toprol for rate control he is on spironolactone as a potassium saving diuretic, he has been on Crestor for cholesterol and lisinopril for blood pressure he also takes TriCor.  The patient cannot sleep well on his back that was another additional problem with using CPAP before.  Please note that the patient has a history of coronary artery disease, status post open heart surgery CABG, MI myocardial infarction, hearing loss and has remained moderately obese.  He takes a power nap after lunch, he fell asleep at a stop light, and in a meeting. All this may benefit from [facility] if he continues to have apnea. Insomnia- he has racing thoughts, he feels unproductive in daytime and worries about this at night.     2017 introduction.: CD Recently seen Mrs. Maxwell Caul, who told me that her husband has still grades difficulties to find restful and restorative sleep.  He wakes frequently up during the night which she confirmed today he will  play a game of solitaire on his smart phone before he goes to bed, but usually he cannot stay asleep for long periods of time when I saw him last 2-1/2 years ago his chief complaint was that he choked also a lot at night. The sleep choking episodes have stopped. However he does not sleeps through and he has chronic problems with sleep fragmentation and sleep initiation. As a result he has become also excessively daytime sleepy. He underwent a split-night polysomnography on 03/12/2014 given his past medical history of coronary artery disease, status post open heart surgery CABG, hypertension diabetes mellitus, myocardial infarction, inguinal hernia,  hearing loss and moderate obesity. He was diagnosed with an AHI of 24.2 in supine sleep his apnea exacerbated 272 per hour of sleep, he did not enter REM sleep at all. Oxygen nadir was 82% but the total time of desaturation was only 12 minutes. He had few periodic limb movements, he was titrated to 9 cm water pressure CPAP but he stated that he could not tolerate CPAP at all he developed nosebleeds, on a FFM  Airfit F 10- nosebleeding continued.  He stopped using CPAP. He had service through [facility].  He returned the machine. He wants not to sleep on his back. He continues  To snore, to have apnea and sometimes has nosebleeds, rhinitis. His Epworth score is 16. He naps every afternoon at his office, still working. He still works full-time in his own company, has 54 employees. 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 [employer]. He started on metformin. He noted a low heart rate.   Sleep habits are as follows: He is to bed between 8:30 and 9 PM, usually is asleep promptly by 9:30. He has a habit to play a game on his smart phone before falling asleep.   Once asleep he will only sleep for about 3 or 4 hours. He wakes up spontaneously there is no external trigger. He often goes to watch TV.  He usually can fall asleep easily again, he rises at 4 AM. He goes to  the office by about 5 AM. He leaves the office usually at about 3 PM after 10 hours, stops" by the cows" for 2 hours.  Total sleep time: estimated 5 hours or less.  He avoids sleeping on his back, he has acoll, quiet and dark bedroom. No nocturia.   Sleep medical history and family sleep history:  See initial consult.  Social history: married, self employed.  Caffeine- one cup coffee daily/ ETOH 1-2 drinks every night/ no  Tobacco use., quit 40  years ago.   Review of Systems: Out of a complete 14 system review, the patient complains of only the following symptoms, and all other reviewed systems are negative.  see above   How  likely are you to doze in the following situations: 0 = not likely, 1 = slight chance, 2 = moderate chance, 3 = high chance  Sitting and Reading? Watching Television? Sitting inactive in a public place (theater or meeting)? Lying down in the afternoon when circumstances permit? Sitting and talking to someone? Sitting quietly after lunch without alcohol? In a car, while stopped for a few minutes in traffic? As a passenger in a car for an hour without a break?  Total = 19/ 24 !!!    Fatigue severity score 43 / 63 ,  depression score  Also remained at 2/15 points     Social History   Socioeconomic History  .  Marital status: Married    Spouse name: [name]  . Number of children: 2  . Years of education: Masters  . Highest education level: Not on file  Occupational History  . Occupation: Clinical biochemist  Tobacco Use  . Smoking status: Former Smoker    Types: Cigarettes    Quit date: 06/24/1979    Years since quitting: 40.4  . Smokeless tobacco: Former Systems developer    Types: Chew  Substance and Sexual Activity  . Alcohol use: Yes    Alcohol/week: 14.0 standard drinks    Types: 14 Cans of beer per week    Comment: 2 drinks a day  . Drug use: No  . Sexual activity: Yes  Other Topics Concern  . Not on file  Social History Narrative   Patient is married [name]) and lives at home with his wife.   Patient has two adult children.   Patient is working full-time.   Patient has a Master's degree.   Patient drinks one cup of coffee every morning.   Patient is right-handed.   Social Determinants of Health   Financial Resource Strain:   . Difficulty of Paying Living Expenses:   Food Insecurity:   . Worried About Charity fundraiser in the Last Year:   . Arboriculturist in the Last Year:   Transportation Needs:   . Film/video editor (Medical):   Marland Kitchen Lack of Transportation (Non-Medical):   Physical Activity:   . Days of Exercise per Week:   . Minutes of Exercise per Session:     Stress:   . Feeling of Stress :   Social Connections:   . Frequency of Communication with Friends and Family:   . Frequency of Social Gatherings with Friends and Family:   . Attends Religious Services:   . Active Member of Clubs or Organizations:   . Attends Archivist Meetings:   Marland Kitchen Marital Status:   Intimate Partner Violence:   . Fear of Current or Ex-Partner:   . Emotionally Abused:   Marland Kitchen Physically Abused:   . Sexually Abused:     Family History  Problem Relation Age of Onset  . Heart disease Father   . Heart disease Brother   . Hypertension Brother   . Cancer Sister   . Heart disease Paternal Grandmother     Past Medical History:  Diagnosis Date  . A-fib ([place]) 04/2014  . Abscess of abdominal cavity ([place]) 05/04/2014  . Acute appendicitis with perforation and peritoneal abscess 05/04/2014  . Allergy   . CAD (coronary artery disease)   . CAD (coronary artery disease) of artery bypass graft 05/04/2014  . CHF (congestive heart failure) ([place])    pt not aware  . Diabetes mellitus    not on medication - pt not aware of this  . Dyslipidemia 05/04/2014  . Hearing loss    mild  . Hernia, inguinal    right  . Hyperglycemia   . Hypertension   . Ileus, postoperative ([place]) 05/04/2014  . Lumbar disc disease   . Old inferior wall myocardial infarction 2004    Past Surgical History:  Procedure Laterality Date  . COLONOSCOPY    . CORONARY ANGIOPLASTY     multiple  . CORONARY ARTERY BYPASS GRAFT  2004   LIMA to LAD and SVG to RCA (emergent)  . LAPAROSCOPIC APPENDECTOMY N/A 04/21/2014   Procedure: APPENDECTOMY LAPAROSCOPIC;  Surgeon: Rolm Bookbinder, MD;  Location: [facility];  Service: General;  Laterality: N/A;  .  LAPAROSCOPIC APPENDECTOMY N/A 06/24/2014   Procedure: APPENDECTOMY LAPAROSCOPIC ;  Surgeon: Rolm Bookbinder, MD;  Location: [facility];  Service: General;  Laterality: N/A;  . LUMBAR LAMINECTOMY  2001   lumbar  . TONSILLECTOMY      Current Outpatient  Medications  Medication Sig Dispense Refill  . aspirin 81 MG chewable tablet Chew 81 mg by mouth daily.    . fenofibrate (TRICOR) 145 MG tablet TAKE 1 TABLET BY MOUTH DAILY 90 tablet 3  . fish oil-omega-3 fatty acids 1000 MG capsule Take 2 g by mouth daily.      . INVOKANA 100 MG TABS tablet TAKE 1 TABLET BY MOUTH EVERY DAY BEFORE BREAKFAST 30 tablet 6  . lisinopril (ZESTRIL) 40 MG tablet TAKE 1 TABLET BY MOUTH DAILY 90 tablet 3  . metFORMIN (GLUCOPHAGE-XR) 500 MG 24 hr tablet TAKE TWO TABLETS BY MOUTH EVERY MORNING 180 tablet 3  . metoprolol succinate (TOPROL-XL) 50 MG 24 hr tablet Take 1 tablet (50 mg total) by mouth daily. 90 tablet 3  . nitroGLYCERIN (NITROSTAT) 0.4 MG SL tablet Place 0.4 mg under the tongue every 5 (five) minutes as needed for chest pain.    . rosuvastatin (CRESTOR) 5 MG tablet TAKE 1 TABLET BY MOUTH DAILY 90 tablet 3  . spironolactone (ALDACTONE) 25 MG tablet Take 0.5 tablets (12.5 mg total) by mouth daily. 45 tablet 3   No current facility-administered medications for this visit.    Allergies as of 12/01/2019  . (No Active Allergies)    Vitals: BP (!) 143/83   Pulse (!) 55   Temp 98.3 F (36.8 C)   Ht 6\' 1"  (1.854 m)   Wt 235 lb (106.6 kg)   BMI 31.00 kg/m  Last Weight:  Wt Readings from Last 1 Encounters:  12/01/19 235 lb (106.6 kg)   ATF:TDDU mass index is 31 kg/m.     Last Height:   Ht Readings from Last 1 Encounters:  12/01/19 6\' 1"  (1.854 m)    Physical exam:  General: The patient is awake, alert and appears not in acute distress. The patient is well groomed. Head: Normocephalic, atraumatic. Neck is supple. Mallampati 3 = red and swollen uvula, large tongue. , congested nose,    neck circumference: 17.25*. Nasal airflow Cardiovascular:  Regular rate and rhythm, without  murmurs or carotid bruit, and without distended neck veins. Respiratory: Lungs are clear to auscultation. Skin:  With evidence of mild ankle edema, no rash Trunk: The  patient's posture is erect  Neurologic exam : The patient is awake and alert, oriented to place and time.     Attention span & concentration ability appears normal.  Speech is fluent,  without  dysarthria, dysphonia or aphasia.  Mood and affect are appropriate.  Cranial nerves: he lost sense of smell and taste last Month. , 10-2019. Had headaches, a little shortness of breath.  Pupils are equal and briskly reactive to light. Funduscopic exam deferred.  Extraocular movements  in vertical and horizontal planes intact and without nystagmus. Visual fields by finger perimetry are intact. Hearing to finger rub intact.  Facial sensation intact to fine touch. Facial motor strength is symmetric and tongue and uvula move midline. Shoulder shrug was symmetrical.  Motor exam: Normal tone, muscle bulk and symmetric strength in all extremities. Full grip strength.  Sensory:  Fine touch and vibration were tested in all extremities.  Coordination:  Finger-to-nose maneuver normal without evidence of ataxia, dysmetria or tremor. Gait and station: Patient walks without assistive  device and is able unassisted to climb up to the exam table. Strength within normal limits.  Stance is stable and normal.  Deep tendon reflexes: in the upper and lower extremities are symmetric and intact.  Babinski maneuver response was deferred.   The patient was advised of the nature of the diagnosed sleep disorder , the treatment options and risks for general a health and wellness arising from not treating the condition.  I spent more than 35 minutes of face to face time with the patient. Greater than 50% of time was spent in counseling and coordination of care. We have discussed the diagnosis and differential and I answered the patient's questions.     Assessment:  After physical and neurologic examination, review of laboratory studies,  Personal review of imaging studies, reports of other /same  Imaging studies ,  Results of  polysomnography/ neurophysiology testing and pre-existing records as far as provided in visit., my assessment is   1)  Perry [name] has  Remained  excessively daytime sleepy and I strongly recommend treating his obstructive sleep apnea. . It is likely that he has more apnea than indicated by his s last sleep studies - 2017  with an AHI of 24.2, supine AHI of 72. He instinctivly avoids sleeping on his back, and was surrendered to dose so by a full face mask. He developed nosebleeds on CPAP and is not keen on returning to this method of treatment.  He may be good candidate for INspire technology.   He has insomnia, short sleep - but not by desire.    Plan:  Treatment plan and additional workup :  Attended sleep study - he is open to try a non full face mask- CPAP  Rv after PSG - if not CPAP tolerant, then  referral to Inspire  Trazodone trial.    Asencion Partridge Samatha Anspach MD  12/01/2019   CC: Darreld Mclean, Md [address] [place],   [id number]

## 2019-12-04 ENCOUNTER — Telehealth: Payer: Self-pay

## 2019-12-04 NOTE — Telephone Encounter (Signed)
LVM for pt to call me back to schedule sleep study  

## 2019-12-09 ENCOUNTER — Ambulatory Visit (INDEPENDENT_AMBULATORY_CARE_PROVIDER_SITE_OTHER): Payer: Medicare Other | Admitting: Neurology

## 2019-12-09 DIAGNOSIS — G4733 Obstructive sleep apnea (adult) (pediatric): Secondary | ICD-10-CM

## 2019-12-09 DIAGNOSIS — I2510 Atherosclerotic heart disease of native coronary artery without angina pectoris: Secondary | ICD-10-CM

## 2019-12-09 DIAGNOSIS — Z87898 Personal history of other specified conditions: Secondary | ICD-10-CM

## 2019-12-09 DIAGNOSIS — Z789 Other specified health status: Secondary | ICD-10-CM

## 2019-12-09 DIAGNOSIS — G4710 Hypersomnia, unspecified: Secondary | ICD-10-CM

## 2019-12-09 DIAGNOSIS — I712 Thoracic aortic aneurysm, without rupture, unspecified: Secondary | ICD-10-CM

## 2019-12-09 DIAGNOSIS — G4709 Other insomnia: Secondary | ICD-10-CM

## 2019-12-17 ENCOUNTER — Telehealth: Payer: Self-pay | Admitting: Neurology

## 2019-12-17 MED ORDER — BELSOMRA 15 MG PO TABS: ORAL_TABLET | ORAL | 2 refills | Status: DC

## 2019-12-17 MED ORDER — BELSOMRA 15 MG PO TABS: 15.0000 mg | ORAL_TABLET | Freq: Every evening | ORAL | 0 refills | Status: DC | PRN

## 2019-12-17 NOTE — Progress Notes (Signed)
IMPRESSION:   1. Very mild NREM sleep dependent Sleep Apnea (NREM AHI 8.0) with  exacerbation in supine sleep (AHI 23.7/h)  2. Clinically irrelevant Periodic Limb Movement Disorder (PLMD).  3. Mild Snoring.  4. Abnormal EKG - see screen shot with isolated and clustered  PACs.  5. Sustained REM sleep but fragmented NREM sleep.    RECOMMENDATIONS: primarily insomnia, mild Sleep Apnea, mild PLMs.  Knowing the patient's history of struggle with CPAP, I would not  recommend this therapy again. There is just not much apnea, and  he could improve the AHI by sleeping non-supine.  I will prescribe a sleep aid, Belsomra, and see if this helps him  to improve his fragmented sleep.

## 2019-12-17 NOTE — Telephone Encounter (Signed)
Called the pt and reviewed the SSR with him. Advised of the findings and recommendations from Dr Brett Fairy would be to make sure that he is avoiding sleeping on his back. Informed him that we could attempt a medication belsomra since trazodone is not working for him. Pt will have him or his wife come by tomorrow afternoon to get the samples. He was appreciative for the call and the information. He had no further questions.

## 2019-12-17 NOTE — Telephone Encounter (Signed)
-----   Message from Larey Seat, MD sent at 12/17/2019  1:05 PM EDT ----- IMPRESSION:   1. Very mild NREM sleep dependent Sleep Apnea (NREM AHI 8.0) with  exacerbation in supine sleep (AHI 23.7/h)  2. Clinically irrelevant Periodic Limb Movement Disorder (PLMD).  3. Mild Snoring.  4. Abnormal EKG - see screen shot with isolated and clustered  PACs.  5. Sustained REM sleep but fragmented NREM sleep.    RECOMMENDATIONS: primarily insomnia, mild Sleep Apnea, mild PLMs.  Knowing the patient's history of struggle with CPAP, I would not  recommend this therapy again. There is just not much apnea, and  he could improve the AHI by sleeping non-supine.  I will prescribe a sleep aid, Belsomra, and see if this helps him  to improve his fragmented sleep.

## 2019-12-17 NOTE — Procedures (Signed)
PATIENT'S NAME:  Perry [name], Perry [name] DOB:      Apr 12, 1946      MR#:    [id number]     DATE OF RECORDING: 12/09/2019 [facility] REFERRING M.D.:  Lamar Blinks, MD Study Performed:   Baseline Polysomnogram HISTORY:  11-30-2019:  Perry [name] is a 74 y.o. male patient, seen here as a re- referral, having been seen last in 07/15/2016 and before that on 03-12-2014 for PSG. Formerly Dr. Everlene Farrier, now with Dr. Lorelei Pont.  Perry [name] had undergone a sleep study with [place] in 2017 and had attempted CPAP but developed epistaxis and could not tolerate nasal pressure.  Since then he continues to struggle with not getting enough restorative and refreshing sleep.  This struggle has been going on for years, he wakes up frequently during the night (it is not because of nocturia) and cannot go back to sleep. He is not gasping for air, he has no longer any choking attacks, he just wakes up knowing he cannot get back to sleep and he is exhausted during the day.  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   He contracted COVID 19 after the vaccine, got short winded, and feels still as if dragging along. [place] February 12 th, and March 5th- and got COVID in March. He may have gotten a variant - but he was disappointed.   The patient cannot sleep well on his back that was another additional problem with using CPAP before.  Please note that the patient has a history of coronary artery disease, status post open -heart surgery CABG, MI myocardial infarction, hearing loss and has remained moderately obese.  He takes a power nap after lunch, he fell asleep in a meeting.  All this may benefit from [facility] if he continues to have apnea.   The patient endorsed the Epworth Sleepiness Scale at 19/24 points.   The patient's weight 235 pounds with a height of 73 (inches), resulting in a BMI of 31.3  kg/m2. The patient's neck circumference measured 17.2 inches.  CURRENT MEDICATIONS: Aspirin, Tricor, Omega-3, Invokana, Zestril, Glucophage, Toprol-XL, Nitrostat, Aldactone.   PROCEDURE:  This is a multichannel digital polysomnogram utilizing the Somnostar 11.2 system.  Electrodes and sensors were applied and monitored per AASM Specifications.   EEG, EOG, Chin and Limb EMG, were sampled at 200 Hz.  ECG, Snore and Nasal Pressure, Thermal Airflow, Respiratory Effort, CPAP Flow and Pressure, Oximetry was sampled at 50 Hz. Digital video and audio were recorded.      BASELINE STUDY: Lights Out was at 21:35 and Lights On at 04:56.  Total recording time (TRT) was 441.5 minutes, with a total sleep time (TST) of 328 minutes.   The patient's sleep latency was 62.5 minutes.  REM latency was 125.5 minutes.  The sleep efficiency was 74.3 %.     SLEEP ARCHITECTURE: WASO (Wake after sleep onset) was 99.5 minutes.  There were 24.5 minutes in Stage N1, 206 minutes Stage N2, 25 minutes Stage N3 and 72.5 minutes in Stage REM.  The percentage of Stage N1 was 7.5%, Stage N2 was 62.8%, Stage N3 was 7.6% and Stage R (REM sleep) was 22.1%.   RESPIRATORY ANALYSIS:  There were a total of 35 respiratory events:  2 obstructive apneas, 0 central apneas and 1 mixed apnea with a total of 3  apneas and an apnea index (AI) of .5 /hour. There were 32 hypopneas with a hypopnea index of 5.9 /hour.      The total APNEA/HYPOPNEA INDEX (AHI) was 6.4/hour.  1 event occurred in REM sleep and 63 events in NREM. The REM AHI was  0.8 /hour, versus a non-REM AHI of 8.0/h. The patient spent 78.5 minutes of total sleep time in the supine position and 250 minutes in non-supine. The supine AHI was 23.7 versus a non-supine AHI of 1.0/h.  OXYGEN SATURATION & C02:  The Wake baseline 02 saturation was 95%, with the lowest being 84%. Time spent below 89% saturation equaled 6 minutes.  The arousals were noted as: 39 were spontaneous, 3 were associated  with PLMs, 13 were associated with respiratory events.  The patient had a total of 16 Periodic Limb Movements. The Periodic Limb Movement (PLM) Arousal index was 0.5/hour. Audio and video analysis did not show any abnormal or unusual movements, behaviors, phonations or vocalizations.  No nocturia. EKG documented runs of abnormal beats- PACs? - see print out.    IMPRESSION:  1. Very mild NREM sleep dependent Sleep Apnea (NREM AHI 8.0) with exacerbation in supine sleep (AHI 23.7/h) 2. Clinically irrelevant Periodic Limb Movement Disorder (PLMD). 3. Mild Snoring. 4. Abnormal EKG - see screen shot with isolated and clustered PACs.  5. Sustained REM sleep but fragmented NREM sleep.    RECOMMENDATIONS: primarily insomnia, mild Sleep Apnea, mild PLMs.  Knowing the patient's history of struggle with CPAP, I would not recommend this therapy again. There is just not much apnea, and he could improve the AHI by sleeping non-supine. I will prescribe a sleep aid, Belsomra, and see if this helps him to improve his fragmented sleep.   I certify that I have reviewed the entire raw data recording prior to the issuance of this report in accordance with the Standards of Accreditation of the American Academy of Sleep Medicine (AASM)   Larey Seat, MD Diplomat, American Board of Psychiatry and Neurology  Diplomat, American Board of Sleep Medicine Market researcher, [facility] [employer]

## 2019-12-17 NOTE — Addendum Note (Signed)
Addended by: Larey Seat on: 12/17/2019 01:05 PM   Modules accepted: Orders

## 2019-12-25 ENCOUNTER — Other Ambulatory Visit: Payer: Self-pay

## 2019-12-25 MED ORDER — NITROGLYCERIN 0.4 MG SL SUBL: 0.4000 mg | SUBLINGUAL_TABLET | SUBLINGUAL | 5 refills | Status: AC | PRN

## 2020-01-22 ENCOUNTER — Other Ambulatory Visit: Payer: Self-pay | Admitting: Family Medicine

## 2020-01-22 DIAGNOSIS — I10 Essential (primary) hypertension: Secondary | ICD-10-CM

## 2020-01-22 DIAGNOSIS — I25700 Atherosclerosis of coronary artery bypass graft(s), unspecified, with unstable angina pectoris: Secondary | ICD-10-CM

## 2020-02-25 ENCOUNTER — Ambulatory Visit: Payer: Medicare Other | Attending: Internal Medicine

## 2020-02-25 DIAGNOSIS — Z20822 Contact with and (suspected) exposure to covid-19: Secondary | ICD-10-CM | POA: Diagnosis not present

## 2020-02-26 LAB — SARS-COV-2, NAA 2 DAY TAT

## 2020-02-26 LAB — NOVEL CORONAVIRUS, NAA: SARS-CoV-2, NAA: NOT DETECTED

## 2020-03-04 ENCOUNTER — Other Ambulatory Visit: Payer: Self-pay | Admitting: Family Medicine

## 2020-03-04 DIAGNOSIS — I25700 Atherosclerosis of coronary artery bypass graft(s), unspecified, with unstable angina pectoris: Secondary | ICD-10-CM

## 2020-03-04 DIAGNOSIS — I10 Essential (primary) hypertension: Secondary | ICD-10-CM

## 2020-03-09 ENCOUNTER — Other Ambulatory Visit: Payer: Self-pay | Admitting: Family Medicine

## 2020-03-09 DIAGNOSIS — I10 Essential (primary) hypertension: Secondary | ICD-10-CM

## 2020-03-09 DIAGNOSIS — I25700 Atherosclerosis of coronary artery bypass graft(s), unspecified, with unstable angina pectoris: Secondary | ICD-10-CM

## 2020-03-16 ENCOUNTER — Other Ambulatory Visit: Payer: Self-pay | Admitting: Neurology

## 2020-03-24 ENCOUNTER — Other Ambulatory Visit: Payer: Self-pay | Admitting: Family Medicine

## 2020-03-24 DIAGNOSIS — I25700 Atherosclerosis of coronary artery bypass graft(s), unspecified, with unstable angina pectoris: Secondary | ICD-10-CM

## 2020-03-24 DIAGNOSIS — E782 Mixed hyperlipidemia: Secondary | ICD-10-CM

## 2020-03-24 DIAGNOSIS — I10 Essential (primary) hypertension: Secondary | ICD-10-CM

## 2020-03-24 DIAGNOSIS — E119 Type 2 diabetes mellitus without complications: Secondary | ICD-10-CM

## 2020-04-08 MED ORDER — LISINOPRIL 40 MG PO TABS: 40.0000 mg | ORAL_TABLET | Freq: Every day | ORAL | 0 refills | Status: DC

## 2020-04-08 MED ORDER — METFORMIN HCL ER 500 MG PO TB24: 1000.0000 mg | ORAL_TABLET | Freq: Every morning | ORAL | 3 refills | Status: DC

## 2020-04-08 MED ORDER — ROSUVASTATIN CALCIUM 5 MG PO TABS: 5.0000 mg | ORAL_TABLET | Freq: Every day | ORAL | 0 refills | Status: DC

## 2020-04-08 MED ORDER — ASPIRIN 81 MG PO CHEW: 81.0000 mg | CHEWABLE_TABLET | Freq: Every day | ORAL | 0 refills | Status: DC

## 2020-04-08 MED ORDER — METOPROLOL SUCCINATE ER 50 MG PO TB24: 50.0000 mg | ORAL_TABLET | Freq: Every day | ORAL | 0 refills | Status: DC

## 2020-04-08 NOTE — Telephone Encounter (Signed)
Patient called stating he is needing refills until his appointment on 8/16. Refills sent for short supply to pharmacy until he comes in.

## 2020-04-08 NOTE — Addendum Note (Signed)
Addended by: Wynonia Musty A on: 04/08/2020 10:16 AM   Modules accepted: Orders

## 2020-04-15 ENCOUNTER — Telehealth: Payer: Self-pay | Admitting: Family Medicine

## 2020-04-15 DIAGNOSIS — I10 Essential (primary) hypertension: Secondary | ICD-10-CM

## 2020-04-15 DIAGNOSIS — Z125 Encounter for screening for malignant neoplasm of prostate: Secondary | ICD-10-CM

## 2020-04-15 DIAGNOSIS — E782 Mixed hyperlipidemia: Secondary | ICD-10-CM

## 2020-04-15 DIAGNOSIS — I25700 Atherosclerosis of coronary artery bypass graft(s), unspecified, with unstable angina pectoris: Secondary | ICD-10-CM

## 2020-04-15 DIAGNOSIS — D649 Anemia, unspecified: Secondary | ICD-10-CM

## 2020-04-15 DIAGNOSIS — R972 Elevated prostate specific antigen [PSA]: Secondary | ICD-10-CM

## 2020-04-15 DIAGNOSIS — E119 Type 2 diabetes mellitus without complications: Secondary | ICD-10-CM

## 2020-04-15 NOTE — Telephone Encounter (Signed)
Please order appropriate labs. Will call patient to schedule if needed.

## 2020-04-15 NOTE — Telephone Encounter (Signed)
Patient would like to know if he could get his lab drawn before his cpe appointment

## 2020-04-16 NOTE — Telephone Encounter (Signed)
Patient scheduled for lab Wed morning.

## 2020-04-17 ENCOUNTER — Other Ambulatory Visit: Payer: Self-pay | Admitting: Family Medicine

## 2020-04-20 NOTE — Patient Instructions (Addendum)
It was great to see you again today, I will be in touch your labs with you as possible Let's stop spironolactone and see if your BP remains under control- I am not sure if you still need this medication, and stopping it may help with your kidney function  Flu vaccine given today You sprained your left ankle- let me know if not getting back to normal in the next couple of weeks      Health Maintenance After Age 74 After age 30, you are at a higher risk for certain long-term diseases and infections as well as injuries from falls. Falls are a major cause of broken bones and head injuries in people who are older than age 65. Getting regular preventive care can help to keep you healthy and well. Preventive care includes getting regular testing and making lifestyle changes as recommended by your health care provider. Talk with your health care provider about:  Which screenings and tests you should have. A screening is a test that checks for a disease when you have no symptoms.  A diet and exercise plan that is right for you. What should I know about screenings and tests to prevent falls? Screening and testing are the best ways to find a health problem early. Early diagnosis and treatment give you the best chance of managing medical conditions that are common after age 109. Certain conditions and lifestyle choices may make you more likely to have a fall. Your health care provider may recommend:  Regular vision checks. Poor vision and conditions such as cataracts can make you more likely to have a fall. If you wear glasses, make sure to get your prescription updated if your vision changes.  Medicine review. Work with your health care provider to regularly review all of the medicines you are taking, including over-the-counter medicines. Ask your health care provider about any side effects that may make you more likely to have a fall. Tell your health care provider if any medicines that you take make you  feel dizzy or sleepy.  Osteoporosis screening. Osteoporosis is a condition that causes the bones to get weaker. This can make the bones weak and cause them to break more easily.  Blood pressure screening. Blood pressure changes and medicines to control blood pressure can make you feel dizzy.  Strength and balance checks. Your health care provider may recommend certain tests to check your strength and balance while standing, walking, or changing positions.  Foot health exam. Foot pain and numbness, as well as not wearing proper footwear, can make you more likely to have a fall.  Depression screening. You may be more likely to have a fall if you have a fear of falling, feel emotionally low, or feel unable to do activities that you used to do.  Alcohol use screening. Using too much alcohol can affect your balance and may make you more likely to have a fall. What actions can I take to lower my risk of falls? General instructions  Talk with your health care provider about your risks for falling. Tell your health care provider if: ? You fall. Be sure to tell your health care provider about all falls, even ones that seem minor. ? You feel dizzy, sleepy, or off-balance.  Take over-the-counter and prescription medicines only as told by your health care provider. These include any supplements.  Eat a healthy diet and maintain a healthy weight. A healthy diet includes low-fat dairy products, low-fat (lean) meats, and fiber from whole grains,  beans, and lots of fruits and vegetables. Home safety  Remove any tripping hazards, such as rugs, cords, and clutter.  Install safety equipment such as grab bars in bathrooms and safety rails on stairs.  Keep rooms and walkways well-lit. Activity   Follow a regular exercise program to stay fit. This will help you maintain your balance. Ask your health care provider what types of exercise are appropriate for you.  If you need a cane or walker, use it as  recommended by your health care provider.  Wear supportive shoes that have nonskid soles. Lifestyle  Do not drink alcohol if your health care provider tells you not to drink.  If you drink alcohol, limit how much you have: ? 0-1 drink a day for women. ? 0-2 drinks a day for men.  Be aware of how much alcohol is in your drink. In the [place] one drink equals one typical bottle of beer (12 oz), one-half glass of wine (5 oz), or one shot of hard liquor (1 oz).  Do not use any products that contain nicotine or tobacco, such as cigarettes and e-cigarettes. If you need help quitting, ask your health care provider. Summary  Having a healthy lifestyle and getting preventive care can help to protect your health and wellness after age 20.  Screening and testing are the best way to find a health problem early and help you avoid having a fall. Early diagnosis and treatment give you the best chance for managing medical conditions that are more common for people who are older than age 65.  Falls are a major cause of broken bones and head injuries in people who are older than age 59. Take precautions to prevent a fall at home.  Work with your health care provider to learn what changes you can make to improve your health and wellness and to prevent falls. This information is not intended to replace advice given to you by your health care provider. Make sure you discuss any questions you have with your health care provider. Document Revised: 11/14/2018 Document Reviewed: 06/06/2017 Elsevier Patient Education  2020 [employer].

## 2020-04-20 NOTE — Progress Notes (Signed)
[facility] at [facility] [place], [place], [place] [address] [phone]  Date:  04/22/2020   Name:  Perry [name]   DOB:  03/20/1946   MRN:  [id number]  PCP:  Darreld Mclean, MD    Chief Complaint: Annual Exam (flu shot)   History of Present Illness:  Perry [name] is a 74 y.o. 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 Pernell Dupre Urologist Dr. Diona Fanti  Lab Results  Component Value Date   HGBA1C 7.3 (H) 04/21/2020   Eye exam- he is due for this, reminded to schedule Foot exam-due today  flu vaccine- give today  Colon cancer screening due next year Covid series completed in February- he also had covid in the spring, recovered fully Shingrix done Patient had lab work in February of this year  He saw Dr. Brett Fairy in April to discuss sleep apnea and difficulty tolerating CPAP He may have a brief, mild HA.  These may occur every few weeks, lasts just a few minutes   He did turn his ankle recently when he was lifting up a calf who escaped its enclosure He is able to walk, but has some mild pain of the lateral left ankle Pt came in for labs in advance- see results below    BP Readings from Last 3 Encounters:  04/22/20 118/72  12/01/19 (!) 143/83  10/01/19 104/60     Patient Active Problem List   Diagnosis Date Noted  . History of epistaxis 12/01/2019  . Intolerance of continuous positive airway pressure (CPAP) ventilation 12/01/2019  . Hypersomnia with sleep apnea 12/01/2019  . Other insomnia  12/01/2019  . Thoracic aortic aneurysm ([place]) 05/29/2018  . Bradycardia 04/16/2017  . Acute appendicitis with perforation and peritoneal abscess 05/04/2014  . Abscess of abdominal cavity ([place]) 05/04/2014  . Atrial fibrillation ([place]) 04/28/2014  . Hyperlipidemia   . Sleep apnea   . CAD (coronary artery disease)   . Hearing loss   . Diabetes mellitus ([place])     Past Medical History:  Diagnosis Date  . A-fib ([place]) 04/2014  . Abscess of abdominal cavity ([place]) 05/04/2014  . Acute appendicitis with perforation and peritoneal abscess 05/04/2014  . Allergy   . CAD (coronary artery disease)   . CAD (coronary artery disease) of artery bypass graft 05/04/2014  . CHF (congestive heart failure) ([place])    pt not aware  . Diabetes mellitus    not on medication - pt not aware of this  . Dyslipidemia 05/04/2014  . Hearing loss    mild  . Hernia, inguinal    right  . Hyperglycemia   . Hypertension   . Ileus, postoperative ([place]) 05/04/2014  . Lumbar disc disease   . Old inferior wall myocardial infarction 2004    Past Surgical History:  Procedure Laterality Date  . COLONOSCOPY    . CORONARY ANGIOPLASTY     multiple  . CORONARY ARTERY BYPASS GRAFT  2004   LIMA to LAD and SVG to RCA (emergent)  . LAPAROSCOPIC APPENDECTOMY N/A 04/21/2014   Procedure:  APPENDECTOMY LAPAROSCOPIC;  Surgeon: Rolm Bookbinder, MD;  Location: [facility];  Service: General;  Laterality: N/A;  . LAPAROSCOPIC APPENDECTOMY N/A 06/24/2014   Procedure: APPENDECTOMY LAPAROSCOPIC ;  Surgeon: Rolm Bookbinder, MD;  Location: [facility];  Service: General;  Laterality: N/A;  . LUMBAR LAMINECTOMY  2001   lumbar  . TONSILLECTOMY      Social History   Tobacco Use  . Smoking status: Former Smoker    Types: Cigarettes    Quit date: 06/24/1979    Years since quitting: 40.8  . Smokeless tobacco: Former Systems developer    Types: Secondary school teacher  . Vaping Use: Never used  Substance Use Topics  . Alcohol use: Yes    Alcohol/week: 14.0 standard  drinks    Types: 14 Cans of beer per week    Comment: 2 drinks a day  . Drug use: No    Family History  Problem Relation Age of Onset  . Heart disease Father   . Heart disease Brother   . Hypertension Brother   . Cancer Sister   . Heart disease Paternal Grandmother     No Active Allergies  Medication list has been reviewed and updated.  Current Outpatient Medications on File Prior to Visit  Medication Sig Dispense Refill  . aspirin 81 MG chewable tablet Chew 1 tablet (81 mg total) by mouth daily. 30 tablet 0  . fenofibrate (TRICOR) 145 MG tablet TAKE 1 TABLET BY MOUTH DAILY 90 tablet 3  . fish oil-omega-3 fatty acids 1000 MG capsule Take 2 g by mouth daily.      . INVOKANA 100 MG TABS tablet TAKE 1 TABLET BY MOUTH EVERY DAY BEFORE BREAKFAST 30 tablet 0  . lisinopril (ZESTRIL) 40 MG tablet Take 1 tablet (40 mg total) by mouth daily. 30 tablet 0  . metFORMIN (GLUCOPHAGE-XR) 500 MG 24 hr tablet Take 2 tablets (1,000 mg total) by mouth every morning. 180 tablet 3  . metoprolol succinate (TOPROL-XL) 50 MG 24 hr tablet Take 1 tablet (50 mg total) by mouth daily. 30 tablet 0  . nitroGLYCERIN (NITROSTAT) 0.4 MG SL tablet Place 1 tablet (0.4 mg total) under the tongue every 5 (five) minutes as needed for chest pain. 25 tablet 5  . rosuvastatin (CRESTOR) 5 MG tablet Take 1 tablet (5 mg total) by mouth daily. 30 tablet 0  . spironolactone (ALDACTONE) 25 MG tablet Take 0.5 tablets (12.5 mg total) by mouth daily. 45 tablet 3  . Suvorexant (BELSOMRA) 15 MG TABS Take 15 mg by mouth at bedtime as needed. 6 tablet 0  . Suvorexant (BELSOMRA) 15 MG TABS TAKE 1 TABLET BY MOUTH AT BEDTIME AS NEEDED 90 tablet 1  . traZODone (DESYREL) 50 MG tablet Take 1 tablet (50 mg total) by mouth at bedtime. 30 tablet 2   No current facility-administered medications on file prior to visit.    Review of Systems:  As per HPI- otherwise negative.   Physical Examination: Vitals:   04/22/20 1353  BP: 118/72   Pulse: (!) 58  Resp: 16  SpO2: 98%   Vitals:   04/22/20 1353  Weight: 235 lb (106.6 kg)  Height: 6\' 1"  (1.854 m)   Body mass index is 31 kg/m. Ideal Body Weight: Weight in (lb) to have BMI = 25: 189.1  GEN: no acute distress.  Tall build, looks well.  Overweight HEENT: Atraumatic, Normocephalic.   Bilateral TM wnl, oropharynx normal.  PEERL,EOMI.   Ears and Nose: No external deformity. CV: RRR, No  M/G/R. No JVD. No thrill. No extra heart sounds. PULM: CTA B, no wheezes, crackles, rhonchi. No retractions. No resp. distress. No accessory muscle use. ABD: S, NT, ND, +BS. No rebound. No HSM. EXTR: No c/c/e PSYCH: Normally interactive. Conversant.  Foot exam normal today Mild left ankle sprain -he has minimal swelling and tenderness over the lateral tendons.  No instability or bruising.  Normal range of motion of the ankle  Assessment and Plan: Mild anemia  Type 2 diabetes mellitus without complication, without long-term current use of insulin (HCC) - Plan: canagliflozin (INVOKANA) 100 MG TABS tablet  Coronary artery disease involving coronary bypass graft of native heart with unstable angina pectoris (HCC) - Plan: rosuvastatin (CRESTOR) 10 MG tablet, fenofibrate (TRICOR) 145 MG tablet, lisinopril (ZESTRIL) 40 MG tablet, metoprolol succinate (TOPROL-XL) 50 MG 24 hr tablet  Mixed hyperlipidemia - Plan: rosuvastatin (CRESTOR) 10 MG tablet, fenofibrate (TRICOR) 145 MG tablet  Essential hypertension, benign - Plan: lisinopril (ZESTRIL) 40 MG tablet, metoprolol succinate (TOPROL-XL) 50 MG 24 hr tablet  Sleep apnea, unspecified type  Immunization due - Plan: Flu Vaccine QUAD High Dose(Fluad)  Patient today for follow-up visit, Medicare physical Labs have been done already, we discussed these today A1c is acceptable for age PSA trending down Currently taking 5 mg of Crestor, would like to increase and see if we can improve HDL.  He is willing to try 10 mg Mild renal insufficiency.   He is taken spironolactone, but blood pressure is very well controlled.  Would like to have him try stopping this medication and see how he does.  He will give this a try Results for orders placed or performed in visit on 04/21/20  PSA  Result Value Ref Range   PSA 2.86 < OR = 4.0 ng/mL  Lipid panel  Result Value Ref Range   Cholesterol 111 <200 mg/dL   HDL 34 (L) > OR = 40 mg/dL   Triglycerides 146 <150 mg/dL   LDL Cholesterol (Calc) 54 mg/dL (calc)   Total CHOL/HDL Ratio 3.3 <5.0 (calc)   Non-HDL Cholesterol (Calc) 77 <130 mg/dL (calc)  Hemoglobin A1c  Result Value Ref Range   Hgb A1c MFr Bld 7.3 (H) <5.7 % of total Hgb   Mean Plasma Glucose 163 (calc)   eAG (mmol/L) 9.0 (calc)  Basic metabolic panel  Result Value Ref Range   Glucose, Bld 133 (H) 65 - 99 mg/dL   BUN 30 (H) 7 - 25 mg/dL   Creat 1.23 (H) 0.70 - 1.18 mg/dL   BUN/Creatinine Ratio 24 (H) 6 - 22 (calc)   Sodium 138 135 - 146 mmol/L   Potassium 4.5 3.5 - 5.3 mmol/L   Chloride 105 98 - 110 mmol/L   CO2 25 20 - 32 mmol/L   Calcium 9.8 8.6 - 10.3 mg/dL  CBC  Result Value Ref Range   WBC 6.0 3.8 - 10.8 Thousand/uL   RBC 4.49 4.20 - 5.80 Million/uL   Hemoglobin 14.5 13.2 - 17.1 g/dL   HCT 42.9 38 - 50 %   MCV 95.5 80.0 - 100.0 fL   MCH 32.3 27.0 - 33.0 pg   MCHC 33.8 32.0 - 36.0 g/dL   RDW 12.5 11.0 - 15.0 %   Platelets 186 140 - 400 Thousand/uL   MPV 10.2 7.5 - 12.5 fL    This visit occurred during the SARS-CoV-2 public health emergency.  Safety protocols were in place, including screening questions prior to the visit, additional usage of staff PPE, and extensive  cleaning of exam room while observing appropriate contact time as indicated for disinfecting solutions.    Signed Lamar Blinks, MD

## 2020-04-21 ENCOUNTER — Other Ambulatory Visit (INDEPENDENT_AMBULATORY_CARE_PROVIDER_SITE_OTHER): Payer: Medicare Other

## 2020-04-21 ENCOUNTER — Ambulatory Visit
Admission: RE | Admit: 2020-04-21 | Discharge: 2020-04-21 | Disposition: A | Discharge status: Home / Self Care | Payer: Medicare Other | Source: Ambulatory Visit | Attending: Interventional Cardiology | Admitting: Interventional Cardiology

## 2020-04-21 ENCOUNTER — Other Ambulatory Visit: Payer: Self-pay

## 2020-04-21 DIAGNOSIS — I10 Essential (primary) hypertension: Secondary | ICD-10-CM | POA: Diagnosis not present

## 2020-04-21 DIAGNOSIS — E119 Type 2 diabetes mellitus without complications: Secondary | ICD-10-CM

## 2020-04-21 DIAGNOSIS — D649 Anemia, unspecified: Secondary | ICD-10-CM

## 2020-04-21 DIAGNOSIS — E782 Mixed hyperlipidemia: Secondary | ICD-10-CM | POA: Diagnosis not present

## 2020-04-21 DIAGNOSIS — R972 Elevated prostate specific antigen [PSA]: Secondary | ICD-10-CM

## 2020-04-21 DIAGNOSIS — I712 Thoracic aortic aneurysm, without rupture, unspecified: Secondary | ICD-10-CM

## 2020-04-22 ENCOUNTER — Ambulatory Visit (INDEPENDENT_AMBULATORY_CARE_PROVIDER_SITE_OTHER): Payer: Medicare Other | Admitting: Family Medicine

## 2020-04-22 ENCOUNTER — Other Ambulatory Visit: Payer: Self-pay

## 2020-04-22 ENCOUNTER — Encounter: Payer: Self-pay | Admitting: Family Medicine

## 2020-04-22 VITALS — BP 118/72 | HR 58 | Resp 16 | Ht 73.0 in | Wt 235.0 lb

## 2020-04-22 DIAGNOSIS — G4730 Sleep apnea, unspecified: Secondary | ICD-10-CM | POA: Diagnosis not present

## 2020-04-22 DIAGNOSIS — I10 Essential (primary) hypertension: Secondary | ICD-10-CM

## 2020-04-22 DIAGNOSIS — I25700 Atherosclerosis of coronary artery bypass graft(s), unspecified, with unstable angina pectoris: Secondary | ICD-10-CM

## 2020-04-22 DIAGNOSIS — E782 Mixed hyperlipidemia: Secondary | ICD-10-CM

## 2020-04-22 DIAGNOSIS — Z23 Encounter for immunization: Secondary | ICD-10-CM | POA: Diagnosis not present

## 2020-04-22 DIAGNOSIS — D649 Anemia, unspecified: Secondary | ICD-10-CM | POA: Diagnosis not present

## 2020-04-22 DIAGNOSIS — E119 Type 2 diabetes mellitus without complications: Secondary | ICD-10-CM | POA: Diagnosis not present

## 2020-04-22 LAB — PSA: PSA: 2.86 ng/mL (ref <=4.0)

## 2020-04-22 LAB — CBC
HCT: 42.9 % (ref 38.5–50.0)
Hemoglobin: 14.5 g/dL (ref 13.2–17.1)
MCH: 32.3 pg (ref 27.0–33.0)
MCHC: 33.8 g/dL (ref 32.0–36.0)
MCV: 95.5 fL (ref 80.0–100.0)
MPV: 10.2 fL (ref 7.5–12.5)
Platelets: 186 10*3/uL (ref 140–400)
RBC: 4.49 10*6/uL (ref 4.20–5.80)
RDW: 12.5 % (ref 11.0–15.0)
WBC: 6 10*3/uL (ref 3.8–10.8)

## 2020-04-22 LAB — BASIC METABOLIC PANEL
BUN/Creatinine Ratio: 24 (calc) — ABNORMAL HIGH (ref 6–22)
BUN: 30 mg/dL — ABNORMAL HIGH (ref 7–25)
CO2: 25 mmol/L (ref 20–32)
Calcium: 9.8 mg/dL (ref 8.6–10.3)
Chloride: 105 mmol/L (ref 98–110)
Creat: 1.23 mg/dL — ABNORMAL HIGH (ref 0.70–1.18)
Glucose, Bld: 133 mg/dL — ABNORMAL HIGH (ref 65–99)
Potassium: 4.5 mmol/L (ref 3.5–5.3)
Sodium: 138 mmol/L (ref 135–146)

## 2020-04-22 LAB — LIPID PANEL
Cholesterol: 111 mg/dL (ref <200)
HDL: 34 mg/dL — ABNORMAL LOW (ref >=40)
LDL Cholesterol (Calc): 54 mg/dL (calc)
Non-HDL Cholesterol (Calc): 77 mg/dL (calc) (ref <130)
Total CHOL/HDL Ratio: 3.3 (calc) (ref <5.0)
Triglycerides: 146 mg/dL (ref <150)

## 2020-04-22 LAB — HEMOGLOBIN A1C
Hgb A1c MFr Bld: 7.3 % of total Hgb — ABNORMAL HIGH (ref <5.7)
Mean Plasma Glucose: 163 (calc)
eAG (mmol/L): 9 (calc)

## 2020-04-22 LAB — PARATHYROID HORMONE, INTACT (NO CA): PTH: 25 pg/mL (ref 14–64)

## 2020-04-22 MED ORDER — CANAGLIFLOZIN 100 MG PO TABS: ORAL_TABLET | ORAL | 3 refills | Status: DC

## 2020-04-22 MED ORDER — FENOFIBRATE 145 MG PO TABS: 145.0000 mg | ORAL_TABLET | Freq: Every day | ORAL | 3 refills | Status: DC

## 2020-04-22 MED ORDER — ROSUVASTATIN CALCIUM 10 MG PO TABS: 10.0000 mg | ORAL_TABLET | Freq: Every day | ORAL | 3 refills | Status: DC

## 2020-04-22 MED ORDER — METOPROLOL SUCCINATE ER 50 MG PO TB24: 50.0000 mg | ORAL_TABLET | Freq: Every day | ORAL | 3 refills | Status: DC

## 2020-04-22 MED ORDER — LISINOPRIL 40 MG PO TABS: 40.0000 mg | ORAL_TABLET | Freq: Every day | ORAL | 3 refills | Status: DC

## 2020-04-27 DIAGNOSIS — Z23 Encounter for immunization: Secondary | ICD-10-CM | POA: Diagnosis not present

## 2020-05-04 ENCOUNTER — Other Ambulatory Visit: Payer: Self-pay | Admitting: Family Medicine

## 2020-05-06 ENCOUNTER — Other Ambulatory Visit: Payer: Self-pay | Admitting: Family Medicine

## 2020-05-06 DIAGNOSIS — I25700 Atherosclerosis of coronary artery bypass graft(s), unspecified, with unstable angina pectoris: Secondary | ICD-10-CM

## 2020-05-06 DIAGNOSIS — E782 Mixed hyperlipidemia: Secondary | ICD-10-CM

## 2020-05-13 DIAGNOSIS — L570 Actinic keratosis: Secondary | ICD-10-CM | POA: Diagnosis not present

## 2020-05-13 DIAGNOSIS — L918 Other hypertrophic disorders of the skin: Secondary | ICD-10-CM | POA: Diagnosis not present

## 2020-05-13 DIAGNOSIS — L433 Subacute (active) lichen planus: Secondary | ICD-10-CM | POA: Diagnosis not present

## 2020-05-13 DIAGNOSIS — L821 Other seborrheic keratosis: Secondary | ICD-10-CM | POA: Diagnosis not present

## 2020-05-13 DIAGNOSIS — D3617 Benign neoplasm of peripheral nerves and autonomic nervous system of trunk, unspecified: Secondary | ICD-10-CM | POA: Diagnosis not present

## 2020-05-21 ENCOUNTER — Ambulatory Visit: Admission: RE | Admit: 2020-05-21 | Payer: Medicare Other | Source: Ambulatory Visit

## 2020-05-21 ENCOUNTER — Other Ambulatory Visit: Payer: Self-pay | Admitting: Interventional Cardiology

## 2020-05-21 ENCOUNTER — Ambulatory Visit
Admission: RE | Admit: 2020-05-21 | Discharge: 2020-05-21 | Disposition: A | Discharge status: Home / Self Care | Payer: Medicare Other | Source: Ambulatory Visit | Attending: Interventional Cardiology | Admitting: Interventional Cardiology

## 2020-05-21 ENCOUNTER — Other Ambulatory Visit: Payer: Self-pay

## 2020-05-21 DIAGNOSIS — I712 Thoracic aortic aneurysm, without rupture, unspecified: Secondary | ICD-10-CM

## 2020-05-21 DIAGNOSIS — I700 Atherosclerosis of aorta: Secondary | ICD-10-CM | POA: Diagnosis not present

## 2020-05-21 DIAGNOSIS — J8489 Other specified interstitial pulmonary diseases: Secondary | ICD-10-CM | POA: Diagnosis not present

## 2020-05-21 DIAGNOSIS — I2510 Atherosclerotic heart disease of native coronary artery without angina pectoris: Secondary | ICD-10-CM | POA: Diagnosis not present

## 2020-05-21 MED ORDER — IOPAMIDOL (ISOVUE-370) INJECTION 76%
75.0000 mL | Freq: Once | INTRAVENOUS | Status: AC | PRN
  Administered 2020-05-21: 75 mL via INTRAVENOUS

## 2020-08-16 NOTE — Progress Notes (Signed)
Cardiology Office Note:    Date:  08/20/2020   ID:  [name], DOB [birth year], MRN [id number]  PCP:  Darreld Mclean, MD  Cardiologist:  Sinclair Grooms, MD   Referring MD: Darreld Mclean, MD   Chief Complaint  Patient presents with  . Coronary Artery Disease  . Atrial Fibrillation    History of Present Illness:    Perry [name] is a 75 y.o. male with a hx of asthma, hyperlipidemia, PAF,coronary artery disease, status post coronary artery bypass graft2004,thoracic aortic aneurysm, hypertension, diabetes II,obstructive sleep apnea,anddyslipidemia.  No chest pain, and dyspnea on exertion has improved.  He has not had orthopnea.  There is no claudication with physical activity.  He gets greater than 150 minutes of moderate activity per week.  He has not needed nitroglycerin.  There have been no neurological symptoms or events.  Past Medical History:  Diagnosis Date  . A-fib ([place]) 04/2014  . Abscess of abdominal cavity ([place]) 05/04/2014  . Acute appendicitis with perforation and peritoneal abscess 05/04/2014  . Allergy   . CAD (coronary artery disease)   . CAD (coronary artery disease) of artery bypass graft 05/04/2014  . CHF (congestive heart failure) ([place])    pt not aware  . Diabetes mellitus    not on medication - pt not aware of this  . Dyslipidemia 05/04/2014  . Hearing loss    mild  . Hernia, inguinal    right  . Hyperglycemia   . Hypertension   . Ileus, postoperative ([place]) 05/04/2014  . Lumbar disc disease   . Old inferior wall myocardial infarction 2004    Past Surgical History:  Procedure Laterality Date  . COLONOSCOPY    . CORONARY ANGIOPLASTY     multiple  . CORONARY ARTERY BYPASS GRAFT  2004   LIMA to LAD and SVG to RCA (emergent)  . LAPAROSCOPIC APPENDECTOMY N/A 04/21/2014   Procedure: APPENDECTOMY LAPAROSCOPIC;  Surgeon: Rolm Bookbinder, MD;  Location: [facility];  Service: General;  Laterality: N/A;  . LAPAROSCOPIC APPENDECTOMY N/A  06/24/2014   Procedure: APPENDECTOMY LAPAROSCOPIC ;  Surgeon: Rolm Bookbinder, MD;  Location: [facility];  Service: General;  Laterality: N/A;  . LUMBAR LAMINECTOMY  2001   lumbar  . TONSILLECTOMY      Current Medications: Current Meds  Medication Sig  . aspirin (HM ASPIRIN) 81 MG chewable tablet Chew 1 tablet (81 mg total) by mouth daily.  . canagliflozin (INVOKANA) 100 MG TABS tablet TAKE 1 TABLET BY MOUTH EVERY DAY BEFORE BREAKFAST  . fenofibrate (TRICOR) 145 MG tablet Take 1 tablet (145 mg total) by mouth daily.  . fish oil-omega-3 fatty acids 1000 MG capsule Take 2 g by mouth daily.  Marland Kitchen lisinopril (ZESTRIL) 40 MG tablet Take 1 tablet (40 mg total) by mouth daily.  . metFORMIN (GLUCOPHAGE-XR) 500 MG 24 hr tablet Take 2 tablets (1,000 mg total) by mouth every morning.  . metoprolol succinate (TOPROL-XL) 50 MG 24 hr tablet Take 25 mg by mouth daily. Take with or immediately following a meal.  . nitroGLYCERIN (NITROSTAT) 0.4 MG SL tablet Place 1 tablet (0.4 mg total) under the tongue every 5 (five) minutes as needed for chest pain.  . rosuvastatin (CRESTOR) 10 MG tablet Take 1 tablet (10 mg total) by mouth daily.  . Suvorexant (BELSOMRA) 15 MG TABS Take 15 mg by mouth at bedtime as needed.     Allergies:   Patient has no active allergies.   Social History  Socioeconomic History  . Marital status: Married    Spouse name: [name]  . Number of children: 2  . Years of education: Masters  . Highest education level: Not on file  Occupational History  . Occupation: Clinical biochemist  Tobacco Use  . Smoking status: Former Smoker    Types: Cigarettes    Quit date: 06/24/1979    Years since quitting: 41.1  . Smokeless tobacco: Former Systems developer    Types: Secondary school teacher  . Vaping Use: Never used  Substance and Sexual Activity  . Alcohol use: Yes    Alcohol/week: 14.0 standard drinks    Types: 14 Cans of beer per week    Comment: 2 drinks a day  . Drug use: No  . Sexual activity: Yes   Other Topics Concern  . Not on file  Social History Narrative   Patient is married [name]) and lives at home with his wife.   Patient has two adult children.   Patient is working full-time.   Patient has a Master's degree.   Patient drinks one cup of coffee every morning.   Patient is right-handed.   Social Determinants of Health   Financial Resource Strain: Not on file  Food Insecurity: Not on file  Transportation Needs: Not on file  Physical Activity: Not on file  Stress: Not on file  Social Connections: Not on file     Family History: The patient's family history includes Cancer in his sister; Heart disease in his brother, father, and paternal grandmother; Hypertension in his brother.  ROS:   Please see the history of present illness.    He has occasional cramps in his hands and feet.  He is having difficulty staying asleep.  He is now on Belsomra.  He is worried about the possibility of developing cancer, as he has been exterminator as a Medical illustrator.  He is now 8 weeks off alcohol.  States that he always had 2 drinks each night after work.  He hopes that this will improve his overall metabolic state.  All other systems reviewed and are negative.  EKGs/Labs/Other Studies Reviewed:    The following studies were reviewed today:  Hemoglobin A1c was 7.3 in September.  LDL 54.  Creatinine 1.23.  Potassium 4.5.  CT scan chest: 05/21/2020  IMPRESSION: 1. Stable 4.1 cm ascending thoracic aortic aneurysm without complicating features. Recommend annual imaging followup by CTA or MRA. This recommendation follows 2010 ACCF/AHA/AATS/ACR/ASA/SCA/SCAI/SIR/STS/SVM Guidelines for the Diagnosis and Management of Patients with Thoracic Aortic Disease. Circulation. 2010; 121: X937-J696. Aortic aneurysm NOS (ICD10-I71.9) 2. Coronary calcifications, post CABG. 3. Cholelithiasis. 4. 1.5 cm poorly marginated ground-glass opacity, posterior right upper lobe, largely stable since previous,  nonspecific. Recommend continued surveillance.  Aortic Atherosclerosis (ICD10-I70.0).  EKG:  EKG sinus rhythm with first-degree AV block, 238 ms.  Compared to August 2020 the heart rate today is slightly faster but otherwise no different.  Recent Labs: 10/01/2019: ALT 34 04/21/2020: BUN 30; Creat 1.23; Hemoglobin 14.5; Platelets 186; Potassium 4.5; Sodium 138  Recent Lipid Panel    Component Value Date/Time   CHOL 111 04/21/2020 0843   CHOL 119 10/01/2019 1549   TRIG 146 04/21/2020 0843   HDL 34 (L) 04/21/2020 0843   HDL 35 (L) 10/01/2019 1549   CHOLHDL 3.3 04/21/2020 0843   VLDL 26.6 02/26/2019 1016   LDLCALC 54 04/21/2020 0843    Physical Exam:    VS:  BP 108/64   Pulse 60   Ht 6\' 1"  (1.854 m)  Wt 230 lb 12.8 oz (104.7 kg)   SpO2 97%   BMI 30.45 kg/m     Wt Readings from Last 3 Encounters:  08/20/20 230 lb 12.8 oz (104.7 kg)  04/22/20 235 lb (106.6 kg)  12/01/19 235 lb (106.6 kg)     GEN: Overweight. No acute distress HEENT: Normal NECK: No JVD. LYMPHATICS: No lymphadenopathy CARDIAC: No murmur. RRR no gallop, or edema. VASCULAR:  Normal Pulses. No bruits. RESPIRATORY:  Clear to auscultation without rales, wheezing or rhonchi  ABDOMEN: Soft, non-tender, non-distended, No pulsatile mass, MUSCULOSKELETAL: No deformity  SKIN: Warm and dry NEUROLOGIC:  Alert and oriented x 3 PSYCHIATRIC:  Normal affect   ASSESSMENT:    1. Coronary artery disease involving native coronary artery of native heart without angina pectoris   2. Essential hypertension, benign   3. Paroxysmal atrial fibrillation (HCC)   4. Thoracic aortic aneurysm without rupture ([place])   5. OSA (obstructive sleep apnea)   6. Controlled type 2 diabetes mellitus with other circulatory complication, without long-term current use of insulin ([place])   7. Mixed hyperlipidemia   8. Educated about COVID-19 virus infection    PLAN:    In order of problems listed above:  1. Secondary prevention reviewed.   A1c is not as controlled as possible.  He understands a target is less than 7.  He has discontinued alcohol use.  Hopefully this will help. 2. Excellent current blood pressure on Zestril and Toprol.  Target less than 130/80. 3. No complaints of palpitations or clinical instances of atrial fib. 4. Stable at 4.1 cm. 5. With Dr. Marnee Guarneri Dohmeier.  Sleep continues to be an issue.  Not getting 6 hours per night. 6. On SGLT2 therapy.  Strongly data exists for dapagliflozin and empagliflozin.  He is on canagliflozin. 7. Continue Tricor and Crestor.  He gets cramps, possibly related to his lipid therapy. 8. Vaccinated, boosted, and practicing mitigation.  Overall education and awareness concerning secondary risk prevention was discussed in detail: LDL less than 70, hemoglobin A1c less than 7, blood pressure target less than 130/80 mmHg, >150 minutes of moderate aerobic activity per week, avoidance of smoking, weight control (via diet and exercise), and continued surveillance/management of/for obstructive sleep apnea.  74-month follow-up with team member.  1 year follow-up with me.  Check A1c today along with lipid panel and electrolytes.  Medication Adjustments/Labs and Tests Ordered: Current medicines are reviewed at length with the patient today.  Concerns regarding medicines are outlined above.  Orders Placed This Encounter  Procedures  . Lipid panel  . Hepatic function panel  . Basic metabolic panel  . HgB A1c  . CBC  . EKG 12-Lead   No orders of the defined types were placed in this encounter.   Patient Instructions  Medication Instructions:  Your physician recommends that you continue on your current medications as directed. Please refer to the Current Medication list given to you today.  *If you need a refill on your cardiac medications before your next appointment, please call your pharmacy*   Lab Work: BMET, Liver, Lipid, CBC, and A1C today  If you have labs (blood work) drawn  today and your tests are completely normal, you will receive your results only by: Marland Kitchen MyChart Message (if you have MyChart) OR . A paper copy in the mail If you have any lab test that is abnormal or we need to change your treatment, we will call you to review the results.   Testing/Procedures: None  Follow-Up: At [facility], you and your health needs are our priority.  As part of our continuing mission to provide you with exceptional heart care, we have created designated Provider Care Teams.  These Care Teams include your primary Cardiologist (physician) and Advanced Practice Providers (APPs -  Physician Assistants and Nurse Practitioners) who all work together to provide you with the care you need, when you need it.  We recommend signing up for the patient portal called "MyChart".  Sign up information is provided on this After Visit Summary.  MyChart is used to connect with patients for Virtual Visits (Telemedicine).  Patients are able to view lab/test results, encounter notes, upcoming appointments, etc.  Non-urgent messages can be sent to your provider as well.   To learn more about what you can do with MyChart, go to [email or web address].    Your next appointment:   6 month(s)  The format for your next appointment:   In Person  Provider:   You will see one of the following Advanced Practice Providers on your designated Care Team:    Cecilie Kicks, NP  Kathyrn Drown, NP  Then, Sinclair Grooms, MD will plan to see you again in 1 year(s).   Other Instructions      Signed, Sinclair Grooms, MD  08/20/2020 9:37 AM    [place]

## 2020-08-20 ENCOUNTER — Other Ambulatory Visit: Payer: Self-pay

## 2020-08-20 ENCOUNTER — Encounter: Payer: Self-pay | Admitting: Interventional Cardiology

## 2020-08-20 ENCOUNTER — Ambulatory Visit (INDEPENDENT_AMBULATORY_CARE_PROVIDER_SITE_OTHER): Payer: Medicare Other | Admitting: Interventional Cardiology

## 2020-08-20 VITALS — BP 108/64 | HR 60 | Ht 73.0 in | Wt 230.8 lb

## 2020-08-20 DIAGNOSIS — G4733 Obstructive sleep apnea (adult) (pediatric): Secondary | ICD-10-CM | POA: Diagnosis not present

## 2020-08-20 DIAGNOSIS — E1159 Type 2 diabetes mellitus with other circulatory complications: Secondary | ICD-10-CM

## 2020-08-20 DIAGNOSIS — I712 Thoracic aortic aneurysm, without rupture, unspecified: Secondary | ICD-10-CM

## 2020-08-20 DIAGNOSIS — I2510 Atherosclerotic heart disease of native coronary artery without angina pectoris: Secondary | ICD-10-CM

## 2020-08-20 DIAGNOSIS — Z7189 Other specified counseling: Secondary | ICD-10-CM

## 2020-08-20 DIAGNOSIS — E782 Mixed hyperlipidemia: Secondary | ICD-10-CM | POA: Diagnosis not present

## 2020-08-20 DIAGNOSIS — I10 Essential (primary) hypertension: Secondary | ICD-10-CM

## 2020-08-20 DIAGNOSIS — I480 Paroxysmal atrial fibrillation: Secondary | ICD-10-CM | POA: Diagnosis not present

## 2020-08-20 NOTE — Patient Instructions (Signed)
Medication Instructions:  Your physician recommends that you continue on your current medications as directed. Please refer to the Current Medication list given to you today.  *If you need a refill on your cardiac medications before your next appointment, please call your pharmacy*   Lab Work: BMET, Liver, Lipid, CBC, and A1C today  If you have labs (blood work) drawn today and your tests are completely normal, you will receive your results only by: Marland Kitchen MyChart Message (if you have MyChart) OR . A paper copy in the mail If you have any lab test that is abnormal or we need to change your treatment, we will call you to review the results.   Testing/Procedures: None   Follow-Up: At [facility], you and your health needs are our priority.  As part of our continuing mission to provide you with exceptional heart care, we have created designated Provider Care Teams.  These Care Teams include your primary Cardiologist (physician) and Advanced Practice Providers (APPs -  Physician Assistants and Nurse Practitioners) who all work together to provide you with the care you need, when you need it.  We recommend signing up for the patient portal called "MyChart".  Sign up information is provided on this After Visit Summary.  MyChart is used to connect with patients for Virtual Visits (Telemedicine).  Patients are able to view lab/test results, encounter notes, upcoming appointments, etc.  Non-urgent messages can be sent to your provider as well.   To learn more about what you can do with MyChart, go to [email or web address].    Your next appointment:   6 month(s)  The format for your next appointment:   In Person  Provider:   You will see one of the following Advanced Practice Providers on your designated Care Team:    Cecilie Kicks, NP  Kathyrn Drown, NP  Then, Sinclair Grooms, MD will plan to see you again in 1 year(s).   Other Instructions

## 2020-08-21 LAB — BASIC METABOLIC PANEL
BUN/Creatinine Ratio: 28 — ABNORMAL HIGH (ref 10–24)
BUN: 36 mg/dL — ABNORMAL HIGH (ref 8–27)
CO2: 22 mmol/L (ref 20–29)
Calcium: 10.4 mg/dL — ABNORMAL HIGH (ref 8.6–10.2)
Chloride: 105 mmol/L (ref 96–106)
Creatinine, Ser: 1.27 mg/dL (ref 0.76–1.27)
GFR calc Af Amer: 64 mL/min/{1.73_m2} (ref >59)
GFR calc non Af Amer: 55 mL/min/{1.73_m2} — ABNORMAL LOW (ref >59)
Glucose: 142 mg/dL — ABNORMAL HIGH (ref 65–99)
Potassium: 4.6 mmol/L (ref 3.5–5.2)
Sodium: 139 mmol/L (ref 134–144)

## 2020-08-21 LAB — LIPID PANEL
Chol/HDL Ratio: 3.3 ratio (ref 0.0–5.0)
Cholesterol, Total: 130 mg/dL (ref 100–199)
HDL: 40 mg/dL (ref >39)
LDL Chol Calc (NIH): 71 mg/dL (ref 0–99)
Triglycerides: 103 mg/dL (ref 0–149)
VLDL Cholesterol Cal: 19 mg/dL (ref 5–40)

## 2020-08-21 LAB — HEPATIC FUNCTION PANEL
ALT: 29 IU/L (ref 0–44)
AST: 24 IU/L (ref 0–40)
Albumin: 4.4 g/dL (ref 3.7–4.7)
Alkaline Phosphatase: 52 IU/L (ref 44–121)
Bilirubin Total: 0.5 mg/dL (ref 0.0–1.2)
Bilirubin, Direct: 0.2 mg/dL (ref 0.00–0.40)
Total Protein: 6.6 g/dL (ref 6.0–8.5)

## 2020-08-21 LAB — CBC
Hematocrit: 41.5 % (ref 37.5–51.0)
Hemoglobin: 14.4 g/dL (ref 13.0–17.7)
MCH: 31.8 pg (ref 26.6–33.0)
MCHC: 34.7 g/dL (ref 31.5–35.7)
MCV: 92 fL (ref 79–97)
Platelets: 220 10*3/uL (ref 150–450)
RBC: 4.53 x10E6/uL (ref 4.14–5.80)
RDW: 12 % (ref 11.6–15.4)
WBC: 6.1 10*3/uL (ref 3.4–10.8)

## 2020-08-21 LAB — HEMOGLOBIN A1C
Est. average glucose Bld gHb Est-mCnc: 166 mg/dL
Hgb A1c MFr Bld: 7.4 % — ABNORMAL HIGH (ref 4.8–5.6)

## 2020-08-25 ENCOUNTER — Other Ambulatory Visit: Payer: Self-pay | Admitting: Interventional Cardiology

## 2020-08-25 DIAGNOSIS — I10 Essential (primary) hypertension: Secondary | ICD-10-CM

## 2020-09-10 DIAGNOSIS — Z20822 Contact with and (suspected) exposure to covid-19: Secondary | ICD-10-CM | POA: Diagnosis not present

## 2020-09-10 DIAGNOSIS — Z03818 Encounter for observation for suspected exposure to other biological agents ruled out: Secondary | ICD-10-CM | POA: Diagnosis not present

## 2020-09-26 ENCOUNTER — Other Ambulatory Visit: Payer: Self-pay | Admitting: Neurology

## 2020-10-13 ENCOUNTER — Other Ambulatory Visit: Payer: Self-pay | Admitting: Interventional Cardiology

## 2020-10-13 DIAGNOSIS — I10 Essential (primary) hypertension: Secondary | ICD-10-CM

## 2020-10-28 ENCOUNTER — Ambulatory Visit: Payer: Medicare Other | Admitting: Family Medicine

## 2020-11-03 ENCOUNTER — Ambulatory Visit: Payer: Medicare Other | Admitting: Family Medicine

## 2020-11-03 DIAGNOSIS — M542 Cervicalgia: Secondary | ICD-10-CM | POA: Diagnosis not present

## 2020-11-11 DIAGNOSIS — M2392 Unspecified internal derangement of left knee: Secondary | ICD-10-CM | POA: Diagnosis not present

## 2020-11-11 DIAGNOSIS — M25562 Pain in left knee: Secondary | ICD-10-CM | POA: Diagnosis not present

## 2020-11-24 ENCOUNTER — Other Ambulatory Visit: Payer: Self-pay | Admitting: Neurology

## 2020-12-26 ENCOUNTER — Other Ambulatory Visit: Payer: Self-pay | Admitting: Neurology

## 2021-01-04 ENCOUNTER — Other Ambulatory Visit: Payer: Self-pay | Admitting: Neurology

## 2021-01-04 ENCOUNTER — Encounter: Payer: Self-pay | Admitting: Neurology

## 2021-01-05 ENCOUNTER — Other Ambulatory Visit: Payer: Self-pay | Admitting: Neurology

## 2021-01-10 ENCOUNTER — Other Ambulatory Visit: Payer: Self-pay | Admitting: Family Medicine

## 2021-01-10 ENCOUNTER — Encounter: Payer: Self-pay | Admitting: Family Medicine

## 2021-01-10 MED ORDER — BELSOMRA 15 MG PO TABS: 1.0000 | ORAL_TABLET | Freq: Every evening | ORAL | 5 refills | Status: DC | PRN

## 2021-01-10 NOTE — Telephone Encounter (Signed)
Pt states that Dr. Maye Hides his sleep study doctor informed him to have his primary provide his rx for belsamra since he has a primary doctor and he does not have see her as offten.Marland Kitchen Please advise

## 2021-01-10 NOTE — Telephone Encounter (Signed)
I have pended refill request. Last refilled 11/24/2020 by DR. Dohmeier.

## 2021-02-24 ENCOUNTER — Other Ambulatory Visit: Payer: Self-pay | Admitting: Family Medicine

## 2021-02-24 DIAGNOSIS — I25700 Atherosclerosis of coronary artery bypass graft(s), unspecified, with unstable angina pectoris: Secondary | ICD-10-CM

## 2021-02-24 DIAGNOSIS — I10 Essential (primary) hypertension: Secondary | ICD-10-CM

## 2021-02-27 DIAGNOSIS — Z23 Encounter for immunization: Secondary | ICD-10-CM | POA: Diagnosis not present

## 2021-03-16 DIAGNOSIS — H35373 Puckering of macula, bilateral: Secondary | ICD-10-CM | POA: Diagnosis not present

## 2021-03-16 DIAGNOSIS — H33322 Round hole, left eye: Secondary | ICD-10-CM | POA: Diagnosis not present

## 2021-03-16 DIAGNOSIS — H25813 Combined forms of age-related cataract, bilateral: Secondary | ICD-10-CM | POA: Diagnosis not present

## 2021-03-16 DIAGNOSIS — H31092 Other chorioretinal scars, left eye: Secondary | ICD-10-CM | POA: Diagnosis not present

## 2021-03-16 DIAGNOSIS — D3132 Benign neoplasm of left choroid: Secondary | ICD-10-CM | POA: Diagnosis not present

## 2021-03-25 ENCOUNTER — Other Ambulatory Visit: Payer: Self-pay | Admitting: Family Medicine

## 2021-03-25 DIAGNOSIS — I25700 Atherosclerosis of coronary artery bypass graft(s), unspecified, with unstable angina pectoris: Secondary | ICD-10-CM

## 2021-03-25 DIAGNOSIS — E782 Mixed hyperlipidemia: Secondary | ICD-10-CM

## 2021-03-25 DIAGNOSIS — E119 Type 2 diabetes mellitus without complications: Secondary | ICD-10-CM

## 2021-03-29 DIAGNOSIS — K635 Polyp of colon: Secondary | ICD-10-CM | POA: Diagnosis not present

## 2021-03-29 DIAGNOSIS — D120 Benign neoplasm of cecum: Secondary | ICD-10-CM | POA: Diagnosis not present

## 2021-03-29 DIAGNOSIS — D123 Benign neoplasm of transverse colon: Secondary | ICD-10-CM | POA: Diagnosis not present

## 2021-03-29 DIAGNOSIS — Z86010 Personal history of colonic polyps: Secondary | ICD-10-CM | POA: Diagnosis not present

## 2021-03-31 DIAGNOSIS — K635 Polyp of colon: Secondary | ICD-10-CM | POA: Diagnosis not present

## 2021-03-31 DIAGNOSIS — D123 Benign neoplasm of transverse colon: Secondary | ICD-10-CM | POA: Diagnosis not present

## 2021-03-31 DIAGNOSIS — D120 Benign neoplasm of cecum: Secondary | ICD-10-CM | POA: Diagnosis not present

## 2021-04-18 DIAGNOSIS — H33322 Round hole, left eye: Secondary | ICD-10-CM | POA: Diagnosis not present

## 2021-04-19 ENCOUNTER — Other Ambulatory Visit: Payer: Self-pay | Admitting: *Deleted

## 2021-04-19 DIAGNOSIS — I712 Thoracic aortic aneurysm, without rupture, unspecified: Secondary | ICD-10-CM

## 2021-04-24 ENCOUNTER — Other Ambulatory Visit: Payer: Self-pay | Admitting: Family Medicine

## 2021-04-24 DIAGNOSIS — E119 Type 2 diabetes mellitus without complications: Secondary | ICD-10-CM

## 2021-04-24 DIAGNOSIS — E782 Mixed hyperlipidemia: Secondary | ICD-10-CM

## 2021-04-24 DIAGNOSIS — I25700 Atherosclerosis of coronary artery bypass graft(s), unspecified, with unstable angina pectoris: Secondary | ICD-10-CM

## 2021-05-02 DIAGNOSIS — Z20828 Contact with and (suspected) exposure to other viral communicable diseases: Secondary | ICD-10-CM | POA: Diagnosis not present

## 2021-05-04 ENCOUNTER — Other Ambulatory Visit: Payer: Self-pay | Admitting: Family Medicine

## 2021-05-04 DIAGNOSIS — E782 Mixed hyperlipidemia: Secondary | ICD-10-CM

## 2021-05-04 DIAGNOSIS — I25700 Atherosclerosis of coronary artery bypass graft(s), unspecified, with unstable angina pectoris: Secondary | ICD-10-CM

## 2021-05-04 DIAGNOSIS — I10 Essential (primary) hypertension: Secondary | ICD-10-CM

## 2021-05-10 DIAGNOSIS — D224 Melanocytic nevi of scalp and neck: Secondary | ICD-10-CM | POA: Diagnosis not present

## 2021-05-10 DIAGNOSIS — L918 Other hypertrophic disorders of the skin: Secondary | ICD-10-CM | POA: Diagnosis not present

## 2021-05-10 DIAGNOSIS — L821 Other seborrheic keratosis: Secondary | ICD-10-CM | POA: Diagnosis not present

## 2021-05-10 DIAGNOSIS — L570 Actinic keratosis: Secondary | ICD-10-CM | POA: Diagnosis not present

## 2021-05-10 DIAGNOSIS — D1801 Hemangioma of skin and subcutaneous tissue: Secondary | ICD-10-CM | POA: Diagnosis not present

## 2021-05-10 DIAGNOSIS — L433 Subacute (active) lichen planus: Secondary | ICD-10-CM | POA: Diagnosis not present

## 2021-05-19 ENCOUNTER — Ambulatory Visit
Admission: RE | Admit: 2021-05-19 | Discharge: 2021-05-19 | Disposition: A | Discharge status: Home / Self Care | Payer: Medicare Other | Source: Ambulatory Visit | Attending: Interventional Cardiology | Admitting: Interventional Cardiology

## 2021-05-19 DIAGNOSIS — R918 Other nonspecific abnormal finding of lung field: Secondary | ICD-10-CM | POA: Diagnosis not present

## 2021-05-19 DIAGNOSIS — I712 Thoracic aortic aneurysm, without rupture, unspecified: Secondary | ICD-10-CM | POA: Diagnosis not present

## 2021-05-19 MED ORDER — IOPAMIDOL (ISOVUE-370) INJECTION 76%
75.0000 mL | Freq: Once | INTRAVENOUS | Status: AC | PRN
  Administered 2021-05-19: 75 mL via INTRAVENOUS

## 2021-05-20 ENCOUNTER — Telehealth: Payer: Self-pay | Admitting: Interventional Cardiology

## 2021-05-20 NOTE — Telephone Encounter (Signed)
Tin from [facility]iology is calling to report CT Chest results

## 2021-05-20 NOTE — Telephone Encounter (Signed)
Cta chest overread:  IMPRESSION: 1. Stable mild aneurysmal disease of the ascending thoracic aorta measuring up to 4.1 cm in estimated maximal diameter. Recommend annual imaging followup by CTA or MRA  Will route to Dr. Tamala Julian to review.

## 2021-05-24 ENCOUNTER — Other Ambulatory Visit: Payer: Self-pay | Admitting: Family Medicine

## 2021-05-24 DIAGNOSIS — I25700 Atherosclerosis of coronary artery bypass graft(s), unspecified, with unstable angina pectoris: Secondary | ICD-10-CM

## 2021-05-24 DIAGNOSIS — E782 Mixed hyperlipidemia: Secondary | ICD-10-CM

## 2021-05-24 DIAGNOSIS — E119 Type 2 diabetes mellitus without complications: Secondary | ICD-10-CM

## 2021-05-27 NOTE — Patient Instructions (Addendum)
Good to see you again today!  I will be in touch with your labs asap  Flu shot today I will be in touch with your labs and with a plan for your lung finding Please get your covid vaccine asap as well We will get your clavicle x-ray today

## 2021-05-27 NOTE — Progress Notes (Addendum)
[facility] at [employer] [place], [place], [place] [address] [phone]  Date:  05/30/2021   Name:  Perry [name]   DOB:  01/30/1946   MRN:  [id number]  PCP:  Darreld Mclean, MD    Chief Complaint: Annual office visit (Concerns/ questions: inquires about covid booster and Shingrix /Flu shot today: yes/Eye exam: Dr Patel/Foot exam due /)   History of Present Illness:  [name] is a 75 y.o. 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  He also helps his son out some with his Gaffer Dr. Pernell Dupre Urologist Dr. Diona Fanti He has cataracts He has some left knee pain- his ortho PA is helping him with this   He thinks he might have broken his clavicle - he was carrying a heavy bag of feed and felt a painful pull at his medial clavicle a month or so ago-   Eye exam Covid booster- he plans to do asap  Colon screening- done this year  Flu vaccine- give today  Labs- update today   His aneurysm is stable per CT earlier this month - however, the CT did how a concerning finding as below- he mentions this to me today.  2. Enlarging ground-glass opacity in the posterior right upper lobe which remains difficult to measure and does not have a definitive solid nodular component. Region of maximal extent now measures roughly 18-19 mm compared to 15 mm previously. Recommend further multi disciplinary thoracic oncologic referral ([place]) for further workup and consideration of resection versus biopsy. At the size of this current  abnormality, a PET scan may also be useful for further workup.  He quit smoking 40 years ago  His brother and sister both have history of lung cancer   Patient Active Problem List   Diagnosis Date Noted   History of epistaxis 12/01/2019   Intolerance of continuous positive airway pressure (CPAP) ventilation 12/01/2019   Hypersomnia with sleep apnea 12/01/2019   Other insomnia 12/01/2019   Thoracic aortic aneurysm 05/29/2018   Bradycardia 04/16/2017   Acute appendicitis with perforation and peritoneal abscess 05/04/2014   Abscess of abdominal cavity ([place]) 05/04/2014   Atrial fibrillation ([place]) 04/28/2014   Hyperlipidemia    Sleep apnea    CAD (coronary artery disease)    Hearing loss    Diabetes mellitus ([place])     Past Medical History:  Diagnosis Date   A-fib ([place]) 04/2014   Abscess of abdominal cavity ([place]) 05/04/2014   Acute appendicitis with perforation and peritoneal abscess 05/04/2014   Allergy    CAD (coronary artery disease)    CAD (coronary artery disease) of artery bypass graft 05/04/2014   CHF (congestive heart failure) ([place])    pt not aware   Diabetes mellitus    not on medication - pt not aware of this   Dyslipidemia 05/04/2014   Hearing loss    mild   Hernia, inguinal    right   Hyperglycemia    Hypertension    Ileus, postoperative ([place]) 05/04/2014   Lumbar disc  disease    Old inferior wall myocardial infarction 2004    Past Surgical History:  Procedure Laterality Date   COLONOSCOPY     CORONARY ANGIOPLASTY     multiple   CORONARY ARTERY BYPASS GRAFT  2004   LIMA to LAD and SVG to RCA (emergent)   LAPAROSCOPIC APPENDECTOMY N/A 04/21/2014   Procedure: APPENDECTOMY LAPAROSCOPIC;  Surgeon: Rolm Bookbinder, MD;  Location: [facility];  Service: General;  Laterality: N/A;   LAPAROSCOPIC APPENDECTOMY N/A 06/24/2014   Procedure: APPENDECTOMY LAPAROSCOPIC ;  Surgeon: Rolm Bookbinder, MD;  Location: [facility];  Service: General;  Laterality: N/A;   LUMBAR LAMINECTOMY   2001   lumbar   TONSILLECTOMY      Social History   Tobacco Use   Smoking status: Former    Types: Cigarettes    Quit date: 06/24/1979    Years since quitting: 41.9   Smokeless tobacco: Former    Types: Nurse, children's Use: Never used  Substance Use Topics   Alcohol use: Yes    Alcohol/week: 14.0 standard drinks    Types: 14 Cans of beer per week    Comment: 2 drinks a day   Drug use: No    Family History  Problem Relation Age of Onset   Heart disease Father    Heart disease Brother    Hypertension Brother    Cancer Sister    Heart disease Paternal Grandmother     No Active Allergies  Medication list has been reviewed and updated.  Current Outpatient Medications on File Prior to Visit  Medication Sig Dispense Refill   aspirin (HM ASPIRIN) 81 MG chewable tablet Chew 1 tablet (81 mg total) by mouth daily. 90 tablet 3   canagliflozin (INVOKANA) 100 MG TABS tablet TAKE 1 TABLET BY MOUTH EVERY DAY BEFORE BREAKFAST 30 tablet 0   fenofibrate (TRICOR) 145 MG tablet TAKE 1 TABLET BY MOUTH DAILY 30 tablet 0   fish oil-omega-3 fatty acids 1000 MG capsule Take 2 g by mouth daily.     lisinopril (ZESTRIL) 40 MG tablet Take 1 tablet (40 mg total) by mouth daily. 90 tablet 0   metFORMIN (GLUCOPHAGE-XR) 500 MG 24 hr tablet TAKE 2 TABLETS BY MOUTH EVERY MORNING 60 tablet 0   metoprolol succinate (TOPROL-XL) 50 MG 24 hr tablet TAKE 1 TABLET BY MOUTH DAILY 30 tablet 0   Multiple Vitamin (MULTIVITAMIN ADULT PO) Take by mouth.     nitroGLYCERIN (NITROSTAT) 0.4 MG SL tablet Place 1 tablet (0.4 mg total) under the tongue every 5 (five) minutes as needed for chest pain. 25 tablet 5   rosuvastatin (CRESTOR) 10 MG tablet TAKE 1 TABLET BY MOUTH DAILY 30 tablet 0   Suvorexant (BELSOMRA) 15 MG TABS Take 1 tablet by mouth at bedtime as needed. 30 tablet 5   No current facility-administered medications on file prior to visit.    Review of Systems:  As per HPI- otherwise  negative.   Physical Examination: Vitals:   05/30/21 1411  BP: 116/72  Pulse: 61  Resp: 18  Temp: 97.6 F (36.4 C)  SpO2: 97%   Vitals:   05/30/21 1411  Weight: 235 lb 6.4 oz (106.8 kg)  Height: 5\' 11"  (1.803 m)   Body mass index is 32.83 kg/m. Ideal Body Weight: Weight in (lb) to have BMI = 25: 178.9  GEN: no acute distress.  Obese, looks well  HEENT: Atraumatic, Normocephalic.  Ears and Nose: No external deformity. CV: RRR, No  M/G/R. No JVD. No thrill. No extra heart sounds. PULM: CTA B, no wheezes, crackles, rhonchi. No retractions. No resp. distress. No accessory muscle use. ABD: S, NT, ND. No rebound. No HSM. EXTR: No c/c/e PSYCH: Normally interactive. Conversant.    Assessment and Plan: Mixed hyperlipidemia - Plan: Lipid panel  Type 2 diabetes mellitus without complication, without long-term current use of insulin (HCC) - Plan: Comprehensive metabolic panel, Hemoglobin A1c  Essential hypertension, benign - Plan: CBC, Comprehensive metabolic panel  Coronary artery disease involving coronary bypass graft of native heart with unstable angina pectoris (HCC)  Sleep apnea, unspecified type  Screening for prostate cancer - Plan: PSA, Medicare ( [place] only)  Mild anemia - Plan: CBC  Pain of left clavicle - Plan: DG Clavicle Left  Need for influenza vaccination - Plan: Flu Vaccine QUAD High Dose(Fluad)   Physical exam today  Labs are pending as above Will obtain plain film of left clavicle as patient injured it recently  Patient was noted to have a concerning lung mass on recent CT for aneurysm follow-up.  I will touch base with oncology about referrals and next steps   Addendum 10/25, Dr. Marin Olp kindly advised me to have this patient seen by pulmonology.  We will place urgent referral and update patient Signed Lamar Blinks, MD  Received clavicle x-ray report and sent message to pt  DG Clavicle Left  Result Date: 05/30/2021 CLINICAL DATA:   Post clavicular injury 1 month or so ago, heard a pop in the LEFT clavicle still with pain. EXAM: LEFT CLAVICLE - 2+ VIEWS COMPARISON:  Shoulder evaluation from May of 2016 of the LEFT shoulder. FINDINGS: Mild acromioclavicular and moderate glenohumeral osteoarthritic changes. Clavicle is intact. Sternotomy changes are partially visualized and incidentally noted. IMPRESSION: Mild acromioclavicular and moderate glenohumeral osteoarthritic changes. No acute findings about the clavicle. Electronically Signed   By: Zetta Bills M.D.   On: 05/30/2021 16:14    Addendum 10/25, received labs as below.  Message to patient  Results for orders placed or performed in visit on 05/30/21  CBC  Result Value Ref Range   WBC 5.4 4.0 - 10.5 K/uL   RBC 4.41 4.22 - 5.81 Mil/uL   Platelets 198.0 150.0 - 400.0 K/uL   Hemoglobin 13.9 13.0 - 17.0 g/dL   HCT 41.9 39.0 - 52.0 %   MCV 95.0 78.0 - 100.0 fl   MCHC 33.2 30.0 - 36.0 g/dL   RDW 13.1 11.5 - 15.5 %  Comprehensive metabolic panel  Result Value Ref Range   Sodium 139 135 - 145 mEq/L   Potassium 4.0 3.5 - 5.1 mEq/L   Chloride 106 96 - 112 mEq/L   CO2 25 19 - 32 mEq/L   Glucose, Bld 190 (H) 70 - 99 mg/dL   BUN 28 (H) 6 - 23 mg/dL   Creatinine, Ser 1.17 0.40 - 1.50 mg/dL   Total Bilirubin 0.5 0.2 - 1.2 mg/dL   Alkaline Phosphatase 61 39 - 117 U/L   AST 23 0 - 37 U/L   ALT 22 0 - 53 U/L   Total Protein 6.7 6.0 - 8.3 g/dL   Albumin 4.3 3.5 - 5.2 g/dL   GFR 60.90 >60.00 mL/min   Calcium 10.1 8.4 - 10.5 mg/dL  Hemoglobin A1c  Result Value Ref Range   Hgb A1c MFr Bld 7.3 (H) 4.6 - 6.5 %  Lipid panel  Result Value Ref Range   Cholesterol 114 0 - 200 mg/dL   Triglycerides 176.0 (H)  0.0 - 149.0 mg/dL   HDL 37.80 (L) >39.00 mg/dL   VLDL 35.2 0.0 - 40.0 mg/dL   LDL Cholesterol 41 0 - 99 mg/dL   Total CHOL/HDL Ratio 3    NonHDL 76.02   PSA, Medicare ( [place] only)  Result Value Ref Range   PSA 4.14 (H) 0.10 - 4.00 ng/ml

## 2021-05-30 ENCOUNTER — Ambulatory Visit
Admission: RE | Admit: 2021-05-30 | Discharge: 2021-05-30 | Disposition: A | Discharge status: Home / Self Care | Payer: Medicare Other | Source: Ambulatory Visit | Attending: Family Medicine | Admitting: Family Medicine

## 2021-05-30 ENCOUNTER — Other Ambulatory Visit: Payer: Self-pay

## 2021-05-30 ENCOUNTER — Encounter: Payer: Self-pay | Admitting: Family Medicine

## 2021-05-30 ENCOUNTER — Ambulatory Visit: Payer: Medicare Other

## 2021-05-30 ENCOUNTER — Ambulatory Visit (INDEPENDENT_AMBULATORY_CARE_PROVIDER_SITE_OTHER): Payer: Medicare Other | Admitting: Family Medicine

## 2021-05-30 VITALS — BP 116/72 | HR 61 | Temp 97.6°F | Resp 18 | Ht 71.0 in | Wt 235.4 lb

## 2021-05-30 DIAGNOSIS — G4730 Sleep apnea, unspecified: Secondary | ICD-10-CM

## 2021-05-30 DIAGNOSIS — E119 Type 2 diabetes mellitus without complications: Secondary | ICD-10-CM

## 2021-05-30 DIAGNOSIS — I25700 Atherosclerosis of coronary artery bypass graft(s), unspecified, with unstable angina pectoris: Secondary | ICD-10-CM | POA: Diagnosis not present

## 2021-05-30 DIAGNOSIS — Z23 Encounter for immunization: Secondary | ICD-10-CM

## 2021-05-30 DIAGNOSIS — R918 Other nonspecific abnormal finding of lung field: Secondary | ICD-10-CM | POA: Diagnosis not present

## 2021-05-30 DIAGNOSIS — D649 Anemia, unspecified: Secondary | ICD-10-CM | POA: Diagnosis not present

## 2021-05-30 DIAGNOSIS — I10 Essential (primary) hypertension: Secondary | ICD-10-CM | POA: Diagnosis not present

## 2021-05-30 DIAGNOSIS — M898X1 Other specified disorders of bone, shoulder: Secondary | ICD-10-CM | POA: Diagnosis not present

## 2021-05-30 DIAGNOSIS — Z125 Encounter for screening for malignant neoplasm of prostate: Secondary | ICD-10-CM

## 2021-05-30 DIAGNOSIS — E782 Mixed hyperlipidemia: Secondary | ICD-10-CM

## 2021-05-30 DIAGNOSIS — M19012 Primary osteoarthritis, left shoulder: Secondary | ICD-10-CM | POA: Diagnosis not present

## 2021-05-30 NOTE — Progress Notes (Signed)
   Covid-19 Vaccination Clinic  Name:  Perry [name]    MRN: [id number] DOB: 12/29/1945  05/30/2021  [name] was observed post Covid-19 immunization for 15 minutes without incident. He was provided with Vaccine Information Sheet and instruction to access the V-Safe system.   [name] was instructed to call 911 with any severe reactions post vaccine: Difficulty breathing  Swelling of face and throat  A fast heartbeat  A bad rash all over body  Dizziness and weakness   Immunizations Administered     Name Date Dose VIS Date Route   Pfizer Covid-19 Vaccine Bivalent Booster 05/30/2021  2:54 PM 0.3 mL 04/06/2021 Intramuscular   Manufacturer: [place]   Lot: [id number]   [place]: [phone]

## 2021-05-31 ENCOUNTER — Encounter: Payer: Self-pay | Admitting: Family Medicine

## 2021-05-31 LAB — COMPREHENSIVE METABOLIC PANEL
ALT: 22 U/L (ref 0–53)
AST: 23 U/L (ref 0–37)
Albumin: 4.3 g/dL (ref 3.5–5.2)
Alkaline Phosphatase: 61 U/L (ref 39–117)
BUN: 28 mg/dL — ABNORMAL HIGH (ref 6–23)
CO2: 25 mEq/L (ref 19–32)
Calcium: 10.1 mg/dL (ref 8.4–10.5)
Chloride: 106 mEq/L (ref 96–112)
Creatinine, Ser: 1.17 mg/dL (ref 0.40–1.50)
GFR: 60.9 mL/min (ref >60.00)
Glucose, Bld: 190 mg/dL — ABNORMAL HIGH (ref 70–99)
Potassium: 4 mEq/L (ref 3.5–5.1)
Sodium: 139 mEq/L (ref 135–145)
Total Bilirubin: 0.5 mg/dL (ref 0.2–1.2)
Total Protein: 6.7 g/dL (ref 6.0–8.3)

## 2021-05-31 LAB — LIPID PANEL
Cholesterol: 114 mg/dL (ref 0–200)
HDL: 37.8 mg/dL — ABNORMAL LOW (ref >39.00)
LDL Cholesterol: 41 mg/dL (ref 0–99)
NonHDL: 76.02
Total CHOL/HDL Ratio: 3
Triglycerides: 176 mg/dL — ABNORMAL HIGH (ref 0.0–149.0)
VLDL: 35.2 mg/dL (ref 0.0–40.0)

## 2021-05-31 LAB — CBC
HCT: 41.9 % (ref 39.0–52.0)
Hemoglobin: 13.9 g/dL (ref 13.0–17.0)
MCHC: 33.2 g/dL (ref 30.0–36.0)
MCV: 95 fl (ref 78.0–100.0)
Platelets: 198 10*3/uL (ref 150.0–400.0)
RBC: 4.41 Mil/uL (ref 4.22–5.81)
RDW: 13.1 % (ref 11.5–15.5)
WBC: 5.4 10*3/uL (ref 4.0–10.5)

## 2021-05-31 LAB — PSA, MEDICARE: PSA: 4.14 ng/ml — ABNORMAL HIGH (ref 0.10–4.00)

## 2021-05-31 LAB — HEMOGLOBIN A1C: Hgb A1c MFr Bld: 7.3 % — ABNORMAL HIGH (ref 4.6–6.5)

## 2021-06-01 ENCOUNTER — Other Ambulatory Visit: Payer: Self-pay | Admitting: Family Medicine

## 2021-06-01 DIAGNOSIS — I25700 Atherosclerosis of coronary artery bypass graft(s), unspecified, with unstable angina pectoris: Secondary | ICD-10-CM

## 2021-06-01 DIAGNOSIS — E782 Mixed hyperlipidemia: Secondary | ICD-10-CM

## 2021-06-01 DIAGNOSIS — E119 Type 2 diabetes mellitus without complications: Secondary | ICD-10-CM

## 2021-06-09 ENCOUNTER — Ambulatory Visit (INDEPENDENT_AMBULATORY_CARE_PROVIDER_SITE_OTHER): Payer: Medicare Other | Admitting: Emergency Medicine

## 2021-06-09 ENCOUNTER — Other Ambulatory Visit: Payer: Self-pay

## 2021-06-09 ENCOUNTER — Encounter: Payer: Self-pay | Admitting: Emergency Medicine

## 2021-06-09 DIAGNOSIS — R911 Solitary pulmonary nodule: Secondary | ICD-10-CM

## 2021-06-09 DIAGNOSIS — I25700 Atherosclerosis of coronary artery bypass graft(s), unspecified, with unstable angina pectoris: Secondary | ICD-10-CM

## 2021-06-09 NOTE — Assessment & Plan Note (Signed)
Groundglass poorly formed right upper lobe pulmonary nodule that is slowly growing, present on scan going back to 2018.  Suspect that this represents highly differentiated adenocarcinoma.  He has great functional capacity and I suspect he might be a surgical candidate despite his comorbidities.  He is going to think about whether he would want surgery.  In the meantime I will check pulmonary function testing, PET scan to see if he is a good candidate.  If we do not think surgery is possible or if he elects to forego this then we will arrange for bronchoscopy to get a tissue diagnosis and then move towards SBRT.

## 2021-06-09 NOTE — Patient Instructions (Signed)
We will arrange for pulmonary function testing. We will arrange for a PET scan to evaluate your right upper lobe pulmonary nodule Follow Dr. Lamonte Sakai next available after your testing so we can review together.  We will discuss next steps of the work-up at that time.

## 2021-06-09 NOTE — Progress Notes (Signed)
Subjective:    Patient ID: Perry [name], male    DOB: 03-Sep-1945, 75 y.o.   MRN: [id number]  HPI 75 year old former smoker (15 pack years) with a history of hypertension, CAD/CABG, atrial fibrillation, diabetes.  He is being followed for thoracic aortic aneurysm by serial CT scans.  He is here regarding groundglass opacity noted on his CTs. He has some stable exertional SOB when he carries heavy objects. He works on a farm. Quite active.  Denies any coughing, wheezing.  No chest pain.  No sputum.  CT angio chest performed 05/19/2021 reviewed by me showed a stable thoracic aortic aneurysm, no mediastinal or hilar adenopathy, a slowly enlarging irregularly shaped posterior right upper lobe groundglass opacity without clear borders.  Approximately 19 mm in largest dimension increased from 15 mm.  In retrospect can be seen going back to high-resolution CT chest 04/16/2017.   Review of Systems As per HPI  Past Medical History:  Diagnosis Date   A-fib ([place]) 04/2014   Abscess of abdominal cavity ([place]) 05/04/2014   Acute appendicitis with perforation and peritoneal abscess 05/04/2014   Allergy    CAD (coronary artery disease)    CAD (coronary artery disease) of artery bypass graft 05/04/2014   CHF (congestive heart failure) ([place])    pt not aware   Diabetes mellitus    not on medication - pt not aware of this   Dyslipidemia 05/04/2014   Hearing loss    mild   Hernia, inguinal    right   Hyperglycemia    Hypertension    Ileus, postoperative ([place]) 05/04/2014   Lumbar disc disease    Old inferior wall myocardial infarction 2004     Family History  Problem Relation Age of Onset   Heart disease Father    Heart disease Brother    Hypertension Brother    Cancer Sister    Heart disease Paternal Grandmother     Sister and brother both have lung cancer.    Social History   Socioeconomic History   Marital status: Married    Spouse name: [name]   Number of children: 2   Years of  education: Masters   Highest education level: Not on file  Occupational History   Occupation: Clinical biochemist  Tobacco Use   Smoking status: Former    Types: Cigarettes    Quit date: 06/24/1979    Years since quitting: 41.9   Smokeless tobacco: Former    Types: Nurse, children's Use: Never used  Substance and Sexual Activity   Alcohol use: Yes    Alcohol/week: 14.0 standard drinks    Types: 14 Cans of beer per week    Comment: 2 drinks a day   Drug use: No   Sexual activity: Yes  Other Topics Concern   Not on file  Social History Narrative   Patient is married [name]) and lives at home with his wife.   Patient has two adult children.   Patient is working full-time.   Patient has a Master's degree.   Patient drinks one cup of coffee every morning.   Patient is right-handed.   Social Determinants of Health   Financial Resource Strain: Not on file  Food Insecurity: Not on file  Transportation Needs: Not on file  Physical Activity: Not on file  Stress: Not on file  Social Connections: Not on file  Intimate Partner Violence: Not on file      No Active Allergies   Outpatient  Medications Prior to Visit  Medication Sig Dispense Refill   aspirin (HM ASPIRIN) 81 MG chewable tablet Chew 1 tablet (81 mg total) by mouth daily. 90 tablet 3   canagliflozin (INVOKANA) 100 MG TABS tablet TAKE 1 TABLET BY MOUTH EVERY DAY BEFORE BREAKFAST 30 tablet 0   fenofibrate (TRICOR) 145 MG tablet TAKE 1 TABLET BY MOUTH DAILY 30 tablet 0   fish oil-omega-3 fatty acids 1000 MG capsule Take 2 g by mouth daily.     lisinopril (ZESTRIL) 40 MG tablet Take 1 tablet (40 mg total) by mouth daily. 90 tablet 0   metFORMIN (GLUCOPHAGE-XR) 500 MG 24 hr tablet TAKE 2 TABLETS BY MOUTH EVERY MORNING 60 tablet 0   metoprolol succinate (TOPROL-XL) 50 MG 24 hr tablet TAKE 1 TABLET BY MOUTH DAILY 30 tablet 0   Multiple Vitamin (MULTIVITAMIN ADULT PO) Take by mouth.     nitroGLYCERIN (NITROSTAT) 0.4 MG  SL tablet Place 1 tablet (0.4 mg total) under the tongue every 5 (five) minutes as needed for chest pain. 25 tablet 5   rosuvastatin (CRESTOR) 10 MG tablet TAKE 1 TABLET BY MOUTH DAILY 30 tablet 0   Suvorexant (BELSOMRA) 15 MG TABS Take 1 tablet by mouth at bedtime as needed. 30 tablet 5   No facility-administered medications prior to visit.         Objective:   Physical Exam Vitals:   06/09/21 1538  BP: 116/64  Pulse: 60  Temp: 97.9 F (36.6 C)  TempSrc: Oral  SpO2: 98%  Weight: 236 lb (107 kg)  Height: 6' (1.829 m)   Gen: Pleasant, well-nourished, in no distress,  normal affect  ENT: No lesions,  mouth clear,  oropharynx clear, no postnasal drip  Neck: No JVD, no stridor  Lungs: No use of accessory muscles, no crackles or wheezing on normal respiration, no wheeze on forced expiration  Cardiovascular: RRR, heart sounds normal, no murmur or gallops, no peripheral edema  Musculoskeletal: No deformities, no cyanosis or clubbing  Neuro: alert, awake, non focal  Skin: Warm, no lesions or rash      Assessment & Plan:   Pulmonary nodule Groundglass poorly formed right upper lobe pulmonary nodule that is slowly growing, present on scan going back to 2018.  Suspect that this represents highly differentiated adenocarcinoma.  He has great functional capacity and I suspect he might be a surgical candidate despite his comorbidities.  He is going to think about whether he would want surgery.  In the meantime I will check pulmonary function testing, PET scan to see if he is a good candidate.  If we do not think surgery is possible or if he elects to forego this then we will arrange for bronchoscopy to get a tissue diagnosis and then move towards SBRT.   Baltazar Apo, MD, PhD 06/09/2021, 4:18 PM [facility] [phone] or if no answer before 7:00PM call [phone] For any issues after 7:00PM please call eLink [phone]

## 2021-06-09 NOTE — Addendum Note (Signed)
Addended by: Fran Lowes on: 06/09/2021 04:21 PM   Modules accepted: Orders

## 2021-06-14 NOTE — Telephone Encounter (Signed)
I'm ok moving him up to be seen sooner to review the PET scan

## 2021-06-14 NOTE — Telephone Encounter (Signed)
RB please advise. Thanks.  

## 2021-06-15 NOTE — Telephone Encounter (Signed)
Spoke with pt and scheduled for OV on Nov. 18. Nothing further needed at this time.

## 2021-06-16 DIAGNOSIS — H53142 Visual discomfort, left eye: Secondary | ICD-10-CM | POA: Diagnosis not present

## 2021-06-16 DIAGNOSIS — H31092 Other chorioretinal scars, left eye: Secondary | ICD-10-CM | POA: Diagnosis not present

## 2021-06-16 DIAGNOSIS — D3132 Benign neoplasm of left choroid: Secondary | ICD-10-CM | POA: Diagnosis not present

## 2021-06-16 DIAGNOSIS — H43812 Vitreous degeneration, left eye: Secondary | ICD-10-CM | POA: Diagnosis not present

## 2021-06-16 DIAGNOSIS — H25813 Combined forms of age-related cataract, bilateral: Secondary | ICD-10-CM | POA: Diagnosis not present

## 2021-06-22 ENCOUNTER — Other Ambulatory Visit: Payer: Self-pay | Admitting: Family Medicine

## 2021-06-22 NOTE — Progress Notes (Signed)
Cardiology Office Note    Date:  07/06/2021   ID:  Perry [name], DOB Dec 31, 1945, MRN [id number]   PCP:  Darreld Mclean, MD   [place]  Cardiologist:  Sinclair Grooms, MD   Advanced Practice Provider:  No care team member to display Electrophysiologist:  None   [id number]   No chief complaint on file.   History of Present Illness:  Perry [name] is a 75 y.o. male with a hx of asthma, hyperlipidemia, PAF(during appendix surgery not anticoagulate), coronary artery disease, status post coronary artery bypass graft 2004, thoracic aortic aneurysm, hypertension, diabetes II, obstructive sleep apnea, and dyslipidemia.   Patient last saw Dr. Tamala Julian 08/20/2020 and was doing well.  CTA 05/19/2021 stable mild aneurysm of the ascending thoracic aorta 4.1 cm.  Patient comes in for regular f/u. 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 No chest tightness or pressure, dizziness, palpitations, edema. 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 Found to have lung cancer on CTA and PET scan and has to have surgery. Hasn't met surgeon yet.  Past Medical History:  Diagnosis Date   A-fib ([place]) 04/2014   Abscess of abdominal cavity ([place]) 05/04/2014   Acute appendicitis with perforation and peritoneal abscess 05/04/2014   Allergy    CAD (coronary artery disease)    CAD (coronary artery disease) of artery bypass graft 05/04/2014   CHF (congestive heart failure) ([place])    pt not aware   Diabetes mellitus    not on medication - pt not aware of this   Dyslipidemia 05/04/2014   Hearing loss    mild   Hernia, inguinal    right   Hyperglycemia    Hypertension    Ileus, postoperative ([place]) 05/04/2014   Lumbar disc disease    Old inferior wall myocardial infarction 2004    Past Surgical History:  Procedure Laterality Date   COLONOSCOPY     CORONARY ANGIOPLASTY     multiple   CORONARY ARTERY BYPASS GRAFT  2004   LIMA to LAD  and SVG to RCA (emergent)   LAPAROSCOPIC APPENDECTOMY N/A 04/21/2014   Procedure: APPENDECTOMY LAPAROSCOPIC;  Surgeon: Rolm Bookbinder, MD;  Location: [facility];  Service: General;  Laterality: N/A;   LAPAROSCOPIC APPENDECTOMY N/A 06/24/2014   Procedure: APPENDECTOMY LAPAROSCOPIC ;  Surgeon: Rolm Bookbinder, MD;  Location: [facility];  Service: General;  Laterality: N/A;   LUMBAR LAMINECTOMY  2001   lumbar   TONSILLECTOMY      Current Medications: Current Meds  Medication Sig   aspirin (HM ASPIRIN) 81 MG chewable tablet Chew 1 tablet (81 mg total) by mouth daily.   BELSOMRA 15 MG TABS TAKE 1 TABLET BY MOUTH AT BEDTIME AS NEEDED   canagliflozin (INVOKANA) 100 MG TABS tablet TAKE 1 TABLET BY MOUTH EVERY DAY BEFORE BREAKFAST   COVID-19 mRNA bivalent vaccine, Pfizer, (PFIZER COVID-19 VAC BIVALENT) injection Inject into the muscle.   fenofibrate (TRICOR) 145 MG tablet TAKE 1 TABLET BY MOUTH DAILY   fish oil-omega-3 fatty acids 1000 MG capsule Take 2 g by mouth daily.   lisinopril (ZESTRIL) 40 MG tablet Take 1 tablet (40 mg total) by mouth daily.   metFORMIN (GLUCOPHAGE-XR) 500 MG 24 hr tablet TAKE 2 TABLETS BY MOUTH EVERY MORNING   metoprolol succinate (TOPROL-XL) 50 MG 24 hr tablet Take 1 tablet (50 mg total) by mouth daily. Take with or immediately following a  meal   Multiple Vitamin (MULTIVITAMIN ADULT PO) Take by mouth.   nitroGLYCERIN (NITROSTAT) 0.4 MG SL tablet Place 1 tablet (0.4 mg total) under the tongue every 5 (five) minutes as needed for chest pain.   rosuvastatin (CRESTOR) 10 MG tablet TAKE 1 TABLET BY MOUTH DAILY     Allergies:   Patient has no known allergies.   Social History   Socioeconomic History   Marital status: Married    Spouse name: Perry [name]   Number of children: 2   Years of education: Masters   Highest education level: Not on file  Occupational History   Occupation: Clinical biochemist  Tobacco Use   Smoking status: Former    Types: Cigarettes    Quit date:  06/24/1979    Years since quitting: 42.0   Smokeless tobacco: Former    Types: Nurse, children's Use: Never used  Substance and Sexual Activity   Alcohol use: Yes    Alcohol/week: 14.0 standard drinks    Types: 14 Cans of beer per week    Comment: 2 drinks a day   Drug use: No   Sexual activity: Yes  Other Topics Concern   Not on file  Social History Narrative   Patient is married Perry [name]) and lives at home with his wife.   Patient has two adult children.   Patient is working full-time.   Patient has a Master's degree.   Patient drinks one cup of coffee every morning.   Patient is right-handed.   Social Determinants of Health   Financial Resource Strain: Not on file  Food Insecurity: Not on file  Transportation Needs: Not on file  Physical Activity: Not on file  Stress: Not on file  Social Connections: Not on file     Family History:  The patient's  family history includes Cancer in his sister; Heart disease in his brother, father, and paternal grandmother; Hypertension in his brother.   ROS:   Please see the history of present illness.    ROS All other systems reviewed and are negative.   PHYSICAL EXAM:   VS:  BP 138/72   Pulse (!) 57   Ht 6' (1.829 m)   Wt 238 lb 3.2 oz (108 kg)   SpO2 96%   BMI 32.31 kg/m   Physical Exam  GEN: Obese, in no acute distress  Neck: Right carotid bruit, no JVD, or masses Cardiac:RRR; S4, no murmurs, rubs, or gallops  Respiratory:  clear to auscultation bilaterally, normal work of breathing GI: soft, nontender, nondistended, + BS Ext: without cyanosis, clubbing, or edema, Good distal pulses bilaterally Neuro:  Alert and Oriented x 3,  Psych: euthymic mood, full affect  Wt Readings from Last 3 Encounters:  07/06/21 238 lb 3.2 oz (108 kg)  06/24/21 236 lb 3.2 oz (107.1 kg)  06/09/21 236 lb (107 kg)      Studies/Labs Reviewed:   EKG:  EKG is  ordered today.  The ekg ordered today demonstrates sinus bradycardia with  first-degree AV block and inferior infarct unchanged from prior tracing  Recent Labs: 05/30/2021: ALT 22; BUN 28; Creatinine, Ser 1.17; Hemoglobin 13.9; Platelets 198.0; Potassium 4.0; Sodium 139   Lipid Panel    Component Value Date/Time   CHOL 114 05/30/2021 1529   CHOL 130 08/20/2020 0950   TRIG 176.0 (H) 05/30/2021 1529   HDL 37.80 (L) 05/30/2021 1529   HDL 40 08/20/2020 0950   CHOLHDL 3 05/30/2021 1529   VLDL 35.2 05/30/2021  [place] 05/30/2021 1529   LDLCALC 71 08/20/2020 0950   LDLCALC 54 04/21/2020 0843    Additional studies/ records that were reviewed today include:  Chest CTA 05/19/21 IMPRESSION: 1. Stable mild aneurysmal disease of the ascending thoracic aorta measuring up to 4.1 cm in estimated maximal diameter. Recommend annual imaging followup by CTA or MRA. This recommendation follows 2010 ACCF/AHA/AATS/ACR/ASA/SCA/SCAI/SIR/STS/SVM Guidelines for the Diagnosis and Management of Patients with Thoracic Aortic Disease. Circulation. 2010; 121: J884-Z660. Aortic aneurysm NOS (ICD10-I71.9) 2. Enlarging ground-glass opacity in the posterior right upper lobe which remains difficult to measure and does not have a definitive solid nodular component. Region of maximal extent now measures roughly 18-19 mm compared to 15 mm previously. Recommend further multi disciplinary thoracic oncologic referral ([place]) for further workup and consideration of resection versus biopsy. At the size of this current abnormality, a PET scan may also be useful for further workup. 3. Cholelithiasis.   Aortic aneurysm NOS (ICD10-I71.9).     Electronically Signed   By: Aletta Edouard M.D.   On: 05/20/2021 15:52   Risk Assessment/Calculations:              ASSESSMENT:    1. Coronary artery disease involving coronary bypass graft of native heart without angina pectoris   2. Dyspnea on exertion   3. Preoperative clearance   4. Paroxysmal atrial fibrillation (HCC)   5.  Thoracic aortic aneurysm without rupture, unspecified part   6. Essential hypertension   7. OSA (obstructive sleep apnea)   8. Controlled type 2 diabetes mellitus with other circulatory complication, without long-term current use of insulin ([place])   9. Mixed hyperlipidemia   10. Right carotid bruit      PLAN:  In order of problems listed above:  CAD CABG 2004, NST 04/2019 EF 47% with inferior wall hypokinesis medium defect of moderate severity in the inferior distribution consistent with prior inferior MI no ischemia, intermediate risk study.  2D echo 04/17/2017 normal LVEF 50 to 55%.  Patient without angina but chronic dyspnea on exertion that is unchanged.  On aspirin, Toprol, Crestor  Preoperative clearance for lung cancer surgical date and surgeon undetermined.  Patient has chronic dyspnea on exertion that is unchanged and fatigue at the end of the day.  He denies any chest pain or angina.  NST 2018 with out ischemia.  With upcoming surgery we will update Lexiscan Myoview prior to surgery. According to the Revised Cardiac Risk Index (RCRI), his Perioperative Risk of Major Cardiac Event is (%): 6.6  His Functional Capacity in METs is: 7.59 according to the [facility] Activity Status Index (DASI).   PAF occurred once in the setting of appendix surgery.  No recurrence not on anticoagulation.  Thoracic aortic aneurysm 4.1 cm on CTA 05/2021  HTN controlled  OSA-previous diagnosis but then told he doesn't have it.  DM2 with neuropathy  HLD on Crestor and Tricor.  LDL 41-10/24/22  Right carotid bruit check carotid Dopplers  Shared Decision Making/Informed Consent   Shared Decision Making/Informed Consent The risks [chest pain, shortness of breath, cardiac arrhythmias, dizziness, blood pressure fluctuations, myocardial infarction, stroke/transient ischemic attack, nausea, vomiting, allergic reaction, radiation exposure, metallic taste sensation and life-threatening complications (estimated to  be 1 in 10,000)], benefits (risk stratification, diagnosing coronary artery disease, treatment guidance) and alternatives of a nuclear stress test were discussed in detail with [name] and he agrees to proceed.    Medication Adjustments/Labs and Tests Ordered: Current medicines are reviewed at length  with the patient today.  Concerns regarding medicines are outlined above.  Medication changes, Labs and Tests ordered today are listed in the Patient Instructions below. Patient Instructions  Medication Instructions:  Your physician recommends that you continue on your current medications as directed. Please refer to the Current Medication list given to you today.  *If you need a refill on your cardiac medications before your next appointment, please call your pharmacy*   Lab Work: None If you have labs (blood work) drawn today and your tests are completely normal, you will receive your results only by: [place] (if you have MyChart) OR A paper copy in the mail If you have any lab test that is abnormal or we need to change your treatment, we will call you to review the results.   Testing/Procedures: Your physician has requested that you have a lexiscan myoview. For further information please visit [email or web address]. Please follow instruction sheet, as given.  Your physician has requested that you have a carotid duplex. This test is an ultrasound of the carotid arteries in your neck. It looks at blood flow through these arteries that supply the brain with blood. Allow one hour for this exam. There are no restrictions or special instructions.   Follow-Up: At [facility], you and your health needs are our priority.  As part of our continuing mission to provide you with exceptional heart care, we have created designated Provider Care Teams.  These Care Teams include your primary Cardiologist (physician) and Advanced Practice Providers (APPs -  Physician Assistants and Nurse  Practitioners) who all work together to provide you with the care you need, when you need it.   Your next appointment:   As scheduled   Signed, Ermalinda Barrios, PA-C  07/06/2021 9:30 AM    [facility] [address], [place], [place]  [address] Phone: [phone]; Fax: [phone]

## 2021-06-23 ENCOUNTER — Ambulatory Visit (HOSPITAL_COMMUNITY)
Admission: RE | Admit: 2021-06-23 | Discharge: 2021-06-23 | Disposition: A | Discharge status: Home / Self Care | Payer: Medicare Other | Source: Ambulatory Visit | Attending: Emergency Medicine | Admitting: Emergency Medicine

## 2021-06-23 DIAGNOSIS — R911 Solitary pulmonary nodule: Secondary | ICD-10-CM | POA: Diagnosis not present

## 2021-06-23 DIAGNOSIS — K8020 Calculus of gallbladder without cholecystitis without obstruction: Secondary | ICD-10-CM | POA: Diagnosis not present

## 2021-06-23 DIAGNOSIS — N281 Cyst of kidney, acquired: Secondary | ICD-10-CM | POA: Diagnosis not present

## 2021-06-23 DIAGNOSIS — K449 Diaphragmatic hernia without obstruction or gangrene: Secondary | ICD-10-CM | POA: Diagnosis not present

## 2021-06-23 DIAGNOSIS — I2510 Atherosclerotic heart disease of native coronary artery without angina pectoris: Secondary | ICD-10-CM | POA: Diagnosis not present

## 2021-06-23 LAB — GLUCOSE, CAPILLARY: Glucose-Capillary: 157 mg/dL — ABNORMAL HIGH (ref 70–99)

## 2021-06-23 MED ORDER — FLUDEOXYGLUCOSE F - 18 (FDG) INJECTION
11.8000 | Freq: Once | INTRAVENOUS | Status: AC
  Administered 2021-06-23: 08:00:00 11.84 via INTRAVENOUS

## 2021-06-24 ENCOUNTER — Other Ambulatory Visit: Payer: Self-pay

## 2021-06-24 ENCOUNTER — Ambulatory Visit (INDEPENDENT_AMBULATORY_CARE_PROVIDER_SITE_OTHER): Payer: Medicare Other | Admitting: Emergency Medicine

## 2021-06-24 ENCOUNTER — Encounter: Payer: Self-pay | Admitting: Emergency Medicine

## 2021-06-24 ENCOUNTER — Other Ambulatory Visit (HOSPITAL_BASED_OUTPATIENT_CLINIC_OR_DEPARTMENT_OTHER): Payer: Self-pay

## 2021-06-24 DIAGNOSIS — I25700 Atherosclerosis of coronary artery bypass graft(s), unspecified, with unstable angina pectoris: Secondary | ICD-10-CM

## 2021-06-24 DIAGNOSIS — R911 Solitary pulmonary nodule: Secondary | ICD-10-CM | POA: Diagnosis not present

## 2021-06-24 DIAGNOSIS — Z23 Encounter for immunization: Secondary | ICD-10-CM | POA: Diagnosis not present

## 2021-06-24 MED ORDER — PFIZER COVID-19 VAC BIVALENT 30 MCG/0.3ML IM SUSP
INTRAMUSCULAR | 0 refills | Status: DC
  Filled 2021-06-24: qty 0.3, 1d supply, fill #0

## 2021-06-24 NOTE — Patient Instructions (Addendum)
We reviewed your PET scan today. Believe that we should strongly consider referral to cardiothoracic surgery to discuss possible resection of your pulmonary nodule.  If you decide you would like to do so here locally then I would refer you to [facility] in [place], either Dr. Roxan Hockey or Dr. Kipp Brood.  Please think about this and let us know if you would like to go ahead with this referral. We will arrange for pulmonary function testing to assess your lung function and your lung capacity. Follow with Dr Lamonte Sakai in late February to determine next steps if we have not already decided to pursue surgical referral.

## 2021-06-24 NOTE — Addendum Note (Signed)
Addended by: Gavin Potters R on: 06/24/2021 01:49 PM   Modules accepted: Orders

## 2021-06-24 NOTE — Assessment & Plan Note (Signed)
PET scan was reassuring without any significant nodal hypermetabolism or any evidence of mediastinal or distant disease.  The clinical picture is consistent with a slow-growing adenocarcinoma.  I think he would be a good candidate for surgical resection.  He is still thinking about it, says that he will probably agree but wants to talk to some of his friends who are surgeons, his other physicians and also to research our local physicians.  I provided him with the names of the excellent thoracic surgeons to whom I would refer him.  In the meantime we will get pulmonary function testing.  If he decides not to pursue surgery then I would offer him navigational bronchoscopy to facilitate radiation therapy.

## 2021-06-24 NOTE — Progress Notes (Signed)
Subjective:    Patient ID: Perry [name], male    DOB: November 25, 1945, 75 y.o.   MRN: [id number]  HPI 75 year old former smoker (15 pack years) with a history of hypertension, CAD/CABG, atrial fibrillation, diabetes.  He is being followed for thoracic aortic aneurysm by serial CT scans.  He is here regarding groundglass opacity noted on his CTs. He has some stable exertional SOB when he carries heavy objects. He works on a farm. Quite active.  Denies any coughing, wheezing.  No chest pain.  No sputum.  CT angio chest performed 05/19/2021 reviewed by me showed a stable thoracic aortic aneurysm, no mediastinal or hilar adenopathy, a slowly enlarging irregularly shaped posterior right upper lobe groundglass opacity without clear borders.  Approximately 19 mm in largest dimension increased from 15 mm.  In retrospect can be seen going back to high-resolution CT chest 04/16/2017.   ROV 06/24/21 --follow-up visit for 75 year old gentleman with a history of tobacco use, CAD CABG, hypertension, A. fib, diabetes, TAA.  He has serial CT scans to follow his aortic aneurysm and there was a groundglass opacity noted on his recent scan.  Appears to be growing when looking back to 2018.  Pulmonary function testing not yet done. He is thinking about options - is interested in possible surgery but wants to speak to some of his friends who are surgeons about his options.   PET scan from 06/23/2021 reviewed by me, shows mild FDG uptake, below the mediastinal blood pool, in his small groundglass pulmonary nodule, unchanged in size or appearance.  No evidence of other chest findings, distant disease.    Review of Systems As per HPI  Past Medical History:  Diagnosis Date   A-fib ([place]) 04/2014   Abscess of abdominal cavity ([place]) 05/04/2014   Acute appendicitis with perforation and peritoneal abscess 05/04/2014   Allergy    CAD (coronary artery disease)    CAD (coronary artery disease) of artery bypass graft  05/04/2014   CHF (congestive heart failure) ([place])    pt not aware   Diabetes mellitus    not on medication - pt not aware of this   Dyslipidemia 05/04/2014   Hearing loss    mild   Hernia, inguinal    right   Hyperglycemia    Hypertension    Ileus, postoperative ([place]) 05/04/2014   Lumbar disc disease    Old inferior wall myocardial infarction 2004     Family History  Problem Relation Age of Onset   Heart disease Father    Heart disease Brother    Hypertension Brother    Cancer Sister    Heart disease Paternal Grandmother     Sister and brother both have lung cancer.    Social History   Socioeconomic History   Marital status: Married    Spouse name: [name]   Number of children: 2   Years of education: Masters   Highest education level: Not on file  Occupational History   Occupation: Clinical biochemist  Tobacco Use   Smoking status: Former    Types: Cigarettes    Quit date: 06/24/1979    Years since quitting: 42.0   Smokeless tobacco: Former    Types: Nurse, children's Use: Never used  Substance and Sexual Activity   Alcohol use: Yes    Alcohol/week: 14.0 standard drinks    Types: 14 Cans of beer per week    Comment: 2 drinks a day   Drug use: No  Sexual activity: Yes  Other Topics Concern   Not on file  Social History Narrative   Patient is married [name]) and lives at home with his wife.   Patient has two adult children.   Patient is working full-time.   Patient has a Master's degree.   Patient drinks one cup of coffee every morning.   Patient is right-handed.   Social Determinants of Health   Financial Resource Strain: Not on file  Food Insecurity: Not on file  Transportation Needs: Not on file  Physical Activity: Not on file  Stress: Not on file  Social Connections: Not on file  Intimate Partner Violence: Not on file      No Known Allergies   Outpatient Medications Prior to Visit  Medication Sig Dispense Refill   aspirin (HM  ASPIRIN) 81 MG chewable tablet Chew 1 tablet (81 mg total) by mouth daily. 90 tablet 3   BELSOMRA 15 MG TABS TAKE 1 TABLET BY MOUTH AT BEDTIME AS NEEDED 30 tablet 5   canagliflozin (INVOKANA) 100 MG TABS tablet TAKE 1 TABLET BY MOUTH EVERY DAY BEFORE BREAKFAST 30 tablet 0   fenofibrate (TRICOR) 145 MG tablet TAKE 1 TABLET BY MOUTH DAILY 30 tablet 0   fish oil-omega-3 fatty acids 1000 MG capsule Take 2 g by mouth daily.     lisinopril (ZESTRIL) 40 MG tablet Take 1 tablet (40 mg total) by mouth daily. 90 tablet 0   metFORMIN (GLUCOPHAGE-XR) 500 MG 24 hr tablet TAKE 2 TABLETS BY MOUTH EVERY MORNING 60 tablet 0   metoprolol succinate (TOPROL-XL) 50 MG 24 hr tablet TAKE 1 TABLET BY MOUTH DAILY 30 tablet 0   Multiple Vitamin (MULTIVITAMIN ADULT PO) Take by mouth.     nitroGLYCERIN (NITROSTAT) 0.4 MG SL tablet Place 1 tablet (0.4 mg total) under the tongue every 5 (five) minutes as needed for chest pain. 25 tablet 5   rosuvastatin (CRESTOR) 10 MG tablet TAKE 1 TABLET BY MOUTH DAILY 30 tablet 0   No facility-administered medications prior to visit.         Objective:   Physical Exam Vitals:   06/24/21 1042  BP: 140/82  Pulse: (!) 58  Temp: 98.1 F (36.7 C)  TempSrc: Oral  SpO2: 99%  Weight: 236 lb 3.2 oz (107.1 kg)  Height: 6' (1.829 m)   Gen: Pleasant, well-nourished, in no distress,  normal affect  ENT: No lesions,  mouth clear,  oropharynx clear, no postnasal drip  Neck: No JVD, no stridor  Lungs: No use of accessory muscles, no crackles or wheezing on normal respiration, no wheeze on forced expiration  Cardiovascular: RRR, heart sounds normal, no murmur or gallops, no peripheral edema  Musculoskeletal: No deformities, no cyanosis or clubbing  Neuro: alert, awake, non focal  Skin: Warm, no lesions or rash      Assessment & Plan:   Pulmonary nodule PET scan was reassuring without any significant nodal hypermetabolism or any evidence of mediastinal or distant disease.   The clinical picture is consistent with a slow-growing adenocarcinoma.  I think he would be a good candidate for surgical resection.  He is still thinking about it, says that he will probably agree but wants to talk to some of his friends who are surgeons, his other physicians and also to research our local physicians.  I provided him with the names of the excellent thoracic surgeons to whom I would refer him.  In the meantime we will get pulmonary function testing.  If he  decides not to pursue surgery then I would offer him navigational bronchoscopy to facilitate radiation therapy.  Time spent 30 minutes  Baltazar Apo, MD, PhD 06/24/2021, 11:23 AM [facility] [phone] or if no answer before 7:00PM call [phone] For any issues after 7:00PM please call eLink [phone]

## 2021-06-29 ENCOUNTER — Other Ambulatory Visit: Payer: Self-pay | Admitting: Family Medicine

## 2021-07-01 DIAGNOSIS — Z20828 Contact with and (suspected) exposure to other viral communicable diseases: Secondary | ICD-10-CM | POA: Diagnosis not present

## 2021-07-05 ENCOUNTER — Other Ambulatory Visit: Payer: Self-pay | Admitting: *Deleted

## 2021-07-06 ENCOUNTER — Other Ambulatory Visit: Payer: Self-pay

## 2021-07-06 ENCOUNTER — Ambulatory Visit (INDEPENDENT_AMBULATORY_CARE_PROVIDER_SITE_OTHER): Payer: Medicare Other | Admitting: Physician Assistant

## 2021-07-06 ENCOUNTER — Encounter: Payer: Self-pay | Admitting: Physician Assistant

## 2021-07-06 ENCOUNTER — Telehealth (HOSPITAL_COMMUNITY): Payer: Self-pay

## 2021-07-06 VITALS — BP 138/72 | HR 57 | Ht 72.0 in | Wt 238.2 lb

## 2021-07-06 DIAGNOSIS — E782 Mixed hyperlipidemia: Secondary | ICD-10-CM

## 2021-07-06 DIAGNOSIS — Z01818 Encounter for other preprocedural examination: Secondary | ICD-10-CM

## 2021-07-06 DIAGNOSIS — I712 Thoracic aortic aneurysm, without rupture, unspecified: Secondary | ICD-10-CM | POA: Diagnosis not present

## 2021-07-06 DIAGNOSIS — R0989 Other specified symptoms and signs involving the circulatory and respiratory systems: Secondary | ICD-10-CM | POA: Diagnosis not present

## 2021-07-06 DIAGNOSIS — Z01810 Encounter for preprocedural cardiovascular examination: Secondary | ICD-10-CM

## 2021-07-06 DIAGNOSIS — E1159 Type 2 diabetes mellitus with other circulatory complications: Secondary | ICD-10-CM | POA: Diagnosis not present

## 2021-07-06 DIAGNOSIS — I480 Paroxysmal atrial fibrillation: Secondary | ICD-10-CM

## 2021-07-06 DIAGNOSIS — I25700 Atherosclerosis of coronary artery bypass graft(s), unspecified, with unstable angina pectoris: Secondary | ICD-10-CM | POA: Diagnosis not present

## 2021-07-06 DIAGNOSIS — I25810 Atherosclerosis of coronary artery bypass graft(s) without angina pectoris: Secondary | ICD-10-CM

## 2021-07-06 DIAGNOSIS — I10 Essential (primary) hypertension: Secondary | ICD-10-CM

## 2021-07-06 DIAGNOSIS — G4733 Obstructive sleep apnea (adult) (pediatric): Secondary | ICD-10-CM | POA: Diagnosis not present

## 2021-07-06 DIAGNOSIS — R0609 Other forms of dyspnea: Secondary | ICD-10-CM | POA: Diagnosis not present

## 2021-07-06 NOTE — Telephone Encounter (Signed)
Spoke with the patient, detailed instructions given. He stated that he would be here for his test. Asked to call back with any questions. S.Iori Gigante EMTP 

## 2021-07-06 NOTE — Patient Instructions (Signed)
Medication Instructions:  Your physician recommends that you continue on your current medications as directed. Please refer to the Current Medication list given to you today.  *If you need a refill on your cardiac medications before your next appointment, please call your pharmacy*   Lab Work: None If you have labs (blood work) drawn today and your tests are completely normal, you will receive your results only by: [place] (if you have MyChart) OR A paper copy in the mail If you have any lab test that is abnormal or we need to change your treatment, we will call you to review the results.   Testing/Procedures: Your physician has requested that you have a lexiscan myoview. For further information please visit [email or web address]. Please follow instruction sheet, as given.  Your physician has requested that you have a carotid duplex. This test is an ultrasound of the carotid arteries in your neck. It looks at blood flow through these arteries that supply the brain with blood. Allow one hour for this exam. There are no restrictions or special instructions.   Follow-Up: At [facility], you and your health needs are our priority.  As part of our continuing mission to provide you with exceptional heart care, we have created designated Provider Care Teams.  These Care Teams include your primary Cardiologist (physician) and Advanced Practice Providers (APPs -  Physician Assistants and Nurse Practitioners) who all work together to provide you with the care you need, when you need it.   Your next appointment:   As scheduled

## 2021-07-07 ENCOUNTER — Ambulatory Visit (INDEPENDENT_AMBULATORY_CARE_PROVIDER_SITE_OTHER): Payer: Medicare Other | Admitting: Emergency Medicine

## 2021-07-07 DIAGNOSIS — R911 Solitary pulmonary nodule: Secondary | ICD-10-CM

## 2021-07-07 DIAGNOSIS — C801 Malignant (primary) neoplasm, unspecified: Secondary | ICD-10-CM

## 2021-07-07 HISTORY — DX: Malignant (primary) neoplasm, unspecified: C80.1

## 2021-07-07 LAB — PULMONARY FUNCTION TEST
DL/VA % pred: 88 %
DL/VA: 3.46 ml/min/mmHg/L
DLCO cor % pred: 89 %
DLCO cor: 24.5 ml/min/mmHg
DLCO unc % pred: 89 %
DLCO unc: 24.5 ml/min/mmHg
FEF 25-75 Post: 3.12 L/sec
FEF 25-75 Pre: 3.13 L/sec
FEF2575-%Change-Post: 0 %
FEF2575-%Pred-Post: 124 %
FEF2575-%Pred-Pre: 124 %
FEV1-%Change-Post: 0 %
FEV1-%Pred-Post: 104 %
FEV1-%Pred-Pre: 104 %
FEV1-Post: 3.61 L
FEV1-Pre: 3.63 L
FEV1FVC-%Change-Post: 3 %
FEV1FVC-%Pred-Pre: 106 %
FEV6-%Change-Post: -2 %
FEV6-%Pred-Post: 100 %
FEV6-%Pred-Pre: 102 %
FEV6-Post: 4.49 L
FEV6-Pre: 4.6 L
FEV6FVC-%Change-Post: 0 %
FEV6FVC-%Pred-Post: 105 %
FEV6FVC-%Pred-Pre: 104 %
FVC-%Change-Post: -3 %
FVC-%Pred-Post: 95 %
FVC-%Pred-Pre: 98 %
FVC-Post: 4.52 L
FVC-Pre: 4.67 L
Post FEV1/FVC ratio: 80 %
Post FEV6/FVC ratio: 99 %
Pre FEV1/FVC ratio: 78 %
Pre FEV6/FVC Ratio: 99 %
RV % pred: 75 %
RV: 2.05 L
TLC % pred: 90 %
TLC: 6.89 L

## 2021-07-07 NOTE — Progress Notes (Signed)
Full PFT completed today ? ?

## 2021-07-07 NOTE — Addendum Note (Signed)
Addended by: Gar Ponto on: 07/07/2021 01:07 PM   Modules accepted: Orders

## 2021-07-08 ENCOUNTER — Telehealth: Payer: Self-pay

## 2021-07-08 ENCOUNTER — Other Ambulatory Visit: Payer: Self-pay | Admitting: Family Medicine

## 2021-07-08 DIAGNOSIS — I25700 Atherosclerosis of coronary artery bypass graft(s), unspecified, with unstable angina pectoris: Secondary | ICD-10-CM

## 2021-07-08 DIAGNOSIS — R911 Solitary pulmonary nodule: Secondary | ICD-10-CM

## 2021-07-08 DIAGNOSIS — E782 Mixed hyperlipidemia: Secondary | ICD-10-CM

## 2021-07-08 DIAGNOSIS — E119 Type 2 diabetes mellitus without complications: Secondary | ICD-10-CM

## 2021-07-08 NOTE — Telephone Encounter (Signed)
Received following message from Dr. Lamonte Sakai on pt:  I just looked at the PFT >> he is a good candidate for sgy. I agree with sending him to see Dr Roxan Hockey. he can cancel the 12/27 visit.    ATC pt LVMTCB regarding placing order for cardiothoaric surgery and canceling OV on 08/02/21. Will attempt to call at later time. Pended referral order.

## 2021-07-08 NOTE — Telephone Encounter (Signed)
I spoke with the pt and notified of response per Dr Lamonte Sakai  She verbalized understanding  Referral placed ok per pt

## 2021-07-12 ENCOUNTER — Other Ambulatory Visit: Payer: Self-pay

## 2021-07-12 ENCOUNTER — Ambulatory Visit (HOSPITAL_COMMUNITY): Payer: Medicare Other | Attending: Physician Assistant

## 2021-07-12 DIAGNOSIS — R0609 Other forms of dyspnea: Secondary | ICD-10-CM

## 2021-07-12 DIAGNOSIS — Z01810 Encounter for preprocedural cardiovascular examination: Secondary | ICD-10-CM

## 2021-07-12 DIAGNOSIS — Z01818 Encounter for other preprocedural examination: Secondary | ICD-10-CM | POA: Diagnosis not present

## 2021-07-12 LAB — MYOCARDIAL PERFUSION IMAGING
LV dias vol: 122 mL (ref 62–150)
LV sys vol: 60 mL
Nuc Stress EF: 51 %
Peak HR: 67 {beats}/min
Rest HR: 54 {beats}/min
Rest Nuclear Isotope Dose: 10.8 mCi
SDS: 1
SRS: 4
SSS: 5
ST Depression (mm): 0 mm
Stress Nuclear Isotope Dose: 31.5 mCi
TID: 1.04

## 2021-07-12 MED ORDER — TECHNETIUM TC 99M TETROFOSMIN IV KIT
31.5000 | PACK | Freq: Once | INTRAVENOUS | Status: AC | PRN
  Administered 2021-07-12: 31.5 via INTRAVENOUS
  Filled 2021-07-12: qty 32

## 2021-07-12 MED ORDER — TECHNETIUM TC 99M TETROFOSMIN IV KIT
10.8000 | PACK | Freq: Once | INTRAVENOUS | Status: AC | PRN
  Administered 2021-07-12: 10.8 via INTRAVENOUS
  Filled 2021-07-12: qty 11

## 2021-07-12 MED ORDER — REGADENOSON 0.4 MG/5ML IV SOLN
0.4000 mg | Freq: Once | INTRAVENOUS | Status: AC
  Administered 2021-07-12: 0.4 mg via INTRAVENOUS

## 2021-07-13 NOTE — Telephone Encounter (Signed)
Imogene Burn, PA-C  07/13/2021  7:52 AM EST     Patient's NST shows no ischemia and low risk study. Similar to last stress test. He can be cleared for upcoming lung surgery. Please ask patient surgeons name and date of surgery so we can clear him. thanks    Called patient with results written above. Patient stated he does not know the surgeons name, because he has not met him yet and his first appointment is next Wednesday. Patient stated there is no surgery scheduled yet. Informed patient that we would make note of this in his chart, so if clearance is needed in the future, that it will be here. Patient verbalized understanding and thanked [place] for all our help.

## 2021-07-18 ENCOUNTER — Ambulatory Visit (HOSPITAL_COMMUNITY)
Admission: RE | Admit: 2021-07-18 | Discharge: 2021-07-18 | Disposition: A | Discharge status: Home / Self Care | Payer: Medicare Other | Source: Ambulatory Visit | Attending: Cardiology | Admitting: Cardiology

## 2021-07-18 ENCOUNTER — Other Ambulatory Visit: Payer: Self-pay

## 2021-07-18 DIAGNOSIS — R0989 Other specified symptoms and signs involving the circulatory and respiratory systems: Secondary | ICD-10-CM | POA: Insufficient documentation

## 2021-07-21 ENCOUNTER — Institutional Professional Consult (permissible substitution) (INDEPENDENT_AMBULATORY_CARE_PROVIDER_SITE_OTHER): Payer: Medicare Other | Admitting: Thoracic Surgery (Cardiothoracic Vascular Surgery)

## 2021-07-21 ENCOUNTER — Other Ambulatory Visit: Payer: Self-pay

## 2021-07-21 ENCOUNTER — Encounter: Payer: Self-pay | Admitting: Thoracic Surgery (Cardiothoracic Vascular Surgery)

## 2021-07-21 VITALS — BP 143/79 | HR 59 | Resp 18 | Ht 72.0 in | Wt 235.0 lb

## 2021-07-21 DIAGNOSIS — I25700 Atherosclerosis of coronary artery bypass graft(s), unspecified, with unstable angina pectoris: Secondary | ICD-10-CM

## 2021-07-21 DIAGNOSIS — R911 Solitary pulmonary nodule: Secondary | ICD-10-CM | POA: Diagnosis not present

## 2021-07-22 ENCOUNTER — Encounter: Payer: Self-pay | Admitting: *Deleted

## 2021-07-22 ENCOUNTER — Other Ambulatory Visit: Payer: Self-pay | Admitting: *Deleted

## 2021-07-22 DIAGNOSIS — Z5181 Encounter for therapeutic drug level monitoring: Secondary | ICD-10-CM

## 2021-07-22 DIAGNOSIS — R911 Solitary pulmonary nodule: Secondary | ICD-10-CM

## 2021-07-22 NOTE — Progress Notes (Signed)
PCP is Copland, Gay Filler, MD Referring Provider is Collene Gobble, MD  Chief Complaint  Patient presents with   Lung Lesion    Surgical consult, Chest CTA 05/19/21, PET Scan 06/23/21,  PFT's 07/07/21    HPI:Perry [name] is sent for consultation regarding a right upper lobe lung nodule.  Perry [name] is a 75 year old man with a history of remote tobacco use, CAD, MI, CABG in 2004, ascending aortic aneurysm, hypertension, hyperlipidemia, sleep apnea, paroxysmal atrial fibrillation, and glucose intolerance.  He has been followed for a ground glass opacity in the right upper lobe for several years.  It was first noted in 2019 on a Ct done to follow his aneurysm.  He has been followed over time with minimal change from scan to scan but overall slow growth compared to a couple of years ago.  He feels well. 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  Denies chest pain, pressure, tightness, shortness of breath. No change in appetite, weight loss. Smoked in past but quit 42 years ago.  Has been seen by cardiology and cleared for surgery.  Zubrod Score: At the time of surgery this patients most appropriate activity status/level should be described as: [x]     0    Normal activity, no symptoms []     1    Restricted in physical strenuous activity but ambulatory, able to do out light work []     2    Ambulatory and capable of self care, unable to do work activities, up and about >50 % of waking hours                              []     3    Only limited self care, in bed greater than 50% of waking hours []     4    Completely disabled, no self care, confined to bed or chair []     5    Moribund    Past Medical History:  Diagnosis Date   A-fib ([place]) 04/2014   Abscess of abdominal cavity ([place]) 05/04/2014   Acute appendicitis with perforation and peritoneal abscess 05/04/2014   Allergy    CAD (coronary artery disease)    CAD (coronary artery disease) of artery bypass graft 05/04/2014   CHF (congestive heart  failure) ([place])    pt not aware   Diabetes mellitus    not on medication - pt not aware of this   Dyslipidemia 05/04/2014   Hearing loss    mild   Hernia, inguinal    right   Hyperglycemia    Hypertension    Ileus, postoperative ([place]) 05/04/2014   Lumbar disc disease    Old inferior wall myocardial infarction 2004    Past Surgical History:  Procedure Laterality Date   COLONOSCOPY     CORONARY ANGIOPLASTY     multiple   CORONARY ARTERY BYPASS GRAFT  2004   LIMA to LAD and SVG to RCA (emergent)   LAPAROSCOPIC APPENDECTOMY N/A 04/21/2014   Procedure: APPENDECTOMY LAPAROSCOPIC;  Surgeon: Rolm Bookbinder, MD;  Location: [facility];  Service: General;  Laterality: N/A;   LAPAROSCOPIC APPENDECTOMY N/A 06/24/2014   Procedure: APPENDECTOMY LAPAROSCOPIC ;  Surgeon: Rolm Bookbinder, MD;  Location: [facility] OR;  Service: General;  Laterality: N/A;   LUMBAR LAMINECTOMY  2001   lumbar   TONSILLECTOMY      Family History  Problem Relation Age of Onset   Heart disease Father  Heart disease Brother    Hypertension Brother    Cancer Sister    Heart disease Paternal Grandmother     Social History Social History   Tobacco Use   Smoking status: Former    Types: Cigarettes    Quit date: 06/24/1979    Years since quitting: 42.1   Smokeless tobacco: Former    Types: Nurse, children's Use: Never used  Substance Use Topics   Alcohol use: Yes    Alcohol/week: 14.0 standard drinks    Types: 14 Cans of beer per week    Comment: 2 drinks a day   Drug use: No    Current Outpatient Medications  Medication Sig Dispense Refill   aspirin (HM ASPIRIN) 81 MG chewable tablet Chew 1 tablet (81 mg total) by mouth daily. 90 tablet 3   BELSOMRA 15 MG TABS TAKE 1 TABLET BY MOUTH AT BEDTIME AS NEEDED 30 tablet 5   canagliflozin (INVOKANA) 100 MG TABS tablet TAKE 1 TABLET BY MOUTH EVERY DAY BEFORE BREAKFAST 30 tablet 0   COVID-19 mRNA bivalent vaccine, Pfizer, (PFIZER COVID-19 VAC BIVALENT)  injection Inject into the muscle. 0.3 mL 0   fenofibrate (TRICOR) 145 MG tablet TAKE 1 TABLET BY MOUTH DAILY - NEEDS LABWORK BEFORE ANY MORE REFILLS 30 tablet 0   fish oil-omega-3 fatty acids 1000 MG capsule Take 2 g by mouth daily.     lisinopril (ZESTRIL) 40 MG tablet Take 1 tablet (40 mg total) by mouth daily. 90 tablet 0   metFORMIN (GLUCOPHAGE-XR) 500 MG 24 hr tablet TAKE 2 TABLETS BY MOUTH EVERY MORNING - NEEDS LABWORK BEFORE ANY MORE REFILLS 60 tablet 0   metoprolol succinate (TOPROL-XL) 50 MG 24 hr tablet Take 1 tablet (50 mg total) by mouth daily. Take with or immediately following a meal 90 tablet 1   Multiple Vitamin (MULTIVITAMIN ADULT PO) Take by mouth.     nitroGLYCERIN (NITROSTAT) 0.4 MG SL tablet Place 1 tablet (0.4 mg total) under the tongue every 5 (five) minutes as needed for chest pain. 25 tablet 5   rosuvastatin (CRESTOR) 10 MG tablet TAKE 1 TABLET BY MOUTH DAILY 30 tablet 0   No current facility-administered medications for this visit.    No Known Allergies  Review of Systems  Constitutional:  Negative for activity change, appetite change and unexpected weight change.  HENT:  Negative for trouble swallowing and voice change.   Respiratory:  Negative for chest tightness and shortness of breath.   Cardiovascular:  Negative for chest pain.  Genitourinary:  Negative for difficulty urinating and dysuria.  Musculoskeletal:  Negative for arthralgias.  Neurological:  Negative for seizures and weakness.  Hematological:  Negative for adenopathy. Does not bruise/bleed easily.   BP (!) 143/79 (BP Location: Left Arm, Patient Position: Sitting, Cuff Size: Large)    Pulse (!) 59    Resp 18    Ht 6' (1.829 m)    Wt 235 lb (106.6 kg)    SpO2 97% Comment: RA   BMI 31.87 kg/m  Physical Exam Vitals reviewed.  Constitutional:      General: He is not in acute distress.    Appearance: Normal appearance.  HENT:     Head: Normocephalic and atraumatic.  Eyes:     General: No scleral  icterus.    Extraocular Movements: Extraocular movements intact.  Cardiovascular:     Rate and Rhythm: Normal rate and regular rhythm.     Heart sounds: Normal heart sounds.  Pulmonary:     Effort: No respiratory distress.     Breath sounds: Normal breath sounds. No wheezing or rales.  Abdominal:     General: There is no distension.     Palpations: Abdomen is soft.  Lymphadenopathy:     Cervical: No cervical adenopathy.  Skin:    General: Skin is warm and dry.  Neurological:     General: No focal deficit present.     Mental Status: He is alert and oriented to person, place, and time.     Cranial Nerves: No cranial nerve deficit.     Motor: No weakness.     Diagnostic Tests: CT ANGIOGRAPHY CHEST WITH CONTRAST   TECHNIQUE: Multidetector CT imaging of the chest was performed using the standard protocol during bolus administration of intravenous contrast. Multiplanar CT image reconstructions and MIPs were obtained to evaluate the vascular anatomy.   CONTRAST:  73mL ISOVUE-370 IOPAMIDOL (ISOVUE-370) INJECTION 76%   COMPARISON:  05/21/2020   FINDINGS: Cardiovascular: The aortic root measures roughly 3.6-3.8 cm at the level of the sinuses of Valsalva. The ascending thoracic aorta demonstrates stable mild dilatation measuring up to 4.1 cm in estimated maximal caliber. The proximal arch measures 3.5 cm and the distal arch 3.2 cm. The descending thoracic aorta measures 2.9 cm. No evidence of aortic dissection. Proximal great vessels demonstrate stable normal patency and branching anatomy.   The heart size is normal. There is evidence of prior CABG. Central pulmonary arteries are normal in caliber. No pericardial fluid identified.   Mediastinum/Nodes: No enlarged mediastinal, hilar, or axillary lymph nodes. Thyroid gland, trachea, and esophagus demonstrate no significant findings.   Lungs/Pleura: The previously noted irregular ground-glass opacity in the posterior right  upper lobe appears slightly more prominent compared to the prior study and a 2019 study. This density remains vaguely marginated and irregular in shape with relative flat shape on coronal and sagittal reconstructions. On the axial images maximal diameter is difficult to measure as the area is not round but ground-glass opacity spans over an area roughly 18-19 mm compared to 15 mm previously. No other pulmonary lesions are identified. There is no evidence of pulmonary edema, consolidation, pneumothorax or pleural fluid.   Upper Abdomen: There are a few dependent calcified gallstones.   Musculoskeletal: No chest wall abnormality. No acute or significant osseous findings.   Review of the MIP images confirms the above findings.   IMPRESSION: 1. Stable mild aneurysmal disease of the ascending thoracic aorta measuring up to 4.1 cm in estimated maximal diameter. Recommend annual imaging followup by CTA or MRA. This recommendation follows 2010 ACCF/AHA/AATS/ACR/ASA/SCA/SCAI/SIR/STS/SVM Guidelines for the Diagnosis and Management of Patients with Thoracic Aortic Disease. Circulation. 2010; 121: H631-S970. Aortic aneurysm NOS (ICD10-I71.9) 2. Enlarging ground-glass opacity in the posterior right upper lobe which remains difficult to measure and does not have a definitive solid nodular component. Region of maximal extent now measures roughly 18-19 mm compared to 15 mm previously. Recommend further multi disciplinary thoracic oncologic referral ([place]) for further workup and consideration of resection versus biopsy. At the size of this current abnormality, a PET scan may also be useful for further workup. 3. Cholelithiasis.   Aortic aneurysm NOS (ICD10-I71.9).     Electronically Signed   By: Aletta Edouard M.D.   On: 05/20/2021 15:52 NUCLEAR MEDICINE PET SKULL BASE TO THIGH   TECHNIQUE: 11.8 mCi F-18 FDG was injected intravenously. Full-ring PET imaging was performed from the skull  base to thigh after the radiotracer. CT data was obtained  and used for attenuation correction and anatomic localization.   Fasting blood glucose: 157 mg/dl   COMPARISON:  CT chest dated May 19, 2021   FINDINGS: Mediastinal blood pool activity: SUV max 2.7   Liver activity: SUV max 3.6   NECK: No hypermetabolic lymph nodes in the neck.   Incidental CT findings: none   CHEST: Ground-glass nodule of the right upper lobe is unchanged in size compared to prior exam and demonstrates mild FDG uptake which is below mediastinal blood pool, SUV max of 1.6. no hypermetabolic lymph nodes seen in the chest   Incidental CT findings: Mild cardiomegaly. Three-vessel coronary artery calcifications status post CABG. Atherosclerotic disease of the thoracic aorta. Small hiatal hernia.   ABDOMEN/PELVIS: No abnormal hypermetabolic activity within the liver, pancreas, adrenal glands, or spleen. No hypermetabolic lymph nodes in the abdomen or pelvis.   Incidental CT findings: Cholelithiasis. Simple cyst of the right kidney. Prostatomegaly, measuring up to 6.7 cm. Atherosclerotic disease of the abdominal aorta.   SKELETON: No focal hypermetabolic activity to suggest skeletal metastasis.   Incidental CT findings: none   IMPRESSION: 1. Ground-glass nodule of the right upper lobe demonstrates mild FDG uptake below mediastinal blood pool. Indolent primary lung neoplasm remains a concern and follow-up chest CT in 6 months is recommended. 2. No hypermetabolic lymph nodes seen in the chest. 3. No evidence of metastatic disease in the abdomen or pelvis.     Electronically Signed   By: Yetta Glassman M.D.   On: 06/24/2021 09:16   I personally reviewed the CT and PET CT images. There is a ground glass opacity in the right upper lobe that is mildly hypermetabolic on PET.   Pulmonary function testing FVC 4.67(98%) FEV1 3.63(104%) DLCO 24.50(89%)  Impression: Blakeley Margraf is a 75 year  old man with a history of remote tobacco use, CAD, MI, CABG in 2004, ascending aortic aneurysm, hypertension, hyperlipidemia, sleep apnea, paroxysmal atrial fibrillation, and glucose intolerance.  He has been followed for a ground glass nodule in the right upper lobe. Over time there has been slow growth and a slight increase in density of the nodule.  Differential diagnosis includes a low grade adenocarcinoma, as well as infectious or inflammatory nodule.   Options include continued radiographic follow up, bronchoscopic biopsy or surgical resection. I discussed the advantages and disadvantages of the options with Mr. Wayson.  He strongly favors resection.  The lesion is not particularly favorable for wedge but is amenable to a posterior segmentectomy.  I discussed with the patient the general nature of the procedure, including the need for general anesthesia, the incisions to be used, the use of a drainage tube postoperatively with Mr. Lodes. We discussed the expected hospital stay, overall recovery and short and long term outcomes. I informed him of the indications, risks, benefits and alternatives.  They understand the risks include, but are not limited to death, stroke, MI, DVT/PE, bleeding, possible need for transfusion, infections, conversion to thoracotomy, as well as other organ system dysfunction including respiratory, renal, or GI complications.   He accepts the risks and wishes to proceed.   Plan: Right upper lobe posterior segmentectomy, possible lobectomy  Melrose Nakayama, MD [facility] [phone]

## 2021-07-22 NOTE — H&P (View-Only) (Signed)
PCP is Copland, Gay Filler, MD Referring Provider is Collene Gobble, MD  Chief Complaint  Patient presents with   Lung Lesion    Surgical consult, Chest CTA 05/19/21, PET Scan 06/23/21,  PFT's 07/07/21    HPI:[name] is sent for consultation regarding a right upper lobe lung nodule.  Perry [name] is a 75 year old man with a history of remote tobacco use, CAD, MI, CABG in 2004, ascending aortic aneurysm, hypertension, hyperlipidemia, sleep apnea, paroxysmal atrial fibrillation, and glucose intolerance.  He has been followed for a ground glass opacity in the right upper lobe for several years.  It was first noted in 2019 on a Ct done to follow his aneurysm.  He has been followed over time with minimal change from scan to scan but overall slow growth compared to a couple of years ago.  He feels well. 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  Denies chest pain, pressure, tightness, shortness of breath. No change in appetite, weight loss. Smoked in past but quit 42 years ago.  Has been seen by cardiology and cleared for surgery.  Zubrod Score: At the time of surgery this patients most appropriate activity status/level should be described as: [x]     0    Normal activity, no symptoms []     1    Restricted in physical strenuous activity but ambulatory, able to do out light work []     2    Ambulatory and capable of self care, unable to do work activities, up and about >50 % of waking hours                              []     3    Only limited self care, in bed greater than 50% of waking hours []     4    Completely disabled, no self care, confined to bed or chair []     5    Moribund    Past Medical History:  Diagnosis Date   A-fib ([place]) 04/2014   Abscess of abdominal cavity ([place]) 05/04/2014   Acute appendicitis with perforation and peritoneal abscess 05/04/2014   Allergy    CAD (coronary artery disease)    CAD (coronary artery disease) of artery bypass graft 05/04/2014   CHF (congestive heart  failure) ([place])    pt not aware   Diabetes mellitus    not on medication - pt not aware of this   Dyslipidemia 05/04/2014   Hearing loss    mild   Hernia, inguinal    right   Hyperglycemia    Hypertension    Ileus, postoperative ([place]) 05/04/2014   Lumbar disc disease    Old inferior wall myocardial infarction 2004    Past Surgical History:  Procedure Laterality Date   COLONOSCOPY     CORONARY ANGIOPLASTY     multiple   CORONARY ARTERY BYPASS GRAFT  2004   LIMA to LAD and SVG to RCA (emergent)   LAPAROSCOPIC APPENDECTOMY N/A 04/21/2014   Procedure: APPENDECTOMY LAPAROSCOPIC;  Surgeon: Rolm Bookbinder, MD;  Location: [facility];  Service: General;  Laterality: N/A;   LAPAROSCOPIC APPENDECTOMY N/A 06/24/2014   Procedure: APPENDECTOMY LAPAROSCOPIC ;  Surgeon: Rolm Bookbinder, MD;  Location: [facility] OR;  Service: General;  Laterality: N/A;   LUMBAR LAMINECTOMY  2001   lumbar   TONSILLECTOMY      Family History  Problem Relation Age of Onset   Heart disease Father  Heart disease Brother    Hypertension Brother    Cancer Sister    Heart disease Paternal Grandmother     Social History Social History   Tobacco Use   Smoking status: Former    Types: Cigarettes    Quit date: 06/24/1979    Years since quitting: 42.1   Smokeless tobacco: Former    Types: Nurse, children's Use: Never used  Substance Use Topics   Alcohol use: Yes    Alcohol/week: 14.0 standard drinks    Types: 14 Cans of beer per week    Comment: 2 drinks a day   Drug use: No    Current Outpatient Medications  Medication Sig Dispense Refill   aspirin (HM ASPIRIN) 81 MG chewable tablet Chew 1 tablet (81 mg total) by mouth daily. 90 tablet 3   BELSOMRA 15 MG TABS TAKE 1 TABLET BY MOUTH AT BEDTIME AS NEEDED 30 tablet 5   canagliflozin (INVOKANA) 100 MG TABS tablet TAKE 1 TABLET BY MOUTH EVERY DAY BEFORE BREAKFAST 30 tablet 0   COVID-19 mRNA bivalent vaccine, Pfizer, (PFIZER COVID-19 VAC BIVALENT)  injection Inject into the muscle. 0.3 mL 0   fenofibrate (TRICOR) 145 MG tablet TAKE 1 TABLET BY MOUTH DAILY - NEEDS LABWORK BEFORE ANY MORE REFILLS 30 tablet 0   fish oil-omega-3 fatty acids 1000 MG capsule Take 2 g by mouth daily.     lisinopril (ZESTRIL) 40 MG tablet Take 1 tablet (40 mg total) by mouth daily. 90 tablet 0   metFORMIN (GLUCOPHAGE-XR) 500 MG 24 hr tablet TAKE 2 TABLETS BY MOUTH EVERY MORNING - NEEDS LABWORK BEFORE ANY MORE REFILLS 60 tablet 0   metoprolol succinate (TOPROL-XL) 50 MG 24 hr tablet Take 1 tablet (50 mg total) by mouth daily. Take with or immediately following a meal 90 tablet 1   Multiple Vitamin (MULTIVITAMIN ADULT PO) Take by mouth.     nitroGLYCERIN (NITROSTAT) 0.4 MG SL tablet Place 1 tablet (0.4 mg total) under the tongue every 5 (five) minutes as needed for chest pain. 25 tablet 5   rosuvastatin (CRESTOR) 10 MG tablet TAKE 1 TABLET BY MOUTH DAILY 30 tablet 0   No current facility-administered medications for this visit.    No Known Allergies  Review of Systems  Constitutional:  Negative for activity change, appetite change and unexpected weight change.  HENT:  Negative for trouble swallowing and voice change.   Respiratory:  Negative for chest tightness and shortness of breath.   Cardiovascular:  Negative for chest pain.  Genitourinary:  Negative for difficulty urinating and dysuria.  Musculoskeletal:  Negative for arthralgias.  Neurological:  Negative for seizures and weakness.  Hematological:  Negative for adenopathy. Does not bruise/bleed easily.   BP (!) 143/79 (BP Location: Left Arm, Patient Position: Sitting, Cuff Size: Large)    Pulse (!) 59    Resp 18    Ht 6' (1.829 m)    Wt 235 lb (106.6 kg)    SpO2 97% Comment: RA   BMI 31.87 kg/m  Physical Exam Vitals reviewed.  Constitutional:      General: He is not in acute distress.    Appearance: Normal appearance.  HENT:     Head: Normocephalic and atraumatic.  Eyes:     General: No scleral  icterus.    Extraocular Movements: Extraocular movements intact.  Cardiovascular:     Rate and Rhythm: Normal rate and regular rhythm.     Heart sounds: Normal heart sounds.  Pulmonary:     Effort: No respiratory distress.     Breath sounds: Normal breath sounds. No wheezing or rales.  Abdominal:     General: There is no distension.     Palpations: Abdomen is soft.  Lymphadenopathy:     Cervical: No cervical adenopathy.  Skin:    General: Skin is warm and dry.  Neurological:     General: No focal deficit present.     Mental Status: He is alert and oriented to person, place, and time.     Cranial Nerves: No cranial nerve deficit.     Motor: No weakness.     Diagnostic Tests: CT ANGIOGRAPHY CHEST WITH CONTRAST   TECHNIQUE: Multidetector CT imaging of the chest was performed using the standard protocol during bolus administration of intravenous contrast. Multiplanar CT image reconstructions and MIPs were obtained to evaluate the vascular anatomy.   CONTRAST:  37mL ISOVUE-370 IOPAMIDOL (ISOVUE-370) INJECTION 76%   COMPARISON:  05/21/2020   FINDINGS: Cardiovascular: The aortic root measures roughly 3.6-3.8 cm at the level of the sinuses of Valsalva. The ascending thoracic aorta demonstrates stable mild dilatation measuring up to 4.1 cm in estimated maximal caliber. The proximal arch measures 3.5 cm and the distal arch 3.2 cm. The descending thoracic aorta measures 2.9 cm. No evidence of aortic dissection. Proximal great vessels demonstrate stable normal patency and branching anatomy.   The heart size is normal. There is evidence of prior CABG. Central pulmonary arteries are normal in caliber. No pericardial fluid identified.   Mediastinum/Nodes: No enlarged mediastinal, hilar, or axillary lymph nodes. Thyroid gland, trachea, and esophagus demonstrate no significant findings.   Lungs/Pleura: The previously noted irregular ground-glass opacity in the posterior right  upper lobe appears slightly more prominent compared to the prior study and a 2019 study. This density remains vaguely marginated and irregular in shape with relative flat shape on coronal and sagittal reconstructions. On the axial images maximal diameter is difficult to measure as the area is not round but ground-glass opacity spans over an area roughly 18-19 mm compared to 15 mm previously. No other pulmonary lesions are identified. There is no evidence of pulmonary edema, consolidation, pneumothorax or pleural fluid.   Upper Abdomen: There are a few dependent calcified gallstones.   Musculoskeletal: No chest wall abnormality. No acute or significant osseous findings.   Review of the MIP images confirms the above findings.   IMPRESSION: 1. Stable mild aneurysmal disease of the ascending thoracic aorta measuring up to 4.1 cm in estimated maximal diameter. Recommend annual imaging followup by CTA or MRA. This recommendation follows 2010 ACCF/AHA/AATS/ACR/ASA/SCA/SCAI/SIR/STS/SVM Guidelines for the Diagnosis and Management of Patients with Thoracic Aortic Disease. Circulation. 2010; 121: P379-K240. Aortic aneurysm NOS (ICD10-I71.9) 2. Enlarging ground-glass opacity in the posterior right upper lobe which remains difficult to measure and does not have a definitive solid nodular component. Region of maximal extent now measures roughly 18-19 mm compared to 15 mm previously. Recommend further multi disciplinary thoracic oncologic referral ([place]) for further workup and consideration of resection versus biopsy. At the size of this current abnormality, a PET scan may also be useful for further workup. 3. Cholelithiasis.   Aortic aneurysm NOS (ICD10-I71.9).     Electronically Signed   By: Aletta Edouard M.D.   On: 05/20/2021 15:52 NUCLEAR MEDICINE PET SKULL BASE TO THIGH   TECHNIQUE: 11.8 mCi F-18 FDG was injected intravenously. Full-ring PET imaging was performed from the skull  base to thigh after the radiotracer. CT data was obtained  and used for attenuation correction and anatomic localization.   Fasting blood glucose: 157 mg/dl   COMPARISON:  CT chest dated May 19, 2021   FINDINGS: Mediastinal blood pool activity: SUV max 2.7   Liver activity: SUV max 3.6   NECK: No hypermetabolic lymph nodes in the neck.   Incidental CT findings: none   CHEST: Ground-glass nodule of the right upper lobe is unchanged in size compared to prior exam and demonstrates mild FDG uptake which is below mediastinal blood pool, SUV max of 1.6. no hypermetabolic lymph nodes seen in the chest   Incidental CT findings: Mild cardiomegaly. Three-vessel coronary artery calcifications status post CABG. Atherosclerotic disease of the thoracic aorta. Small hiatal hernia.   ABDOMEN/PELVIS: No abnormal hypermetabolic activity within the liver, pancreas, adrenal glands, or spleen. No hypermetabolic lymph nodes in the abdomen or pelvis.   Incidental CT findings: Cholelithiasis. Simple cyst of the right kidney. Prostatomegaly, measuring up to 6.7 cm. Atherosclerotic disease of the abdominal aorta.   SKELETON: No focal hypermetabolic activity to suggest skeletal metastasis.   Incidental CT findings: none   IMPRESSION: 1. Ground-glass nodule of the right upper lobe demonstrates mild FDG uptake below mediastinal blood pool. Indolent primary lung neoplasm remains a concern and follow-up chest CT in 6 months is recommended. 2. No hypermetabolic lymph nodes seen in the chest. 3. No evidence of metastatic disease in the abdomen or pelvis.     Electronically Signed   By: Yetta Glassman M.D.   On: 06/24/2021 09:16   I personally reviewed the CT and PET CT images. There is a ground glass opacity in the right upper lobe that is mildly hypermetabolic on PET.   Pulmonary function testing FVC 4.67(98%) FEV1 3.63(104%) DLCO 24.50(89%)  Impression: Perry [name] is a 75 year  old man with a history of remote tobacco use, CAD, MI, CABG in 2004, ascending aortic aneurysm, hypertension, hyperlipidemia, sleep apnea, paroxysmal atrial fibrillation, and glucose intolerance.  He has been followed for a ground glass nodule in the right upper lobe. Over time there has been slow growth and a slight increase in density of the nodule.  Differential diagnosis includes a low grade adenocarcinoma, as well as infectious or inflammatory nodule.   Options include continued radiographic follow up, bronchoscopic biopsy or surgical resection. I discussed the advantages and disadvantages of the options with Perry [name].  He strongly favors resection.  The lesion is not particularly favorable for wedge but is amenable to a posterior segmentectomy.  I discussed with the patient the general nature of the procedure, including the need for general anesthesia, the incisions to be used, the use of a drainage tube postoperatively with Perry [name]. We discussed the expected hospital stay, overall recovery and short and long term outcomes. I informed him of the indications, risks, benefits and alternatives.  They understand the risks include, but are not limited to death, stroke, MI, DVT/PE, bleeding, possible need for transfusion, infections, conversion to thoracotomy, as well as other organ system dysfunction including respiratory, renal, or GI complications.   He accepts the risks and wishes to proceed.   Plan: Right upper lobe posterior segmentectomy, possible lobectomy  Melrose Nakayama, MD [facility] [phone]

## 2021-08-02 ENCOUNTER — Ambulatory Visit: Payer: Medicare Other | Admitting: Emergency Medicine

## 2021-08-02 NOTE — Progress Notes (Signed)
Surgical Instructions    Your procedure is scheduled on Friday December 30th.  Report to [facility] Main Entrance "A" at 5:30 A.M., then check in with the Admitting office.  Call this number if you have problems the morning of surgery:  [phone]   If you have any questions prior to your surgery date call [phone]: Open Monday-Friday 8am-4pm    Remember:  Do not eat or drink after midnight the night before your surgery     Take these medicines the morning of surgery with A SIP OF WATER fenofibrate (TRICOR) 145 MG tablet metoprolol succinate (TOPROL-XL) 50 MG 24 hr tablet rosuvastatin (CRESTOR) 10 MG tablet  IF NEEDED  nitroGLYCERIN (NITROSTAT) 0.4 MG SL tablet  As of today, STOP taking any Aspirin (unless otherwise instructed by your surgeon) Aleve, Naproxen, Ibuprofen, Motrin, Advil, Goody's, BC's, all herbal medications, fish oil, and all vitamins.   WHAT DO I DO ABOUT MY DIABETES MEDICATION?   Do not take oral diabetes medicines (Invokana or Metformin) the morning of surgery.  DO NOT take Invokana the day before surgery   HOW TO MANAGE YOUR DIABETES BEFORE AND AFTER SURGERY  Why is it important to control my blood sugar before and after surgery? Improving blood sugar levels before and after surgery helps healing and can limit problems. A way of improving blood sugar control is eating a healthy diet by:  Eating less sugar and carbohydrates  Increasing activity/exercise  Talking with your doctor about reaching your blood sugar goals High blood sugars (greater than 180 mg/dL) can raise your risk of infections and slow your recovery, so you will need to focus on controlling your diabetes during the weeks before surgery. Make sure that the doctor who takes care of your diabetes knows about your planned surgery including the date and location.  How do I manage my blood sugar before surgery? Check your blood sugar at least 4 times a day, starting 2 days before  surgery, to make sure that the level is not too high or low.  Check your blood sugar the morning of your surgery when you wake up and every 2 hours until you get to the Short Stay unit.  If your blood sugar is less than 70 mg/dL, you will need to treat for low blood sugar: Do not take insulin. Treat a low blood sugar (less than 70 mg/dL) with  cup of clear juice (cranberry or apple), 4 glucose tablets, OR glucose gel. Recheck blood sugar in 15 minutes after treatment (to make sure it is greater than 70 mg/dL). If your blood sugar is not greater than 70 mg/dL on recheck, call [phone] for further instructions. Report your blood sugar to the short stay nurse when you get to Short Stay.  If you are admitted to the hospital after surgery: Your blood sugar will be checked by the staff and you will probably be given insulin after surgery (instead of oral diabetes medicines) to make sure you have good blood sugar levels. The goal for blood sugar control after surgery is 80-180 mg/dL.   After your COVID test   You are not required to quarantine however you are required to wear a well-fitting mask when you are out and around people not in your household.  If your mask becomes wet or soiled, replace with a new one.  Wash your hands often with soap and water for 20 seconds or clean your hands with an alcohol-based hand sanitizer that contains at least 60% alcohol.  Do  not share personal items.  Notify your provider: if you are in close contact with someone who has COVID  or if you develop a fever of 100.4 or greater, sneezing, cough, sore throat, shortness of breath or body aches.             Do not wear jewelry  Do not wear lotions, powders, colognes, or deodorant. Do not shave 48 hours prior to surgery.  Men may shave face and neck. Do not bring valuables to the hospital. DO Not wear nail polish, gel polish, artificial nails, or any other type of covering on natural nails including  finger and toenails. If patients have artificial nails, gel coating, etc. that need to be removed by a nail salon, please have this removed prior to surgery or surgery may need to be canceled/delayed if the surgeon/ anesthesia feels like the patient is unable to be adequately monitored.             [facility] is not responsible for any belongings or valuables.  Do NOT Smoke (Tobacco/Vaping)  24 hours prior to your procedure  If you use a CPAP at night, you may bring your mask for your overnight stay.   Contacts, glasses, hearing aids, dentures or partials may not be worn into surgery, please bring cases for these belongings   For patients admitted to the hospital, discharge time will be determined by your treatment team.   Patients discharged the day of surgery will not be allowed to drive home, and someone needs to stay with them for 24 hours.  NO VISITORS WILL BE ALLOWED IN PRE-OP WHERE PATIENTS ARE PREPPED FOR SURGERY.  ONLY 1 SUPPORT PERSON MAY BE PRESENT IN THE WAITING ROOM WHILE YOU ARE IN SURGERY.  IF YOU ARE TO BE ADMITTED, ONCE YOU ARE IN YOUR ROOM YOU WILL BE ALLOWED TWO (2) VISITORS. 1 (ONE) VISITOR MAY STAY OVERNIGHT BUT MUST ARRIVE TO THE ROOM BY 8pm.  Minor children may have two parents present. Special consideration for safety and communication needs will be reviewed on a case by case basis.  Special instructions:    Oral Hygiene is also important to reduce your risk of infection.  Remember - BRUSH YOUR TEETH THE MORNING OF SURGERY WITH YOUR REGULAR TOOTHPASTE   [place]- Preparing For Surgery  Before surgery, you can play an important role. Because skin is not sterile, your skin needs to be as free of germs as possible. You can reduce the number of germs on your skin by washing with CHG (chlorahexidine gluconate) Soap before surgery.  CHG is an antiseptic cleaner which kills germs and bonds with the skin to continue killing germs even after washing.     Please do not  use if you have an allergy to CHG or antibacterial soaps. If your skin becomes reddened/irritated stop using the CHG.  Do not shave (including legs and underarms) for at least 48 hours prior to first CHG shower. It is OK to shave your face.  Please follow these instructions carefully.     Shower the NIGHT BEFORE SURGERY and the MORNING OF SURGERY with CHG Soap.   If you chose to wash your hair, wash your hair first as usual with your normal shampoo. After you shampoo, rinse your hair and body thoroughly to remove the shampoo.  Then [employer] and genitals (private parts) with your normal soap and rinse thoroughly to remove soap.  After that Use CHG Soap as you would any other liquid soap.  You can apply CHG directly to the skin and wash gently with a scrungie or a clean washcloth.   Apply the CHG Soap to your body ONLY FROM THE NECK DOWN.  Do not use on open wounds or open sores. Avoid contact with your eyes, ears, mouth and genitals (private parts). Wash Face and genitals (private parts)  with your normal soap.   Wash thoroughly, paying special attention to the area where your surgery will be performed.  Thoroughly rinse your body with warm water from the neck down.  DO NOT shower/wash with your normal soap after using and rinsing off the CHG Soap.  Pat yourself dry with a CLEAN TOWEL.  Wear CLEAN PAJAMAS to bed the night before surgery  Place CLEAN SHEETS on your bed the night before your surgery  DO NOT SLEEP WITH PETS.   Day of Surgery:  Take a shower with CHG soap. Wear Clean/Comfortable clothing the morning of surgery Do not apply any deodorants/lotions.   Remember to brush your teeth WITH YOUR REGULAR TOOTHPASTE.   Please read over the following fact sheets that you were given.

## 2021-08-03 ENCOUNTER — Encounter (HOSPITAL_COMMUNITY): Payer: Self-pay

## 2021-08-03 ENCOUNTER — Other Ambulatory Visit: Payer: Self-pay

## 2021-08-03 ENCOUNTER — Ambulatory Visit (HOSPITAL_COMMUNITY)
Admission: RE | Admit: 2021-08-03 | Discharge: 2021-08-03 | Disposition: A | Discharge status: Home / Self Care | Payer: Medicare Other | Source: Ambulatory Visit | Attending: Thoracic Surgery (Cardiothoracic Vascular Surgery) | Admitting: Thoracic Surgery (Cardiothoracic Vascular Surgery)

## 2021-08-03 ENCOUNTER — Encounter (HOSPITAL_COMMUNITY)
Admission: RE | Admit: 2021-08-03 | Discharge: 2021-08-03 | Disposition: A | Discharge status: Home / Self Care | Payer: Medicare Other | Source: Ambulatory Visit | Attending: Thoracic Surgery (Cardiothoracic Vascular Surgery) | Admitting: Thoracic Surgery (Cardiothoracic Vascular Surgery)

## 2021-08-03 VITALS — BP 125/82 | HR 57 | Temp 97.9°F | Resp 19 | Ht 72.0 in | Wt 236.0 lb

## 2021-08-03 DIAGNOSIS — R918 Other nonspecific abnormal finding of lung field: Secondary | ICD-10-CM | POA: Insufficient documentation

## 2021-08-03 DIAGNOSIS — G4730 Sleep apnea, unspecified: Secondary | ICD-10-CM | POA: Diagnosis not present

## 2021-08-03 DIAGNOSIS — E1159 Type 2 diabetes mellitus with other circulatory complications: Secondary | ICD-10-CM | POA: Insufficient documentation

## 2021-08-03 DIAGNOSIS — J939 Pneumothorax, unspecified: Secondary | ICD-10-CM | POA: Diagnosis not present

## 2021-08-03 DIAGNOSIS — R911 Solitary pulmonary nodule: Secondary | ICD-10-CM

## 2021-08-03 DIAGNOSIS — I2510 Atherosclerotic heart disease of native coronary artery without angina pectoris: Secondary | ICD-10-CM | POA: Insufficient documentation

## 2021-08-03 DIAGNOSIS — C3411 Malignant neoplasm of upper lobe, right bronchus or lung: Secondary | ICD-10-CM | POA: Insufficient documentation

## 2021-08-03 DIAGNOSIS — Z7982 Long term (current) use of aspirin: Secondary | ICD-10-CM | POA: Diagnosis not present

## 2021-08-03 DIAGNOSIS — R5383 Other fatigue: Secondary | ICD-10-CM | POA: Insufficient documentation

## 2021-08-03 DIAGNOSIS — Z20822 Contact with and (suspected) exposure to covid-19: Secondary | ICD-10-CM | POA: Diagnosis not present

## 2021-08-03 DIAGNOSIS — Z01818 Encounter for other preprocedural examination: Secondary | ICD-10-CM | POA: Insufficient documentation

## 2021-08-03 DIAGNOSIS — I480 Paroxysmal atrial fibrillation: Secondary | ICD-10-CM | POA: Insufficient documentation

## 2021-08-03 DIAGNOSIS — I10 Essential (primary) hypertension: Secondary | ICD-10-CM | POA: Insufficient documentation

## 2021-08-03 DIAGNOSIS — E785 Hyperlipidemia, unspecified: Secondary | ICD-10-CM | POA: Diagnosis not present

## 2021-08-03 DIAGNOSIS — R0609 Other forms of dyspnea: Secondary | ICD-10-CM | POA: Insufficient documentation

## 2021-08-03 DIAGNOSIS — Z5181 Encounter for therapeutic drug level monitoring: Secondary | ICD-10-CM | POA: Insufficient documentation

## 2021-08-03 DIAGNOSIS — Z951 Presence of aortocoronary bypass graft: Secondary | ICD-10-CM | POA: Insufficient documentation

## 2021-08-03 DIAGNOSIS — I7121 Aneurysm of the ascending aorta, without rupture: Secondary | ICD-10-CM | POA: Diagnosis not present

## 2021-08-03 DIAGNOSIS — J439 Emphysema, unspecified: Secondary | ICD-10-CM | POA: Diagnosis not present

## 2021-08-03 DIAGNOSIS — G4733 Obstructive sleep apnea (adult) (pediatric): Secondary | ICD-10-CM | POA: Insufficient documentation

## 2021-08-03 DIAGNOSIS — E119 Type 2 diabetes mellitus without complications: Secondary | ICD-10-CM | POA: Diagnosis not present

## 2021-08-03 DIAGNOSIS — Z7984 Long term (current) use of oral hypoglycemic drugs: Secondary | ICD-10-CM | POA: Diagnosis not present

## 2021-08-03 DIAGNOSIS — I252 Old myocardial infarction: Secondary | ICD-10-CM | POA: Insufficient documentation

## 2021-08-03 DIAGNOSIS — Z902 Acquired absence of lung [part of]: Secondary | ICD-10-CM | POA: Diagnosis not present

## 2021-08-03 DIAGNOSIS — J9811 Atelectasis: Secondary | ICD-10-CM | POA: Diagnosis not present

## 2021-08-03 DIAGNOSIS — Z87891 Personal history of nicotine dependence: Secondary | ICD-10-CM | POA: Diagnosis not present

## 2021-08-03 DIAGNOSIS — K5909 Other constipation: Secondary | ICD-10-CM | POA: Diagnosis not present

## 2021-08-03 DIAGNOSIS — Z79899 Other long term (current) drug therapy: Secondary | ICD-10-CM | POA: Diagnosis not present

## 2021-08-03 DIAGNOSIS — Z809 Family history of malignant neoplasm, unspecified: Secondary | ICD-10-CM | POA: Diagnosis not present

## 2021-08-03 DIAGNOSIS — Z8249 Family history of ischemic heart disease and other diseases of the circulatory system: Secondary | ICD-10-CM | POA: Diagnosis not present

## 2021-08-03 HISTORY — DX: Angina pectoris, unspecified: I20.9

## 2021-08-03 HISTORY — DX: Cardiac arrhythmia, unspecified: I49.9

## 2021-08-03 LAB — URINALYSIS, ROUTINE W REFLEX MICROSCOPIC
Bilirubin Urine: NEGATIVE
Glucose, UA: >=500 mg/dL — AB
Hgb urine dipstick: NEGATIVE
Ketones, ur: NEGATIVE mg/dL
Leukocytes,Ua: NEGATIVE
Nitrite: NEGATIVE
Protein, ur: NEGATIVE mg/dL
Specific Gravity, Urine: 1.026 (ref 1.005–1.030)
pH: 7 (ref 5.0–8.0)

## 2021-08-03 LAB — COMPREHENSIVE METABOLIC PANEL
ALT: 33 U/L (ref 0–44)
AST: 32 U/L (ref 15–41)
Albumin: 3.9 g/dL (ref 3.5–5.0)
Alkaline Phosphatase: 58 U/L (ref 38–126)
Anion gap: 11 (ref 5–15)
BUN: 26 mg/dL — ABNORMAL HIGH (ref 8–23)
CO2: 18 mmol/L — ABNORMAL LOW (ref 22–32)
Calcium: 9.6 mg/dL (ref 8.9–10.3)
Chloride: 107 mmol/L (ref 98–111)
Creatinine, Ser: 1.15 mg/dL (ref 0.61–1.24)
GFR, Estimated: >60 mL/min (ref >60)
Glucose, Bld: 238 mg/dL — ABNORMAL HIGH (ref 70–99)
Potassium: 3.8 mmol/L (ref 3.5–5.1)
Sodium: 136 mmol/L (ref 135–145)
Total Bilirubin: 0.6 mg/dL (ref 0.3–1.2)
Total Protein: 6.6 g/dL (ref 6.5–8.1)

## 2021-08-03 LAB — TYPE AND SCREEN
ABO/RH(D): O POS
Antibody Screen: NEGATIVE

## 2021-08-03 LAB — CBC
HCT: 43.2 % (ref 39.0–52.0)
Hemoglobin: 14.8 g/dL (ref 13.0–17.0)
MCH: 32.3 pg (ref 26.0–34.0)
MCHC: 34.3 g/dL (ref 30.0–36.0)
MCV: 94.3 fL (ref 80.0–100.0)
Platelets: 181 10*3/uL (ref 150–400)
RBC: 4.58 MIL/uL (ref 4.22–5.81)
RDW: 12.7 % (ref 11.5–15.5)
WBC: 5.5 10*3/uL (ref 4.0–10.5)
nRBC: 0 % (ref 0.0–0.2)

## 2021-08-03 LAB — HEMOGLOBIN A1C
Hgb A1c MFr Bld: 7.7 % — ABNORMAL HIGH (ref 4.8–5.6)
Mean Plasma Glucose: 174.29 mg/dL

## 2021-08-03 LAB — BLOOD GAS, ARTERIAL
Acid-base deficit: 2.1 mmol/L — ABNORMAL HIGH (ref 0.0–2.0)
Bicarbonate: 21.8 mmol/L (ref 20.0–28.0)
FIO2: 21
O2 Saturation: 98 %
Patient temperature: 37
pCO2 arterial: 34.5 mmHg (ref 32.0–48.0)
pH, Arterial: 7.416 (ref 7.350–7.450)
pO2, Arterial: 112 mmHg — ABNORMAL HIGH (ref 83.0–108.0)

## 2021-08-03 LAB — PROTIME-INR
INR: 1.2 (ref 0.8–1.2)
Prothrombin Time: 14.8 seconds (ref 11.4–15.2)

## 2021-08-03 LAB — SURGICAL PCR SCREEN
MRSA, PCR: NEGATIVE
Staphylococcus aureus: POSITIVE — AB

## 2021-08-03 LAB — SARS CORONAVIRUS 2 (TAT 6-24 HRS): SARS Coronavirus 2: NEGATIVE

## 2021-08-03 LAB — APTT: aPTT: 34 seconds (ref 24–36)

## 2021-08-03 LAB — GLUCOSE, CAPILLARY: Glucose-Capillary: 195 mg/dL — ABNORMAL HIGH (ref 70–99)

## 2021-08-03 NOTE — Progress Notes (Signed)
PCP - Dr. Janett Billow [place] Cardiologist - Dr. Rinaldo Cloud  Chest x-ray - 08/03/21 EKG - 08/03/21 Stress Test - 07/12/21 ECHO - 04/17/17 Coronary angioplasty - multiple Cardiac Cath -   Sleep Study - Yes doesn't have OSA CPAP - No  Dm- Type II Blood Sugar at PAT appt was 195 Checks Blood Sugar Never  Aspirin Instructions: Requested patient call Dr. Roxan Hockey regarding when to stop aspirin.  COVID TEST- 08/03/21   Anesthesia review:  Yes has cardiac history  Patient denies shortness of breath, fever, cough and chest pain at PAT appointment   All instructions explained to the patient, with a verbal understanding of the material. Patient agrees to go over the instructions while at home for a better understanding. Patient also instructed to wear a mask while in public after being tested for COVID-19. The opportunity to ask questions was provided.

## 2021-08-03 NOTE — Progress Notes (Signed)
LVM for Genia Del., RN with abnormal UA results.

## 2021-08-04 NOTE — Progress Notes (Signed)
Anesthesia Chart Review:  Follows with cardiology for history of HTN, HLD, OSA (not on CPAP), thoracic aortic aneurysm, CAD s/p CABG 2004, PAF (on appendix surgery, not on anticoagulation). CTA 05/19/2021 stable mild aneurysm of the ascending thoracic aorta 4.1 cm.  Nuclear stress 04/2019 with EF 47%, inferior wall hypokinesis, medium defect of moderate severity in the inferior distribution consistent with prior inferior MI, no ischemia, intermediate risk.  2D echo 04/17/2017 normal LVEF 50 to 55%. Last seen 07/06/2021 for preop clearance.  Per note, "Preoperative clearance for lung cancer surgical date and surgeon undetermined.  Patient has chronic dyspnea on exertion that is unchanged and fatigue at the end of the day.  He denies any chest pain or angina.  NST 2018 with out ischemia.  With upcoming surgery we will update Lexiscan Myoview prior to surgery. According to the Revised Cardiac Risk Index (RCRI), his Perioperative Risk of Major Cardiac Event is (%): 6.6. His Functional Capacity in METs is: 7.59 according to the Duke Activity Status Index (DASI)."  Nuclear stress 07/12/2021 showed findings consistent with prior MI, no ischemia, EF 51%, low risk study.  Patient cleared for surgery based on results.  Patient has been followed for groundglass opacity in right upper lobe for several years.  Over time there has been slow growth and slight increase in density.  Per pulmonology notes, clinical picture consistent with slow-growing adenocarcinoma.  He was referred to cardiothoracic surgery for resection.  DM2, not on insulin.  Preop A1c 7.7.  Remainder of preop labs unremarkable.  EKG 07/06/2021: Sinus bradycardia.  Rate 57.  First-degree AV block.  Inferior infarct.  Unchanged from prior.  CHEST - 2 VIEW 08/03/2021: COMPARISON:  10/08/2017, PET-CT 06/23/2021   FINDINGS: Cardiomediastinal silhouette unchanged in size and contour. Surgical changes of prior median sternotomy and CABG.   No  pneumothorax pleural effusion or confluent airspace disease.   The previously described ground-glass opacity of the right lung is not well visualized on the plain film.   No displaced fracture.  Degenerative changes of the spine   IMPRESSION: Negative for acute cardiopulmonary disease   CTA chest aorta 05/19/2021: IMPRESSION: 1. Stable mild aneurysmal disease of the ascending thoracic aorta measuring up to 4.1 cm in estimated maximal diameter. Recommend annual imaging followup by CTA or MRA. This recommendation follows 2010 ACCF/AHA/AATS/ACR/ASA/SCA/SCAI/SIR/STS/SVM Guidelines for the Diagnosis and Management of Patients with Thoracic Aortic Disease. Circulation. 2010; 121: P382-N053. Aortic aneurysm NOS (ICD10-I71.9) 2. Enlarging ground-glass opacity in the posterior right upper lobe which remains difficult to measure and does not have a definitive solid nodular component. Region of maximal extent now measures roughly 18-19 mm compared to 15 mm previously. Recommend further multi disciplinary thoracic oncologic referral ([place]) for further workup and consideration of resection versus biopsy. At the size of this current abnormality, a PET scan may also be useful for further workup. 3. Cholelithiasis.  Nuclear stress 07/12/2021: Fixed inferior perfusion defect with inferior hypokinesis, consistent with infarct Mild systolic dysfunction (EF 97%) Low risk study.  No ischemia  TTE 04/17/2017: - Left ventricle: The cavity size was normal. Wall thickness was    increased in a pattern of mild LVH. Systolic function was normal.    The estimated ejection fraction was in the range of 50% to 55%.    Mild hypokinesis of the inferior myocardium.  - Aortic root: The aortic root was mildly dilated.  - Left atrium: The atrium was mildly to moderately dilated.     Karoline Caldwell, PA-C [facility] Short Stay  Center/Anesthesiology Phone [phone] 08/04/2021 9:43 AM

## 2021-08-04 NOTE — Anesthesia Preprocedure Evaluation (Addendum)
Anesthesia Evaluation  Patient identified by MRN, date of birth, ID band Patient awake    Reviewed: Allergy & Precautions, NPO status , Patient's Chart, lab work & pertinent test results  History of Anesthesia Complications Negative for: history of anesthetic complications  Airway Mallampati: II  TM Distance: >3 FB Neck ROM: Full    Dental no notable dental hx.    Pulmonary neg pulmonary ROS, sleep apnea , former smoker,  Lung cancer   Pulmonary exam normal breath sounds clear to auscultation       Cardiovascular hypertension, Pt. on medications and Pt. on home beta blockers + CAD, + Past MI, + CABG (2004) and + Peripheral Vascular Disease  Normal cardiovascular exam+ dysrhythmias Atrial Fibrillation  Rhythm:Regular Rate:Normal  TAA 4.1 cm- stable   Neuro/Psych negative neurological ROS  negative psych ROS   GI/Hepatic negative GI ROS, Neg liver ROS,   Endo/Other  diabetes, Type 2, Oral Hypoglycemic Agents  Renal/GU negative Renal ROS  negative genitourinary   Musculoskeletal negative musculoskeletal ROS (+)   Abdominal   Peds negative pediatric ROS (+)  Hematology negative hematology ROS (+)   Anesthesia Other Findings   Reproductive/Obstetrics negative OB ROS                           Anesthesia Physical Anesthesia Plan  ASA: 3  Anesthesia Plan: General   Post-op Pain Management: Tylenol PO (pre-op) and Toradol IV (intra-op)   Induction: Intravenous  PONV Risk Score and Plan: 2 and Ondansetron, Dexamethasone, Treatment may vary due to age or medical condition and Midazolam  Airway Management Planned: Double Lumen EBT  Additional Equipment: Arterial line  Intra-op Plan:   Post-operative Plan: Extubation in OR  Informed Consent:   Plan Discussed with: CRNA and Surgeon  Anesthesia Plan Comments: (PAT note by Karoline Caldwell, PA-C:  Follows with cardiology for history of  HTN, HLD, OSA (not on CPAP), thoracic aortic aneurysm, CAD s/p CABG 2004, PAF (on appendix surgery, not on anticoagulation). CTA 05/19/2021 stable mild aneurysm of the ascending thoracic aorta 4.1 cm.  Nuclear stress 04/2019 with EF 47%, inferior wall hypokinesis, medium defect of moderate severity in the inferior distribution consistent with prior inferior MI, no ischemia, intermediate risk.  2D echo 04/17/2017 normal LVEF 50 to 55%. Last seen 07/06/2021 for preop clearance.  Per note, "Preoperative clearance for lung cancer surgical date and surgeon undetermined.Patient has chronic dyspnea on exertion that is unchanged and fatigue at the end of the day. He denies any chest pain or angina. [id number] with out ischemia. With upcoming surgery we will update Lexiscan Myoview prior to surgery. According to the Revised Cardiac Risk Index (RCRI),hisPerioperative Risk of Major Cardiac Event is (%): 6.6. HisFunctional Capacity in METs is: 7.59according to the Duke Activity Status Index (DASI)."  Nuclear stress 07/12/2021 showed findings consistent with prior MI, no ischemia, EF 51%, low risk study.  Patient cleared for surgery based on results.  Patient has been followed for groundglass opacity in right upper lobe for several years.  Over time there has been slow growth and slight increase in density.  Per pulmonology notes, clinical picture consistent with slow-growing adenocarcinoma.  He was referred to cardiothoracic surgery for resection.  DM2, not on insulin.  Preop A1c 7.7.  Remainder of preop labs unremarkable.  EKG 07/06/2021: Sinus bradycardia.  Rate 57.  First-degree AV block.  Inferior infarct.  Unchanged from prior.  CHEST - 2 VIEW 08/03/2021: COMPARISON: 10/08/2017, PET-CT 06/23/2021  FINDINGS: Cardiomediastinal silhouette unchanged in size and contour. Surgical changes of prior median sternotomy and CABG.  No pneumothorax pleural effusion or confluent airspace disease.  The  previously described ground-glass opacity of the right lung is not well visualized on the plain film.  No displaced fracture. Degenerative changes of the spine  IMPRESSION: Negative for acute cardiopulmonary disease  CTA chest aorta 05/19/2021: IMPRESSION: 1. Stable mild aneurysmal disease of the ascending thoracic aorta measuring up to 4.1 cm in estimated maximal diameter. Recommend annual imaging followup by CTA or MRA. This recommendation follows 2010 ACCF/AHA/AATS/ACR/ASA/SCA/SCAI/SIR/STS/SVM Guidelines for the Diagnosis and Management of Patients with Thoracic Aortic Disease. Circulation. 2010; 121: O536-U440. Aortic aneurysm NOS (ICD10-I71.9) 2. Enlarging ground-glass opacity in the posterior right upper lobe which remains difficult to measure and does not have a definitive solid nodular component. Region of maximal extent now measures roughly 18-19 mm compared to 15 mm previously. Recommend further multi disciplinary thoracic oncologic referral ([place]) for further workup and consideration of resection versus biopsy. At the size of this current abnormality, a PET scan may also be useful for further workup. 3. Cholelithiasis.  Nuclear stress 07/12/2021: 1. Fixed inferior perfusion defect with inferior hypokinesis, consistent with infarct 2. Mild systolic dysfunction (EF 34%) 3. Low risk study. No ischemia  TTE 04/17/2017: - Left ventricle: The cavity size was normal. Wall thickness was  increased in a pattern of mild LVH. Systolic function was normal.  The estimated ejection fraction was in the range of 50% to 55%.  Mild hypokinesis of the inferior myocardium.  - Aortic root: The aortic root was mildly dilated.  - Left atrium: The atrium was mildly to moderately dilated.  )      Anesthesia Quick Evaluation

## 2021-08-05 ENCOUNTER — Inpatient Hospital Stay (HOSPITAL_COMMUNITY): Payer: Medicare Other | Admitting: Anesthesiology

## 2021-08-05 ENCOUNTER — Encounter (HOSPITAL_COMMUNITY): Payer: Self-pay | Admitting: Thoracic Surgery (Cardiothoracic Vascular Surgery)

## 2021-08-05 ENCOUNTER — Inpatient Hospital Stay (HOSPITAL_COMMUNITY): Payer: Medicare Other

## 2021-08-05 ENCOUNTER — Encounter (HOSPITAL_COMMUNITY)
Admission: RE | Disposition: A | Discharge status: Home / Self Care | Payer: Self-pay | Source: Home / Self Care | Attending: Thoracic Surgery (Cardiothoracic Vascular Surgery)

## 2021-08-05 ENCOUNTER — Inpatient Hospital Stay (HOSPITAL_COMMUNITY): Payer: Medicare Other | Admitting: Physician Assistant

## 2021-08-05 ENCOUNTER — Inpatient Hospital Stay (HOSPITAL_COMMUNITY)
Admission: RE | Admit: 2021-08-05 | Discharge: 2021-08-07 | DRG: 165 | Disposition: A | Discharge status: Home / Self Care | Payer: Medicare Other | Attending: Thoracic Surgery (Cardiothoracic Vascular Surgery) | Admitting: Thoracic Surgery (Cardiothoracic Vascular Surgery)

## 2021-08-05 DIAGNOSIS — K5909 Other constipation: Secondary | ICD-10-CM | POA: Diagnosis not present

## 2021-08-05 DIAGNOSIS — Z7984 Long term (current) use of oral hypoglycemic drugs: Secondary | ICD-10-CM | POA: Diagnosis not present

## 2021-08-05 DIAGNOSIS — R911 Solitary pulmonary nodule: Secondary | ICD-10-CM | POA: Diagnosis present

## 2021-08-05 DIAGNOSIS — Z87891 Personal history of nicotine dependence: Secondary | ICD-10-CM

## 2021-08-05 DIAGNOSIS — Z8249 Family history of ischemic heart disease and other diseases of the circulatory system: Secondary | ICD-10-CM | POA: Diagnosis not present

## 2021-08-05 DIAGNOSIS — I10 Essential (primary) hypertension: Secondary | ICD-10-CM | POA: Diagnosis present

## 2021-08-05 DIAGNOSIS — Z951 Presence of aortocoronary bypass graft: Secondary | ICD-10-CM | POA: Diagnosis not present

## 2021-08-05 DIAGNOSIS — Z79899 Other long term (current) drug therapy: Secondary | ICD-10-CM | POA: Diagnosis not present

## 2021-08-05 DIAGNOSIS — C3411 Malignant neoplasm of upper lobe, right bronchus or lung: Principal | ICD-10-CM | POA: Diagnosis present

## 2021-08-05 DIAGNOSIS — Z20822 Contact with and (suspected) exposure to covid-19: Secondary | ICD-10-CM | POA: Diagnosis present

## 2021-08-05 DIAGNOSIS — I480 Paroxysmal atrial fibrillation: Secondary | ICD-10-CM | POA: Diagnosis not present

## 2021-08-05 DIAGNOSIS — E119 Type 2 diabetes mellitus without complications: Secondary | ICD-10-CM | POA: Diagnosis not present

## 2021-08-05 DIAGNOSIS — J939 Pneumothorax, unspecified: Secondary | ICD-10-CM | POA: Diagnosis not present

## 2021-08-05 DIAGNOSIS — J439 Emphysema, unspecified: Secondary | ICD-10-CM | POA: Diagnosis not present

## 2021-08-05 DIAGNOSIS — R918 Other nonspecific abnormal finding of lung field: Secondary | ICD-10-CM | POA: Diagnosis not present

## 2021-08-05 DIAGNOSIS — Z902 Acquired absence of lung [part of]: Secondary | ICD-10-CM

## 2021-08-05 DIAGNOSIS — Z7982 Long term (current) use of aspirin: Secondary | ICD-10-CM

## 2021-08-05 DIAGNOSIS — I252 Old myocardial infarction: Secondary | ICD-10-CM | POA: Diagnosis not present

## 2021-08-05 DIAGNOSIS — J9811 Atelectasis: Secondary | ICD-10-CM | POA: Diagnosis not present

## 2021-08-05 DIAGNOSIS — I7121 Aneurysm of the ascending aorta, without rupture: Secondary | ICD-10-CM | POA: Diagnosis present

## 2021-08-05 DIAGNOSIS — E785 Hyperlipidemia, unspecified: Secondary | ICD-10-CM | POA: Diagnosis not present

## 2021-08-05 DIAGNOSIS — I2510 Atherosclerotic heart disease of native coronary artery without angina pectoris: Secondary | ICD-10-CM | POA: Diagnosis not present

## 2021-08-05 DIAGNOSIS — Z809 Family history of malignant neoplasm, unspecified: Secondary | ICD-10-CM | POA: Diagnosis not present

## 2021-08-05 DIAGNOSIS — G4730 Sleep apnea, unspecified: Secondary | ICD-10-CM | POA: Diagnosis present

## 2021-08-05 DIAGNOSIS — Z09 Encounter for follow-up examination after completed treatment for conditions other than malignant neoplasm: Secondary | ICD-10-CM

## 2021-08-05 HISTORY — PX: INTERCOSTAL NERVE BLOCK: SHX5021

## 2021-08-05 HISTORY — PX: XI ROBOTIC ASSISTED THORACOSCOPY- SEGMENTECTOMY: SHX6881

## 2021-08-05 HISTORY — PX: LYMPH NODE DISSECTION: SHX5087

## 2021-08-05 LAB — GLUCOSE, CAPILLARY
Glucose-Capillary: 157 mg/dL — ABNORMAL HIGH (ref 70–99)
Glucose-Capillary: 194 mg/dL — ABNORMAL HIGH (ref 70–99)
Glucose-Capillary: 172 mg/dL — ABNORMAL HIGH (ref 70–99)
Glucose-Capillary: 187 mg/dL — ABNORMAL HIGH (ref 70–99)

## 2021-08-05 LAB — ABO/RH: ABO/RH(D): O POS

## 2021-08-05 SURGERY — RESECTION, LUNG, SEGMENTAL, ROBOT-ASSISTED
Anesthesia: General | Site: Chest | Laterality: Right

## 2021-08-05 MED ORDER — SUGAMMADEX SODIUM 200 MG/2ML IV SOLN
INTRAVENOUS | Status: DC | PRN
  Administered 2021-08-05: 200 mg via INTRAVENOUS

## 2021-08-05 MED ORDER — PROPOFOL 10 MG/ML IV BOLUS
INTRAVENOUS | Status: AC
  Filled 2021-08-05: qty 20

## 2021-08-05 MED ORDER — ACETAMINOPHEN 500 MG PO TABS
1000.0000 mg | ORAL_TABLET | Freq: Once | ORAL | Status: AC
  Administered 2021-08-05: 06:00:00 1000 mg via ORAL
  Filled 2021-08-05: qty 2

## 2021-08-05 MED ORDER — DEXAMETHASONE SODIUM PHOSPHATE 10 MG/ML IJ SOLN
INTRAMUSCULAR | Status: DC | PRN
  Administered 2021-08-05: 10 mg via INTRAVENOUS

## 2021-08-05 MED ORDER — SODIUM CHLORIDE 0.9 % IR SOLN
Status: DC | PRN
  Administered 2021-08-05: 1000 mL

## 2021-08-05 MED ORDER — PROPOFOL 10 MG/ML IV BOLUS
INTRAVENOUS | Status: DC | PRN
  Administered 2021-08-05: 200 mg via INTRAVENOUS

## 2021-08-05 MED ORDER — HYDROMORPHONE HCL 1 MG/ML IJ SOLN: 0.2500 mg | INTRAMUSCULAR | Status: DC | PRN

## 2021-08-05 MED ORDER — MIDAZOLAM HCL 2 MG/2ML IJ SOLN
INTRAMUSCULAR | Status: DC | PRN
  Administered 2021-08-05: 2 mg via INTRAVENOUS

## 2021-08-05 MED ORDER — FENOFIBRATE 54 MG PO TABS
54.0000 mg | ORAL_TABLET | Freq: Every day | ORAL | Status: DC
  Administered 2021-08-06 – 2021-08-07 (×2): 54 mg via ORAL
  Filled 2021-08-05 (×3): qty 1

## 2021-08-05 MED ORDER — INDOCYANINE GREEN 25 MG IV SOLR
INTRAVENOUS | Status: DC | PRN
  Administered 2021-08-05: 25 mg via INTRAVENOUS

## 2021-08-05 MED ORDER — PHENYLEPHRINE HCL-NACL 20-0.9 MG/250ML-% IV SOLN
INTRAVENOUS | Status: DC | PRN
  Administered 2021-08-05: 50 ug/min via INTRAVENOUS

## 2021-08-05 MED ORDER — ACETAMINOPHEN 160 MG/5ML PO SOLN
1000.0000 mg | Freq: Four times a day (QID) | ORAL | Status: DC
  Filled 2021-08-05: qty 40.6

## 2021-08-05 MED ORDER — INSULIN ASPART 100 UNIT/ML IJ SOLN
0.0000 [IU] | INTRAMUSCULAR | Status: DC
  Administered 2021-08-05 (×2): 4 [IU] via SUBCUTANEOUS
  Administered 2021-08-06 (×3): 2 [IU] via SUBCUTANEOUS

## 2021-08-05 MED ORDER — LIDOCAINE 2% (20 MG/ML) 5 ML SYRINGE
INTRAMUSCULAR | Status: DC | PRN
  Administered 2021-08-05: 100 mg via INTRAVENOUS

## 2021-08-05 MED ORDER — ONDANSETRON HCL 4 MG/2ML IJ SOLN
INTRAMUSCULAR | Status: AC
  Filled 2021-08-05: qty 2

## 2021-08-05 MED ORDER — ONDANSETRON HCL 4 MG/2ML IJ SOLN: 4.0000 mg | Freq: Four times a day (QID) | INTRAMUSCULAR | Status: DC | PRN

## 2021-08-05 MED ORDER — MIDAZOLAM HCL 2 MG/2ML IJ SOLN
INTRAMUSCULAR | Status: AC
  Filled 2021-08-05: qty 2

## 2021-08-05 MED ORDER — ONDANSETRON HCL 4 MG/2ML IJ SOLN: 4.0000 mg | Freq: Once | INTRAMUSCULAR | Status: DC | PRN

## 2021-08-05 MED ORDER — LISINOPRIL 10 MG PO TABS
10.0000 mg | ORAL_TABLET | Freq: Every day | ORAL | Status: DC
  Administered 2021-08-06 – 2021-08-07 (×2): 10 mg via ORAL
  Filled 2021-08-05 (×2): qty 1

## 2021-08-05 MED ORDER — SODIUM CHLORIDE 0.45 % IV SOLN: INTRAVENOUS | Status: DC

## 2021-08-05 MED ORDER — OXYCODONE HCL 5 MG PO TABS: 5.0000 mg | ORAL_TABLET | Freq: Once | ORAL | Status: DC | PRN

## 2021-08-05 MED ORDER — MORPHINE SULFATE (PF) 2 MG/ML IV SOLN: 2.0000 mg | INTRAVENOUS | Status: DC | PRN

## 2021-08-05 MED ORDER — LACTATED RINGERS IV SOLN: INTRAVENOUS | Status: DC

## 2021-08-05 MED ORDER — CHLORHEXIDINE GLUCONATE 0.12 % MT SOLN
15.0000 mL | Freq: Once | OROMUCOSAL | Status: AC
  Administered 2021-08-05: 06:00:00 15 mL via OROMUCOSAL
  Filled 2021-08-05: qty 15

## 2021-08-05 MED ORDER — ACETAMINOPHEN 500 MG PO TABS
1000.0000 mg | ORAL_TABLET | Freq: Four times a day (QID) | ORAL | Status: DC
  Administered 2021-08-05 – 2021-08-07 (×6): 1000 mg via ORAL
  Filled 2021-08-05 (×7): qty 2

## 2021-08-05 MED ORDER — ROCURONIUM BROMIDE 10 MG/ML (PF) SYRINGE
PREFILLED_SYRINGE | INTRAVENOUS | Status: DC | PRN
  Administered 2021-08-05: 50 mg via INTRAVENOUS
  Administered 2021-08-05: 30 mg via INTRAVENOUS
  Administered 2021-08-05: 70 mg via INTRAVENOUS

## 2021-08-05 MED ORDER — 0.9 % SODIUM CHLORIDE (POUR BTL) OPTIME
TOPICAL | Status: DC | PRN
  Administered 2021-08-05: 08:00:00 2000 mL

## 2021-08-05 MED ORDER — ASPIRIN 81 MG PO CHEW
81.0000 mg | CHEWABLE_TABLET | Freq: Every day | ORAL | Status: DC
  Administered 2021-08-06 – 2021-08-07 (×2): 81 mg via ORAL
  Filled 2021-08-05 (×2): qty 1

## 2021-08-05 MED ORDER — DEXAMETHASONE SODIUM PHOSPHATE 10 MG/ML IJ SOLN
INTRAMUSCULAR | Status: AC
  Filled 2021-08-05: qty 1

## 2021-08-05 MED ORDER — FENTANYL CITRATE (PF) 250 MCG/5ML IJ SOLN
INTRAMUSCULAR | Status: DC | PRN
  Administered 2021-08-05 (×3): 50 ug via INTRAVENOUS
  Administered 2021-08-05: 100 ug via INTRAVENOUS

## 2021-08-05 MED ORDER — ORAL CARE MOUTH RINSE: 15.0000 mL | Freq: Once | OROMUCOSAL | Status: AC

## 2021-08-05 MED ORDER — BUPIVACAINE LIPOSOME 1.3 % IJ SUSP
INTRAMUSCULAR | Status: DC | PRN
  Administered 2021-08-05: 08:00:00 100 mL

## 2021-08-05 MED ORDER — OXYCODONE HCL 5 MG/5ML PO SOLN: 5.0000 mg | Freq: Once | ORAL | Status: DC | PRN

## 2021-08-05 MED ORDER — METOPROLOL SUCCINATE ER 50 MG PO TB24
50.0000 mg | ORAL_TABLET | Freq: Every day | ORAL | Status: DC
  Administered 2021-08-06 – 2021-08-07 (×2): 50 mg via ORAL
  Filled 2021-08-05 (×2): qty 1

## 2021-08-05 MED ORDER — GLYCOPYRROLATE 0.2 MG/ML IJ SOLN
INTRAMUSCULAR | Status: DC | PRN
  Administered 2021-08-05: .1 mg via INTRAVENOUS

## 2021-08-05 MED ORDER — SENNOSIDES-DOCUSATE SODIUM 8.6-50 MG PO TABS
1.0000 | ORAL_TABLET | Freq: Every day | ORAL | Status: DC
  Administered 2021-08-05 – 2021-08-06 (×2): 1 via ORAL
  Filled 2021-08-05 (×2): qty 1

## 2021-08-05 MED ORDER — CEFAZOLIN SODIUM-DEXTROSE 2-4 GM/100ML-% IV SOLN
2.0000 g | Freq: Three times a day (TID) | INTRAVENOUS | Status: AC
  Administered 2021-08-05 – 2021-08-06 (×2): 2 g via INTRAVENOUS
  Filled 2021-08-05 (×2): qty 100

## 2021-08-05 MED ORDER — CEFAZOLIN SODIUM-DEXTROSE 2-4 GM/100ML-% IV SOLN
2.0000 g | INTRAVENOUS | Status: AC
  Administered 2021-08-05: 08:00:00 2 g via INTRAVENOUS
  Filled 2021-08-05: qty 100

## 2021-08-05 MED ORDER — OXYCODONE HCL 5 MG PO TABS
5.0000 mg | ORAL_TABLET | ORAL | Status: DC | PRN
  Administered 2021-08-05 – 2021-08-07 (×6): 10 mg via ORAL
  Filled 2021-08-05 (×6): qty 2

## 2021-08-05 MED ORDER — FENTANYL CITRATE (PF) 250 MCG/5ML IJ SOLN
INTRAMUSCULAR | Status: AC
  Filled 2021-08-05: qty 5

## 2021-08-05 MED ORDER — ENOXAPARIN SODIUM 40 MG/0.4ML IJ SOSY
40.0000 mg | PREFILLED_SYRINGE | Freq: Every day | INTRAMUSCULAR | Status: DC
  Administered 2021-08-05 – 2021-08-06 (×2): 40 mg via SUBCUTANEOUS
  Filled 2021-08-05 (×2): qty 0.4

## 2021-08-05 MED ORDER — ONDANSETRON HCL 4 MG/2ML IJ SOLN
INTRAMUSCULAR | Status: DC | PRN
  Administered 2021-08-05: 4 mg via INTRAVENOUS

## 2021-08-05 MED ORDER — BUPIVACAINE LIPOSOME 1.3 % IJ SUSP
INTRAMUSCULAR | Status: AC
  Filled 2021-08-05: qty 20

## 2021-08-05 MED ORDER — EPHEDRINE SULFATE-NACL 50-0.9 MG/10ML-% IV SOSY
PREFILLED_SYRINGE | INTRAVENOUS | Status: DC | PRN
  Administered 2021-08-05: 5 mg via INTRAVENOUS

## 2021-08-05 MED ORDER — ROSUVASTATIN CALCIUM 5 MG PO TABS
10.0000 mg | ORAL_TABLET | Freq: Every day | ORAL | Status: DC
  Administered 2021-08-06 – 2021-08-07 (×2): 10 mg via ORAL
  Filled 2021-08-05 (×2): qty 2

## 2021-08-05 MED ORDER — KETOROLAC TROMETHAMINE 30 MG/ML IJ SOLN
INTRAMUSCULAR | Status: DC | PRN
  Administered 2021-08-05: 30 mg via INTRAVENOUS

## 2021-08-05 MED ORDER — BISACODYL 5 MG PO TBEC
10.0000 mg | DELAYED_RELEASE_TABLET | Freq: Every day | ORAL | Status: DC
  Administered 2021-08-06 – 2021-08-07 (×2): 10 mg via ORAL
  Filled 2021-08-05 (×2): qty 2

## 2021-08-05 MED ORDER — INDOCYANINE GREEN 25 MG IV SOLR
INTRAVENOUS | Status: AC
  Filled 2021-08-05: qty 10

## 2021-08-05 MED ORDER — BUPIVACAINE HCL (PF) 0.5 % IJ SOLN
INTRAMUSCULAR | Status: AC
  Filled 2021-08-05: qty 30

## 2021-08-05 SURGICAL SUPPLY — 118 items
APPLIER CLIP ROT 10 11.4 M/L (STAPLE)
BLADE CLIPPER SURG (BLADE) ×2 IMPLANT
BLADE SURG SZ11 CARB STEEL (BLADE) ×2 IMPLANT
BNDG COHESIVE 6X5 TAN STRL LF (GAUZE/BANDAGES/DRESSINGS) ×2 IMPLANT
CANISTER SUCT 3000ML PPV (MISCELLANEOUS) ×4 IMPLANT
CANNULA REDUC XI 12-8 STAPL (CANNULA) ×4
CANNULA REDUCER 12-8 DVNC XI (CANNULA) ×2 IMPLANT
CATH THORACIC 28FR (CATHETERS) IMPLANT
CATH THORACIC 28FR RT ANG (CATHETERS) IMPLANT
CATH THORACIC 36FR (CATHETERS) IMPLANT
CATH THORACIC 36FR RT ANG (CATHETERS) IMPLANT
CLIP APPLIE ROT 10 11.4 M/L (STAPLE) IMPLANT
CLIP VESOCCLUDE MED 6/CT (CLIP) IMPLANT
CNTNR URN SCR LID CUP LEK RST (MISCELLANEOUS) ×5 IMPLANT
CONN ST 1/4X3/8  BEN (MISCELLANEOUS)
CONN ST 1/4X3/8 BEN (MISCELLANEOUS) IMPLANT
CONN Y 3/8X3/8X3/8  BEN (MISCELLANEOUS)
CONN Y 3/8X3/8X3/8 BEN (MISCELLANEOUS) IMPLANT
CONT SPEC 4OZ STRL OR WHT (MISCELLANEOUS) ×26
DEFOGGER SCOPE WARMER CLEARIFY (MISCELLANEOUS) ×2 IMPLANT
DERMABOND ADVANCED (GAUZE/BANDAGES/DRESSINGS) ×1
DERMABOND ADVANCED .7 DNX12 (GAUZE/BANDAGES/DRESSINGS) ×1 IMPLANT
DRAIN CHANNEL 28F RND 3/8 FF (WOUND CARE) IMPLANT
DRAIN CHANNEL 32F RND 10.7 FF (WOUND CARE) IMPLANT
DRAPE ARM DVNC X/XI (DISPOSABLE) ×4 IMPLANT
DRAPE COLUMN DVNC XI (DISPOSABLE) ×1 IMPLANT
DRAPE CV SPLIT W-CLR ANES SCRN (DRAPES) ×2 IMPLANT
DRAPE DA VINCI XI ARM (DISPOSABLE) ×8
DRAPE DA VINCI XI COLUMN (DISPOSABLE) ×2
DRAPE HALF SHEET 40X57 (DRAPES) ×2 IMPLANT
DRAPE INCISE IOBAN 66X45 STRL (DRAPES) IMPLANT
DRAPE ORTHO SPLIT 77X108 STRL (DRAPES) ×2
DRAPE SURG ORHT 6 SPLT 77X108 (DRAPES) ×1 IMPLANT
ELECT BLADE 6.5 EXT (BLADE) IMPLANT
ELECT REM PT RETURN 9FT ADLT (ELECTROSURGICAL) ×2
ELECTRODE REM PT RTRN 9FT ADLT (ELECTROSURGICAL) ×1 IMPLANT
GAUZE KITTNER 4X5 RF (MISCELLANEOUS) ×5 IMPLANT
GAUZE SPONGE 4X4 12PLY STRL (GAUZE/BANDAGES/DRESSINGS) ×2 IMPLANT
GLOVE SURG MICRO LTX SZ7.5 (GLOVE) ×4 IMPLANT
GOWN STRL REUS W/ TWL LRG LVL3 (GOWN DISPOSABLE) ×2 IMPLANT
GOWN STRL REUS W/ TWL XL LVL3 (GOWN DISPOSABLE) ×3 IMPLANT
GOWN STRL REUS W/TWL 2XL LVL3 (GOWN DISPOSABLE) ×4 IMPLANT
GOWN STRL REUS W/TWL LRG LVL3 (GOWN DISPOSABLE) ×4
GOWN STRL REUS W/TWL XL LVL3 (GOWN DISPOSABLE) ×6
HEMOSTAT SURGICEL 2X14 (HEMOSTASIS) ×8 IMPLANT
IRRIGATION STRYKERFLOW (MISCELLANEOUS) ×1 IMPLANT
IRRIGATOR STRYKERFLOW (MISCELLANEOUS) ×2
KIT BASIN OR (CUSTOM PROCEDURE TRAY) ×2 IMPLANT
KIT SUCTION CATH 14FR (SUCTIONS) IMPLANT
KIT TURNOVER KIT B (KITS) ×2 IMPLANT
LOOP VESSEL SUPERMAXI WHITE (MISCELLANEOUS) IMPLANT
NDL HYPO 25GX1X1/2 BEV (NEEDLE) ×1 IMPLANT
NDL SPNL 22GX3.5 QUINCKE BK (NEEDLE) IMPLANT
NEEDLE HYPO 25GX1X1/2 BEV (NEEDLE) ×2 IMPLANT
NEEDLE SPNL 22GX3.5 QUINCKE BK (NEEDLE) IMPLANT
NS IRRIG 1000ML POUR BTL (IV SOLUTION) ×2 IMPLANT
PACK CHEST (CUSTOM PROCEDURE TRAY) ×2 IMPLANT
PAD ARMBOARD 7.5X6 YLW CONV (MISCELLANEOUS) ×4 IMPLANT
PORT ACCESS TROCAR AIRSEAL 12 (TROCAR) ×1 IMPLANT
PORT ACCESS TROCAR AIRSEAL 5M (TROCAR) ×1
RELOAD STAPLE 45 2.5 WHT DVNC (STAPLE) IMPLANT
RELOAD STAPLE 45 3.5 BLU DVNC (STAPLE) IMPLANT
RELOAD STAPLE 45 4.3 GRN DVNC (STAPLE) IMPLANT
RELOAD STAPLE 45 4.6 BLK DVNC (STAPLE) IMPLANT
RELOAD STAPLER 2.5X45 WHT DVNC (STAPLE) ×2 IMPLANT
RELOAD STAPLER 3.5X45 BLU DVNC (STAPLE) ×4 IMPLANT
RELOAD STAPLER 4.3X45 GRN DVNC (STAPLE) ×3 IMPLANT
RELOAD STAPLER 45 4.6 BLK DVNC (STAPLE) ×2 IMPLANT
SCISSORS LAP 5X35 DISP (ENDOMECHANICALS) IMPLANT
SEAL CANN UNIV 5-8 DVNC XI (MISCELLANEOUS) ×2 IMPLANT
SEAL XI 5MM-8MM UNIVERSAL (MISCELLANEOUS) ×4
SEALANT PROGEL (MISCELLANEOUS) IMPLANT
SEALANT SURG COSEAL 4ML (VASCULAR PRODUCTS) IMPLANT
SEALANT SURG COSEAL 8ML (VASCULAR PRODUCTS) IMPLANT
SET TRI-LUMEN FLTR TB AIRSEAL (TUBING) ×2 IMPLANT
SHEARS HARMONIC HDI 20CM (ELECTROSURGICAL) IMPLANT
SOLUTION ELECTROLUBE (MISCELLANEOUS) ×2 IMPLANT
SPONGE INTESTINAL PEANUT (DISPOSABLE) IMPLANT
SPONGE TONSIL TAPE 1 RFD (DISPOSABLE) IMPLANT
STAPLER 45 SUREFORM CVD (STAPLE) ×2
STAPLER 45 SUREFORM CVD DVNC (STAPLE) IMPLANT
STAPLER CANNULA SEAL DVNC XI (STAPLE) ×2 IMPLANT
STAPLER CANNULA SEAL XI (STAPLE) ×4
STAPLER RELOAD 2.5X45 WHITE (STAPLE) ×4
STAPLER RELOAD 2.5X45 WHT DVNC (STAPLE) ×2
STAPLER RELOAD 3.5X45 BLU DVNC (STAPLE) ×4
STAPLER RELOAD 3.5X45 BLUE (STAPLE) ×8
STAPLER RELOAD 4.3X45 GREEN (STAPLE) ×6
STAPLER RELOAD 4.3X45 GRN DVNC (STAPLE) ×3
STAPLER RELOAD 45 4.6 BLK (STAPLE) ×4
STAPLER RELOAD 45 4.6 BLK DVNC (STAPLE) ×2
SUT PDS AB 3-0 SH 27 (SUTURE) IMPLANT
SUT PROLENE 4 0 RB 1 (SUTURE)
SUT PROLENE 4-0 RB1 .5 CRCL 36 (SUTURE) IMPLANT
SUT SILK  1 MH (SUTURE) ×2
SUT SILK 1 MH (SUTURE) ×2 IMPLANT
SUT SILK 1 TIES 10X30 (SUTURE) ×2 IMPLANT
SUT SILK 2 0 SH (SUTURE) ×2 IMPLANT
SUT SILK 2 0SH CR/8 30 (SUTURE) IMPLANT
SUT SILK 3 0 SH 30 (SUTURE) IMPLANT
SUT SILK 3 0SH CR/8 30 (SUTURE) IMPLANT
SUT VIC AB 1 CTX 36 (SUTURE) ×2
SUT VIC AB 1 CTX36XBRD ANBCTR (SUTURE) IMPLANT
SUT VIC AB 2-0 CTX 36 (SUTURE) ×1 IMPLANT
SUT VIC AB 3-0 MH 27 (SUTURE) IMPLANT
SUT VIC AB 3-0 X1 27 (SUTURE) ×3 IMPLANT
SUT VICRYL 0 TIES 12 18 (SUTURE) ×2 IMPLANT
SUT VICRYL 0 UR6 27IN ABS (SUTURE) ×4 IMPLANT
SUT VICRYL 2 TP 1 (SUTURE) IMPLANT
SYR 20ML ECCENTRIC (SYRINGE) ×2 IMPLANT
SYSTEM RETRIEVAL ANCHOR 8 (MISCELLANEOUS) ×1 IMPLANT
SYSTEM SAHARA CHEST DRAIN ATS (WOUND CARE) ×2 IMPLANT
TAPE CLOTH 4X10 WHT NS (GAUZE/BANDAGES/DRESSINGS) ×2 IMPLANT
TIP APPLICATOR SPRAY EXTEND 16 (VASCULAR PRODUCTS) IMPLANT
TOWEL GREEN STERILE (TOWEL DISPOSABLE) ×2 IMPLANT
TRAY FOLEY MTR SLVR 16FR STAT (SET/KITS/TRAYS/PACK) ×2 IMPLANT
TROCAR BLADELESS 15MM (ENDOMECHANICALS) IMPLANT
WATER STERILE IRR 1000ML POUR (IV SOLUTION) ×2 IMPLANT

## 2021-08-05 NOTE — Hospital Course (Addendum)
History of Present Illness:  Perry [name] is sent for consultation regarding a right upper lobe lung nodule.   Perry [name] is a 75 year old man with a history of remote tobacco use, CAD, MI, CABG in 2004, ascending aortic aneurysm, hypertension, hyperlipidemia, sleep apnea, paroxysmal atrial fibrillation, and glucose intolerance.  He has been followed for a ground glass opacity in the right upper lobe for several years.  It was first noted in 2019 on a Ct done to follow his aneurysm.  He has been followed over time with minimal change from scan to scan but overall slow growth compared to a couple of years ago. He feels well. 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  Denies chest pain, pressure, tightness, shortness of breath. No change in appetite, weight loss. Smoked in past but quit 42 years ago.  It was felt he would benefit from surgical resection of the nodule.  The risks and benefits of the procedure were explained to the patient and he was agreeable to proceed.  Has been seen by cardiology and cleared for surgery.  Hospital Course:  Perry [name] presented to [facility] on 08/05/2021.  He was taken to the operating room and underwent Robotic Assisted Right Video Thoracoscopy with Posterior Segmentectomy of Right Lower Lobe, lymph node dissection, and intercostal nerve block.  He tolerated the procedure without difficulty, was extubated and taken to PACU in stable condition.    Postoperative hospital course:  Patient is remained quite stable.  Chest x-ray appearance on postop day 1 showed trace apical space and chest x-ray revealed no air leak.  Chest tube was removed on postop day 1.  Oxygen was weaned and he maintains good saturations on room air.  Incisions were noted to be healing well without evidence of infection.  He did have some ecchymosis.  This appeared to be stable and there was no evidence of hematoma.  Final path remains pending.  He did have some mild postoperative constipation and  was instructed to use over-the-counter laxative of choice.  He was voiding.  Creatinine remained within normal limits.  He had a very minor expected acute blood loss anemia.  Blood sugars were under adequate control.  At the time of discharge the patient was felt to be quite stable.

## 2021-08-05 NOTE — Interval H&P Note (Signed)
History and Physical Interval Note:  08/05/2021 7:22 AM  Perry [name].  has presented today for surgery, with the diagnosis of RUL NODULE.  The various methods of treatment have been discussed with the patient and family. After consideration of risks, benefits and other options for treatment, the patient has consented to  Procedure(s): XI ROBOTIC ASSISTED THORACOSCOPY- RIGHT UPPER LOBE POSTERIOR SEGMENTECTOMY, possible lobectomy (Right) as a surgical intervention.  The patient's history has been reviewed, patient examined, no change in status, stable for surgery.  I have reviewed the patient's chart and labs.  Questions were answered to the patient's satisfaction.     Melrose Nakayama

## 2021-08-05 NOTE — Anesthesia Postprocedure Evaluation (Signed)
Anesthesia Post Note  Patient: Perry [name].  Procedure(s) Performed: XI ROBOTIC ASSISTED THORACOSCOPY- RIGHT UPPER LOBE POSTERIOR SEGMENTECTOMY, possible lobectomy (Right: Chest) INTERCOSTAL NERVE BLOCK (Right: Chest) LYMPH NODE DISSECTION (Right: Chest)     Anesthesia Post Evaluation No notable events documented.  Last Vitals:  Vitals:   08/05/21 1240 08/05/21 1632  BP: 106/62 131/72  Pulse: 61 (!) 51  Resp: 12 17  Temp: 36.6 C 36.8 C  SpO2: 100% 95%    Last Pain:  Vitals:   08/05/21 1632  TempSrc: Oral  PainSc:                  Anaia Frith S

## 2021-08-05 NOTE — Transfer of Care (Signed)
Immediate Anesthesia Transfer of Care Note  Patient: Perry [name].  Procedure(s) Performed: XI ROBOTIC ASSISTED THORACOSCOPY- RIGHT UPPER LOBE POSTERIOR SEGMENTECTOMY, possible lobectomy (Right: Chest) INTERCOSTAL NERVE BLOCK (Right: Chest) LYMPH NODE DISSECTION (Right: Chest)  Patient Location: PACU  Anesthesia Type:General  Level of Consciousness: drowsy and patient cooperative  Airway & Oxygen Therapy: Patient Spontanous Breathing  Post-op Assessment: Report given to RN and Post -op Vital signs reviewed and stable  Post vital signs: Reviewed and stable  Last Vitals:  Vitals Value Taken Time  BP 137/81 08/05/21 1141  Temp    Pulse 72 08/05/21 1144  Resp 18 08/05/21 1144  SpO2 97 % 08/05/21 1144  Vitals shown include unvalidated device data.  Last Pain:  Vitals:   08/05/21 3295  TempSrc:   PainSc: 0-No pain      Patients Stated Pain Goal: 3 (18/84/16 6063)  Complications: No notable events documented.

## 2021-08-05 NOTE — Plan of Care (Signed)
  Problem: Education: Goal: Knowledge of General Education information will improve Description Including pain rating scale, medication(s)/side effects and non-pharmacologic comfort measures Outcome: Progressing   Problem: Health Behavior/Discharge Planning: Goal: Ability to manage health-related needs will improve Outcome: Progressing   Problem: Clinical Measurements: Goal: Respiratory complications will improve Outcome: Progressing   Problem: Activity: Goal: Risk for activity intolerance will decrease Outcome: Progressing   Problem: Nutrition: Goal: Adequate nutrition will be maintained Outcome: Progressing   Problem: Coping: Goal: Level of anxiety will decrease Outcome: Progressing   

## 2021-08-05 NOTE — Brief Op Note (Addendum)
08/05/2021  11:21 AM  PATIENT:  Perry [name].  75 y.o. male  PRE-OPERATIVE DIAGNOSIS:  RUL NODULE  POST-OPERATIVE DIAGNOSIS:  ADENOCARCINOMA RIGHT UPPER LOBE- CLINICAL STAGE IA (T1N0)  PROCEDURE:  Procedure(s):  XI ROBOTIC ASSISTED THORACOSCOPY -Posterior Segmentectomy Right Lower Lobe LYMPH NODE DISSECTION (Right) INTERCOSTAL NERVE BLOCK (Right)  SURGEON:  Surgeon(s) and Role:    Melrose Nakayama, MD - Primary  PHYSICIAN ASSISTANT: Ellwood Handler PA-C  ASSISTANTS: none   ANESTHESIA:   general  EBL:  400 mL   BLOOD ADMINISTERED:none  DRAINS:  [address]    LOCAL MEDICATIONS USED:  BUPIVICAINE   SPECIMEN:  Source of Specimen:  Posterior Superior Segment RLL, lymph nodes  DISPOSITION OF SPECIMEN:  PATHOLOGY  COUNTS:  YES  TOURNIQUET:  * No tourniquets in log *  DICTATION: .Dragon Dictation  PLAN OF CARE: Admit to inpatient   PATIENT DISPOSITION:  PACU - hemodynamically stable.   Delay start of Pharmacological VTE agent (>24hrs) due to surgical blood loss or risk of bleeding: no

## 2021-08-05 NOTE — Progress Notes (Signed)
°  Transition of Care [facility]) Screening Note   Patient Details  Name: Perry [name]. Date of Birth: 06/21/1946   Transition of Care [facility]) CM/SW Contact:    Pollie Friar, RN Phone Number: 08/05/2021, 1:11 PM    Transition of Care Department [facility]) has reviewed patient. We will continue to monitor patient advancement through interdisciplinary progression rounds. If new patient transition needs arise, please place a TOC consult.

## 2021-08-05 NOTE — Anesthesia Procedure Notes (Signed)
Procedure Name: Intubation Date/Time: 08/05/2021 8:51 AM Performed by: Lance Coon, CRNA Pre-anesthesia Checklist: Patient identified, Emergency Drugs available, Suction available, Patient being monitored and Timeout performed Patient Re-evaluated:Patient Re-evaluated prior to induction Oxygen Delivery Method: Circle system utilized Preoxygenation: Pre-oxygenation with 100% oxygen Induction Type: IV induction Ventilation: Mask ventilation without difficulty Laryngoscope Size: Miller and 3 Grade View: Grade I Endobronchial tube: Left, Double lumen EBT, EBT position confirmed by auscultation and EBT position confirmed by fiberoptic bronchoscope and 39 Fr Number of attempts: 1 Airway Equipment and Method: Fiberoptic brochoscope Placement Confirmation: ETT inserted through vocal cords under direct vision, positive ETCO2 and breath sounds checked- equal and bilateral Tube secured with: Tape Dental Injury: Teeth and Oropharynx as per pre-operative assessment

## 2021-08-05 NOTE — Discharge Summary (Addendum)
Physician Discharge Summary  Patient ID: Perry [name]. MRN: [id number] DOB/AGE: [birth year] 75 y.o.  Admit date: 08/05/2021 Discharge date: 08/07/2020  Admission Diagnoses:  Patient Active Problem List   Diagnosis Date Noted   Lung nodule 08/05/2021   Pulmonary nodule 06/09/2021   History of epistaxis 12/01/2019   Intolerance of continuous positive airway pressure (CPAP) ventilation 12/01/2019   Hypersomnia with sleep apnea 12/01/2019   Other insomnia 12/01/2019   Thoracic aortic aneurysm 05/29/2018   Bradycardia 04/16/2017   Acute appendicitis with perforation and peritoneal abscess 05/04/2014   Abscess of abdominal cavity ([place]) 05/04/2014   Atrial fibrillation ([place]) 04/28/2014   Hyperlipidemia    Sleep apnea    CAD (coronary artery disease)    Hearing loss    Diabetes mellitus ([place])    Discharge Diagnoses: Adenocarcinoma right upper lobe, Clinical and pathologic stage IAc (T1c,No)  Patient Active Problem List   Diagnosis Date Noted   Lung nodule 08/05/2021   S/P Robotic Assisted Right VATS with Posterior Segmentectomy of RLL 08/05/2021   Pulmonary nodule 06/09/2021   History of epistaxis 12/01/2019   Intolerance of continuous positive airway pressure (CPAP) ventilation 12/01/2019   Hypersomnia with sleep apnea 12/01/2019   Other insomnia 12/01/2019   Thoracic aortic aneurysm 05/29/2018   Bradycardia 04/16/2017   Acute appendicitis with perforation and peritoneal abscess 05/04/2014   Abscess of abdominal cavity ([place]) 05/04/2014   Atrial fibrillation ([place]) 04/28/2014   Hyperlipidemia    Sleep apnea    CAD (coronary artery disease)    Hearing loss    Diabetes mellitus ([place])    Discharged Condition: good  History of Present Illness:  Perry [name] is sent for consultation regarding a right upper lobe lung nodule.   Perry [name] is a 75 year old man with a history of remote tobacco use, CAD, MI, CABG in 2004, ascending aortic aneurysm, hypertension,  hyperlipidemia, sleep apnea, paroxysmal atrial fibrillation, and glucose intolerance.  He has been followed for a ground glass opacity in the right upper lobe for several years.  It was first noted in 2019 on a Ct done to follow his aneurysm.  He has been followed over time with minimal change from scan to scan but overall slow growth compared to a couple of years ago. He feels well. 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  Denies chest pain, pressure, tightness, shortness of breath. No change in appetite, weight loss. Smoked in past but quit 42 years ago.  It was felt he would benefit from surgical resection of the nodule.  The risks and benefits of the procedure were explained to the patient and he was agreeable to proceed.  Has been seen by cardiology and cleared for surgery.  Hospital Course:  Perry [name] presented to [facility] on 08/05/2021.  He was taken to the operating room and underwent Robotic Assisted Right Video Thoracoscopy with Posterior Segmentectomy of Right Lower Lobe, lymph node dissection, and intercostal nerve block.  He tolerated the procedure without difficulty, was extubated and taken to PACU in stable condition.    Postoperative hospital course:  Patient is remained quite stable.  Chest x-ray appearance on postop day 1 showed trace apical space and chest x-ray revealed no air leak.  Chest tube was removed on postop day 1.  Oxygen was weaned and he maintains good saturations on room air.  Incisions were noted to be healing well without evidence of infection.  He did have some ecchymosis.  This appeared to be stable and there  was no evidence of hematoma.  Final path remains pending.  He did have some mild postoperative constipation and was instructed to use over-the-counter laxative of choice.  He was voiding.  Creatinine remained within normal limits.  He had a very minor expected acute blood loss anemia.  Blood sugars were under adequate control.  At the time of discharge the  patient was felt to be quite stable.  Significant Diagnostic Studies: nuclear medicine:   CHEST: Ground-glass nodule of the right upper lobe is unchanged in size compared to prior exam and demonstrates mild FDG uptake which is below mediastinal blood pool, SUV max of 1.6. no hypermetabolic lymph nodes seen in the chest  IMPRESSION: 1. Ground-glass nodule of the right upper lobe demonstrates mild FDG uptake below mediastinal blood pool. Indolent primary lung neoplasm remains a concern and follow-up chest CT in 6 months is recommended. 2. No hypermetabolic lymph nodes seen in the chest. 3. No evidence of metastatic disease in the abdomen or pelvis.    Electronically Signed   By: Yetta Glassman M.D.   On: 06/24/2021 09:16   Treatments: surgery:  Operative Report    DATE OF PROCEDURE: 08/05/2021   PREOPERATIVE DIAGNOSIS:  Right upper lobe lung nodule.   POSTOPERATIVE DIAGNOSIS:  Adenocarcinoma, right upper lobe, clinical stage IA (T1, N0).   PROCEDURE:  Robotic-assisted right upper lobe posterior segmentectomy, lymph node dissection and intercostal nerve blocks levels 3 through 10.   SURGEON:  Revonda Standard. Roxan Hockey, MD   ASSISTANT:  Ellwood Handler.   ANESTHESIA:  General.   FINDINGS:  Frozen section revealed adenocarcinoma stapled and bronchial margins negative for tumor. PATHOLOGY:  Discharge Exam: Blood pressure 105/68, pulse 70, temperature 98 F (36.7 C), temperature source Oral, resp. rate 18, height 6' (1.829 m), weight 106.6 kg, SpO2 95 %.  General appearance: alert, cooperative, and no distress Heart: regular rate and rhythm and extrasystoles Lungs: dim right base Abdomen: + BS, soft, non tender, non distended Extremities: trace edema Wound: incis with echymosis, no signs of infection   Disposition: Discharge disposition: 01-Home or Self Care       Discharge Instructions     Discharge patient   Complete by: As directed    Discharge disposition:  01-Home or Self Care   Discharge patient date: 08/07/2021      Allergies as of 08/07/2021   No Known Allergies      Medication List     STOP taking these medications    Pfizer COVID-19 Vac Bivalent injection Generic drug: COVID-19 mRNA bivalent vaccine Therapist, music)       TAKE these medications    aspirin 81 MG chewable tablet Commonly known as: HM Aspirin Chew 1 tablet (81 mg total) by mouth daily. What changed: additional instructions   Belsomra 15 MG Tabs Generic drug: Suvorexant TAKE 1 TABLET BY MOUTH AT BEDTIME AS NEEDED   fenofibrate 145 MG tablet Commonly known as: TRICOR TAKE 1 TABLET BY MOUTH DAILY - NEEDS LABWORK BEFORE ANY MORE REFILLS   fish oil-omega-3 fatty acids 1000 MG capsule Take 2 g by mouth daily.   Invokana 100 MG Tabs tablet Generic drug: canagliflozin TAKE 1 TABLET BY MOUTH EVERY DAY BEFORE BREAKFAST   lisinopril 40 MG tablet Commonly known as: ZESTRIL Take 1 tablet (40 mg total) by mouth daily.   metFORMIN 500 MG 24 hr tablet Commonly known as: GLUCOPHAGE-XR TAKE 2 TABLETS BY MOUTH EVERY MORNING - NEEDS LABWORK BEFORE ANY MORE REFILLS   metoprolol succinate 50 MG 24  hr tablet Commonly known as: TOPROL-XL Take 1 tablet (50 mg total) by mouth daily. Take with or immediately following a meal   MULTIVITAMIN ADULT PO Take 1 tablet by mouth daily.   nitroGLYCERIN 0.4 MG SL tablet Commonly known as: NITROSTAT Place 1 tablet (0.4 mg total) under the tongue every 5 (five) minutes as needed for chest pain.   oxyCODONE 5 MG immediate release tablet Commonly known as: Oxy IR/ROXICODONE Take 1 tablet (5 mg total) by mouth every 6 (six) hours as needed for up to 7 days for moderate pain.   rosuvastatin 10 MG tablet Commonly known as: CRESTOR TAKE 1 TABLET BY MOUTH DAILY        Follow-up Information     Melrose Nakayama, MD Follow up on 08/16/2021.   Specialty: Cardiothoracic Surgery Why: Appointment is at 9:15, please get CXR at 8:45  at [facility] located on first floor of our office building Contact information: [place] [address] [phone]                 Signed: John Giovanni, PA-C  08/12/2021, 11:19 AM

## 2021-08-05 NOTE — Anesthesia Postprocedure Evaluation (Signed)
Anesthesia Post Note  Patient: Khyren Hing.  Procedure(s) Performed: XI ROBOTIC ASSISTED THORACOSCOPY- RIGHT UPPER LOBE POSTERIOR SEGMENTECTOMY, possible lobectomy (Right: Chest) INTERCOSTAL NERVE BLOCK (Right: Chest) LYMPH NODE DISSECTION (Right: Chest)     Patient location during evaluation: PACU Anesthesia Type: General Level of consciousness: awake and alert Pain management: pain level controlled Vital Signs Assessment: post-procedure vital signs reviewed and stable Respiratory status: spontaneous breathing, nonlabored ventilation, respiratory function stable and patient connected to nasal cannula oxygen Cardiovascular status: blood pressure returned to baseline and stable Postop Assessment: no apparent nausea or vomiting Anesthetic complications: no   No notable events documented.  Last Vitals:  Vitals:   08/05/21 1240 08/05/21 1632  BP: 106/62 131/72  Pulse: 61 (!) 51  Resp: 12 17  Temp: 36.6 C 36.8 C  SpO2: 100% 95%    Last Pain:  Vitals:   08/05/21 1632  TempSrc: Oral  PainSc:                  Lynnley Doddridge S

## 2021-08-05 NOTE — Op Note (Signed)
NAME: BANKS, CHAIKIN MEDICAL RECORD NO: [id number] ACCOUNT NO: [id number] DATE OF BIRTH: Nov 30, 1945 FACILITY: [facility] LOCATION: [facility] PHYSICIAN: Revonda Standard. Roxan Hockey, MD  Operative Report   DATE OF PROCEDURE: 08/05/2021  PREOPERATIVE DIAGNOSIS:  Right upper lobe lung nodule.  POSTOPERATIVE DIAGNOSIS:  Adenocarcinoma, right upper lobe, clinical stage IA (T1, N0).  PROCEDURE:  Robotic-assisted right upper lobe posterior segmentectomy, lymph node dissection and intercostal nerve blocks levels 3 through 10.  SURGEON:  Revonda Standard. Roxan Hockey, MD  ASSISTANT:  Ellwood Handler, PA.  ANESTHESIA:  General.  FINDINGS:  Frozen section revealed adenocarcinoma. Stapled and bronchial margins negative for tumor.  CLINICAL NOTE PROCEDURE:  Mr. Gloss is a 75 year old gentleman with a remote history of tobacco abuse, who has been followed for a ground glass nodule for several years.  Over time, there has been slow progression of the nodule.  He was advised to  undergo surgical resection for definitive diagnosis and treatment.  The indications, risks, benefits, and alternatives were discussed in detail with the patient.  He understood and accepted the risks and agreed to proceed.  DESCRIPTION OF PROCEDURE:  Mr. Ihde was brought to the preoperative holding area on 08/05/2021.  Anesthesia placed a central venous catheter and an arterial blood pressure monitoring line.  He was taken to the operating room and anesthetized and  intubated.  A Foley catheter was placed.  Intravenous antibiotics were administered.  Sequential compression devices were placed on the calves for DVT prophylaxis.  He was placed in the left lateral decubitus position.  A Bair Hugger was placed for  active warming.  The right chest was prepped and draped in the usual sterile fashion.  Single lung ventilation of the left lung was initiated and was tolerated well throughout the procedure.  A timeout was performed.  A solution containing  20 mL of liposomal bupivacaine, 30 mL of 0.5% bupivacaine and 50 mL of saline was prepared.  This was used for local at the incision sites as well as for the intercostal nerve blocks. An incision was made  in the eighth interspace in the mid axillary line.  An 8 mm port was inserted.  The thoracoscope was advanced into the chest.  After confirming intrapleural placement, carbon dioxide was insufflated per protocol.  Additional 12 mm robotic ports were placed  anterior and posterior to the camera port and a 12 mm AirSeal port was placed in the tenth interspace centered between the 2 more anterior ports.  Intercostal nerve blocks then were performed by injecting 10 mL of the bupivacaine solution into a  subpleural plane at each level from the 3rd to the 10th interspace.  An 8 mm robotic port was placed in the eighth interspace posteriorly for retraction. The robot was deployed.  The camera arm was docked.  The remaining arms were docked.  The robotic  instruments were inserted with thoracoscopic visualization.  The lung was retracted superiorly.  The inferior ligament was divided with bipolar cautery.  No level 9 node was identified, but there was a relatively large level 8 node that was removed.  Working further superiorly, after dividing the pleural reflection at  the hilum level 7 nodes were encountered.  There were multiple nodes in the level 7 area and these were very vascular.  There was some bleeding that was controlled with cautery and pressure using Surgicel.  The level 11 nodes then were dissected out at  the bifurcation of the upper lobe bronchus from the bronchus intermedius.  Working superiorly, there  were some adhesions at the hilum that were taken down.  Level 10 node was identified and was removed.  The pleura was opened superior to the  azygos vein and level 4 nodes were removed.  There was some minor bleeding with that and Surgicel was placed in that area as well.  Attention then was turned  to the fissure.  Blunt dissection was used to separate the upper and lower lobes near the  confluence of the fissures and the pulmonary artery was visible.  The pleura overlying the PA was incised with the bipolar cautery.  The superior segmental branch was identified and after taking the pleural reflection off of that, the remainder of the  fissure was completed with sequential firings of the robotic stapler using blue cartridges.  A small piece of the superior segment was taken with the specimen.  The posterior ascending branches were apparent.  There were two branches that arose as a  common trunk and bifurcated almost immediately supplying the posterior aspect of the upper lobe. The posterior segmental vein was identified.  It was encircled and divided with a stapler using a vascular cartridge. Level 12 node was removed and then a  level 13 lymph node was removed.  The posterior ascending arterial branches, then were encircled and divided with a vascular stapler as well.  The posterior segmental bronchus then was dissected out.  A stapler with a blue cartridge was placed across the  posterior segmental bronchus and closed.  Test inflation showed good aeration of the lower and middle lobes as well as the remainder of the upper lobe.  The stapler was fired, transecting the bronchus.  10 mL of ICG was administered intravenously and  the Firefly setting on the robot was used.  There was good correlation with the posterior segment based on both aeration and vascularity.  The segmentectomy was completed with sequential firings of the robotic stapler using green and black cartridges.   The specimen then was inserted into an 8 mm endoscopic retrieval bag, and brought down to the inferior aspect of the chest.  The robotic instruments were removed and the robot was undocked.  The anterior eighth interspace incision was lengthened to 2 cm and  the specimen was removed.  The stapled margin, bronchial margin and  nodule were all marked and sent for frozen section and subsequently returned showing adenocarcinoma and the margins were free of tumor.  The chest was copiously irrigated with warm saline.  A test inflation to 30 cm of water revealed no air leak.  Inspection of the chest included removal of all the sponges as well as the vessel loop, which was removed before undocking the robot.  After receiving confirmation of the frozen section, a 28-French Blake drain was placed through the original port incision and directed to the apex and was secured with a #1 silk suture.  Dual lung ventilation was resumed.  The incisions were closed with  0 Vicryl fascial sutures followed by a 3-0 Vicryl subcuticular sutures.  Dermabond was applied.  The chest tube was placed to Pleur-Evac on waterseal.  The patient was placed back in the supine position.  He was extubated in the operating room and taken to  the postanesthetic care unit in good condition.  All sponge, needle and instrument counts were correct at the end of the procedure.  Experienced assistance was necessary for this case due to complexity.  Erin Barrett provided exposure, instrument exchange, suctioning, and suturing during the course of the  procedure.   PUS D: 08/05/2021 1:05:28 pm T: 08/05/2021 6:42:00 pm  JOB: [id number]/ [id number]

## 2021-08-06 ENCOUNTER — Encounter (HOSPITAL_COMMUNITY): Payer: Self-pay | Admitting: Thoracic Surgery (Cardiothoracic Vascular Surgery)

## 2021-08-06 ENCOUNTER — Inpatient Hospital Stay (HOSPITAL_COMMUNITY): Payer: Medicare Other

## 2021-08-06 LAB — CBC
HCT: 35.9 % — ABNORMAL LOW (ref 39.0–52.0)
Hemoglobin: 12.1 g/dL — ABNORMAL LOW (ref 13.0–17.0)
MCH: 31.5 pg (ref 26.0–34.0)
MCHC: 33.7 g/dL (ref 30.0–36.0)
MCV: 93.5 fL (ref 80.0–100.0)
Platelets: 170 10*3/uL (ref 150–400)
RBC: 3.84 MIL/uL — ABNORMAL LOW (ref 4.22–5.81)
RDW: 12.7 % (ref 11.5–15.5)
WBC: 9.6 10*3/uL (ref 4.0–10.5)
nRBC: 0 % (ref 0.0–0.2)

## 2021-08-06 LAB — GLUCOSE, CAPILLARY
Glucose-Capillary: 129 mg/dL — ABNORMAL HIGH (ref 70–99)
Glucose-Capillary: 130 mg/dL — ABNORMAL HIGH (ref 70–99)
Glucose-Capillary: 133 mg/dL — ABNORMAL HIGH (ref 70–99)
Glucose-Capillary: 137 mg/dL — ABNORMAL HIGH (ref 70–99)
Glucose-Capillary: 178 mg/dL — ABNORMAL HIGH (ref 70–99)
Glucose-Capillary: 194 mg/dL — ABNORMAL HIGH (ref 70–99)

## 2021-08-06 LAB — BASIC METABOLIC PANEL
Anion gap: 9 (ref 5–15)
BUN: 37 mg/dL — ABNORMAL HIGH (ref 8–23)
CO2: 20 mmol/L — ABNORMAL LOW (ref 22–32)
Calcium: 8.9 mg/dL (ref 8.9–10.3)
Chloride: 105 mmol/L (ref 98–111)
Creatinine, Ser: 1.17 mg/dL (ref 0.61–1.24)
GFR, Estimated: >60 mL/min (ref >60)
Glucose, Bld: 145 mg/dL — ABNORMAL HIGH (ref 70–99)
Potassium: 4.1 mmol/L (ref 3.5–5.1)
Sodium: 134 mmol/L — ABNORMAL LOW (ref 135–145)

## 2021-08-06 MED ORDER — INSULIN ASPART 100 UNIT/ML IJ SOLN
0.0000 [IU] | Freq: Three times a day (TID) | INTRAMUSCULAR | Status: DC
  Administered 2021-08-06 (×2): 4 [IU] via SUBCUTANEOUS
  Administered 2021-08-07: 2 [IU] via SUBCUTANEOUS

## 2021-08-06 MED ORDER — INSULIN ASPART 100 UNIT/ML IJ SOLN: 0.0000 [IU] | INTRAMUSCULAR | Status: DC

## 2021-08-06 NOTE — Progress Notes (Addendum)
[address]       [place] [address]             [phone]      1 Day Post-Op Procedure(s) (LRB): XI ROBOTIC ASSISTED THORACOSCOPY- RIGHT UPPER LOBE POSTERIOR SEGMENTECTOMY, possible lobectomy (Right) INTERCOSTAL NERVE BLOCK (Right) LYMPH NODE DISSECTION (Right) Subjective: Minor pain  Objective: Vital signs in last 24 hours: Temp:  [97.6 F (36.4 C)-98.2 F (36.8 C)] 97.7 F (36.5 C) (12/31 0735) Pulse Rate:  [50-72] 50 (12/31 0735) Cardiac Rhythm: Sinus bradycardia;Heart block (12/31 0700) Resp:  [12-18] 17 (12/31 0735) BP: (105-137)/(62-81) 105/73 (12/31 0735) SpO2:  [94 %-100 %] 97 % (12/31 0735) Arterial Line BP: (131-145)/(60-64) 131/64 (12/30 1210)  Hemodynamic parameters for last 24 hours:    Intake/Output from previous day: 12/30 0701 - 12/31 0700 In: 3126.4 [P.O.:360; I.V.:2766.4] Out: 1422 [Urine:900; Blood:400; Chest Tube:122] Intake/Output this shift: Total I/O In: 440 [P.O.:240; IV Piggyback:200] Out: 100 [Chest Tube:100]  General appearance: alert, cooperative, and no distress Heart: regular rate and rhythm Lungs: coarse BS on right Abdomen: benign Extremities: ne edema or calf tenderness Wound: healing well  Lab Results: Recent Labs    08/03/21 1340 08/06/21 0248  WBC 5.5 9.6  HGB 14.8 12.1*  HCT 43.2 35.9*  PLT 181 170   BMET:  Recent Labs    08/03/21 1340 08/06/21 0248  NA 136 134*  K 3.8 4.1  CL 107 105  CO2 18* 20*  GLUCOSE 238* 145*  BUN 26* 37*  CREATININE 1.15 1.17  CALCIUM 9.6 8.9    PT/INR:  Recent Labs    08/03/21 1340  LABPROT 14.8  INR 1.2   ABG    Component Value Date/Time   PHART 7.416 08/03/2021 1404   HCO3 21.8 08/03/2021 1404   TCO2 21 03/21/2009 2350   ACIDBASEDEF 2.1 (H) 08/03/2021 1404   O2SAT 98.0 08/03/2021 1404   CBG (last 3)  Recent Labs    08/06/21 0007 08/06/21 0402 08/06/21 0738  GLUCAP 129* 133* 137*    Meds Scheduled Meds:  acetaminophen  1,000 mg  Oral Q6H   Or   acetaminophen (TYLENOL) oral liquid 160 mg/5 mL  1,000 mg Oral Q6H   aspirin  81 mg Oral Daily   bisacodyl  10 mg Oral Daily   enoxaparin (LOVENOX) injection  40 mg Subcutaneous QHS   fenofibrate  54 mg Oral Daily   insulin aspart  0-24 Units Subcutaneous Q4H   lisinopril  10 mg Oral Daily   metoprolol succinate  50 mg Oral Daily   rosuvastatin  10 mg Oral Daily   senna-docusate  1 tablet Oral QHS   Continuous Infusions: PRN Meds:.morphine injection, ondansetron (ZOFRAN) IV, oxyCODONE  Xrays DG Chest Port 1 View  Result Date: 08/05/2021 CLINICAL DATA:  Status post right lung surgery. EXAM: PORTABLE CHEST 1 VIEW COMPARISON:  08/03/2021. FINDINGS: Since the previous exam, right lung surgery has been performed. Pulmonary anastomosis staples extend superiorly from the right hilum. A right-sided chest tube has its tip projecting over the right upper lobe above the right hilum. Linear opacity is noted in the left lung base consistent with atelectasis. Remainder of the lungs is clear. Stable changes from prior CABG surgery. Cardiac silhouette normal in size. No mediastinal widening or masses. No pneumothorax. IMPRESSION: 1. Postop from right lung surgery. No evidence of an operative complication. No acute findings. 2. Left lung base atelectasis. Electronically Signed   By: Dedra Skeens.D.  On: 08/05/2021 13:45    Assessment/Plan: S/P Procedure(s) (LRB): XI ROBOTIC ASSISTED THORACOSCOPY- RIGHT UPPER LOBE POSTERIOR SEGMENTECTOMY, possible lobectomy (Right) INTERCOSTAL NERVE BLOCK (Right) LYMPH NODE DISSECTION (Right)  POD#1 1 afeb, VSS, SR/SB 2 sats good on RA 3 CT only 120 cc yesterday but dumped 100 this am getting into chair, no air leak- d/c  4 CXR poss very small right apical pntx 5 fair UOP, normal creat, BUN typically runs a little high 6 no leukocytosis 7 minor expected ABLA 8 CBG pretty well controlled- cont SSI, resume home meds closer to d/c when appetite  improves 9 final path pending 10 routine rehab 11 prob home in am if no new issues  LOS: 1 day    John Giovanni PA-C Pager [phone] 08/06/2021   Patient seen and examined and discussed treatment plan with Mr. Girtha Rm on rounds this AM  Earlier todayhad more pain earlier due to positioning for CT removal, but feels better this evening.  Revonda Standard Roxan Hockey, MD [facility] [phone]

## 2021-08-06 NOTE — Progress Notes (Signed)
Mobility Specialist: Progress Note   08/06/21 1531  Mobility  Activity Ambulated in hall  Level of Assistance Independent  Assistive Device None  Distance Ambulated (ft) 340 ft  Mobility Ambulated independently in hallway  Mobility Response Tolerated well  Mobility performed by Mobility specialist  $Mobility charge 1 Mobility   Pre-Mobility: 56 HR, 97% SpO2 During Mobility: 80 HR Post-Mobility: 62 HR, 95% SpO2  Pt c/o some chest pain from chest tube being pulled previously, otherwise no other c/o. Pt back to bed after walk per request with call bell and phone at his side. Pt requesting pain medication, RN notified.   [name] Mobility Specialist Mobility Specialist 4 [place]: [phone] [name]: [phone]

## 2021-08-07 ENCOUNTER — Inpatient Hospital Stay (HOSPITAL_COMMUNITY): Payer: Medicare Other

## 2021-08-07 LAB — COMPREHENSIVE METABOLIC PANEL
ALT: 37 U/L (ref 0–44)
AST: 33 U/L (ref 15–41)
Albumin: 3 g/dL — ABNORMAL LOW (ref 3.5–5.0)
Alkaline Phosphatase: 39 U/L (ref 38–126)
Anion gap: 5 (ref 5–15)
BUN: 35 mg/dL — ABNORMAL HIGH (ref 8–23)
CO2: 25 mmol/L (ref 22–32)
Calcium: 9 mg/dL (ref 8.9–10.3)
Chloride: 105 mmol/L (ref 98–111)
Creatinine, Ser: 1.23 mg/dL (ref 0.61–1.24)
GFR, Estimated: >60 mL/min (ref >60)
Glucose, Bld: 129 mg/dL — ABNORMAL HIGH (ref 70–99)
Potassium: 3.8 mmol/L (ref 3.5–5.1)
Sodium: 135 mmol/L (ref 135–145)
Total Bilirubin: 0.4 mg/dL (ref 0.3–1.2)
Total Protein: 5.6 g/dL — ABNORMAL LOW (ref 6.5–8.1)

## 2021-08-07 LAB — CBC
HCT: 37 % — ABNORMAL LOW (ref 39.0–52.0)
Hemoglobin: 12.2 g/dL — ABNORMAL LOW (ref 13.0–17.0)
MCH: 31.5 pg (ref 26.0–34.0)
MCHC: 33 g/dL (ref 30.0–36.0)
MCV: 95.6 fL (ref 80.0–100.0)
Platelets: 163 10*3/uL (ref 150–400)
RBC: 3.87 MIL/uL — ABNORMAL LOW (ref 4.22–5.81)
RDW: 13 % (ref 11.5–15.5)
WBC: 8.2 10*3/uL (ref 4.0–10.5)
nRBC: 0 % (ref 0.0–0.2)

## 2021-08-07 LAB — GLUCOSE, CAPILLARY: Glucose-Capillary: 127 mg/dL — ABNORMAL HIGH (ref 70–99)

## 2021-08-07 MED ORDER — POLYETHYLENE GLYCOL 3350 17 G PO PACK
17.0000 g | PACK | Freq: Every day | ORAL | Status: DC
  Administered 2021-08-07: 17 g via ORAL
  Filled 2021-08-07: qty 1

## 2021-08-07 MED ORDER — OXYCODONE HCL 5 MG PO TABS: 5.0000 mg | ORAL_TABLET | Freq: Four times a day (QID) | ORAL | 0 refills | Status: AC | PRN

## 2021-08-07 NOTE — Progress Notes (Signed)
Pt got discharged to home, discharge instructions provided and patient showed understanding to it, IV taken out,Telemonitor DC,pt left unit in wheelchair with all of the belongings accompanied with a family member (wife) 

## 2021-08-07 NOTE — Progress Notes (Signed)
Pt was c/o constipation and need for something for bowel movement.Marland Kitchen gave him warm prune/apple juice to see if this would help his bowels move

## 2021-08-07 NOTE — Plan of Care (Signed)
Problem: Education: Goal: Knowledge of General Education information will improve Description: Including pain rating scale, medication(s)/side effects and non-pharmacologic comfort measures Outcome: Completed/Met   Problem: Health Behavior/Discharge Planning: Goal: Ability to manage health-related needs will improve Outcome: Completed/Met   Problem: Clinical Measurements: Goal: Ability to maintain clinical measurements within normal limits will improve Outcome: Completed/Met Goal: Will remain free from infection Outcome: Completed/Met Goal: Diagnostic test results will improve Outcome: Completed/Met Goal: Respiratory complications will improve Outcome: Completed/Met Goal: Cardiovascular complication will be avoided Outcome: Completed/Met   Problem: Activity: Goal: Risk for activity intolerance will decrease Outcome: Completed/Met   Problem: Nutrition: Goal: Adequate nutrition will be maintained Outcome: Completed/Met   Problem: Coping: Goal: Level of anxiety will decrease Outcome: Completed/Met   Problem: Elimination: Goal: Will not experience complications related to bowel motility Outcome: Completed/Met Goal: Will not experience complications related to urinary retention Outcome: Completed/Met   Problem: Pain Managment: Goal: General experience of comfort will improve Outcome: Completed/Met   Problem: Safety: Goal: Ability to remain free from injury will improve Outcome: Completed/Met   Problem: Skin Integrity: Goal: Risk for impaired skin integrity will decrease Outcome: Completed/Met   Problem: Education: Goal: Knowledge of disease or condition will improve Outcome: Completed/Met Goal: Knowledge of the prescribed therapeutic regimen will improve Outcome: Completed/Met   Problem: Activity: Goal: Risk for activity intolerance will decrease Outcome: Completed/Met   Problem: Cardiac: Goal: Will achieve and/or maintain hemodynamic stability Outcome:  Completed/Met   Problem: Clinical Measurements: Goal: Postoperative complications will be avoided or minimized Outcome: Completed/Met   Problem: Respiratory: Goal: Respiratory status will improve Outcome: Completed/Met   Problem: Pain Management: Goal: Pain level will decrease Outcome: Completed/Met   Problem: Skin Integrity: Goal: Wound healing without signs and symptoms infection will improve Outcome: Completed/Met   

## 2021-08-07 NOTE — Progress Notes (Addendum)
[address]       [place] [address]             [phone]      2 Days Post-Op Procedure(s) (LRB): XI ROBOTIC ASSISTED THORACOSCOPY- RIGHT UPPER LOBE POSTERIOR SEGMENTECTOMY, possible lobectomy (Right) INTERCOSTAL NERVE BLOCK (Right) LYMPH NODE DISSECTION (Right) Subjective: Feels well, mild constipation sx  Objective: Vital signs in last 24 hours: Temp:  [97.7 F (36.5 C)-98.9 F (37.2 C)] 98 F (36.7 C) (01/01 0726) Pulse Rate:  [44-70] 70 (01/01 0726) Cardiac Rhythm: Heart block (01/01 0739) Resp:  [14-20] 18 (01/01 0726) BP: (104-133)/(63-72) 105/68 (01/01 0726) SpO2:  [90 %-95 %] 95 % (01/01 0726)  Hemodynamic parameters for last 24 hours:    Intake/Output from previous day: 12/31 0701 - 01/01 0700 In: 800 [P.O.:600; IV Piggyback:200] Out: 100 [Chest Tube:100] Intake/Output this shift: Total I/O In: 240 [P.O.:240] Out: 300 [Urine:300]  General appearance: alert, cooperative, and no distress Heart: regular rate and rhythm and extrasystoles Lungs: dim right base Abdomen: + BS, soft, non tender, non distended Extremities: trace edema Wound: incis with echymosis, no signs of infection  Lab Results: Recent Labs    08/06/21 0248 08/07/21 0123  WBC 9.6 8.2  HGB 12.1* 12.2*  HCT 35.9* 37.0*  PLT 170 163   BMET:  Recent Labs    08/06/21 0248 08/07/21 0123  NA 134* 135  K 4.1 3.8  CL 105 105  CO2 20* 25  GLUCOSE 145* 129*  BUN 37* 35*  CREATININE 1.17 1.23  CALCIUM 8.9 9.0    PT/INR: No results for input(s): LABPROT, INR in the last 72 hours. ABG    Component Value Date/Time   PHART 7.416 08/03/2021 1404   HCO3 21.8 08/03/2021 1404   TCO2 21 03/21/2009 2350   ACIDBASEDEF 2.1 (H) 08/03/2021 1404   O2SAT 98.0 08/03/2021 1404   CBG (last 3)  Recent Labs    08/06/21 1607 08/06/21 2120 08/07/21 0618  GLUCAP 178* 194* 127*    Meds Scheduled Meds:  acetaminophen  1,000 mg Oral Q6H   Or   acetaminophen (TYLENOL)  oral liquid 160 mg/5 mL  1,000 mg Oral Q6H   aspirin  81 mg Oral Daily   bisacodyl  10 mg Oral Daily   enoxaparin (LOVENOX) injection  40 mg Subcutaneous QHS   fenofibrate  54 mg Oral Daily   insulin aspart  0-24 Units Subcutaneous TID WC & HS   lisinopril  10 mg Oral Daily   metoprolol succinate  50 mg Oral Daily   rosuvastatin  10 mg Oral Daily   senna-docusate  1 tablet Oral QHS   Continuous Infusions: PRN Meds:.morphine injection, ondansetron (ZOFRAN) IV, oxyCODONE  Xrays DG Chest Port 1 View  Result Date: 08/06/2021 CLINICAL DATA:  76 year old male status post partial lobectomy of the lung. EXAM: PORTABLE CHEST 1 VIEW COMPARISON:  Chest x-ray 08/05/2021. FINDINGS: Right-sided chest tube with tip in the upper right hemithorax. Small residual right apical pneumothorax occupying less than 5% of the volume of the right hemithorax. Soft tissue emphysema in the deep soft tissues of the right chest wall. Lung volumes are low. No consolidative airspace disease. No pleural effusions. No evidence of pulmonary edema. Heart size is normal. Upper mediastinal contours are within normal limits. Status post median sternotomy for CABG. IMPRESSION: 1. Postoperative changes and support apparatus, as above. 2. Small right apical pneumothorax occupying less than 5% of the volume of the right hemithorax. Electronically  Signed   By: Vinnie Langton M.D.   On: 08/06/2021 09:43   DG Chest Port 1 View  Result Date: 08/05/2021 CLINICAL DATA:  Status post right lung surgery. EXAM: PORTABLE CHEST 1 VIEW COMPARISON:  08/03/2021. FINDINGS: Since the previous exam, right lung surgery has been performed. Pulmonary anastomosis staples extend superiorly from the right hilum. A right-sided chest tube has its tip projecting over the right upper lobe above the right hilum. Linear opacity is noted in the left lung base consistent with atelectasis. Remainder of the lungs is clear. Stable changes from prior CABG surgery.  Cardiac silhouette normal in size. No mediastinal widening or masses. No pneumothorax. IMPRESSION: 1. Postop from right lung surgery. No evidence of an operative complication. No acute findings. 2. Left lung base atelectasis. Electronically Signed   By: Lajean Manes M.D.   On: 08/05/2021 13:45    Assessment/Plan: S/P Procedure(s) (LRB): XI ROBOTIC ASSISTED THORACOSCOPY- RIGHT UPPER LOBE POSTERIOR SEGMENTECTOMY, possible lobectomy (Right) INTERCOSTAL NERVE BLOCK (Right) LYMPH NODE DISSECTION (Right)   1 afeb, VSS 2 sats good on RA 3 voiding well 4 labs very stable 5 CXR minor apical space, post surgical changes 6 final path pending 7 may use OTC laxative of choice 8 stable for d/c      LOS: 2 days    John Giovanni PA-C Pager [phone] 08/07/2021   Patient seen and examined, agree with above Path still pending  Remo Lipps C. Roxan Hockey, MD [facility] [phone]

## 2021-08-09 ENCOUNTER — Other Ambulatory Visit: Payer: Self-pay | Admitting: Thoracic Surgery (Cardiothoracic Vascular Surgery)

## 2021-08-09 DIAGNOSIS — R911 Solitary pulmonary nodule: Secondary | ICD-10-CM

## 2021-08-11 LAB — SURGICAL PATHOLOGY

## 2021-08-16 ENCOUNTER — Ambulatory Visit: Payer: Medicare Other | Admitting: Thoracic Surgery (Cardiothoracic Vascular Surgery)

## 2021-08-17 NOTE — Discharge Summary (Addendum)
Melrose Nakayama, MD  Physician Cardiothoracic Surgery Discharge Summary    Addendum Date of Service:  08/05/2021  1:28 PM                                                                                                                                                                                                                                                                                                                              Physician Discharge Summary  Patient ID: Perry [name]. MRN: [id number] DOB/AGE: [birth year] 76 y.o.   Admit date: 08/05/2021 Discharge date: 08/07/2020   Admission Diagnoses:       Patient Active Problem List    Diagnosis Date Noted   Lung nodule 08/05/2021   Pulmonary nodule 06/09/2021   History of epistaxis 12/01/2019   Intolerance of continuous positive airway pressure (CPAP) ventilation 12/01/2019   Hypersomnia with sleep apnea 12/01/2019   Other insomnia 12/01/2019   Thoracic aortic aneurysm 05/29/2018   Bradycardia 04/16/2017   Acute appendicitis with perforation and peritoneal abscess 05/04/2014   Abscess of abdominal cavity ([place]) 05/04/2014   Atrial fibrillation ([place]) 04/28/2014   Hyperlipidemia     Sleep apnea     CAD (coronary artery disease)     Hearing loss     Diabetes mellitus ([place])      Discharge Diagnoses: Adenocarcinoma right upper lobe, Clinical and pathologic stage IAc (T1c,No)       Patient Active Problem List    Diagnosis Date Noted   Lung nodule 08/05/2021   S/P Robotic Assisted Right VATS with Posterior Segmentectomy of RLL 08/05/2021   Pulmonary nodule 06/09/2021   History of epistaxis 12/01/2019   Intolerance of continuous positive airway pressure (CPAP) ventilation 12/01/2019   Hypersomnia with sleep apnea 12/01/2019   Other insomnia 12/01/2019   Thoracic aortic aneurysm 05/29/2018   Bradycardia  04/16/2017   Acute appendicitis with perforation and peritoneal abscess 05/04/2014   Abscess of abdominal cavity ([place]) 05/04/2014  Atrial fibrillation ([place]) 04/28/2014   Hyperlipidemia     Sleep apnea     CAD (coronary artery disease)     Hearing loss     Diabetes mellitus ([place])      Discharged Condition: good   History of Present Illness:   Perry [name] is sent for consultation regarding a right upper lobe lung nodule.   Perry [name] is a 76 year old man with a history of remote tobacco use, CAD, MI, CABG in 2004, ascending aortic aneurysm, hypertension, hyperlipidemia, sleep apnea, paroxysmal atrial fibrillation, and glucose intolerance.  He has been followed for a ground glass opacity in the right upper lobe for several years.  It was first noted in 2019 on a Ct done to follow his aneurysm.  He has been followed over time with minimal change from scan to scan but overall slow growth compared to a couple of years ago. He feels well. 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  Denies chest pain, pressure, tightness, shortness of breath. No change in appetite, weight loss. Smoked in past but quit 42 years ago.  It was felt he would benefit from surgical resection of the nodule.  The risks and benefits of the procedure were explained to the patient and he was agreeable to proceed.  Has been seen by cardiology and cleared for surgery.   Hospital Course:   Perry [name] presented to [facility] on 08/05/2021.  He was taken to the operating room and underwent Robotic Assisted Right Video Thoracoscopy with Posterior Segmentectomy of Right Lower Lobe, lymph node dissection, and intercostal nerve block.  He tolerated the procedure without difficulty, was extubated and taken to PACU in stable condition.     Postoperative hospital course:  Patient is remained quite stable.  Chest x-ray appearance on postop day 1 showed trace apical space and chest x-ray revealed no air leak.  Chest tube was removed on  postop day 1.  Oxygen was weaned and he maintains good saturations on room air.  Incisions were noted to be healing well without evidence of infection.  He did have some ecchymosis.  This appeared to be stable and there was no evidence of hematoma.  Final path remains pending.  He did have some mild postoperative constipation and was instructed to use over-the-counter laxative of choice.  He was voiding.  Creatinine remained within normal limits.  He had a very minor expected acute blood loss anemia.  Blood sugars were under adequate control.  At the time of discharge the patient was felt to be quite stable.   Significant Diagnostic Studies: nuclear medicine:    CHEST: Ground-glass nodule of the right upper lobe is unchanged in size compared to prior exam and demonstrates mild FDG uptake which is below mediastinal blood pool, SUV max of 1.6. no hypermetabolic lymph nodes seen in the chest   IMPRESSION: 1. Ground-glass nodule of the right upper lobe demonstrates mild FDG uptake below mediastinal blood pool. Indolent primary lung neoplasm remains a concern and follow-up chest CT in 6 months is recommended. 2. No hypermetabolic lymph nodes seen in the chest. 3. No evidence of metastatic disease in the abdomen or pelvis.    Electronically Signed   By: Yetta Glassman M.D.   On: 06/24/2021 09:16   Treatments: surgery:  Operative Report    DATE OF PROCEDURE: 08/05/2021   PREOPERATIVE DIAGNOSIS:  Right upper lobe lung nodule.   POSTOPERATIVE DIAGNOSIS:  Adenocarcinoma, right upper lobe, clinical stage IA (T1, N0).   PROCEDURE:  Robotic-assisted right upper lobe posterior segmentectomy, lymph node dissection and intercostal nerve blocks levels 3 through 10.   SURGEON:  Revonda Standard. Roxan Hockey, MD   ASSISTANT:  Ellwood Handler.   ANESTHESIA:  General.   FINDINGS:  Frozen section revealed adenocarcinoma stapled and bronchial margins negative for tumor. PATHOLOGY:   Discharge Exam: Blood  pressure 105/68, pulse 70, temperature 98 F (36.7 C), temperature source Oral, resp. rate 18, height 6' (1.829 m), weight 106.6 kg, SpO2 95 %.   General appearance: alert, cooperative, and no distress Heart: regular rate and rhythm and extrasystoles Lungs: dim right base Abdomen: + BS, soft, non tender, non distended Extremities: trace edema Wound: incis with echymosis, no signs of infection     Disposition: Discharge disposition: 01-Home or Self Care             Discharge Instructions       Discharge patient   Complete by: As directed      Discharge disposition: 01-Home or Self Care    Discharge patient date: 08/07/2021         Allergies as of 08/07/2021   No Known Allergies         Medication List       STOP taking these medications     Pfizer COVID-19 Vac Bivalent injection Generic drug: COVID-19 mRNA bivalent vaccine Therapist, music)           TAKE these medications     aspirin 81 MG chewable tablet Commonly known as: HM Aspirin Chew 1 tablet (81 mg total) by mouth daily. What changed: additional instructions    Belsomra 15 MG Tabs Generic drug: Suvorexant TAKE 1 TABLET BY MOUTH AT BEDTIME AS NEEDED    fenofibrate 145 MG tablet Commonly known as: TRICOR TAKE 1 TABLET BY MOUTH DAILY - NEEDS LABWORK BEFORE ANY MORE REFILLS    fish oil-omega-3 fatty acids 1000 MG capsule Take 2 g by mouth daily.    Invokana 100 MG Tabs tablet Generic drug: canagliflozin TAKE 1 TABLET BY MOUTH EVERY DAY BEFORE BREAKFAST    lisinopril 40 MG tablet Commonly known as: ZESTRIL Take 1 tablet (40 mg total) by mouth daily.    metFORMIN 500 MG 24 hr tablet Commonly known as: GLUCOPHAGE-XR TAKE 2 TABLETS BY MOUTH EVERY MORNING - NEEDS LABWORK BEFORE ANY MORE REFILLS    metoprolol succinate 50 MG 24 hr tablet Commonly known as: TOPROL-XL Take 1 tablet (50 mg total) by mouth daily. Take with or immediately following a meal    MULTIVITAMIN ADULT PO Take 1 tablet by mouth  daily.    nitroGLYCERIN 0.4 MG SL tablet Commonly known as: NITROSTAT Place 1 tablet (0.4 mg total) under the tongue every 5 (five) minutes as needed for chest pain.    oxyCODONE 5 MG immediate release tablet Commonly known as: Oxy IR/ROXICODONE Take 1 tablet (5 mg total) by mouth every 6 (six) hours as needed for up to 7 days for moderate pain.    rosuvastatin 10 MG tablet Commonly known as: CRESTOR TAKE 1 TABLET BY MOUTH DAILY             Follow-up Information       Melrose Nakayama, MD Follow up on 08/16/2021.   Specialty: Cardiothoracic Surgery Why: Appointment is at 9:15, please get CXR at 8:45 at [facility] located on first floor of our office building Contact information: [address] [place] [address] [phone]  Signed: John Giovanni, PA-C   08/12/2021, 11:19 AM         Revision History                                    Routing History            Note Details  Author Melrose Nakayama, MD File Time 08/14/2021  4:35 PM  Author Type Physician Status Addendum  Last Editor Melrose Nakayama, MD Service Cardiothoracic [place] # [id number] Admit Date 08/05/2021

## 2021-08-18 ENCOUNTER — Telehealth: Payer: Self-pay | Admitting: Internal Medicine

## 2021-08-18 ENCOUNTER — Encounter: Payer: Self-pay | Admitting: *Deleted

## 2021-08-18 ENCOUNTER — Other Ambulatory Visit: Payer: Self-pay | Admitting: *Deleted

## 2021-08-18 ENCOUNTER — Ambulatory Visit (INDEPENDENT_AMBULATORY_CARE_PROVIDER_SITE_OTHER): Payer: Self-pay | Admitting: Thoracic Surgery (Cardiothoracic Vascular Surgery)

## 2021-08-18 ENCOUNTER — Other Ambulatory Visit: Payer: Self-pay

## 2021-08-18 ENCOUNTER — Ambulatory Visit
Admission: RE | Admit: 2021-08-18 | Discharge: 2021-08-18 | Disposition: A | Discharge status: Home / Self Care | Payer: Medicare Other | Source: Ambulatory Visit | Attending: Thoracic Surgery (Cardiothoracic Vascular Surgery) | Admitting: Thoracic Surgery (Cardiothoracic Vascular Surgery)

## 2021-08-18 VITALS — BP 139/75 | HR 62 | Resp 20 | Ht 72.0 in | Wt 224.0 lb

## 2021-08-18 DIAGNOSIS — Z09 Encounter for follow-up examination after completed treatment for conditions other than malignant neoplasm: Secondary | ICD-10-CM

## 2021-08-18 DIAGNOSIS — J181 Lobar pneumonia, unspecified organism: Secondary | ICD-10-CM | POA: Diagnosis not present

## 2021-08-18 DIAGNOSIS — I77810 Thoracic aortic ectasia: Secondary | ICD-10-CM | POA: Diagnosis not present

## 2021-08-18 DIAGNOSIS — Z902 Acquired absence of lung [part of]: Secondary | ICD-10-CM

## 2021-08-18 DIAGNOSIS — Z951 Presence of aortocoronary bypass graft: Secondary | ICD-10-CM | POA: Diagnosis not present

## 2021-08-18 DIAGNOSIS — R911 Solitary pulmonary nodule: Secondary | ICD-10-CM

## 2021-08-18 DIAGNOSIS — Z9889 Other specified postprocedural states: Secondary | ICD-10-CM | POA: Diagnosis not present

## 2021-08-18 DIAGNOSIS — C3491 Malignant neoplasm of unspecified part of right bronchus or lung: Secondary | ICD-10-CM

## 2021-08-18 NOTE — Telephone Encounter (Signed)
Scheduled appt per 1/12 referral. Pt is aware of appt date and time. Pt is aware to arrive 15 mins prior to appt time.

## 2021-08-18 NOTE — Progress Notes (Signed)
Oncology Nurse Navigator Documentation  Oncology Nurse Navigator Flowsheets 08/18/2021  Abnormal Finding Date 05/20/2021  Confirmed Diagnosis Date 08/05/2021  Diagnosis Status Confirmed Diagnosis Complete  Planned Course of Treatment Surgery  Phase of Treatment Surgery  Navigator Follow Up Date: 08/23/2021  Navigator Follow Up Reason: New Patient Appointment  Navigator Location [facility]  Referral Date to RadOnc/MedOnc 08/18/2021  Navigator Encounter Type Other:  Treatment Initiated Date 08/05/2021  Barriers/Navigation Needs Coordination of Care/I received referral on Mr. Pena today. I updated new patient coordinator to call and schedule him to be seen on 1/17 with Dr. Julien Nordmann and labs before.   Interventions Coordination of Care  Acuity Level 2-Minimal Needs (1-2 Barriers Identified)  Time Spent with Patient 30

## 2021-08-18 NOTE — Progress Notes (Signed)
[address]       [place], [address]             [phone]     HPI: Perry Perry [name] returns for a scheduled postoperative follow-up visit  Perry Perry [name] is a 76 year old Perry [name] with a history of remote tobacco abuse, CAD, MI, remote CABG, ascending aneurysm, hypertension, hyperlipidemia, sleep apnea, paroxysmal atrial fibrillation, and glucose intolerance.  Perry Perry [name] has been followed for a groundglass opacity in the right upper lobe that was noted on a CT in 2019.  Over time it has gradually increased in size  Perry Perry [name] underwent a right upper lobe posterior segmentectomy on 08/05/2021.  Tumor turned out to be a T1, N0, stage Ia adenocarcinoma.  Postoperative course was uncomplicated and went home on postoperative day #2.  Since discharge she has had a dry cough.  Perry Perry [name] has not gotten much relief with over-the-counter cough medications.  Perry Perry [name] tried his sisters Hycodan which also did not help significantly.  Perry Perry [name] has not had any fevers or chills.  Perry Perry [name] has some soreness with coughing but is not taking any narcotics.  His appetite has been poor and Perry Perry [name] has lost about 20 pounds since surgery.  Current Outpatient Medications  Medication Sig Dispense Refill   aspirin (HM ASPIRIN) 81 MG chewable tablet Chew 1 tablet (81 mg total) by mouth daily. (Patient taking differently: Chew 81 mg by mouth daily. EC) 90 tablet 3   BELSOMRA 15 MG TABS TAKE 1 TABLET BY MOUTH AT BEDTIME AS NEEDED 30 tablet 5   canagliflozin (INVOKANA) 100 MG TABS tablet TAKE 1 TABLET BY MOUTH EVERY DAY BEFORE BREAKFAST 30 tablet 0   fenofibrate (TRICOR) 145 MG tablet TAKE 1 TABLET BY MOUTH DAILY - NEEDS LABWORK BEFORE ANY MORE REFILLS 30 tablet 0   fish oil-omega-3 fatty acids 1000 MG capsule Take 2 g by mouth daily.     lisinopril (ZESTRIL) 40 MG tablet Take 1 tablet (40 mg total) by mouth daily. 90 tablet 0   metFORMIN (GLUCOPHAGE-XR) 500 MG 24 hr tablet TAKE 2 TABLETS BY MOUTH EVERY MORNING - NEEDS LABWORK BEFORE ANY MORE REFILLS  60 tablet 0   metoprolol succinate (TOPROL-XL) 50 MG 24 hr tablet Take 1 tablet (50 mg total) by mouth daily. Take with or immediately following a meal 90 tablet 1   Multiple Vitamin (MULTIVITAMIN ADULT PO) Take 1 tablet by mouth daily.     nitroGLYCERIN (NITROSTAT) 0.4 MG SL tablet Place 1 tablet (0.4 mg total) under the tongue every 5 (five) minutes as needed for chest pain. 25 tablet 5   rosuvastatin (CRESTOR) 10 MG tablet TAKE 1 TABLET BY MOUTH DAILY 30 tablet 0   No current facility-administered medications for this visit.    Physical Exam BP 139/75    Pulse 62    Resp 20    Ht 6' (1.829 m)    Wt 224 lb (101.6 kg)    SpO2 97% Comment: RA   BMI 30.Perry kg/m  76 year old Perry [name] in no acute distress Alert and oriented x3 with no focal deficits Lungs clear bilaterally Cardiac regular rate and rhythm No peripheral edema Incisions clean dry and intact  Diagnostic Tests: I personally reviewed the chest x-ray.  There are some postoperative changes in the posterior aspect of the right upper lobe.  Impression: Perry Perry [name] is a Perry Perry [name] who was found to have a groundglass opacity in the right upper lobe a few years ago on a CT done  to follow an ascending aneurysm.  Over time that nodule became a little more prominent and grew slightly.  Perry Perry [name] underwent a right upper lobe posterior segmentectomy and the nodule turned out to be a stage Ia adenocarcinoma as suspected.  Status post right upper lobe posterior segmentectomy-continues to do extremely well.  Does have a cough which is common after pulmonary surgery.  Should resolve with time.  If it worsens we can give him a prescription for a stronger cough medication but for now Perry Perry [name] is tolerating it.  Not taking any narcotics for pain.  Perry Perry [name] may begin resuming activities.  I advised him to do this on a gradual basis.  Perry Perry [name] can drive on a limited basis.  Appropriate precautions were discussed.  I am going to refer him to Dr. Julien Nordmann at our  multidisciplinary thoracic oncology clinic.  Perry Perry [name] should not need any adjuvant therapy.  Perry Perry [name] will need long-term follow-up.  Ascending aneurysm-4.1 cm.  Needs continued annual CT scan follow-up.  Can be combined with his oncology follow-up.  Plan: Referral to Dr. Julien Nordmann Return in 2 months with PA lateral chest x-ray to check on progress Patient knows to call if Perry Perry [name] has any worsening pain or cough in the interim.  Perry Nakayama, MD [facility] [phone]

## 2021-08-18 NOTE — Progress Notes (Signed)
The proposed treatment discussed in conference is for discussion purpose only and is not a binding recommendation. The patient was not been physically examined, or presented with their treatment options. Therefore, final treatment plans cannot be decided.  

## 2021-08-19 ENCOUNTER — Telehealth: Payer: Self-pay | Admitting: Medical Oncology

## 2021-08-19 NOTE — Telephone Encounter (Signed)
Confirmed pt appts for next week.

## 2021-08-23 ENCOUNTER — Other Ambulatory Visit: Payer: Self-pay

## 2021-08-23 ENCOUNTER — Inpatient Hospital Stay: Payer: Medicare Other

## 2021-08-23 ENCOUNTER — Inpatient Hospital Stay (HOSPITAL_BASED_OUTPATIENT_CLINIC_OR_DEPARTMENT_OTHER): Payer: Medicare Other | Admitting: Internal Medicine

## 2021-08-23 ENCOUNTER — Encounter: Payer: Self-pay | Admitting: Internal Medicine

## 2021-08-23 ENCOUNTER — Inpatient Hospital Stay: Payer: Medicare Other | Admitting: Internal Medicine

## 2021-08-23 ENCOUNTER — Encounter: Payer: Self-pay | Admitting: *Deleted

## 2021-08-23 ENCOUNTER — Inpatient Hospital Stay: Payer: Medicare Other | Attending: Internal Medicine

## 2021-08-23 VITALS — BP 136/80 | HR 62 | Temp 97.7°F | Resp 19 | Ht 72.0 in | Wt 225.0 lb

## 2021-08-23 DIAGNOSIS — E119 Type 2 diabetes mellitus without complications: Secondary | ICD-10-CM | POA: Diagnosis not present

## 2021-08-23 DIAGNOSIS — Z87891 Personal history of nicotine dependence: Secondary | ICD-10-CM | POA: Diagnosis not present

## 2021-08-23 DIAGNOSIS — I110 Hypertensive heart disease with heart failure: Secondary | ICD-10-CM | POA: Diagnosis not present

## 2021-08-23 DIAGNOSIS — Z79899 Other long term (current) drug therapy: Secondary | ICD-10-CM | POA: Insufficient documentation

## 2021-08-23 DIAGNOSIS — I252 Old myocardial infarction: Secondary | ICD-10-CM | POA: Insufficient documentation

## 2021-08-23 DIAGNOSIS — I509 Heart failure, unspecified: Secondary | ICD-10-CM | POA: Diagnosis not present

## 2021-08-23 DIAGNOSIS — C3411 Malignant neoplasm of upper lobe, right bronchus or lung: Secondary | ICD-10-CM | POA: Diagnosis not present

## 2021-08-23 DIAGNOSIS — Z7982 Long term (current) use of aspirin: Secondary | ICD-10-CM | POA: Diagnosis not present

## 2021-08-23 DIAGNOSIS — Z7984 Long term (current) use of oral hypoglycemic drugs: Secondary | ICD-10-CM

## 2021-08-23 DIAGNOSIS — Z902 Acquired absence of lung [part of]: Secondary | ICD-10-CM | POA: Insufficient documentation

## 2021-08-23 DIAGNOSIS — I4891 Unspecified atrial fibrillation: Secondary | ICD-10-CM | POA: Insufficient documentation

## 2021-08-23 DIAGNOSIS — C3491 Malignant neoplasm of unspecified part of right bronchus or lung: Secondary | ICD-10-CM

## 2021-08-23 LAB — CMP (CANCER CENTER ONLY)
ALT: 17 U/L (ref 0–44)
AST: 19 U/L (ref 15–41)
Albumin: 3.8 g/dL (ref 3.5–5.0)
Alkaline Phosphatase: 81 U/L (ref 38–126)
Anion gap: 8 (ref 5–15)
BUN: 27 mg/dL — ABNORMAL HIGH (ref 8–23)
CO2: 25 mmol/L (ref 22–32)
Calcium: 9.9 mg/dL (ref 8.9–10.3)
Chloride: 105 mmol/L (ref 98–111)
Creatinine: 1 mg/dL (ref 0.61–1.24)
GFR, Estimated: >60 mL/min (ref >60)
Glucose, Bld: 194 mg/dL — ABNORMAL HIGH (ref 70–99)
Potassium: 4.2 mmol/L (ref 3.5–5.1)
Sodium: 138 mmol/L (ref 135–145)
Total Bilirubin: 0.4 mg/dL (ref 0.3–1.2)
Total Protein: 7.1 g/dL (ref 6.5–8.1)

## 2021-08-23 LAB — CBC WITH DIFFERENTIAL (CANCER CENTER ONLY)
Abs Immature Granulocytes: 0.04 10*3/uL (ref 0.00–0.07)
Basophils Absolute: 0 10*3/uL (ref 0.0–0.1)
Basophils Relative: 1 %
Eosinophils Absolute: 0.2 10*3/uL (ref 0.0–0.5)
Eosinophils Relative: 3 %
HCT: 36.8 % — ABNORMAL LOW (ref 39.0–52.0)
Hemoglobin: 12.4 g/dL — ABNORMAL LOW (ref 13.0–17.0)
Immature Granulocytes: 1 %
Lymphocytes Relative: 26 %
Lymphs Abs: 1.5 10*3/uL (ref 0.7–4.0)
MCH: 31.1 pg (ref 26.0–34.0)
MCHC: 33.7 g/dL (ref 30.0–36.0)
MCV: 92.2 fL (ref 80.0–100.0)
Monocytes Absolute: 0.6 10*3/uL (ref 0.1–1.0)
Monocytes Relative: 11 %
Neutro Abs: 3.4 10*3/uL (ref 1.7–7.7)
Neutrophils Relative %: 58 %
Platelet Count: 316 10*3/uL (ref 150–400)
RBC: 3.99 MIL/uL — ABNORMAL LOW (ref 4.22–5.81)
RDW: 12.1 % (ref 11.5–15.5)
WBC Count: 5.7 10*3/uL (ref 4.0–10.5)
nRBC: 0 % (ref 0.0–0.2)

## 2021-08-23 NOTE — Progress Notes (Signed)
Cardiology Office Note:    Date:  08/24/2021   ID:  Perry Fick., DOB Mar 27, 1946, MRN [id number]  PCP:  Darreld Mclean, MD  Cardiologist:  Sinclair Grooms, MD   Referring MD: Darreld Mclean, MD   Chief Complaint  Patient presents with   Coronary Artery Disease    History of Present Illness:    Perry Galan. is a 76 y.o. male with a hx of asthma, hyperlipidemia, PAF, coronary artery disease, status post coronary artery bypass graft 2004, thoracic aortic aneurysm, hypertension, diabetes mellitus type II, obstructive sleep apnea, and dyslipidemia.   Recent diagnosis adenocarcinoma of right lung. Seeing Dr. Curt Bears. Underwent RUL posterior segmentectomy by Dr. Roxan Hockey 08/05/2021.  He denies angina.  He is healed up from his right upper lobe segmentectomy by Dr. Roxan Hockey.  He had no cardiac complications during the procedure.  CT scan demonstrates that the aorta reveals a mildly dilated ascending aorta.  When seen by Perry [name] earlier this year, a left carotid bruit was heard.  The carotid Doppler reveals bilateral less than 39% stenosis.  Perry [name] this in 1 year.  As preop for the pulmonary surgery, he underwent nuclear stress testing that was unremarkable for evidence of ischemia and documented an EF of greater than 50%  Past Medical History:  Diagnosis Date   A-fib ([place]) 04/07/2014   Abscess of abdominal cavity ([place]) 05/04/2014   Acute appendicitis with perforation and peritoneal abscess 05/04/2014   Allergy    Anginal pain ([place])    CAD (coronary artery disease)    CAD (coronary artery disease) of artery bypass graft 05/04/2014   Cancer ([place]) 07/2021   lung   CHF (congestive heart failure) ([place])    pt not aware   Diabetes mellitus    not on medication - pt not aware of this   Dyslipidemia 05/04/2014   Dysrhythmia    Hearing loss    mild   Hernia, inguinal    right   Hyperglycemia    Hypertension    Ileus, postoperative ([place])  05/04/2014   Lumbar disc disease    Old inferior wall myocardial infarction 08/07/2002    Past Surgical History:  Procedure Laterality Date   COLONOSCOPY     CORONARY ANGIOPLASTY     multiple   CORONARY ARTERY BYPASS GRAFT  2004   LIMA to LAD and SVG to RCA (emergent)   INTERCOSTAL NERVE BLOCK Right 08/05/2021   Procedure: INTERCOSTAL NERVE BLOCK;  Surgeon: Melrose Nakayama, MD;  Location: [place];  Service: Thoracic;  Laterality: Right;   LAPAROSCOPIC APPENDECTOMY N/A 04/21/2014   Procedure: APPENDECTOMY LAPAROSCOPIC;  Surgeon: Rolm Bookbinder, MD;  Location: [place];  Service: General;  Laterality: N/A;   LAPAROSCOPIC APPENDECTOMY N/A 06/24/2014   Procedure: APPENDECTOMY LAPAROSCOPIC ;  Surgeon: Rolm Bookbinder, MD;  Location: [place];  Service: General;  Laterality: N/A;   LUMBAR LAMINECTOMY  2001   lumbar   LYMPH NODE DISSECTION Right 08/05/2021   Procedure: LYMPH NODE DISSECTION;  Surgeon: Melrose Nakayama, MD;  Location: [facility];  Service: Thoracic;  Laterality: Right;   TONSILLECTOMY     XI ROBOTIC ASSISTED THORACOSCOPY- SEGMENTECTOMY Right 08/05/2021   Procedure: XI ROBOTIC ASSISTED THORACOSCOPY- RIGHT UPPER LOBE POSTERIOR SEGMENTECTOMY, possible lobectomy;  Surgeon: Melrose Nakayama, MD;  Location: [facility];  Service: Thoracic;  Laterality: Right;    Current Medications: Current Meds  Medication Sig   aspirin (HM ASPIRIN) 81 MG chewable tablet Chew 1 tablet (  81 mg total) by mouth daily. (Patient taking differently: Chew 81 mg by mouth daily. EC)   BELSOMRA 15 MG TABS TAKE 1 TABLET BY MOUTH AT BEDTIME AS NEEDED   canagliflozin (INVOKANA) 100 MG TABS tablet TAKE 1 TABLET BY MOUTH EVERY DAY BEFORE BREAKFAST   fenofibrate (TRICOR) 145 MG tablet TAKE 1 TABLET BY MOUTH DAILY - NEEDS LABWORK BEFORE ANY MORE REFILLS   fish oil-omega-3 fatty acids 1000 MG capsule Take 2 g by mouth daily.   lisinopril (ZESTRIL) 40 MG tablet Take 1 tablet (40 mg total) by mouth daily.    metFORMIN (GLUCOPHAGE-XR) 500 MG 24 hr tablet TAKE 2 TABLETS BY MOUTH EVERY MORNING - NEEDS LABWORK BEFORE ANY MORE REFILLS   metoprolol succinate (TOPROL-XL) 50 MG 24 hr tablet Take 1 tablet (50 mg total) by mouth daily. Take with or immediately following a meal   Multiple Vitamin (MULTIVITAMIN ADULT PO) Take 1 tablet by mouth daily.   nitroGLYCERIN (NITROSTAT) 0.4 MG SL tablet Place 1 tablet (0.4 mg total) under the tongue every 5 (five) minutes as needed for chest pain.   rosuvastatin (CRESTOR) 10 MG tablet TAKE 1 TABLET BY MOUTH DAILY     Allergies:   Patient has no known allergies.   Social History   Socioeconomic History   Marital status: Married    Spouse name: Perry [name]   Number of children: 2   Years of education: Masters   Highest education level: Not on file  Occupational History   Occupation: Clinical biochemist  Tobacco Use   Smoking status: Former    Types: Cigarettes    Quit date: 06/24/1979    Years since quitting: 42.1   Smokeless tobacco: Former    Types: Nurse, children's Use: Never used  Substance and Sexual Activity   Alcohol use: Yes    Alcohol/week: 14.0 standard drinks    Types: 14 Cans of beer per week    Comment: 2 drinks a day   Drug use: No   Sexual activity: Yes  Other Topics Concern   Not on file  Social History Narrative   Patient is married Perry [name]) and lives at home with his wife.   Patient has two adult children.   Patient is working full-time.   Patient has a Master's degree.   Patient drinks one cup of coffee every morning.   Patient is right-handed.   Social Determinants of Health   Financial Resource Strain: Not on file  Food Insecurity: Not on file  Transportation Needs: Not on file  Physical Activity: Not on file  Stress: Not on file  Social Connections: Not on file     Family History: The patient's family history includes Cancer in his sister; Heart disease in his brother, father, and paternal grandmother; Hypertension  in his brother.  ROS:   Please see the history of present illness.    Soreness from the surgical procedure has resolved.  He is exceptionally happy with the care provided by Perry [name].  He denies neurological symptoms.  He wonders when he should have additional cardiac testing.  We discussed optimal risk factor modification and physical activity as are mode of management unless he develops symptoms.  All other systems reviewed and are negative.  EKGs/Labs/Other Studies Reviewed:    The following studies were reviewed today: Bilateral Carotid Doppler 07/18/21 Summary:  Right Carotid: Velocities in the right ICA are consistent with a 1-39%  stenosis.  Prominent proximal RICA, measuring 1.2 cm AP x 1.2 cm TRV.   Left Carotid: Velocities in the left ICA are consistent with a 1-39%  stenosis.                Prominent proximal LICA, measuring 1.2 cm AP x 1.2 cm TRV.   Vertebrals:  Bilateral vertebral arteries demonstrate antegrade flow.  Subclavians: Normal flow hemodynamics were seen in bilateral subclavian               arteries.   *See table(s) above for measurements and observations.   CTA Aorta 05/2021 IMPRESSION: 1. Stable mild aneurysmal disease of the ascending thoracic aorta measuring up to 4.1 cm in estimated maximal diameter. Recommend annual imaging followup by CTA or MRA. This recommendation follows 2010 ACCF/AHA/AATS/ACR/ASA/SCA/SCAI/SIR/STS/SVM Guidelines for the Diagnosis and Management of Patients with Thoracic Aortic Disease. Circulation. 2010; 121: G182-X937. Aortic aneurysm NOS (ICD10-I71.9) 2. Enlarging ground-glass opacity in the posterior right upper lobe which remains difficult to measure and does not have a definitive solid nodular component. Region of maximal extent now measures roughly 18-19 mm compared to 15 mm previously. Recommend further multi disciplinary thoracic oncologic referral ([place]) for further workup and consideration of  resection versus biopsy. At the size of this current abnormality, a PET scan may also be useful for further workup.  Stress Test 07/2021: Highlights      Findings are consistent with prior myocardial infarction. The study is low risk.   No ST deviation was noted.   LV perfusion is abnormal. There is no evidence of ischemia. There is evidence of infarction. Defect 1: There is a medium defect with moderate reduction in uptake present in the apical to basal inferior location(s) that is fixed. There is abnormal wall motion in the defect area. Consistent with infarction.   Left ventricular function is abnormal. Global function is mildly reduced. Nuclear stress EF: 51 %. The left ventricular ejection fraction is mildly decreased (45-54%). End diastolic cavity size is mildly enlarged. End systolic cavity size is mildly enlarged.   Prior study available for comparison from 04/11/2019.   Fixed inferior perfusion defect with inferior hypokinesis, consistent with infarct Mild systolic dysfunction (EF 16%) Low risk study.  No ischemia   EKG:  EKG not repeated  Recent Labs: 08/23/2021: ALT 17; BUN 27; Creatinine 1.00; Hemoglobin 12.4; Platelet Count 316; Potassium 4.2; Sodium 138  Recent Lipid Panel    Component Value Date/Time   CHOL 114 05/30/2021 1529   CHOL 130 08/20/2020 0950   TRIG 176.0 (H) 05/30/2021 1529   HDL 37.80 (L) 05/30/2021 1529   HDL 40 08/20/2020 0950   CHOLHDL 3 05/30/2021 1529   VLDL 35.2 05/30/2021 1529   LDLCALC 41 05/30/2021 1529   LDLCALC 71 08/20/2020 0950   LDLCALC 54 04/21/2020 0843    Physical Exam:    VS:  BP 102/70    Pulse 61    Ht 6' (1.829 m)    Wt 224 lb 12.8 oz (102 kg)    SpO2 97%    BMI 30.49 kg/m     Wt Readings from Last 3 Encounters:  08/24/21 224 lb 12.8 oz (102 kg)  08/23/21 225 lb (102.1 kg)  08/18/21 224 lb (101.6 kg)     GEN: Healthy in appearance with good skin:. No acute distress HEENT: Normal NECK: No JVD. LYMPHATICS: No  lymphadenopathy CARDIAC: No murmur. RRR no gallop, or edema. VASCULAR:  Normal Pulses. No bruits. RESPIRATORY:  Clear to auscultation without rales, wheezing or  rhonchi  ABDOMEN: Soft, non-tender, non-distended, No pulsatile mass, MUSCULOSKELETAL: No deformity  SKIN: Warm and dry NEUROLOGIC:  Alert and oriented x 3 PSYCHIATRIC:  Normal affect   ASSESSMENT:    1. Coronary artery disease involving coronary bypass graft of native heart without angina pectoris   2. Dyspnea on exertion   3. Paroxysmal atrial fibrillation (HCC)   4. Thoracic aortic aneurysm without rupture, unspecified part   5. Essential hypertension   6. OSA (obstructive sleep apnea)   7. Controlled type 2 diabetes mellitus with other circulatory complication, without long-term current use of insulin (Garza-[place] II)   8. Right carotid bruit   9. Abnormal Doppler ultrasound of carotid artery    PLAN:    In order of problems listed above:  Secondary prevention reviewed.  See below. Improved.  If continues to be an issue, consider 2D Doppler echocardiogram to assess diastolic function.  Last echo 2015. No palpitations or episodes of atrial fibrillation. Ascending aneurysm measuring 4.1 cm.  Repeat CTA in late September 2023. Target blood pressure 130/80 mmHg or less. CPAP is recommended. Needs more aggressive control of hemoglobin A1c.  He needs dietary alteration. Will need follow-up Doppler study done in October 2023.  Overall education and awareness concerning secondary risk prevention was discussed in detail: LDL less than 70, hemoglobin A1c less than 7, blood pressure target less than 130/80 mmHg, >150 minutes of moderate aerobic activity per week, avoidance of smoking, weight control (via diet and exercise), and continued surveillance/management of/for obstructive sleep apnea.    Medication Adjustments/Labs and Tests Ordered: Current medicines are reviewed at length with the patient today.  Concerns regarding medicines  are outlined above.  Orders Placed This Encounter  Procedures   VAS US CAROTID   No orders of the defined types were placed in this encounter.   Patient Instructions  Medication Instructions:  Your physician recommends that you continue on your current medications as directed. Please refer to the Current Medication list given to you today.  *If you need a refill on your cardiac medications before your next appointment, please call your pharmacy*   Lab Work: none If you have labs (blood work) drawn today and your tests are completely normal, you will receive your results only by: [place] (if you have MyChart) OR A paper copy in the mail If you have any lab test that is abnormal or we need to change your treatment, we will call you to review the results.   Testing/Procedures: Prior to your October 2024 appt with Dr. Tamala Julian  Your physician has requested that you have a carotid duplex. This test is an ultrasound of the carotid arteries in your neck. It looks at blood flow through these arteries that supply the brain with blood. Allow one hour for this exam. There are no restrictions or special instructions.  CT of the Aorta late September 2024     Follow-Up: At [facility], you and your health needs are our priority.  As part of our continuing mission to provide you with exceptional heart care, we have created designated Provider Care Teams.  These Care Teams include your primary Cardiologist (physician) and Advanced Practice Providers (APPs -  Physician Assistants and Nurse Practitioners) who all work together to provide you with the care you need, when you need it.  We recommend signing up for the patient portal called "MyChart".  Sign up information is provided on this After Visit Summary.  MyChart is used to connect with patients for Virtual Visits (  Telemedicine).  Patients are able to view lab/test results, encounter notes, upcoming appointments, etc.  Non-urgent messages  can be sent to your provider as well.   To learn more about what you can do with MyChart, go to [email or web address].    Your next appointment:   9 month(s)  The format for your next appointment:   In Person  Provider:   Sinclair Grooms, MD     Other Instructions     Signed, Sinclair Grooms, MD  08/24/2021 9:25 AM    [place]

## 2021-08-23 NOTE — Progress Notes (Signed)
Oncology Nurse Navigator Documentation  Oncology Nurse Navigator Flowsheets 08/23/2021 08/18/2021  Abnormal Finding Date - 05/20/2021  Confirmed Diagnosis Date - 08/05/2021  Diagnosis Status - Confirmed Diagnosis Complete  Planned Course of Treatment - Surgery  Phase of Treatment - Surgery  Navigator Follow Up Date: - 08/23/2021  Navigator Follow Up Reason: - New Patient Appointment  Navigator Location [place]  Referral Date to RadOnc/MedOnc - 08/18/2021  Navigator Encounter Type Telephone Other:  Telephone Outgoing Call/per Dr. Julien Nordmann, I called Mr. Bouse and changed his appt. I called and spoke to Mr. Maxwell Caul and updated him on his appt change.  He verbalized understanding.  -  Treatment Initiated Date - 08/05/2021  Barriers/Navigation Needs Coordination of Care Coordination of Care  Interventions Coordination of Care Coordination of Care  Acuity Level 2-Minimal Needs (1-2 Barriers Identified) Level 2-Minimal Needs (1-2 Barriers Identified)  Coordination of Care Appts -  Time Spent with Patient 15 30

## 2021-08-23 NOTE — Progress Notes (Signed)
Oncology Nurse Navigator Documentation  Oncology Nurse Navigator Flowsheets 08/23/2021 08/23/2021 08/18/2021  Abnormal Finding Date - - 05/20/2021  Confirmed Diagnosis Date - - 08/05/2021  Diagnosis Status - - Confirmed Diagnosis Complete  Planned Course of Treatment - - Surgery  Phase of Treatment - - Surgery  Navigator Follow Up Date: 01/09/2022 - 08/23/2021  Navigator Follow Up Reason: Appointment Review - New Patient Appointment  Navigator Location [place]  Referral Date to RadOnc/MedOnc - - 08/18/2021  Navigator Encounter Type Clinic/MDC;Initial MedOnc Telephone Other:  Telephone - Outgoing Call -  Treatment Initiated Date - - 08/05/2021  Patient Visit Type Initial;MedOnc - -  Treatment Phase Other - -  Barriers/Navigation Needs Education Coordination of Care Coordination of Care  Education Newly Diagnosed Cancer Education;Other/I met Perry [name] today at his first visit to see med onc.  He is a very nice man who has hx NSCLC s/p resection.  His plan of care is observation and follow up in 6 months. Patient verbalized understanding. I did give him my phone number to call incase he has questions.  I gave information to him regarding lung cancer and resources if he needs them.  - -  Interventions Education;Psycho-Social Support Coordination of Care Coordination of Care  Acuity Level 3-Moderate Needs (3-4 Barriers Identified) Level 2-Minimal Needs (1-2 Barriers Identified) Level 2-Minimal Needs (1-2 Barriers Identified)  Coordination of Care - Appts -  Education Method Verbal;Written - -  Time Spent with Patient 15 61 53

## 2021-08-23 NOTE — Progress Notes (Signed)
[facility] [phone]   [phone]  CONSULT NOTE  REFERRING PHYSICIAN: Dr. Modesto Charon  REASON FOR CONSULTATION:  76 years old white male recently diagnosed with lung cancer  HPI Devontaye Ground. is a 76 y.o. male with past medical history significant for hypertension, congestive heart failure, diabetes mellitus, dyslipidemia, coronary artery disease, atrial fibrillation, postobstructive ileus, ascending aortic aneurysm as well as lumbar disc disease.  The patient was followed for groundglass opacity in the right upper lobe for several years seen initially on CT scan of the chest for evaluation of the ascending aortic aneurysm in 2019.  CT scan of the chest on May 21, 2020 showed 1.5 cm poorly marginated groundglass opacity in the posterior right upper lobe stable from the previous imaging studies.  Repeat CT scan of the chest on May 19, 2021 showed enlarging groundglass opacity in the posterior right upper lobe measuring 1.9 cm.  The patient had a PET scan on June 23, 2021 and that showed groundglass nodule of the right upper lobe with mild FDG uptake.  Indolent primary lung neoplasm remained a concern.  There was no hypermetabolic lymph nodes or evidence of metastatic disease in the abdomen or pelvis.  The patient was seen by Dr. Roxan Hockey and on August 05, 2021 he underwent robotic assisted right upper lobe posterior segmentectomy with lymph node dissection. The final pathology ([id number]) showed invasive well differentiated adenocarcinoma measuring 2.5 cm with negative resection margin.  There was no evidence for visceral pleural or lymphovascular invasion.  The dissected lymph nodes were negative for malignancy. Dr. Roxan Hockey kindly referred the patient to me today for evaluation and recommendation regarding monitoring of his history of lung cancer and any additional treatment if needed. When seen today the patient is  feeling fine with no concerning complaints except for mild cough and soreness on the right side of the chest as well as shortness of breath with exertion.  He denied having any hemoptysis.  He has no nausea, vomiting, diarrhea or constipation.  Has occasional headache with the cough but no visual changes.  He has no weight loss or night sweats. Family history significant for father with heart disease.  Mother died at age 17 with old age.  Brother had lung cancer and sister had lung cancer and jaw cancer. The patient is married and has 2 children a son and daughter.  He used to work as a Clinical biochemist and currently raising cows.  He has a history of smoking for around 15 years but quit in 1980.  He drinks 1-2 alcoholic drinks every night.  He has no history of drug abuse.  HPI  Past Medical History:  Diagnosis Date   A-fib ([place]) 04/07/2014   Abscess of abdominal cavity ([place]) 05/04/2014   Acute appendicitis with perforation and peritoneal abscess 05/04/2014   Allergy    Anginal pain ([facility])    CAD (coronary artery disease)    CAD (coronary artery disease) of artery bypass graft 05/04/2014   Cancer ([facility]) 07/2021   lung   CHF (congestive heart failure) ([facility])    pt not aware   Diabetes mellitus    not on medication - pt not aware of this   Dyslipidemia 05/04/2014   Dysrhythmia    Hearing loss    mild   Hernia, inguinal    right   Hyperglycemia    Hypertension    Ileus, postoperative ([place] [place]) 05/04/2014   Lumbar disc disease    Old  inferior wall myocardial infarction 08/07/2002    Past Surgical History:  Procedure Laterality Date   COLONOSCOPY     CORONARY ANGIOPLASTY     multiple   CORONARY ARTERY BYPASS GRAFT  2004   LIMA to LAD and SVG to RCA (emergent)   INTERCOSTAL NERVE BLOCK Right 08/05/2021   Procedure: INTERCOSTAL NERVE BLOCK;  Surgeon: Melrose Nakayama, MD;  Location: [facility];  Service: Thoracic;  Laterality: Right;   LAPAROSCOPIC APPENDECTOMY N/A 04/21/2014    Procedure: APPENDECTOMY LAPAROSCOPIC;  Surgeon: Rolm Bookbinder, MD;  Location: [facility];  Service: General;  Laterality: N/A;   LAPAROSCOPIC APPENDECTOMY N/A 06/24/2014   Procedure: APPENDECTOMY LAPAROSCOPIC ;  Surgeon: Rolm Bookbinder, MD;  Location: [facility];  Service: General;  Laterality: N/A;   LUMBAR LAMINECTOMY  2001   lumbar   LYMPH NODE DISSECTION Right 08/05/2021   Procedure: LYMPH NODE DISSECTION;  Surgeon: Melrose Nakayama, MD;  Location: [facility];  Service: Thoracic;  Laterality: Right;   TONSILLECTOMY     XI ROBOTIC ASSISTED THORACOSCOPY- SEGMENTECTOMY Right 08/05/2021   Procedure: XI ROBOTIC ASSISTED THORACOSCOPY- RIGHT UPPER LOBE POSTERIOR SEGMENTECTOMY, possible lobectomy;  Surgeon: Melrose Nakayama, MD;  Location: [facility];  Service: Thoracic;  Laterality: Right;    Family History  Problem Relation Age of Onset   Heart disease Father    Heart disease Brother    Hypertension Brother    Cancer Sister    Heart disease Paternal Grandmother     Social History Social History   Tobacco Use   Smoking status: Former    Types: Cigarettes    Quit date: 06/24/1979    Years since quitting: 42.1   Smokeless tobacco: Former    Types: Chew  Vaping Use   Vaping Use: Never used  Substance Use Topics   Alcohol use: Yes    Alcohol/week: 14.0 standard drinks    Types: 14 Cans of beer per week    Comment: 2 drinks a day   Drug use: No    No Known Allergies  Current Outpatient Medications  Medication Sig Dispense Refill   aspirin (HM ASPIRIN) 81 MG chewable tablet Chew 1 tablet (81 mg total) by mouth daily. (Patient taking differently: Chew 81 mg by mouth daily. EC) 90 tablet 3   BELSOMRA 15 MG TABS TAKE 1 TABLET BY MOUTH AT BEDTIME AS NEEDED 30 tablet 5   canagliflozin (INVOKANA) 100 MG TABS tablet TAKE 1 TABLET BY MOUTH EVERY DAY BEFORE BREAKFAST 30 tablet 0   fenofibrate (TRICOR) 145 MG tablet TAKE 1 TABLET BY MOUTH DAILY - NEEDS LABWORK BEFORE ANY MORE REFILLS 30  tablet 0   fish oil-omega-3 fatty acids 1000 MG capsule Take 2 g by mouth daily.     lisinopril (ZESTRIL) 40 MG tablet Take 1 tablet (40 mg total) by mouth daily. 90 tablet 0   metFORMIN (GLUCOPHAGE-XR) 500 MG 24 hr tablet TAKE 2 TABLETS BY MOUTH EVERY MORNING - NEEDS LABWORK BEFORE ANY MORE REFILLS 60 tablet 0   metoprolol succinate (TOPROL-XL) 50 MG 24 hr tablet Take 1 tablet (50 mg total) by mouth daily. Take with or immediately following a meal 90 tablet 1   Multiple Vitamin (MULTIVITAMIN ADULT PO) Take 1 tablet by mouth daily.     nitroGLYCERIN (NITROSTAT) 0.4 MG SL tablet Place 1 tablet (0.4 mg total) under the tongue every 5 (five) minutes as needed for chest pain. 25 tablet 5   rosuvastatin (CRESTOR) 10 MG tablet TAKE 1 TABLET BY MOUTH DAILY  30 tablet 0   No current facility-administered medications for this visit.    Review of Systems  Constitutional: negative Eyes: negative Ears, nose, mouth, throat, and face: negative Respiratory: positive for cough, dyspnea on exertion, and pleurisy/chest pain Cardiovascular: negative Gastrointestinal: negative Genitourinary:negative Integument/breast: negative Hematologic/lymphatic: negative Musculoskeletal:negative Neurological: positive for headaches Behavioral/Psych: negative Endocrine: negative Allergic/Immunologic: negative  Physical Exam  DJM:EQAST, healthy, no distress, well nourished, and well developed SKIN: skin color, texture, turgor are normal, no rashes or significant lesions HEAD: Normocephalic, No masses, lesions, tenderness or abnormalities EYES: normal, PERRLA, Conjunctiva are pink and non-injected EARS: External ears normal, Canals clear OROPHARYNX:no exudate, no erythema, and lips, buccal mucosa, and tongue normal  NECK: supple, no adenopathy, no JVD LYMPH:  no palpable lymphadenopathy, no hepatosplenomegaly LUNGS: clear to auscultation , and palpation HEART: regular rate & rhythm, no murmurs, and no  gallops ABDOMEN:abdomen soft, non-tender, normal bowel sounds, and no masses or organomegaly BACK: Back symmetric, no curvature., No CVA tenderness EXTREMITIES:no joint deformities, effusion, or inflammation, no edema  NEURO: alert & oriented x 3 with fluent speech, no focal motor/sensory deficits  PERFORMANCE STATUS: ECOG 1  LABORATORY DATA: Lab Results  Component Value Date   WBC 8.2 08/07/2021   HGB 12.2 (L) 08/07/2021   HCT 37.0 (L) 08/07/2021   MCV 95.6 08/07/2021   PLT 163 08/07/2021      Chemistry      Component Value Date/Time   NA 135 08/07/2021 0123   NA 139 08/20/2020 0950   K 3.8 08/07/2021 0123   CL 105 08/07/2021 0123   CO2 25 08/07/2021 0123   BUN 35 (H) 08/07/2021 0123   BUN 36 (H) 08/20/2020 0950   CREATININE 1.23 08/07/2021 0123   CREATININE 1.23 (H) 04/21/2020 0843   GLU 133 05/07/2017 0000      Component Value Date/Time   CALCIUM 9.0 08/07/2021 0123   ALKPHOS 39 08/07/2021 0123   AST 33 08/07/2021 0123   ALT 37 08/07/2021 0123   BILITOT 0.4 08/07/2021 0123   BILITOT 0.5 08/20/2020 0950       RADIOGRAPHIC STUDIES: DG Chest 2 View  Result Date: 08/18/2021 CLINICAL DATA:  Hx of right lung surg 08/05/2021, stat reading, TCTS pt EXAM: CHEST - 2 VIEW COMPARISON:  08/07/2021 FINDINGS: Persistent focal parenchymal consolidation in the posterolateral right upper lobe lateral to staple line. Left lung clear. Heart size and mediastinal contours are within normal limits. CABG markers. Tortuous ectatic thoracic aorta. No effusion.  No pneumothorax. Sternotomy wires.  Chronic left rib posttraumatic changes. IMPRESSION: Persistent posterolateral right upper lobe  consolidation. Electronically Signed   By: Lucrezia Europe M.D.   On: 08/18/2021 08:38   DG Chest 2 View  Result Date: 08/07/2021 CLINICAL DATA:  Status post right upper lobe posterior segmentectomy. EXAM: CHEST - 2 VIEW COMPARISON:  08/06/2021 FINDINGS: Previous median sternotomy and CABG procedure. Stable  cardiomediastinal contours. Interval removal of right chest tube. No appreciable pneumothorax identified. New opacity is identified adjacent to the right upper lobe suture chain along the previous chest tube track. Left lung is clear. IMPRESSION: 1. No pneumothorax status post right chest tube removal. 2. New opacity adjacent to the right upper lobe suture chain. Electronically Signed   By: Kerby Moors M.D.   On: 08/07/2021 09:34   DG Chest 2 View  Result Date: 08/04/2021 CLINICAL DATA:  76 year old male with preoperative chest x-ray EXAM: CHEST - 2 VIEW COMPARISON:  10/08/2017, PET-CT 06/23/2021 FINDINGS: Cardiomediastinal silhouette unchanged in size and  contour. Surgical changes of prior median sternotomy and CABG. No pneumothorax pleural effusion or confluent airspace disease. The previously described ground-glass opacity of the right lung is not well visualized on the plain film. No displaced fracture.  Degenerative changes of the spine IMPRESSION: Negative for acute cardiopulmonary disease Electronically Signed   By: Corrie Mckusick D.O.   On: 08/04/2021 08:31   DG Chest Port 1 View  Result Date: 08/06/2021 CLINICAL DATA:  75 year old male status post partial lobectomy of the lung. EXAM: PORTABLE CHEST 1 VIEW COMPARISON:  Chest x-ray 08/05/2021. FINDINGS: Right-sided chest tube with tip in the upper right hemithorax. Small residual right apical pneumothorax occupying less than 5% of the volume of the right hemithorax. Soft tissue emphysema in the deep soft tissues of the right chest wall. Lung volumes are low. No consolidative airspace disease. No pleural effusions. No evidence of pulmonary edema. Heart size is normal. Upper mediastinal contours are within normal limits. Status post median sternotomy for CABG. IMPRESSION: 1. Postoperative changes and support apparatus, as above. 2. Small right apical pneumothorax occupying less than 5% of the volume of the right hemithorax. Electronically Signed    By: Vinnie Langton M.D.   On: 08/06/2021 09:43   DG Chest Port 1 View  Result Date: 08/05/2021 CLINICAL DATA:  Status post right lung surgery. EXAM: PORTABLE CHEST 1 VIEW COMPARISON:  08/03/2021. FINDINGS: Since the previous exam, right lung surgery has been performed. Pulmonary anastomosis staples extend superiorly from the right hilum. A right-sided chest tube has its tip projecting over the right upper lobe above the right hilum. Linear opacity is noted in the left lung base consistent with atelectasis. Remainder of the lungs is clear. Stable changes from prior CABG surgery. Cardiac silhouette normal in size. No mediastinal widening or masses. No pneumothorax. IMPRESSION: 1. Postop from right lung surgery. No evidence of an operative complication. No acute findings. 2. Left lung base atelectasis. Electronically Signed   By: Lajean Manes M.D.   On: 08/05/2021 13:45    ASSESSMENT: This is a very pleasant 76 years old white male recently diagnosed with a stage IA (T1c, N0, M0) non-small cell lung cancer, adenocarcinoma presented with right upper lobe lung nodule status post right upper lobe posterior segmentectomy with lymph node dissection under the care of Dr. Roxan Hockey on August 05, 2021.   PLAN: I had a lengthy discussion with the patient today about his current disease stage, prognosis and treatment options.  I explained to the patient that he had curative treatment for his condition with the surgical resection.  I also explained to the patient that there is no survival benefit for adjuvant systemic chemotherapy for patient with a stage Ia non-small cell lung cancer and the current standard of care is observation. The patient usually has repeat annual CT angiogram of the chest for evaluation of the ascending thoracic aneurysm.  He is expected to have the next scan and September or early October 2023. I recommended for the patient to come back for follow-up visit after his upcoming scan for  routine evaluation and blood work. He was advised to call immediately if he has any other concerning symptoms in the interval. The patient voices understanding of current disease status and treatment options and is in agreement with the current care plan.  All questions were answered. The patient knows to call the clinic with any problems, questions or concerns. We can certainly see the patient much sooner if necessary.  Thank you so much for allowing  me to participate in the care of Perry [name].. I will continue to follow up the patient with you and assist in his care.  The total time spent in the appointment was 60 minutes.  Disclaimer: This note was dictated with voice recognition software. Similar sounding words can inadvertently be transcribed and may not be corrected upon review.   Eilleen Kempf August 23, 2021, 1:20 PM

## 2021-08-24 ENCOUNTER — Encounter: Payer: Self-pay | Admitting: Interventional Cardiology

## 2021-08-24 ENCOUNTER — Other Ambulatory Visit: Payer: Self-pay

## 2021-08-24 ENCOUNTER — Telehealth: Payer: Self-pay

## 2021-08-24 ENCOUNTER — Ambulatory Visit (INDEPENDENT_AMBULATORY_CARE_PROVIDER_SITE_OTHER): Payer: Medicare Other | Admitting: Interventional Cardiology

## 2021-08-24 VITALS — BP 102/70 | HR 61 | Ht 72.0 in | Wt 224.8 lb

## 2021-08-24 DIAGNOSIS — H25042 Posterior subcapsular polar age-related cataract, left eye: Secondary | ICD-10-CM | POA: Diagnosis not present

## 2021-08-24 DIAGNOSIS — I25810 Atherosclerosis of coronary artery bypass graft(s) without angina pectoris: Secondary | ICD-10-CM

## 2021-08-24 DIAGNOSIS — R0989 Other specified symptoms and signs involving the circulatory and respiratory systems: Secondary | ICD-10-CM | POA: Diagnosis not present

## 2021-08-24 DIAGNOSIS — I712 Thoracic aortic aneurysm, without rupture, unspecified: Secondary | ICD-10-CM

## 2021-08-24 DIAGNOSIS — I480 Paroxysmal atrial fibrillation: Secondary | ICD-10-CM

## 2021-08-24 DIAGNOSIS — E1159 Type 2 diabetes mellitus with other circulatory complications: Secondary | ICD-10-CM | POA: Diagnosis not present

## 2021-08-24 DIAGNOSIS — H2512 Age-related nuclear cataract, left eye: Secondary | ICD-10-CM | POA: Diagnosis not present

## 2021-08-24 DIAGNOSIS — G4733 Obstructive sleep apnea (adult) (pediatric): Secondary | ICD-10-CM

## 2021-08-24 DIAGNOSIS — H2589 Other age-related cataract: Secondary | ICD-10-CM | POA: Diagnosis not present

## 2021-08-24 DIAGNOSIS — I10 Essential (primary) hypertension: Secondary | ICD-10-CM

## 2021-08-24 DIAGNOSIS — R9389 Abnormal findings on diagnostic imaging of other specified body structures: Secondary | ICD-10-CM

## 2021-08-24 DIAGNOSIS — H2513 Age-related nuclear cataract, bilateral: Secondary | ICD-10-CM | POA: Diagnosis not present

## 2021-08-24 DIAGNOSIS — R0609 Other forms of dyspnea: Secondary | ICD-10-CM

## 2021-08-24 DIAGNOSIS — H35371 Puckering of macula, right eye: Secondary | ICD-10-CM | POA: Diagnosis not present

## 2021-08-24 DIAGNOSIS — H25013 Cortical age-related cataract, bilateral: Secondary | ICD-10-CM | POA: Diagnosis not present

## 2021-08-24 NOTE — Patient Instructions (Addendum)
Medication Instructions:  Your physician recommends that you continue on your current medications as directed. Please refer to the Current Medication list given to you today.  *If you need a refill on your cardiac medications before your next appointment, please call your pharmacy*   Lab Work: none If you have labs (blood work) drawn today and your tests are completely normal, you will receive your results only by: [facility] (if you have MyChart) OR A paper copy in the mail If you have any lab test that is abnormal or we need to change your treatment, we will call you to review the results.   Testing/Procedures: Prior to your October 2023 appt with Dr. Tamala Julian  Your physician has requested that you have a carotid duplex. This test is an ultrasound of the carotid arteries in your neck. It looks at blood flow through these arteries that supply the brain with blood. Allow one hour for this exam. There are no restrictions or special instructions.  CT of the Aorta late September 2023     Follow-Up: At [facility], you and your health needs are our priority.  As part of our continuing mission to provide you with exceptional heart care, we have created designated Provider Care Teams.  These Care Teams include your primary Cardiologist (physician) and Advanced Practice Providers (APPs -  Physician Assistants and Nurse Practitioners) who all work together to provide you with the care you need, when you need it.  We recommend signing up for the patient portal called "MyChart".  Sign up information is provided on this After Visit Summary.  MyChart is used to connect with patients for Virtual Visits (Telemedicine).  Patients are able to view lab/test results, encounter notes, upcoming appointments, etc.  Non-urgent messages can be sent to your provider as well.   To learn more about what you can do with MyChart, go to [email or web address].    Your next appointment:   9 month(s)  The  format for your next appointment:   In Person  Provider:   Sinclair Grooms, MD     Other Instructions

## 2021-08-24 NOTE — Telephone Encounter (Signed)
Transition Care Management Follow-up Telephone Call Date of discharge and from where: 08/07/21 from [facility] How have you been since you were released from the hospital? "All good" Any questions or concerns? No  Items Reviewed: Did the pt receive and understand the discharge instructions provided? Yes  Medications obtained and verified? Yes  Other? No  Any new allergies since your discharge? No  Dietary orders reviewed? Yes Do you have support at home? Yes   Home Care and Equipment/Supplies: Were home health services ordered? not applicable If so, what is the name of the agency? Not applicable  Has the agency set up a time to come to the patient's home? not applicable Were any new equipment or medical supplies ordered?  No What is the name of the medical supply agency? Not applicable Were you able to get the supplies/equipment? not applicable Do you have any questions related to the use of the equipment or supplies? No  Functional Questionnaire: (I = Independent and D = Dependent) ADLs: I  Bathing/Dressing- I  Meal Prep- I  Eating- I  Maintaining continence- I  Transferring/Ambulation- I  Managing Meds- I  Follow up appointments reviewed:  PCP Hospital f/u appt confirmed?  Following up with specialist   Specialist Hospital f/u appt confirmed? Saw Dr. Roxan Hockey on 08/18/21; Dr. Julien Nordmann on 08/23/21 and Dr. Linard Millers 08/24/21.  Are transportation arrangements needed? No  If their condition worsens, is the pt aware to call PCP or go to the Emergency Dept.? Yes Was the patient provided with contact information for the PCP's office or ED? Yes Was to pt encouraged to call back with questions or concerns? Yes   Thea Silversmith, RN, MSN, BSN, [facility] Care Management Coordinator [phone]

## 2021-08-30 ENCOUNTER — Telehealth: Payer: Self-pay | Admitting: *Deleted

## 2021-08-30 ENCOUNTER — Other Ambulatory Visit: Payer: Self-pay | Admitting: Thoracic Surgery (Cardiothoracic Vascular Surgery)

## 2021-08-30 MED ORDER — HYDROCOD POLI-CHLORPHE POLI ER 10-8 MG/5ML PO SUER: 5.0000 mL | Freq: Two times a day (BID) | ORAL | 0 refills | Status: DC | PRN

## 2021-08-30 NOTE — Telephone Encounter (Signed)
-----   Message from Melrose Nakayama, MD sent at 08/30/2021  2:41 PM EST ----- Regarding: RE: cough med request Sent a script for tussionex ----- Message ----- From: Ladon Applebaum, RN Sent: 08/30/2021   1:30 PM EST To: Melrose Nakayama, MD Subject: cough med request                              Dr. Roxan Hockey,  Mr. Ordway called requesting something for his cough. He is s/p robotic seg 12/30, he followed up with you 1/12. Patient states the cough is non productive and has gotten worse. He is now requesting prescription medication to help with his cough. Please advise.   [place], [place] Cathie Beams  Phone:  [phone] Fax:  [phone]  ThanksKeith Rake

## 2021-09-05 ENCOUNTER — Other Ambulatory Visit: Payer: Self-pay | Admitting: Family Medicine

## 2021-09-05 DIAGNOSIS — E782 Mixed hyperlipidemia: Secondary | ICD-10-CM

## 2021-09-05 DIAGNOSIS — E119 Type 2 diabetes mellitus without complications: Secondary | ICD-10-CM

## 2021-09-05 DIAGNOSIS — I25700 Atherosclerosis of coronary artery bypass graft(s), unspecified, with unstable angina pectoris: Secondary | ICD-10-CM

## 2021-09-07 ENCOUNTER — Other Ambulatory Visit: Payer: Self-pay | Admitting: Family Medicine

## 2021-09-07 DIAGNOSIS — I25700 Atherosclerosis of coronary artery bypass graft(s), unspecified, with unstable angina pectoris: Secondary | ICD-10-CM

## 2021-09-07 DIAGNOSIS — I10 Essential (primary) hypertension: Secondary | ICD-10-CM

## 2021-09-20 DIAGNOSIS — H25042 Posterior subcapsular polar age-related cataract, left eye: Secondary | ICD-10-CM | POA: Diagnosis not present

## 2021-09-20 DIAGNOSIS — H25812 Combined forms of age-related cataract, left eye: Secondary | ICD-10-CM | POA: Diagnosis not present

## 2021-09-20 DIAGNOSIS — H2512 Age-related nuclear cataract, left eye: Secondary | ICD-10-CM | POA: Diagnosis not present

## 2021-09-28 ENCOUNTER — Other Ambulatory Visit: Payer: Self-pay | Admitting: Family Medicine

## 2021-09-28 DIAGNOSIS — E782 Mixed hyperlipidemia: Secondary | ICD-10-CM

## 2021-09-28 DIAGNOSIS — E119 Type 2 diabetes mellitus without complications: Secondary | ICD-10-CM

## 2021-09-28 DIAGNOSIS — I25700 Atherosclerosis of coronary artery bypass graft(s), unspecified, with unstable angina pectoris: Secondary | ICD-10-CM

## 2021-10-13 DIAGNOSIS — H25011 Cortical age-related cataract, right eye: Secondary | ICD-10-CM | POA: Diagnosis not present

## 2021-10-13 DIAGNOSIS — H2511 Age-related nuclear cataract, right eye: Secondary | ICD-10-CM | POA: Diagnosis not present

## 2021-10-13 LAB — HM DIABETES EYE EXAM

## 2021-10-14 ENCOUNTER — Telehealth: Payer: Self-pay | Admitting: *Deleted

## 2021-10-14 NOTE — Chronic Care Management (AMB) (Signed)
?  Chronic Care Management  ? ?Outreach Note ? ?10/14/2021 ?Name: Perry [name]. MRN: [id number] DOB: 23-Mar-1946 ? ?Perry [name]. is a 76 y.o. year old male who is a primary care patient of Copland, Gay Filler, MD. I reached out to Perry [name]. by phone today in response to a referral sent by Mr. [name] Jr.'s primary care provider. ? ?An unsuccessful telephone outreach was attempted today. The patient was referred to the case management team for assistance with care management and care coordination.  ? ?Follow Up Plan: A HIPAA compliant phone message was left for the patient providing contact information and requesting a return call.  ? ?Kaleo Condrey, CCMA ?Care Guide, Embedded Care Coordination ?[facility]  Care Management  ?Direct Dial: [phone] ? ? ?

## 2021-10-17 ENCOUNTER — Other Ambulatory Visit: Payer: Self-pay | Admitting: Thoracic Surgery (Cardiothoracic Vascular Surgery)

## 2021-10-17 DIAGNOSIS — C3491 Malignant neoplasm of unspecified part of right bronchus or lung: Secondary | ICD-10-CM

## 2021-10-18 ENCOUNTER — Other Ambulatory Visit: Payer: Self-pay

## 2021-10-18 ENCOUNTER — Ambulatory Visit (INDEPENDENT_AMBULATORY_CARE_PROVIDER_SITE_OTHER): Payer: Self-pay | Admitting: Thoracic Surgery (Cardiothoracic Vascular Surgery)

## 2021-10-18 ENCOUNTER — Ambulatory Visit
Admission: RE | Admit: 2021-10-18 | Discharge: 2021-10-18 | Disposition: A | Discharge status: Home / Self Care | Payer: Medicare Other | Source: Ambulatory Visit | Attending: Thoracic Surgery (Cardiothoracic Vascular Surgery) | Admitting: Thoracic Surgery (Cardiothoracic Vascular Surgery)

## 2021-10-18 VITALS — BP 112/70 | HR 62 | Resp 20 | Ht 72.0 in | Wt 223.0 lb

## 2021-10-18 DIAGNOSIS — C3491 Malignant neoplasm of unspecified part of right bronchus or lung: Secondary | ICD-10-CM

## 2021-10-18 DIAGNOSIS — Z09 Encounter for follow-up examination after completed treatment for conditions other than malignant neoplasm: Secondary | ICD-10-CM

## 2021-10-18 DIAGNOSIS — C3490 Malignant neoplasm of unspecified part of unspecified bronchus or lung: Secondary | ICD-10-CM | POA: Diagnosis not present

## 2021-10-18 DIAGNOSIS — Z951 Presence of aortocoronary bypass graft: Secondary | ICD-10-CM | POA: Diagnosis not present

## 2021-10-18 NOTE — Progress Notes (Signed)
? ?   ?[address] ?      [place] [address] ?            [phone]   ? ? ?HPI: Perry [name] returns for scheduled postoperative follow-up visit ? ?Perry [name] is a 76 year old man with a history of tobacco abuse, CAD, MI, remote CABG, ascending aneurysm (4.1 cm), hypertension, hyperlipidemia, sleep apnea, paroxysmal atrial fibrillation, and glucose intolerance.   ? ?He had a groundglass opacity which was first noted on CT in 2019.  Over time that increased in size.  I did a right upper lobe posterior segmentectomy on 08/05/2021.  He had a stage Ia adenocarcinoma.  His postoperative course was uncomplicated and went home on day 2. ? ?I saw him back on 08/18/2021.  He was doing well at that time but had a persistent dry cough.  He continues to be bothered by the cough.  He notices it most when he takes a hot shower and when he talks a lot.  Is nonproductive.  He says it feels like irritation in his throat. ? ?Past Medical History:  ?Diagnosis Date  ? A-fib ([place]) 04/07/2014  ? Abscess of abdominal cavity ([place]) 05/04/2014  ? Acute appendicitis with perforation and peritoneal abscess 05/04/2014  ? Allergy   ? Anginal pain ([place])   ? CAD (coronary artery disease)   ? CAD (coronary artery disease) of artery bypass graft 05/04/2014  ? Cancer [facility]) 07/2021  ? lung  ? CHF (congestive heart failure) ([place])   ? pt not aware  ? Diabetes mellitus   ? not on medication - pt not aware of this  ? Dyslipidemia 05/04/2014  ? Dysrhythmia   ? Hearing loss   ? mild  ? Hernia, inguinal   ? right  ? Hyperglycemia   ? Hypertension   ? Ileus, postoperative ([place]) 05/04/2014  ? Lumbar disc disease   ? Old inferior wall myocardial infarction 08/07/2002  ? ? ?Current Outpatient Medications  ?Medication Sig Dispense Refill  ? aspirin (HM ASPIRIN) 81 MG chewable tablet Chew 1 tablet (81 mg total) by mouth daily. (Patient taking differently: Chew 81 mg by mouth daily. EC) 90 tablet 3  ? BELSOMRA 15 MG TABS TAKE 1 TABLET BY  MOUTH AT BEDTIME AS NEEDED 30 tablet 5  ? chlorpheniramine-HYDROcodone (TUSSIONEX PENNKINETIC ER) 10-8 MG/5ML Take 5 mLs by mouth every 12 (twelve) hours as needed for cough. (Patient not taking: Reported on 10/18/2021) 70 mL 0  ? fenofibrate (TRICOR) 145 MG tablet TAKE 1 TABLET BY MOUTH DAILY - NEEDS LABWORK BEFORE ANY MORE REFILLS 30 tablet 5  ? fish oil-omega-3 fatty acids 1000 MG capsule Take 2 g by mouth daily.    ? INVOKANA 100 MG TABS tablet TAKE 1 TABLET BY MOUTH EVERY DAY BEFORE BREAKFAST 30 tablet 5  ? lisinopril (ZESTRIL) 40 MG tablet TAKE 1 TABLET BY MOUTH DAILY 90 tablet 1  ? metFORMIN (GLUCOPHAGE-XR) 500 MG 24 hr tablet TAKE 2 TABLETS BY MOUTH EVERY MORNING - NEEDS LABWORK BEFORE ANY MORE REFILLS 60 tablet 5  ? metoprolol succinate (TOPROL-XL) 50 MG 24 hr tablet Take 1 tablet (50 mg total) by mouth daily. Take with or immediately following a meal 90 tablet 1  ? Multiple Vitamin (MULTIVITAMIN ADULT PO) Take 1 tablet by mouth daily.    ? nitroGLYCERIN (NITROSTAT) 0.4 MG SL tablet Place 1 tablet (0.4 mg total) under the tongue every 5 (five) minutes as needed for chest pain. 25 tablet 5  ?  rosuvastatin (CRESTOR) 10 MG tablet TAKE 1 TABLET BY MOUTH DAILY 30 tablet 5  ? ?No current facility-administered medications for this visit.  ? ? ?Physical Exam ?BP 112/70   Pulse 62   Resp 20   Ht 6' (1.829 m)   Wt 223 lb (101.2 kg)   SpO2 100% Comment: RA  BMI 30.24 kg/m?  ?Well-appearing 76 year old man in no acute distress ?Alert and oriented x3 with no focal deficits ?Lungs clear with equal breath sounds bilaterally ?Incisions well-healed ?Cardiac regular rate and rhythm ? ?Diagnostic Tests: ?I personally reviewed his chest x-ray.  It shows postoperative changes.  No active disease. ? ?Impression: ?Perry [name] is a 76 year old man with a history of remote tobacco abuse, CAD, MI, remote CABG, ascending aneurysm, hypertension, hyperlipidemia, sleep apnea, paroxysmal atrial fibrillation, and glucose  intolerance.  He recently underwent a right upper lobe posterior segmentectomy for a T1, N0, stage Ia adenocarcinoma. ? ?Adenocarcinoma the lung-T1, N0, stage Ia.  He saw Dr. Julien Nordmann.  He does not require adjuvant therapy.  He will need semiannual follow-up for 2 years followed by annual follow-up after that. ? ?Status post posterior segmentectomy-doing well from a surgical standpoint.  No pain.  His only complaint is a persistent cough.  Should resolve with time. ? ?Ascending aneurysm-4.1 cm aneurysm on CT.  Lung nodule was found on the CT to follow the aneurysm.  That can be followed along with his CTs done for the cancer follow-up. ? ?Plan: ?Return in 5 to 6 months after CT by Dr. Julien Nordmann. ?Call if any issues arise. ? ?Melrose Nakayama, MD ?[facility] ?[phone] ? ? ? ?

## 2021-11-01 DIAGNOSIS — H25811 Combined forms of age-related cataract, right eye: Secondary | ICD-10-CM | POA: Diagnosis not present

## 2021-11-01 DIAGNOSIS — H2511 Age-related nuclear cataract, right eye: Secondary | ICD-10-CM | POA: Diagnosis not present

## 2021-11-01 NOTE — Chronic Care Management (AMB) (Signed)
Ongoing outreach regarding available care management services will occur over the next 30 days ?

## 2021-11-17 DIAGNOSIS — Z20828 Contact with and (suspected) exposure to other viral communicable diseases: Secondary | ICD-10-CM | POA: Diagnosis not present

## 2021-12-02 ENCOUNTER — Telehealth: Payer: Self-pay | Admitting: *Deleted

## 2021-12-02 NOTE — Chronic Care Management (AMB) (Signed)
?  Care Management  ? ?Note ? ?12/02/2021 ?Name: Perry [name]. MRN: [id number] DOB: 05-20-1946 ? ?Perry [name]. is a 76 y.o. year old male who is a primary care patient of Copland, Gay Filler, MD. I reached out to Georgianne Fick. by phone today offer care coordination services.  ? ?[name] was given information about care management services today including:  ?Care management services include personalized support from designated clinical staff supervised by his physician, including individualized plan of care and coordination with other care providers ?24/7 contact phone numbers for assistance for urgent and routine care needs. ?The patient may stop care management services at any time by phone call to the office staff. ? ?Patient agreed to services and verbal consent obtained.  ? ?Follow up plan: ?Telephone appointment with care management team member scheduled for: 12/05/2021 ? ?Kijana Cromie, CCMA ?Care Guide, Embedded Care Coordination ?[facility]  ?Direct Dial: [phone] ? ? ?

## 2021-12-05 ENCOUNTER — Ambulatory Visit: Payer: Medicare Other

## 2021-12-05 NOTE — Patient Instructions (Signed)
Visit Information  Thank you for allowing me to share the care management and care coordination services that are available to you as part of your health plan and services through your primary care provider and medical home. Please reach out to me at [phone] if the care management/care coordination team may be of assistance to you in the future.   Naidelin Gugliotta, RN, MSN, BSN, CCM Care Management Coordinator [facility] [phone]  

## 2021-12-05 NOTE — Chronic Care Management (AMB) (Signed)
?  Care Management  ? ?Outreach Note ? ?12/05/2021 ?Name: Perry [name]. MRN: [id number] DOB: 03-Apr-1946 ? ?Referred by: Darreld Mclean, MD ?Reason for referral : No chief complaint on file. ? ? ?Successful contact was made with the patient to discuss care management and care coordination services. Patient declines engagement at this time.  ? ?Follow Up Plan:  No Follow up Required. ? ?Thea Silversmith, RN, MSN, BSN, CCM ?Care Management Coordinator ?[facility] ?[phone]  ?

## 2021-12-28 ENCOUNTER — Other Ambulatory Visit: Payer: Self-pay | Admitting: Family Medicine

## 2021-12-28 ENCOUNTER — Encounter: Payer: Self-pay | Admitting: Family Medicine

## 2022-01-27 ENCOUNTER — Encounter: Payer: Self-pay | Admitting: *Deleted

## 2022-02-26 ENCOUNTER — Other Ambulatory Visit: Payer: Self-pay | Admitting: Family Medicine

## 2022-02-26 DIAGNOSIS — E782 Mixed hyperlipidemia: Secondary | ICD-10-CM

## 2022-02-26 DIAGNOSIS — I25700 Atherosclerosis of coronary artery bypass graft(s), unspecified, with unstable angina pectoris: Secondary | ICD-10-CM

## 2022-03-05 NOTE — Progress Notes (Unsigned)
[facility] [address] [place], [place] [address] [phone]  Date:  03/08/2022   Name:  Perry [name].   DOB:  Feb 17, 1946   MRN:  [id number]  PCP:  Darreld Mclean, MD    Chief Complaint: No chief complaint on file.   History of Present Illness:  Perry [name]. is a 76 y.o. 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 Julien Nordmann in January: ASSESSMENT: This is a very pleasant 76 years old white male recently diagnosed with a stage IA (T1c, N0, M0) non-small cell lung cancer, adenocarcinoma presented with right upper lobe lung nodule status post right upper lobe posterior segmentectomy with lymph node dissection under the care of Dr. Roxan Hockey on August 05, 2021. PLAN: I had a lengthy discussion with the patient today about his current disease stage, prognosis and treatment options.  I explained to the patient that he had curative treatment for his condition with the surgical resection.  I also explained to the patient that there is no survival benefit for adjuvant systemic chemotherapy for patient with a stage Ia non-small cell lung cancer and the current standard of care is observation. The patient usually has repeat annual CT angiogram of the chest for evaluation of the ascending thoracic aneurysm.  He is expected to have the next scan and September or early  October 2023. I recommended for the patient to come back for follow-up visit after his upcoming scan for routine evaluation and blood work.   Lab Results  Component Value Date   HGBA1C 7.7 (H) 08/03/2021   Foot exam is due Last PSA elevated- he does have urology care with Dr Diona Fanti- ? Last visit.  This may have gotten lost during lung cancer treatment   Patient Active Problem List   Diagnosis Date Noted   Adenocarcinoma of right lung, stage 1 ([place]) 08/23/2021   Lung nodule 08/05/2021   S/P Robotic Assisted Right VATS with Posterior Segmentectomy of RLL 08/05/2021   Pulmonary nodule 06/09/2021   History of epistaxis 12/01/2019   Intolerance of continuous positive airway pressure (CPAP) ventilation 12/01/2019   Hypersomnia with sleep apnea 12/01/2019   Other insomnia 12/01/2019   Thoracic aortic aneurysm ([place]) 05/29/2018   Bradycardia 04/16/2017   Acute appendicitis with perforation and peritoneal abscess 05/04/2014   Abscess of abdominal cavity ([place]) 05/04/2014   Atrial fibrillation ([place]) 04/28/2014   Hyperlipidemia    Sleep apnea    CAD (coronary artery disease)    Hearing loss    Diabetes mellitus ([place])     Past Medical History:  Diagnosis Date   A-fib ([place]) 04/07/2014   Abscess of abdominal cavity ([place]) 05/04/2014   Acute appendicitis with perforation and peritoneal abscess 05/04/2014   Allergy    Anginal pain ([facility])    CAD (coronary artery disease)    CAD (coronary artery disease) of artery bypass graft 05/04/2014   Cancer (  [place]) 07/2021   lung   CHF (congestive heart failure) ([place])    pt not aware   Diabetes mellitus    not on medication - pt not aware of this   Dyslipidemia 05/04/2014   Dysrhythmia    Hearing loss    mild   Hernia, inguinal    right   Hyperglycemia    Hypertension    Ileus, postoperative ([place]) 05/04/2014   Lumbar disc disease    Old inferior wall myocardial infarction 08/07/2002    Past Surgical History:  Procedure Laterality Date    COLONOSCOPY     CORONARY ANGIOPLASTY     multiple   CORONARY ARTERY BYPASS GRAFT  2004   LIMA to LAD and SVG to RCA (emergent)   INTERCOSTAL NERVE BLOCK Right 08/05/2021   Procedure: INTERCOSTAL NERVE BLOCK;  Surgeon: Melrose Nakayama, MD;  Location: [facility];  Service: Thoracic;  Laterality: Right;   LAPAROSCOPIC APPENDECTOMY N/A 04/21/2014   Procedure: APPENDECTOMY LAPAROSCOPIC;  Surgeon: Rolm Bookbinder, MD;  Location: [facility];  Service: General;  Laterality: N/A;   LAPAROSCOPIC APPENDECTOMY N/A 06/24/2014   Procedure: APPENDECTOMY LAPAROSCOPIC ;  Surgeon: Rolm Bookbinder, MD;  Location: [facility];  Service: General;  Laterality: N/A;   LUMBAR LAMINECTOMY  2001   lumbar   LYMPH NODE DISSECTION Right 08/05/2021   Procedure: LYMPH NODE DISSECTION;  Surgeon: Melrose Nakayama, MD;  Location: [facility];  Service: Thoracic;  Laterality: Right;   TONSILLECTOMY     XI ROBOTIC ASSISTED THORACOSCOPY- SEGMENTECTOMY Right 08/05/2021   Procedure: XI ROBOTIC ASSISTED THORACOSCOPY- RIGHT UPPER LOBE POSTERIOR SEGMENTECTOMY, possible lobectomy;  Surgeon: Melrose Nakayama, MD;  Location: [facility];  Service: Thoracic;  Laterality: Right;    Social History   Tobacco Use   Smoking status: Former    Types: Cigarettes    Quit date: 06/24/1979    Years since quitting: 42.7   Smokeless tobacco: Former    Types: Chew  Vaping Use   Vaping Use: Never used  Substance Use Topics   Alcohol use: Yes    Alcohol/week: 14.0 standard drinks of alcohol    Types: 14 Cans of beer per week    Comment: 2 drinks a day   Drug use: No    Family History  Problem Relation Age of Onset   Heart disease Father    Heart disease Brother    Hypertension Brother    Cancer Sister    Heart disease Paternal Grandmother     No Known Allergies  Medication list has been reviewed and updated.  Current Outpatient Medications on File Prior to Visit  Medication Sig Dispense Refill   chlorpheniramine-HYDROcodone  (TUSSIONEX PENNKINETIC ER) 10-8 MG/5ML Take 5 mLs by mouth every 12 (twelve) hours as needed for cough. (Patient not taking: Reported on 10/18/2021) 70 mL 0   aspirin (HM ASPIRIN) 81 MG chewable tablet Chew 1 tablet (81 mg total) by mouth daily. (Patient taking differently: Chew 81 mg by mouth daily. EC) 90 tablet 3   BELSOMRA 15 MG TABS TAKE 1 TABLET BY MOUTH AT BEDTIME AS NEEDED 30 tablet 5   fenofibrate (TRICOR) 145 MG tablet TAKE 1 TABLET BY MOUTH DAILY - NEEDS LABWORK BEFORE ANY MORE REFILLS 30 tablet 5   fish oil-omega-3 fatty acids 1000 MG capsule Take 2 g by mouth daily.     INVOKANA 100 MG TABS tablet TAKE 1 TABLET BY MOUTH EVERY DAY BEFORE BREAKFAST 30 tablet 5   lisinopril (ZESTRIL) 40 MG tablet TAKE  1 TABLET BY MOUTH DAILY 90 tablet 1   metFORMIN (GLUCOPHAGE-XR) 500 MG 24 hr tablet TAKE 2 TABLETS BY MOUTH EVERY MORNING - NEEDS LABWORK BEFORE ANY MORE REFILLS 60 tablet 5   metoprolol succinate (TOPROL-XL) 50 MG 24 hr tablet Take 1 tablet (50 mg total) by mouth daily. Take with or immediately following a meal 90 tablet 1   Multiple Vitamin (MULTIVITAMIN ADULT PO) Take 1 tablet by mouth daily.     nitroGLYCERIN (NITROSTAT) 0.4 MG SL tablet Place 1 tablet (0.4 mg total) under the tongue every 5 (five) minutes as needed for chest pain. 25 tablet 5   rosuvastatin (CRESTOR) 10 MG tablet TAKE 1 TABLET BY MOUTH DAILY 30 tablet 0   No current facility-administered medications on file prior to visit.    Review of Systems:  As per HPI- otherwise negative.   Physical Examination: There were no vitals filed for this visit. There were no vitals filed for this visit. There is no height or weight on file to calculate BMI. Ideal Body Weight:    GEN: no acute distress. HEENT: Atraumatic, Normocephalic.  Ears and Nose: No external deformity. CV: RRR, No M/G/R. No JVD. No thrill. No extra heart sounds. PULM: CTA B, no wheezes, crackles, rhonchi. No retractions. No resp. distress. No accessory  muscle use. ABD: S, NT, ND, +BS. No rebound. No HSM. EXTR: No c/c/e PSYCH: Normally interactive. Conversant.    Assessment and Plan: ***  Signed Lamar Blinks, MD

## 2022-03-08 ENCOUNTER — Ambulatory Visit (INDEPENDENT_AMBULATORY_CARE_PROVIDER_SITE_OTHER): Payer: Medicare Other | Admitting: Family Medicine

## 2022-03-08 ENCOUNTER — Ambulatory Visit (HOSPITAL_BASED_OUTPATIENT_CLINIC_OR_DEPARTMENT_OTHER)
Admission: RE | Admit: 2022-03-08 | Discharge: 2022-03-08 | Disposition: A | Discharge status: Home / Self Care | Payer: Medicare Other | Source: Ambulatory Visit | Attending: Family Medicine | Admitting: Family Medicine

## 2022-03-08 VITALS — BP 130/60 | HR 55 | Temp 97.7°F | Resp 18 | Ht 71.0 in | Wt 234.0 lb

## 2022-03-08 DIAGNOSIS — I25700 Atherosclerosis of coronary artery bypass graft(s), unspecified, with unstable angina pectoris: Secondary | ICD-10-CM

## 2022-03-08 DIAGNOSIS — E782 Mixed hyperlipidemia: Secondary | ICD-10-CM

## 2022-03-08 DIAGNOSIS — M7702 Medial epicondylitis, left elbow: Secondary | ICD-10-CM

## 2022-03-08 DIAGNOSIS — M4186 Other forms of scoliosis, lumbar region: Secondary | ICD-10-CM | POA: Diagnosis not present

## 2022-03-08 DIAGNOSIS — G8929 Other chronic pain: Secondary | ICD-10-CM | POA: Diagnosis not present

## 2022-03-08 DIAGNOSIS — M2550 Pain in unspecified joint: Secondary | ICD-10-CM | POA: Diagnosis not present

## 2022-03-08 DIAGNOSIS — E119 Type 2 diabetes mellitus without complications: Secondary | ICD-10-CM | POA: Diagnosis not present

## 2022-03-08 DIAGNOSIS — I10 Essential (primary) hypertension: Secondary | ICD-10-CM

## 2022-03-08 DIAGNOSIS — R972 Elevated prostate specific antigen [PSA]: Secondary | ICD-10-CM

## 2022-03-08 DIAGNOSIS — M545 Low back pain, unspecified: Secondary | ICD-10-CM

## 2022-03-08 DIAGNOSIS — M47816 Spondylosis without myelopathy or radiculopathy, lumbar region: Secondary | ICD-10-CM | POA: Diagnosis not present

## 2022-03-08 DIAGNOSIS — M2578 Osteophyte, vertebrae: Secondary | ICD-10-CM | POA: Diagnosis not present

## 2022-03-08 DIAGNOSIS — Z85118 Personal history of other malignant neoplasm of bronchus and lung: Secondary | ICD-10-CM | POA: Diagnosis not present

## 2022-03-08 NOTE — Patient Instructions (Signed)
Good to see you today!   I will be in touch with your labs Let me know if you would like me to have you see Dr Wynelle Link about your hips/ knees   X-rays of your back today Try a tennis elbow strap for your left elbow pain/ tendonitis   Please schedule follow-up with  -dermatology -urology

## 2022-03-09 ENCOUNTER — Encounter: Payer: Self-pay | Admitting: Family Medicine

## 2022-03-09 DIAGNOSIS — G8929 Other chronic pain: Secondary | ICD-10-CM

## 2022-03-09 LAB — LIPID PANEL
Cholesterol: 107 mg/dL (ref 0–200)
HDL: 31.1 mg/dL — ABNORMAL LOW (ref >39.00)
NonHDL: 76.11
Total CHOL/HDL Ratio: 3
Triglycerides: 287 mg/dL — ABNORMAL HIGH (ref 0.0–149.0)
VLDL: 57.4 mg/dL — ABNORMAL HIGH (ref 0.0–40.0)

## 2022-03-09 LAB — CBC
HCT: 42.3 % (ref 39.0–52.0)
Hemoglobin: 14.2 g/dL (ref 13.0–17.0)
MCHC: 33.6 g/dL (ref 30.0–36.0)
MCV: 95.2 fl (ref 78.0–100.0)
Platelets: 200 10*3/uL (ref 150.0–400.0)
RBC: 4.44 Mil/uL (ref 4.22–5.81)
RDW: 13.1 % (ref 11.5–15.5)
WBC: 4.8 10*3/uL (ref 4.0–10.5)

## 2022-03-09 LAB — COMPREHENSIVE METABOLIC PANEL
ALT: 29 U/L (ref 0–53)
AST: 25 U/L (ref 0–37)
Albumin: 4.3 g/dL (ref 3.5–5.2)
Alkaline Phosphatase: 65 U/L (ref 39–117)
BUN: 29 mg/dL — ABNORMAL HIGH (ref 6–23)
CO2: 24 mEq/L (ref 19–32)
Calcium: 9.8 mg/dL (ref 8.4–10.5)
Chloride: 104 mEq/L (ref 96–112)
Creatinine, Ser: 1.11 mg/dL (ref 0.40–1.50)
GFR: 64.52 mL/min (ref >60.00)
Glucose, Bld: 227 mg/dL — ABNORMAL HIGH (ref 70–99)
Potassium: 3.9 mEq/L (ref 3.5–5.1)
Sodium: 139 mEq/L (ref 135–145)
Total Bilirubin: 0.5 mg/dL (ref 0.2–1.2)
Total Protein: 6.7 g/dL (ref 6.0–8.3)

## 2022-03-09 LAB — LDL CHOLESTEROL, DIRECT: Direct LDL: 54 mg/dL

## 2022-03-09 LAB — PSA: PSA: 3.24 ng/mL (ref 0.10–4.00)

## 2022-03-09 LAB — C-REACTIVE PROTEIN: CRP: <1 mg/dL (ref 0.5–20.0)

## 2022-03-09 LAB — SEDIMENTATION RATE: Sed Rate: 1 mm/hr (ref 0–20)

## 2022-03-09 LAB — HEMOGLOBIN A1C: Hgb A1c MFr Bld: 7.6 % — ABNORMAL HIGH (ref 4.6–6.5)

## 2022-03-15 ENCOUNTER — Telehealth: Payer: Self-pay | Admitting: Interventional Cardiology

## 2022-03-15 DIAGNOSIS — I712 Thoracic aortic aneurysm, without rupture, unspecified: Secondary | ICD-10-CM

## 2022-03-15 NOTE — Telephone Encounter (Signed)
Patient calling to have a CT done, but there is not a CT order in yet. He says he has an appointment with Dr. Inda Merlin 10/4 and Dr. Tamala Julian 10/16. He says he thinks Dr. Inda Merlin, Dr. Tamala Julian, and Dr. Wallene Huh all wanted him to have a CT so he was told to have it done in late September so that he only had have one done. He says he thinks it is for his lungs and something else.

## 2022-03-15 NOTE — Telephone Encounter (Signed)
From OV note on 08/24/21: Ascending aneurysm measuring 4.1 cm.  Repeat CTA in late September 2023.  CT Angio chest aorta ordered, scheduling to call patient to schedule appt.

## 2022-03-17 NOTE — Addendum Note (Signed)
Addended by: Lamar Blinks C on: 03/17/2022 01:11 PM   Modules accepted: Orders

## 2022-03-26 ENCOUNTER — Other Ambulatory Visit: Payer: Self-pay | Admitting: Family Medicine

## 2022-03-26 DIAGNOSIS — I25700 Atherosclerosis of coronary artery bypass graft(s), unspecified, with unstable angina pectoris: Secondary | ICD-10-CM

## 2022-03-26 DIAGNOSIS — E782 Mixed hyperlipidemia: Secondary | ICD-10-CM

## 2022-03-26 DIAGNOSIS — E119 Type 2 diabetes mellitus without complications: Secondary | ICD-10-CM

## 2022-03-27 ENCOUNTER — Ambulatory Visit (HOSPITAL_COMMUNITY)
Admission: RE | Admit: 2022-03-27 | Discharge: 2022-03-27 | Disposition: A | Discharge status: Home / Self Care | Payer: Medicare Other | Source: Ambulatory Visit | Attending: Family Medicine | Admitting: Family Medicine

## 2022-03-27 DIAGNOSIS — M545 Low back pain, unspecified: Secondary | ICD-10-CM | POA: Insufficient documentation

## 2022-03-27 DIAGNOSIS — G8929 Other chronic pain: Secondary | ICD-10-CM | POA: Diagnosis not present

## 2022-03-28 ENCOUNTER — Encounter: Payer: Self-pay | Admitting: Family Medicine

## 2022-03-29 ENCOUNTER — Other Ambulatory Visit (HOSPITAL_COMMUNITY): Payer: Medicare Other

## 2022-03-29 DIAGNOSIS — S39012A Strain of muscle, fascia and tendon of lower back, initial encounter: Secondary | ICD-10-CM | POA: Diagnosis not present

## 2022-03-29 DIAGNOSIS — Z6832 Body mass index (BMI) 32.0-32.9, adult: Secondary | ICD-10-CM | POA: Diagnosis not present

## 2022-03-29 DIAGNOSIS — M5136 Other intervertebral disc degeneration, lumbar region: Secondary | ICD-10-CM | POA: Diagnosis not present

## 2022-04-06 DIAGNOSIS — H40013 Open angle with borderline findings, low risk, bilateral: Secondary | ICD-10-CM | POA: Diagnosis not present

## 2022-04-06 LAB — HM DIABETES EYE EXAM

## 2022-04-12 NOTE — Telephone Encounter (Signed)
LM for pt to call back.

## 2022-04-14 ENCOUNTER — Ambulatory Visit (HOSPITAL_COMMUNITY)
Admission: RE | Admit: 2022-04-14 | Discharge: 2022-04-14 | Disposition: A | Discharge status: Home / Self Care | Payer: Medicare Other | Source: Ambulatory Visit | Attending: Interventional Cardiology | Admitting: Interventional Cardiology

## 2022-04-14 DIAGNOSIS — I712 Thoracic aortic aneurysm, without rupture, unspecified: Secondary | ICD-10-CM | POA: Diagnosis not present

## 2022-04-14 DIAGNOSIS — R0602 Shortness of breath: Secondary | ICD-10-CM | POA: Diagnosis not present

## 2022-04-14 MED ORDER — SODIUM CHLORIDE (PF) 0.9 % IJ SOLN
INTRAMUSCULAR | Status: AC
  Filled 2022-04-14: qty 50

## 2022-04-14 MED ORDER — IOHEXOL 350 MG/ML SOLN
100.0000 mL | Freq: Once | INTRAVENOUS | Status: AC | PRN
  Administered 2022-04-14: 100 mL via INTRAVENOUS

## 2022-04-24 ENCOUNTER — Other Ambulatory Visit: Payer: Self-pay | Admitting: Family Medicine

## 2022-04-24 DIAGNOSIS — I25700 Atherosclerosis of coronary artery bypass graft(s), unspecified, with unstable angina pectoris: Secondary | ICD-10-CM

## 2022-04-24 DIAGNOSIS — E782 Mixed hyperlipidemia: Secondary | ICD-10-CM

## 2022-04-24 DIAGNOSIS — E119 Type 2 diabetes mellitus without complications: Secondary | ICD-10-CM

## 2022-05-08 ENCOUNTER — Ambulatory Visit (HOSPITAL_COMMUNITY)
Admission: RE | Admit: 2022-05-08 | Discharge: 2022-05-08 | Disposition: A | Discharge status: Home / Self Care | Payer: Medicare Other | Source: Ambulatory Visit | Attending: Interventional Cardiology | Admitting: Interventional Cardiology

## 2022-05-08 DIAGNOSIS — R0989 Other specified symptoms and signs involving the circulatory and respiratory systems: Secondary | ICD-10-CM | POA: Diagnosis not present

## 2022-05-10 ENCOUNTER — Inpatient Hospital Stay: Payer: Medicare Other | Attending: Internal Medicine

## 2022-05-10 ENCOUNTER — Inpatient Hospital Stay (HOSPITAL_BASED_OUTPATIENT_CLINIC_OR_DEPARTMENT_OTHER): Payer: Medicare Other | Admitting: Internal Medicine

## 2022-05-10 ENCOUNTER — Other Ambulatory Visit: Payer: Self-pay

## 2022-05-10 VITALS — BP 132/67 | HR 57 | Temp 98.0°F | Resp 16 | Ht 71.0 in | Wt 234.7 lb

## 2022-05-10 DIAGNOSIS — Z7984 Long term (current) use of oral hypoglycemic drugs: Secondary | ICD-10-CM | POA: Diagnosis not present

## 2022-05-10 DIAGNOSIS — I110 Hypertensive heart disease with heart failure: Secondary | ICD-10-CM | POA: Insufficient documentation

## 2022-05-10 DIAGNOSIS — Z7982 Long term (current) use of aspirin: Secondary | ICD-10-CM | POA: Insufficient documentation

## 2022-05-10 DIAGNOSIS — I252 Old myocardial infarction: Secondary | ICD-10-CM | POA: Insufficient documentation

## 2022-05-10 DIAGNOSIS — E119 Type 2 diabetes mellitus without complications: Secondary | ICD-10-CM | POA: Insufficient documentation

## 2022-05-10 DIAGNOSIS — I4891 Unspecified atrial fibrillation: Secondary | ICD-10-CM | POA: Diagnosis not present

## 2022-05-10 DIAGNOSIS — I509 Heart failure, unspecified: Secondary | ICD-10-CM | POA: Insufficient documentation

## 2022-05-10 DIAGNOSIS — Z79899 Other long term (current) drug therapy: Secondary | ICD-10-CM | POA: Diagnosis not present

## 2022-05-10 DIAGNOSIS — C3491 Malignant neoplasm of unspecified part of right bronchus or lung: Secondary | ICD-10-CM

## 2022-05-10 DIAGNOSIS — C3411 Malignant neoplasm of upper lobe, right bronchus or lung: Secondary | ICD-10-CM | POA: Insufficient documentation

## 2022-05-10 DIAGNOSIS — E785 Hyperlipidemia, unspecified: Secondary | ICD-10-CM | POA: Insufficient documentation

## 2022-05-10 DIAGNOSIS — C3490 Malignant neoplasm of unspecified part of unspecified bronchus or lung: Secondary | ICD-10-CM

## 2022-05-10 LAB — CBC WITH DIFFERENTIAL (CANCER CENTER ONLY)
Abs Immature Granulocytes: 0.02 10*3/uL (ref 0.00–0.07)
Basophils Absolute: 0 10*3/uL (ref 0.0–0.1)
Basophils Relative: 0 %
Eosinophils Absolute: 0.2 10*3/uL (ref 0.0–0.5)
Eosinophils Relative: 3 %
HCT: 38.9 % — ABNORMAL LOW (ref 39.0–52.0)
Hemoglobin: 13.7 g/dL (ref 13.0–17.0)
Immature Granulocytes: 0 %
Lymphocytes Relative: 27 %
Lymphs Abs: 1.5 10*3/uL (ref 0.7–4.0)
MCH: 32.5 pg (ref 26.0–34.0)
MCHC: 35.2 g/dL (ref 30.0–36.0)
MCV: 92.2 fL (ref 80.0–100.0)
Monocytes Absolute: 0.5 10*3/uL (ref 0.1–1.0)
Monocytes Relative: 10 %
Neutro Abs: 3.4 10*3/uL (ref 1.7–7.7)
Neutrophils Relative %: 60 %
Platelet Count: 181 10*3/uL (ref 150–400)
RBC: 4.22 MIL/uL (ref 4.22–5.81)
RDW: 12.9 % (ref 11.5–15.5)
WBC Count: 5.7 10*3/uL (ref 4.0–10.5)
nRBC: 0 % (ref 0.0–0.2)

## 2022-05-10 LAB — CMP (CANCER CENTER ONLY)
ALT: 26 U/L (ref 0–44)
AST: 25 U/L (ref 15–41)
Albumin: 3.9 g/dL (ref 3.5–5.0)
Alkaline Phosphatase: 56 U/L (ref 38–126)
Anion gap: 7 (ref 5–15)
BUN: 24 mg/dL — ABNORMAL HIGH (ref 8–23)
CO2: 26 mmol/L (ref 22–32)
Calcium: 9.6 mg/dL (ref 8.9–10.3)
Chloride: 106 mmol/L (ref 98–111)
Creatinine: 1.02 mg/dL (ref 0.61–1.24)
GFR, Estimated: >60 mL/min (ref >60)
Glucose, Bld: 161 mg/dL — ABNORMAL HIGH (ref 70–99)
Potassium: 3.9 mmol/L (ref 3.5–5.1)
Sodium: 139 mmol/L (ref 135–145)
Total Bilirubin: 0.6 mg/dL (ref 0.3–1.2)
Total Protein: 6.6 g/dL (ref 6.5–8.1)

## 2022-05-10 NOTE — Progress Notes (Signed)
[facility] [phone]) [phone]   [phone]  OFFICE PROGRESS NOTE  Copland, Gay Filler, MD [address] [place] [address]  DIAGNOSIS: Stage IA (T1c, N0, M0) non-small cell lung cancer, adenocarcinoma presented with right upper lobe lung nodule diagnosed in December 2022.   PRIOR THERAPY: status post right upper lobe posterior segmentectomy with lymph node dissection under the care of Dr. Roxan Hockey on August 05, 2021.  CURRENT THERAPY: Observation  INTERVAL HISTORY: Perry [name]. 76 y.o. male returns to the clinic today for returns to the clinic today for 6 months follow-up visit.  The patient is feeling fine today with no concerning complaints except for mild cough.  He denied having any chest pain, shortness of breath or hemoptysis.  He has no nausea, vomiting, diarrhea or constipation.  He has no headache or visual changes.  He denied having any significant fever or chills.  He is here today for evaluation with repeat CT scan of the chest for restaging of his disease.  MEDICAL HISTORY: Past Medical History:  Diagnosis Date   A-fib ([facility]) 04/07/2014   Abscess of abdominal cavity ([facility]) 05/04/2014   Acute appendicitis with perforation and peritoneal abscess 05/04/2014   Allergy    Anginal pain ([facility])    CAD (coronary artery disease)    CAD (coronary artery disease) of artery bypass graft 05/04/2014   Cancer ([facility]) 07/2021   lung   CHF (congestive heart failure) ([facility])    pt not aware   Diabetes mellitus    not on medication - pt not aware of this   Dyslipidemia 05/04/2014   Dysrhythmia    Hearing loss    mild   Hernia, inguinal    right   Hyperglycemia    Hypertension    Ileus, postoperative ([facility]) 05/04/2014   Lumbar disc disease    Old inferior wall myocardial infarction 08/07/2002    ALLERGIES:  has No Known Allergies.  MEDICATIONS:  Current Outpatient Medications  Medication Sig Dispense Refill   aspirin  (HM ASPIRIN) 81 MG chewable tablet Chew 1 tablet (81 mg total) by mouth daily. (Patient taking differently: Chew 81 mg by mouth daily. EC) 90 tablet 3   BELSOMRA 15 MG TABS TAKE 1 TABLET BY MOUTH AT BEDTIME AS NEEDED 30 tablet 5   canagliflozin (INVOKANA) 100 MG TABS tablet Take 1 tablet (100 mg total) by mouth daily before breakfast. 90 tablet 1   chlorpheniramine-HYDROcodone (TUSSIONEX PENNKINETIC ER) 10-8 MG/5ML Take 5 mLs by mouth every 12 (twelve) hours as needed for cough. 70 mL 0   fenofibrate (TRICOR) 145 MG tablet TAKE 1 TABLET BY MOUTH DAILY 30 tablet 5   fish oil-omega-3 fatty acids 1000 MG capsule Take 2 g by mouth daily.     lisinopril (ZESTRIL) 40 MG tablet TAKE 1 TABLET BY MOUTH DAILY 90 tablet 1   metFORMIN (GLUCOPHAGE-XR) 500 MG 24 hr tablet TAKE 2 TABLETS BY MOUTH EVERY MORNING 60 tablet 5   metoprolol succinate (TOPROL-XL) 50 MG 24 hr tablet Take 1 tablet (50 mg total) by mouth daily. 90 tablet 1   Multiple Vitamin (MULTIVITAMIN ADULT PO) Take 1 tablet by mouth daily.     nitroGLYCERIN (NITROSTAT) 0.4 MG SL tablet Place 1 tablet (0.4 mg total) under the tongue every 5 (five) minutes as needed for chest pain. 25 tablet 5   rosuvastatin (CRESTOR) 10 MG tablet Take 1 tablet (10 mg total) by mouth daily. 90 tablet  1   No current facility-administered medications for this visit.    SURGICAL HISTORY:  Past Surgical History:  Procedure Laterality Date   COLONOSCOPY     CORONARY ANGIOPLASTY     multiple   CORONARY ARTERY BYPASS GRAFT  2004   LIMA to LAD and SVG to RCA (emergent)   INTERCOSTAL NERVE BLOCK Right 08/05/2021   Procedure: INTERCOSTAL NERVE BLOCK;  Surgeon: Melrose Nakayama, MD;  Location: [facility];  Service: Thoracic;  Laterality: Right;   LAPAROSCOPIC APPENDECTOMY N/A 04/21/2014   Procedure: APPENDECTOMY LAPAROSCOPIC;  Surgeon: Rolm Bookbinder, MD;  Location: [facility];  Service: General;  Laterality: N/A;   LAPAROSCOPIC APPENDECTOMY N/A 06/24/2014   Procedure:  APPENDECTOMY LAPAROSCOPIC ;  Surgeon: Rolm Bookbinder, MD;  Location: [facility];  Service: General;  Laterality: N/A;   LUMBAR LAMINECTOMY  2001   lumbar   LYMPH NODE DISSECTION Right 08/05/2021   Procedure: LYMPH NODE DISSECTION;  Surgeon: Melrose Nakayama, MD;  Location: [facility];  Service: Thoracic;  Laterality: Right;   TONSILLECTOMY     XI ROBOTIC ASSISTED THORACOSCOPY- SEGMENTECTOMY Right 08/05/2021   Procedure: XI ROBOTIC ASSISTED THORACOSCOPY- RIGHT UPPER LOBE POSTERIOR SEGMENTECTOMY, possible lobectomy;  Surgeon: Melrose Nakayama, MD;  Location: [facility];  Service: Thoracic;  Laterality: Right;    REVIEW OF SYSTEMS:  A comprehensive review of systems was negative except for: Respiratory: positive for cough   PHYSICAL EXAMINATION: General appearance: alert, cooperative, fatigued, and no distress Head: Normocephalic, without obvious abnormality, atraumatic Neck: no adenopathy, no JVD, supple, symmetrical, trachea midline, and thyroid not enlarged, symmetric, no tenderness/mass/nodules Lymph nodes: Cervical, supraclavicular, and axillary nodes normal. Resp: clear to auscultation bilaterally Back: symmetric, no curvature. ROM normal. No CVA tenderness. Cardio: regular rate and rhythm, S1, S2 normal, no murmur, click, rub or gallop GI: soft, non-tender; bowel sounds normal; no masses,  no organomegaly Extremities: extremities normal, atraumatic, no cyanosis or edema  ECOG PERFORMANCE STATUS: 1 - Symptomatic but completely ambulatory  Blood pressure 132/67, pulse (!) 57, temperature 98 F (36.7 C), temperature source Oral, resp. rate 16, height 5\' 11"  (1.803 m), weight 234 lb 11.2 oz (106.5 kg), SpO2 97 %.  LABORATORY DATA: Lab Results  Component Value Date   WBC 5.7 05/10/2022   HGB 13.7 05/10/2022   HCT 38.9 (L) 05/10/2022   MCV 92.2 05/10/2022   PLT 181 05/10/2022      Chemistry      Component Value Date/Time   NA 139 03/08/2022 1514   NA 139 08/20/2020 0950   K 3.9  03/08/2022 1514   CL 104 03/08/2022 1514   CO2 24 03/08/2022 1514   BUN 29 (H) 03/08/2022 1514   BUN 36 (H) 08/20/2020 0950   CREATININE 1.11 03/08/2022 1514   CREATININE 1.00 08/23/2021 1312   CREATININE 1.23 (H) 04/21/2020 0843   GLU 133 05/07/2017 0000      Component Value Date/Time   CALCIUM 9.8 03/08/2022 1514   ALKPHOS 65 03/08/2022 1514   AST 25 03/08/2022 1514   AST 19 08/23/2021 1312   ALT 29 03/08/2022 1514   ALT 17 08/23/2021 1312   BILITOT 0.5 03/08/2022 1514   BILITOT 0.4 08/23/2021 1312       RADIOGRAPHIC STUDIES: VAS US CAROTID  Result Date: 05/08/2022 Carotid Arterial Duplex Study Patient Name:  Derryl Uher.  Date of Exam:   05/08/2022 Medical Rec #: [id number]             Accession #:  [id number] Date of Birth: June 26, 1946              Patient Gender: M Patient Age:   76 years Exam Location:  [facility] Procedure:      VAS US CAROTID Referring Phys: Daneen Schick --------------------------------------------------------------------------------  Indications:       Right bruit, Carotid artery disease and patient denies any                    cerebrovascular symptoms. Risk Factors:      Hypertension, hyperlipidemia, Diabetes, past history of                    smoking, prior MI, coronary artery disease. Other Factors:     CABG in 2004. Comparison Study:  In 07/2021, a carotid duplex showed velocities of 85/14 cm/s                    in the RICA and 38/10 cm/s in the LICA. Performing Technologist: Sharlett Iles RVT  Examination Guidelines: A complete evaluation includes B-mode imaging, spectral Doppler, color Doppler, and power Doppler as needed of all accessible portions of each vessel. Bilateral testing is considered an integral part of a complete examination. Limited examinations for reoccurring indications may be performed as noted.  Right Carotid Findings: +----------+--------+--------+--------+------------------+--------+           PSV cm/sEDV cm/sStenosisPlaque  DescriptionComments +----------+--------+--------+--------+------------------+--------+ CCA Prox  68      7                                 tortuous +----------+--------+--------+--------+------------------+--------+ CCA Distal90      20                                         +----------+--------+--------+--------+------------------+--------+ ICA Prox  73      16      1-39%   heterogenous               +----------+--------+--------+--------+------------------+--------+ ICA Mid   58      17                                         +----------+--------+--------+--------+------------------+--------+ ICA Distal46      15                                tortuous +----------+--------+--------+--------+------------------+--------+ ECA       103     9               heterogenous               +----------+--------+--------+--------+------------------+--------+ +----------+--------+-------+---------+-------------------+           PSV cm/sEDV cmsDescribe Arm Pressure (mmHG) +----------+--------+-------+---------+-------------------+ Subclavian240            Turbulent128                 +----------+--------+-------+---------+-------------------+ +---------+--------+--+--------+-+---------+ VertebralPSV cm/s34EDV cm/s8Antegrade +---------+--------+--+--------+-+---------+ Prominent proximal RICA, with stable dimensions, measuring 1.2 cm AP x 1.2 cm TRV.  Left Carotid Findings: +----------+--------+--------+--------+------------------+--------+           PSV cm/sEDV cm/sStenosisPlaque DescriptionComments +----------+--------+--------+--------+------------------+--------+ CCA Prox  89  7                                 tortuous +----------+--------+--------+--------+------------------+--------+ CCA Distal77      16                                         +----------+--------+--------+--------+------------------+--------+ ICA Prox  56      11       1-39%   heterogenous               +----------+--------+--------+--------+------------------+--------+ ICA Mid   64      22                                         +----------+--------+--------+--------+------------------+--------+ ICA Distal73      25                                         +----------+--------+--------+--------+------------------+--------+ ECA       149     14                                         +----------+--------+--------+--------+------------------+--------+ +----------+--------+--------+---------+-------------------+           PSV cm/sEDV cm/sDescribe Arm Pressure (mmHG) +----------+--------+--------+---------+-------------------+ [id number]             Turbulent128                 +----------+--------+--------+---------+-------------------+ +---------+--------+--+--------+--+---------+ VertebralPSV cm/s42EDV cm/s12Antegrade +---------+--------+--+--------+--+---------+ Prominent proximal LICA, with stable dimensions, measuring 1.2 cm AP x 1.2 cm TRV.  Summary: Right Carotid: Velocities in the right ICA are consistent with a 1-39% stenosis. Left Carotid: Velocities in the left ICA are consistent with a 1-39% stenosis. Vertebrals:  Bilateral vertebral arteries demonstrate antegrade flow. Subclavians: Bilateral subclavian artery flow was disturbed. *See table(s) above for measurements and observations.     Preliminary    CT ANGIO CHEST AORTA W/CM & OR WO/CM  Result Date: 04/14/2022 CLINICAL DATA:  Follow-up thoracic aortic aneurysm. Diagnosed with lung cancer last year. Shortness of breath on exertion. EXAM: CT ANGIOGRAPHY CHEST WITH CONTRAST TECHNIQUE: Multidetector CT imaging of the chest was performed using the standard protocol during bolus administration of intravenous contrast. Multiplanar CT image reconstructions and MIPs were obtained to evaluate the vascular anatomy. RADIATION DOSE REDUCTION: This exam was performed according to  the departmental dose-optimization program which includes automated exposure control, adjustment of the mA and/or kV according to patient size and/or use of iterative reconstruction technique. CONTRAST:  133mL OMNIPAQUE IOHEXOL 350 MG/ML SOLN COMPARISON:  05/19/2021 FINDINGS: Cardiovascular: Median sternotomy wires are present. Mild stable cardiomegaly. Calcified plaque over the left main and 3 vessel coronary arteries. Stable 4.1 cm aneurysm of the ascending thoracic aorta. Aortic root measures 4.2 cm and sinotubular junction measures 3.3 cm. Proximal arch measures 3.5 cm and distal arch measures 3 cm in diameter unchanged. Very minimal calcified plaque over the thoracic aorta. Remaining vascular structures are unchanged. Mediastinum/Nodes: No mediastinal or hilar adenopathy. Remaining mediastinal structures are normal. Lungs/Pleura: Lungs are adequately inflated. There are  postsurgical changes over the right upper lobe compatible resection of patient's known lung cancer. Minimal peripheral fibrotic change over the right upper lobe. Right lung is otherwise clear. Left lung is clear. Airways are normal. Upper Abdomen: Cholelithiasis. 2 cm simple cyst over the upper pole right kidney unchanged as no follow-up imaging recommended. Mild calcified plaque over the abdominal aorta. No acute findings. Musculoskeletal: Mild degenerative change of the spine. Review of the MIP images confirms the above findings. IMPRESSION: 1. No acute cardiopulmonary disease. Postsurgical changes over the right upper lobe compatible with resection of patient's known lung cancer. No evidence of metastatic disease. 2. Stable 4.1 cm aneurysm of the ascending thoracic aorta. Recommend annual imaging followup by CTA or MRA. This recommendation follows 2010 ACCF/AHA/AATS/ACR/ASA/SCA/SCAI/SIR/STS/SVM Guidelines for the Diagnosis and Management of Patients with Thoracic Aortic Disease. Circulation. 2010; 121: B939-Q300. Aortic aneurysm NOS  (ICD10-I71.9). 3. Cholelithiasis. 4. 2 cm simple right renal cyst unchanged. No follow-up imaging recommended. 5. Aortic atherosclerosis. Atherosclerotic coronary artery disease. Aortic Atherosclerosis (ICD10-I70.0). Electronically Signed   By: Marin Olp M.D.   On: 04/14/2022 11:51    ASSESSMENT AND PLAN: This is a very pleasant 76 years old white male with Stage IA (T1c, N0, M0) non-small cell lung cancer, adenocarcinoma presented with right upper lobe lung nodule diagnosed in December 2022 status post right upper lobe posterior segmentectomy with lymph node dissection under the care of Dr. Roxan Hockey on August 05, 2021. The patient is currently on observation and he is feeling fine with no concerning complaints. He had repeat CT scan of the chest performed recently.  I personally and independently reviewed the scan and discussed the result with the patient today. His scan showed no concerning findings for disease recurrence or metastasis. I recommended for him to continue on observation with repeat CT scan of the chest in 6 months. Regarding the coronary artery disease, he was advised to follow-up with his primary care physician and cardiologist for management of this condition. He was advised to call immediately if he has any other concerning symptoms in the interval. The patient voices understanding of current disease status and treatment options and is in agreement with the current care plan.  All questions were answered. The patient knows to call the clinic with any problems, questions or concerns. We can certainly see the patient much sooner if necessary.  The total time spent in the appointment was 25 minutes.  Disclaimer: This note was dictated with voice recognition software. Similar sounding words can inadvertently be transcribed and may not be corrected upon review.

## 2022-05-11 DIAGNOSIS — L918 Other hypertrophic disorders of the skin: Secondary | ICD-10-CM | POA: Diagnosis not present

## 2022-05-11 DIAGNOSIS — D225 Melanocytic nevi of trunk: Secondary | ICD-10-CM | POA: Diagnosis not present

## 2022-05-11 DIAGNOSIS — D3617 Benign neoplasm of peripheral nerves and autonomic nervous system of trunk, unspecified: Secondary | ICD-10-CM | POA: Diagnosis not present

## 2022-05-11 DIAGNOSIS — L821 Other seborrheic keratosis: Secondary | ICD-10-CM | POA: Diagnosis not present

## 2022-05-11 DIAGNOSIS — D1801 Hemangioma of skin and subcutaneous tissue: Secondary | ICD-10-CM | POA: Diagnosis not present

## 2022-05-11 DIAGNOSIS — L433 Subacute (active) lichen planus: Secondary | ICD-10-CM | POA: Diagnosis not present

## 2022-05-11 DIAGNOSIS — L570 Actinic keratosis: Secondary | ICD-10-CM | POA: Diagnosis not present

## 2022-05-16 ENCOUNTER — Ambulatory Visit (INDEPENDENT_AMBULATORY_CARE_PROVIDER_SITE_OTHER): Payer: Medicare Other | Admitting: Thoracic Surgery (Cardiothoracic Vascular Surgery)

## 2022-05-16 ENCOUNTER — Encounter: Payer: Self-pay | Admitting: Thoracic Surgery (Cardiothoracic Vascular Surgery)

## 2022-05-16 VITALS — BP 150/72 | HR 56 | Resp 20 | Ht 71.0 in | Wt 235.0 lb

## 2022-05-16 DIAGNOSIS — C3491 Malignant neoplasm of unspecified part of right bronchus or lung: Secondary | ICD-10-CM | POA: Diagnosis not present

## 2022-05-16 DIAGNOSIS — Z902 Acquired absence of lung [part of]: Secondary | ICD-10-CM | POA: Diagnosis not present

## 2022-05-16 DIAGNOSIS — I25700 Atherosclerosis of coronary artery bypass graft(s), unspecified, with unstable angina pectoris: Secondary | ICD-10-CM

## 2022-05-16 NOTE — Progress Notes (Signed)
[address]       [place] [address]             [phone]     HPI: Mr. Pfefferle returns for follow-up of his ascending aneurysm and stage Ia adenocarcinoma  Perry [name] is a 76 year old man with history of tobacco abuse (quit 1980), CAD, CABG, ascending aneurysm, hypertension, hyperlipidemia, sleep apnea, paroxysmal atrial fibrillation, and glucose intolerance.  He was being followed for a 4.1 cm ascending aneurysm.  On a CT in 2019 he was noted to have a groundglass opacity.  The nodule increased in size over time.  I did a right upper lobe posterior segmentectomy on 08/05/2021.  It was a stage Ia adenocarcinoma.  He did well postoperatively.  He did develop a cough which took a couple of months to resolve but ultimately that resolved completely.  He recently saw Dr. Julien Nordmann.  He had no evidence of recurrent disease.  He feels well.  He is not having any issues with chest pain or shortness of breath.  No change in appetite or weight loss.  Past Medical History:  Diagnosis Date   A-fib ([facility]) 04/07/2014   Abscess of abdominal cavity ([facility]) 05/04/2014   Acute appendicitis with perforation and peritoneal abscess 05/04/2014   Allergy    Anginal pain ([facility])    CAD (coronary artery disease)    CAD (coronary artery disease) of artery bypass graft 05/04/2014   Cancer ([facility]) 07/2021   lung   CHF (congestive heart failure) ([facility])    pt not aware   Diabetes mellitus    not on medication - pt not aware of this   Dyslipidemia 05/04/2014   Dysrhythmia    Hearing loss    mild   Hernia, inguinal    right   Hyperglycemia    Hypertension    Ileus, postoperative ([facility]) 05/04/2014   Lumbar disc disease    Old inferior wall myocardial infarction 08/07/2002    Current Outpatient Medications  Medication Sig Dispense Refill   aspirin (HM ASPIRIN) 81 MG chewable tablet Chew 1 tablet (81 mg total) by mouth daily. (Patient taking differently: Chew 81 mg by mouth daily. EC)  90 tablet 3   BELSOMRA 15 MG TABS TAKE 1 TABLET BY MOUTH AT BEDTIME AS NEEDED 30 tablet 5   canagliflozin (INVOKANA) 100 MG TABS tablet Take 1 tablet (100 mg total) by mouth daily before breakfast. 90 tablet 1   fenofibrate (TRICOR) 145 MG tablet TAKE 1 TABLET BY MOUTH DAILY 30 tablet 5   fish oil-omega-3 fatty acids 1000 MG capsule Take 2 g by mouth daily.     lisinopril (ZESTRIL) 40 MG tablet TAKE 1 TABLET BY MOUTH DAILY 90 tablet 1   metFORMIN (GLUCOPHAGE-XR) 500 MG 24 hr tablet TAKE 2 TABLETS BY MOUTH EVERY MORNING 60 tablet 5   metoprolol succinate (TOPROL-XL) 50 MG 24 hr tablet Take 1 tablet (50 mg total) by mouth daily. 90 tablet 1   Multiple Vitamin (MULTIVITAMIN ADULT PO) Take 1 tablet by mouth daily.     nitroGLYCERIN (NITROSTAT) 0.4 MG SL tablet Place 1 tablet (0.4 mg total) under the tongue every 5 (five) minutes as needed for chest pain. 25 tablet 5   rosuvastatin (CRESTOR) 10 MG tablet Take 1 tablet (10 mg total) by mouth daily. 90 tablet 1   No current facility-administered medications for this visit.    Physical Exam BP (!) 150/72 (BP Location: Right Arm, Patient Position: Sitting, Cuff Size:  Large)   Pulse (!) 56   Resp 20   Ht 5\' 11"  (1.803 m)   Wt 235 lb (106.6 kg)   SpO2 98% Comment: RA  BMI 32.71 kg/m  75 year old man in no acute distress Alert and oriented x3 with no focal deficits Lungs clear with equal breath sounds bilaterally No cervical or supraclavicular adenopathy Cardiac slightly irregular  Diagnostic Tests: CT ANGIOGRAPHY CHEST WITH CONTRAST   TECHNIQUE: Multidetector CT imaging of the chest was performed using the standard protocol during bolus administration of intravenous contrast. Multiplanar CT image reconstructions and MIPs were obtained to evaluate the vascular anatomy.   RADIATION DOSE REDUCTION: This exam was performed according to the departmental dose-optimization program which includes automated exposure control, adjustment of the mA  and/or kV according to patient size and/or use of iterative reconstruction technique.   CONTRAST:  19mL OMNIPAQUE IOHEXOL 350 MG/ML SOLN   COMPARISON:  05/19/2021   FINDINGS: Cardiovascular: Median sternotomy wires are present. Mild stable cardiomegaly. Calcified plaque over the left main and 3 vessel coronary arteries. Stable 4.1 cm aneurysm of the ascending thoracic aorta. Aortic root measures 4.2 cm and sinotubular junction measures 3.3 cm. Proximal arch measures 3.5 cm and distal arch measures 3 cm in diameter unchanged. Very minimal calcified plaque over the thoracic aorta. Remaining vascular structures are unchanged.   Mediastinum/Nodes: No mediastinal or hilar adenopathy. Remaining mediastinal structures are normal.   Lungs/Pleura: Lungs are adequately inflated. There are postsurgical changes over the right upper lobe compatible resection of patient's known lung cancer. Minimal peripheral fibrotic change over the right upper lobe. Right lung is otherwise clear. Left lung is clear. Airways are normal.   Upper Abdomen: Cholelithiasis. 2 cm simple cyst over the upper pole right kidney unchanged as no follow-up imaging recommended. Mild calcified plaque over the abdominal aorta. No acute findings.   Musculoskeletal: Mild degenerative change of the spine.   Review of the MIP images confirms the above findings.   IMPRESSION: 1. No acute cardiopulmonary disease. Postsurgical changes over the right upper lobe compatible with resection of patient's known lung cancer. No evidence of metastatic disease. 2. Stable 4.1 cm aneurysm of the ascending thoracic aorta. Recommend annual imaging followup by CTA or MRA. This recommendation follows 2010 ACCF/AHA/AATS/ACR/ASA/SCA/SCAI/SIR/STS/SVM Guidelines for the Diagnosis and Management of Patients with Thoracic Aortic Disease. Circulation. 2010; 121: E993-Z169. Aortic aneurysm NOS (ICD10-I71.9). 3. Cholelithiasis. 4. 2 cm simple right  renal cyst unchanged. No follow-up imaging recommended. 5. Aortic atherosclerosis. Atherosclerotic coronary artery disease.   Aortic Atherosclerosis (ICD10-I70.0).     Electronically Signed   By: Marin Olp M.D.   On: 04/14/2022 11:51 I personally reviewed the CT images.  Ascending aneurysm is stable.  Postoperative changes from segmentectomy.  No evidence of recurrent disease.  Aortic and coronary atherosclerotic disease.  Impression: Perry [name] is a 76 year old man with history of tobacco abuse (quit 1980), CAD, CABG, ascending aneurysm, aortic atherosclerosis, hypertension, hyperlipidemia, sleep apnea, paroxysmal atrial fibrillation, and glucose intolerance.  Stage Ia adenocarcinoma of the lung-now almost a year out from surgery.  Doing well with no evidence of recurrent disease.  Ascending thoracic aneurysm-  stable at 4.1 cm.  Needs continued annual follow-up.  Importance of blood pressure control was emphasized.  Coronary and aortic atherosclerosis-on rosuvastatin.  Asymptomatic  Hypertension-blood pressure mildly elevated systolic of 678 today.  He does not check himself at home.  I recommended he get a cuff and check himself on a regular basis. He is on Toprol and lisinopril.  Plan: Follow-up with Dr. Julien Nordmann as scheduled in 6 months Return in 1 year after CT chest  Melrose Nakayama, MD [facility] [phone]

## 2022-05-20 NOTE — Progress Notes (Unsigned)
Cardiology Office Note:    Date:  05/22/2022   ID:  Perry [name]., DOB 06/02/1946, MRN [id number]  PCP:  Perry Mclean, MD  Cardiologist:  Sinclair Grooms, MD   Referring MD: Perry Mclean, MD   Chief Complaint  Patient presents with   Shortness of Breath   Coronary Artery Disease   Hypertension    History of Present Illness:    Perry [name]. is a 76 y.o. male with a hx of asthma, hyperlipidemia, PAF, coronary artery disease, status post coronary artery bypass graft 2004, thoracic aortic aneurysm, hypertension, diabetes mellitus type II, obstructive sleep apnea, and dyslipidemia.   He is doing well.  He has a small thoracic aneurysm has been followed over the last many years and has been stable measuring 4.1 cm.  He had coronary bypass grafting in 2004.  He is a former patient of Dr. Wynonia Lawman.  He denies angina, orthopnea, PND, lower extremity swelling, bleeding, but does have some dyspnea on exertion.  He does have dyspnea on exertion.  There is no orthopnea PND.  No accompanying chest discomfort.  Past Medical History:  Diagnosis Date   A-fib ([place]) 04/07/2014   Abscess of abdominal cavity ([place]) 05/04/2014   Acute appendicitis with perforation and peritoneal abscess 05/04/2014   Allergy    Anginal pain ([place])    CAD (coronary artery disease)    CAD (coronary artery disease) of artery bypass graft 05/04/2014   Cancer ([place]) 07/2021   lung   CHF (congestive heart failure) ([place])    pt not aware   Diabetes mellitus    not on medication - pt not aware of this   Dyslipidemia 05/04/2014   Dysrhythmia    Hearing loss    mild   Hernia, inguinal    right   Hyperglycemia    Hypertension    Ileus, postoperative ([place]) 05/04/2014   Lumbar disc disease    Old inferior wall myocardial infarction 08/07/2002    Past Surgical History:  Procedure Laterality Date   COLONOSCOPY     CORONARY ANGIOPLASTY     multiple   CORONARY ARTERY BYPASS GRAFT  2004    LIMA to LAD and SVG to RCA (emergent)   INTERCOSTAL NERVE BLOCK Right 08/05/2021   Procedure: INTERCOSTAL NERVE BLOCK;  Surgeon: Melrose Nakayama, MD;  Location: [place] [facility];  Service: Thoracic;  Laterality: Right;   LAPAROSCOPIC APPENDECTOMY N/A 04/21/2014   Procedure: APPENDECTOMY LAPAROSCOPIC;  Surgeon: Rolm Bookbinder, MD;  Location: [facility];  Service: General;  Laterality: N/A;   LAPAROSCOPIC APPENDECTOMY N/A 06/24/2014   Procedure: APPENDECTOMY LAPAROSCOPIC ;  Surgeon: Rolm Bookbinder, MD;  Location: [place];  Service: General;  Laterality: N/A;   LUMBAR LAMINECTOMY  2001   lumbar   LYMPH NODE DISSECTION Right 08/05/2021   Procedure: LYMPH NODE DISSECTION;  Surgeon: Melrose Nakayama, MD;  Location: [facility];  Service: Thoracic;  Laterality: Right;   TONSILLECTOMY     XI ROBOTIC ASSISTED THORACOSCOPY- SEGMENTECTOMY Right 08/05/2021   Procedure: XI ROBOTIC ASSISTED THORACOSCOPY- RIGHT UPPER LOBE POSTERIOR SEGMENTECTOMY, possible lobectomy;  Surgeon: Melrose Nakayama, MD;  Location: [facility];  Service: Thoracic;  Laterality: Right;    Current Medications: Current Meds  Medication Sig   aspirin (HM ASPIRIN) 81 MG chewable tablet Chew 1 tablet (81 mg total) by mouth daily. (Patient taking differently: Chew 81 mg by mouth daily. EC)   BELSOMRA 15 MG TABS TAKE 1 TABLET BY MOUTH AT BEDTIME AS  NEEDED   canagliflozin (INVOKANA) 100 MG TABS tablet Take 1 tablet (100 mg total) by mouth daily before breakfast.   fenofibrate (TRICOR) 145 MG tablet TAKE 1 TABLET BY MOUTH DAILY   fish oil-omega-3 fatty acids 1000 MG capsule Take 2 g by mouth daily.   lisinopril (ZESTRIL) 40 MG tablet TAKE 1 TABLET BY MOUTH DAILY   metFORMIN (GLUCOPHAGE-XR) 500 MG 24 hr tablet TAKE 2 TABLETS BY MOUTH EVERY MORNING   metoprolol succinate (TOPROL-XL) 50 MG 24 hr tablet Take 1 tablet (50 mg total) by mouth daily.   Multiple Vitamin (MULTIVITAMIN ADULT PO) Take 1 tablet by mouth daily.   nitroGLYCERIN (NITROSTAT) 0.4  MG SL tablet Place 1 tablet (0.4 mg total) under the tongue every 5 (five) minutes as needed for chest pain.   rosuvastatin (CRESTOR) 10 MG tablet Take 1 tablet (10 mg total) by mouth daily.     Allergies:   Patient has no known allergies.   Social History   Socioeconomic History   Marital status: Married    Spouse name: [name]   Number of children: 2   Years of education: Masters   Highest education level: Not on file  Occupational History   Occupation: Clinical biochemist  Tobacco Use   Smoking status: Former    Types: Cigarettes    Quit date: 06/24/1979    Years since quitting: 42.9   Smokeless tobacco: Former    Types: Nurse, children's Use: Never used  Substance and Sexual Activity   Alcohol use: Yes    Alcohol/week: 14.0 standard drinks of alcohol    Types: 14 Cans of beer per week    Comment: 2 drinks a day   Drug use: No   Sexual activity: Yes  Other Topics Concern   Not on file  Social History Narrative   Patient is married [name]) and lives at home with his wife.   Patient has two adult children.   Patient is working full-time.   Patient has a Master's degree.   Patient drinks one cup of coffee every morning.   Patient is right-handed.   Social Determinants of Health   Financial Resource Strain: Not on file  Food Insecurity: Not on file  Transportation Needs: Not on file  Physical Activity: Not on file  Stress: Not on file  Social Connections: Not on file     Family History: The patient's family history includes Cancer in his sister; Heart disease in his brother, father, and paternal grandmother; Hypertension in his brother.  ROS:   Please see the history of present illness.    Has difficulty sleeping.  Working with Dr. Brett Fairy on this.  All other systems reviewed and are negative.  EKGs/Labs/Other Studies Reviewed:    The following studies were reviewed today:  CT SCAN 04/2022: IMPRESSION: 1. No acute cardiopulmonary disease.  Postsurgical changes over the right upper lobe compatible with resection of patient's known lung cancer. No evidence of metastatic disease. 2. Stable 4.1 cm aneurysm of the ascending thoracic aorta. Recommend annual imaging followup by CTA or MRA. This recommendation follows 2010 ACCF/AHA/AATS/ACR/ASA/SCA/SCAI/SIR/STS/SVM Guidelines for the Diagnosis and Management of Patients with Thoracic Aortic Disease. Circulation. 2010; 121: Q222-L798. Aortic aneurysm NOS (ICD10-I71.9). 3. Cholelithiasis. 4. 2 cm simple right renal cyst unchanged. No follow-up imaging recommended. 5. Aortic atherosclerosis. Atherosclerotic coronary artery disease.   Aortic Atherosclerosis (ICD10-I70.0).   Carotid Doppler 05/08/2022:  Summary:  Right Carotid: Velocities in the right ICA are consistent with a 1-39%  stenosis.   Left Carotid: Velocities in the left ICA are consistent with a 1-39%  stenosis.   Vertebrals:  Bilateral vertebral arteries demonstrate antegrade flow.  Subclavians: Bilateral subclavian artery flow was disturbed.   EKG:  EKG first-degree AV block (230 ms), PVC, old inferior infarct, with sinus bradycardia.  When compared to the prior tracing from November 2022, PVC is new and the PR interval is unchanged.  Recent Labs: 05/10/2022: ALT 26; BUN 24; Creatinine 1.02; Hemoglobin 13.7; Platelet Count 181; Potassium 3.9; Sodium 139  Recent Lipid Panel    Component Value Date/Time   CHOL 107 03/08/2022 1514   CHOL 130 08/20/2020 0950   TRIG 287.0 (H) 03/08/2022 1514   HDL 31.10 (L) 03/08/2022 1514   HDL 40 08/20/2020 0950   CHOLHDL 3 03/08/2022 1514   VLDL 57.4 (H) 03/08/2022 1514   LDLCALC 41 05/30/2021 1529   LDLCALC 71 08/20/2020 0950   LDLCALC 54 04/21/2020 0843   LDLDIRECT 54.0 03/08/2022 1514    Physical Exam:    VS:  BP 134/70   Pulse (!) 58   Ht 5\' 11"  (1.803 m)   Wt 235 lb 9.6 oz (106.9 kg)   SpO2 96%   BMI 32.86 kg/m     Wt Readings from Last 3 Encounters:   05/22/22 235 lb 9.6 oz (106.9 kg)  05/16/22 235 lb (106.6 kg)  05/10/22 234 lb 11.2 oz (106.5 kg)     GEN: Overweight. No acute distress HEENT: Normal NECK: No JVD. LYMPHATICS: No lymphadenopathy CARDIAC: No murmur.  Irregular bigeminal rhythm no gallop, or edema. VASCULAR:  Normal Pulses. No bruits. RESPIRATORY:  Clear to auscultation without rales, wheezing or rhonchi  ABDOMEN: Soft, non-tender, non-distended, No pulsatile mass, MUSCULOSKELETAL: No deformity  SKIN: Warm and dry NEUROLOGIC:  Alert and oriented x 3 PSYCHIATRIC:  Normal affect   ASSESSMENT:    1. Aneurysm of ascending aorta without rupture ([place])   2. Coronary artery disease involving coronary bypass graft of native heart without angina pectoris   3. Dyspnea on exertion   4. Paroxysmal atrial fibrillation ([facility])   5. Essential hypertension   6. OSA (obstructive sleep apnea)   7. Controlled type 2 diabetes mellitus with other circulatory complication, without long-term current use of insulin ([place])   8. Atherosclerosis of both carotid arteries    PLAN:    In order of problems listed above:  4.1 cm.  MR angio of the aorta and 1 year.  Stable from standpoint of coronary disease.  Continue secondary prevention with high intensity statin therapy and blood pressure control. 2D Doppler echocardiogram to rule out systolic and/or diastolic heart failure. Maintaining sinus rhythm on today's EKG Excellent blood pressure control He is unable to tolerate CPAP Continue follow-up with primary care He has a heightened sense of awareness and sensitivity for carotid occlusive disease because a friend recently had CVA.  Plan follow-up carotid Doppler in a year and if stable probably does not need to have it done on a yearly basis.  Continue primary prevention.  Overall education and awareness concerning primary/secondary risk prevention was discussed in detail: LDL less than 70, hemoglobin A1c less than 7, blood pressure target  less than 130/80 mmHg, >150 minutes of moderate aerobic activity per week, avoidance of smoking, weight control (via diet and exercise), and continued surveillance/management of/for obstructive sleep apnea.  Dr. Marlou Porch would be a nice primary cardiologist for this patient if possible in 1 year.    Medication Adjustments/Labs and Tests Ordered: Current  medicines are reviewed at length with the patient today.  Concerns regarding medicines are outlined above.  No orders of the defined types were placed in this encounter.  No orders of the defined types were placed in this encounter.   Patient Instructions  Medication Instructions:  Your physician recommends that you continue on your current medications as directed. Please refer to the Current Medication list given to you today.  *If you need a refill on your cardiac medications before your next appointment, please call your pharmacy*  Lab Work: NONE  Testing/Procedures: Your physician has requested that you have an echocardiogram in October/November/December 2023. Echocardiography is a painless test that uses sound waves to create images of your heart. It provides your doctor with information about the size and shape of your heart and how well your heart's chambers and valves are working. This procedure takes approximately one hour. There are no restrictions for this procedure. Please do NOT wear cologne, perfume, aftershave, or lotions (deodorant is allowed). Please arrive 15 minutes prior to your appointment time.  Your physician requests you have an ultrasound of your bilateral carotid arteries performed in September 2024, prior to follow-up office visit.  Your physician requests you have a MR angio chest, to monitor ascending aortic aneurysm, to be performed in September 2024, prior to follow-up office visit.  Follow-Up: At [facility], you and your health needs are our priority.  As part of our continuing mission to  provide you with exceptional heart care, we have created designated Provider Care Teams.  These Care Teams include your primary Cardiologist (physician) and Advanced Practice Providers (APPs -  Physician Assistants and Nurse Practitioners) who all work together to provide you with the care you need, when you need it.  Your next appointment:   1 year(s)  The format for your next appointment:   In Person  Provider:   Candee Furbish, MD   Important Information About Sugar         Signed, Sinclair Grooms, MD  05/22/2022 2:46 PM    [place]

## 2022-05-22 ENCOUNTER — Encounter: Payer: Self-pay | Admitting: Interventional Cardiology

## 2022-05-22 ENCOUNTER — Ambulatory Visit: Payer: Medicare Other | Attending: Interventional Cardiology | Admitting: Interventional Cardiology

## 2022-05-22 ENCOUNTER — Encounter: Payer: Self-pay | Admitting: Family Medicine

## 2022-05-22 VITALS — BP 134/70 | HR 58 | Ht 71.0 in | Wt 235.6 lb

## 2022-05-22 DIAGNOSIS — G4733 Obstructive sleep apnea (adult) (pediatric): Secondary | ICD-10-CM | POA: Insufficient documentation

## 2022-05-22 DIAGNOSIS — I10 Essential (primary) hypertension: Secondary | ICD-10-CM | POA: Diagnosis not present

## 2022-05-22 DIAGNOSIS — I480 Paroxysmal atrial fibrillation: Secondary | ICD-10-CM | POA: Insufficient documentation

## 2022-05-22 DIAGNOSIS — R0609 Other forms of dyspnea: Secondary | ICD-10-CM | POA: Diagnosis not present

## 2022-05-22 DIAGNOSIS — E1159 Type 2 diabetes mellitus with other circulatory complications: Secondary | ICD-10-CM | POA: Diagnosis not present

## 2022-05-22 DIAGNOSIS — I7121 Aneurysm of the ascending aorta, without rupture: Secondary | ICD-10-CM | POA: Diagnosis not present

## 2022-05-22 DIAGNOSIS — I6523 Occlusion and stenosis of bilateral carotid arteries: Secondary | ICD-10-CM | POA: Diagnosis not present

## 2022-05-22 DIAGNOSIS — I25810 Atherosclerosis of coronary artery bypass graft(s) without angina pectoris: Secondary | ICD-10-CM | POA: Insufficient documentation

## 2022-05-22 NOTE — Patient Instructions (Addendum)
Medication Instructions:  Your physician recommends that you continue on your current medications as directed. Please refer to the Current Medication list given to you today.  *If you need a refill on your cardiac medications before your next appointment, please call your pharmacy*  Lab Work: NONE  Testing/Procedures: Your physician has requested that you have an echocardiogram in October/November/December 2023. Echocardiography is a painless test that uses sound waves to create images of your heart. It provides your doctor with information about the size and shape of your heart and how well your heart's chambers and valves are working. This procedure takes approximately one hour. There are no restrictions for this procedure. Please do NOT wear cologne, perfume, aftershave, or lotions (deodorant is allowed). Please arrive 15 minutes prior to your appointment time.  Your physician requests you have an ultrasound of your bilateral carotid arteries performed in September 2024, prior to follow-up office visit.  Your physician requests you have a MR angio chest, to monitor ascending aortic aneurysm, to be performed in September 2024, prior to follow-up office visit.  Follow-Up: At [facility], you and your health needs are our priority.  As part of our continuing mission to provide you with exceptional heart care, we have created designated Provider Care Teams.  These Care Teams include your primary Cardiologist (physician) and Advanced Practice Providers (APPs -  Physician Assistants and Nurse Practitioners) who all work together to provide you with the care you need, when you need it.  Your next appointment:   6 months  The format for your next appointment:   In Person  Provider:   Candee Furbish, MD   Important Information About Sugar

## 2022-05-25 ENCOUNTER — Other Ambulatory Visit: Payer: Self-pay | Admitting: Family Medicine

## 2022-05-25 DIAGNOSIS — I10 Essential (primary) hypertension: Secondary | ICD-10-CM

## 2022-05-25 DIAGNOSIS — I25700 Atherosclerosis of coronary artery bypass graft(s), unspecified, with unstable angina pectoris: Secondary | ICD-10-CM

## 2022-05-31 DIAGNOSIS — N5201 Erectile dysfunction due to arterial insufficiency: Secondary | ICD-10-CM | POA: Diagnosis not present

## 2022-05-31 DIAGNOSIS — N400 Enlarged prostate without lower urinary tract symptoms: Secondary | ICD-10-CM | POA: Diagnosis not present

## 2022-06-02 ENCOUNTER — Telehealth: Payer: Self-pay

## 2022-06-02 NOTE — Telephone Encounter (Signed)
Patient called asking if he needed both an MR Angio Chest (ordered by Dr. Tamala Julian) and a CT Chest next fall.

## 2022-06-05 ENCOUNTER — Encounter: Payer: Self-pay | Admitting: Medical Oncology

## 2022-06-05 ENCOUNTER — Ambulatory Visit (HOSPITAL_COMMUNITY): Payer: Medicare Other | Attending: Interventional Cardiology

## 2022-06-05 DIAGNOSIS — R0609 Other forms of dyspnea: Secondary | ICD-10-CM | POA: Diagnosis not present

## 2022-06-05 LAB — ECHOCARDIOGRAM COMPLETE
Area-P 1/2: 2.45 cm2
S' Lateral: 4.2 cm

## 2022-06-26 ENCOUNTER — Telehealth: Payer: Self-pay | Admitting: Interventional Cardiology

## 2022-06-26 NOTE — Telephone Encounter (Signed)
Returned call to patient.  Discussed echo results, per Dr. Tamala Julian: Let the patient know know that again the aorta is noted to be mildly dilated.  The heart strength is normal.  The heart is stiff which can cause shortness of breath.  A specific therapy for this is the SGLT2 therapies.  Jardiance or Farxiga 10 mg/day can be tried.  It may help his exertional dyspnea.  If he chooses to try the medication, please get a basic metabolic panel in 2 to 4 weeks.    Patient states his DOE is tolerable at this time. He will let us know if this changes and will consider Jardiance/Farxiga at that time. Patient states he is not interested in starting these medications right now, but will keep [place] updated if anything changes.  Patient verbalized understanding of echo results and Dr. Thompson Caul recommendations. He expressed appreciation for follow-up.

## 2022-06-26 NOTE — Telephone Encounter (Signed)
Patient is calling requesting his echo results. Please advise.

## 2022-06-28 DIAGNOSIS — Z23 Encounter for immunization: Secondary | ICD-10-CM | POA: Diagnosis not present

## 2022-09-04 DIAGNOSIS — M25562 Pain in left knee: Secondary | ICD-10-CM | POA: Diagnosis not present

## 2022-09-04 DIAGNOSIS — M25552 Pain in left hip: Secondary | ICD-10-CM | POA: Diagnosis not present

## 2022-09-21 ENCOUNTER — Other Ambulatory Visit: Payer: Self-pay | Admitting: Family Medicine

## 2022-09-21 DIAGNOSIS — E119 Type 2 diabetes mellitus without complications: Secondary | ICD-10-CM

## 2022-09-21 DIAGNOSIS — H04123 Dry eye syndrome of bilateral lacrimal glands: Secondary | ICD-10-CM | POA: Diagnosis not present

## 2022-09-21 DIAGNOSIS — E782 Mixed hyperlipidemia: Secondary | ICD-10-CM

## 2022-09-21 DIAGNOSIS — H40013 Open angle with borderline findings, low risk, bilateral: Secondary | ICD-10-CM | POA: Diagnosis not present

## 2022-09-21 DIAGNOSIS — I25700 Atherosclerosis of coronary artery bypass graft(s), unspecified, with unstable angina pectoris: Secondary | ICD-10-CM

## 2022-09-21 DIAGNOSIS — H26493 Other secondary cataract, bilateral: Secondary | ICD-10-CM | POA: Diagnosis not present

## 2022-09-21 LAB — HM DIABETES EYE EXAM

## 2022-10-19 DIAGNOSIS — M79605 Pain in left leg: Secondary | ICD-10-CM | POA: Diagnosis not present

## 2022-10-19 DIAGNOSIS — M25552 Pain in left hip: Secondary | ICD-10-CM | POA: Diagnosis not present

## 2022-10-22 ENCOUNTER — Other Ambulatory Visit: Payer: Self-pay | Admitting: Family Medicine

## 2022-10-22 DIAGNOSIS — E119 Type 2 diabetes mellitus without complications: Secondary | ICD-10-CM

## 2022-10-22 DIAGNOSIS — I25700 Atherosclerosis of coronary artery bypass graft(s), unspecified, with unstable angina pectoris: Secondary | ICD-10-CM

## 2022-10-22 DIAGNOSIS — E782 Mixed hyperlipidemia: Secondary | ICD-10-CM

## 2022-10-30 DIAGNOSIS — M79659 Pain in unspecified thigh: Secondary | ICD-10-CM | POA: Diagnosis not present

## 2022-10-30 DIAGNOSIS — M79605 Pain in left leg: Secondary | ICD-10-CM | POA: Diagnosis not present

## 2022-11-07 ENCOUNTER — Inpatient Hospital Stay: Payer: Medicare Other | Attending: Internal Medicine

## 2022-11-07 ENCOUNTER — Ambulatory Visit (HOSPITAL_COMMUNITY)
Admission: RE | Admit: 2022-11-07 | Discharge: 2022-11-07 | Disposition: A | Discharge status: Home / Self Care | Payer: Medicare Other | Source: Ambulatory Visit | Attending: Internal Medicine | Admitting: Internal Medicine

## 2022-11-07 DIAGNOSIS — Z79899 Other long term (current) drug therapy: Secondary | ICD-10-CM | POA: Diagnosis not present

## 2022-11-07 DIAGNOSIS — I2510 Atherosclerotic heart disease of native coronary artery without angina pectoris: Secondary | ICD-10-CM | POA: Insufficient documentation

## 2022-11-07 DIAGNOSIS — C3490 Malignant neoplasm of unspecified part of unspecified bronchus or lung: Secondary | ICD-10-CM | POA: Insufficient documentation

## 2022-11-07 DIAGNOSIS — I700 Atherosclerosis of aorta: Secondary | ICD-10-CM | POA: Diagnosis not present

## 2022-11-07 DIAGNOSIS — E785 Hyperlipidemia, unspecified: Secondary | ICD-10-CM | POA: Diagnosis not present

## 2022-11-07 DIAGNOSIS — I252 Old myocardial infarction: Secondary | ICD-10-CM | POA: Diagnosis not present

## 2022-11-07 DIAGNOSIS — E119 Type 2 diabetes mellitus without complications: Secondary | ICD-10-CM | POA: Diagnosis not present

## 2022-11-07 DIAGNOSIS — I509 Heart failure, unspecified: Secondary | ICD-10-CM | POA: Insufficient documentation

## 2022-11-07 DIAGNOSIS — Z7984 Long term (current) use of oral hypoglycemic drugs: Secondary | ICD-10-CM | POA: Diagnosis not present

## 2022-11-07 DIAGNOSIS — Z7982 Long term (current) use of aspirin: Secondary | ICD-10-CM | POA: Insufficient documentation

## 2022-11-07 DIAGNOSIS — C3411 Malignant neoplasm of upper lobe, right bronchus or lung: Secondary | ICD-10-CM | POA: Diagnosis not present

## 2022-11-07 DIAGNOSIS — J439 Emphysema, unspecified: Secondary | ICD-10-CM | POA: Diagnosis not present

## 2022-11-07 DIAGNOSIS — I110 Hypertensive heart disease with heart failure: Secondary | ICD-10-CM | POA: Insufficient documentation

## 2022-11-07 DIAGNOSIS — I4891 Unspecified atrial fibrillation: Secondary | ICD-10-CM | POA: Insufficient documentation

## 2022-11-07 DIAGNOSIS — I7121 Aneurysm of the ascending aorta, without rupture: Secondary | ICD-10-CM | POA: Diagnosis not present

## 2022-11-07 LAB — CBC WITH DIFFERENTIAL (CANCER CENTER ONLY)
Abs Immature Granulocytes: 0.02 10*3/uL (ref 0.00–0.07)
Basophils Absolute: 0 10*3/uL (ref 0.0–0.1)
Basophils Relative: 0 %
Eosinophils Absolute: 0.2 10*3/uL (ref 0.0–0.5)
Eosinophils Relative: 4 %
HCT: 42.8 % (ref 39.0–52.0)
Hemoglobin: 14.8 g/dL (ref 13.0–17.0)
Immature Granulocytes: 0 %
Lymphocytes Relative: 27 %
Lymphs Abs: 1.8 10*3/uL (ref 0.7–4.0)
MCH: 31.8 pg (ref 26.0–34.0)
MCHC: 34.6 g/dL (ref 30.0–36.0)
MCV: 92 fL (ref 80.0–100.0)
Monocytes Absolute: 0.7 10*3/uL (ref 0.1–1.0)
Monocytes Relative: 10 %
Neutro Abs: 3.9 10*3/uL (ref 1.7–7.7)
Neutrophils Relative %: 59 %
Platelet Count: 194 10*3/uL (ref 150–400)
RBC: 4.65 MIL/uL (ref 4.22–5.81)
RDW: 12.8 % (ref 11.5–15.5)
WBC Count: 6.7 10*3/uL (ref 4.0–10.5)
nRBC: 0 % (ref 0.0–0.2)

## 2022-11-07 LAB — CMP (CANCER CENTER ONLY)
ALT: 36 U/L (ref 0–44)
AST: 28 U/L (ref 15–41)
Albumin: 4.2 g/dL (ref 3.5–5.0)
Alkaline Phosphatase: 63 U/L (ref 38–126)
Anion gap: 6 (ref 5–15)
BUN: 24 mg/dL — ABNORMAL HIGH (ref 8–23)
CO2: 26 mmol/L (ref 22–32)
Calcium: 10.1 mg/dL (ref 8.9–10.3)
Chloride: 108 mmol/L (ref 98–111)
Creatinine: 0.97 mg/dL (ref 0.61–1.24)
GFR, Estimated: >60 mL/min (ref >60)
Glucose, Bld: 146 mg/dL — ABNORMAL HIGH (ref 70–99)
Potassium: 4.1 mmol/L (ref 3.5–5.1)
Sodium: 140 mmol/L (ref 135–145)
Total Bilirubin: 0.5 mg/dL (ref 0.3–1.2)
Total Protein: 7 g/dL (ref 6.5–8.1)

## 2022-11-07 MED ORDER — IOHEXOL 300 MG/ML  SOLN
75.0000 mL | Freq: Once | INTRAMUSCULAR | Status: AC | PRN
  Administered 2022-11-07: 75 mL via INTRAVENOUS

## 2022-11-09 ENCOUNTER — Inpatient Hospital Stay (HOSPITAL_BASED_OUTPATIENT_CLINIC_OR_DEPARTMENT_OTHER): Payer: Medicare Other | Admitting: Internal Medicine

## 2022-11-09 ENCOUNTER — Other Ambulatory Visit: Payer: Self-pay

## 2022-11-09 VITALS — BP 131/83 | HR 68 | Temp 98.1°F | Resp 16 | Wt 234.3 lb

## 2022-11-09 DIAGNOSIS — C3490 Malignant neoplasm of unspecified part of unspecified bronchus or lung: Secondary | ICD-10-CM | POA: Diagnosis not present

## 2022-11-09 DIAGNOSIS — I110 Hypertensive heart disease with heart failure: Secondary | ICD-10-CM | POA: Diagnosis not present

## 2022-11-09 DIAGNOSIS — E119 Type 2 diabetes mellitus without complications: Secondary | ICD-10-CM | POA: Diagnosis not present

## 2022-11-09 DIAGNOSIS — I509 Heart failure, unspecified: Secondary | ICD-10-CM | POA: Diagnosis not present

## 2022-11-09 DIAGNOSIS — C3411 Malignant neoplasm of upper lobe, right bronchus or lung: Secondary | ICD-10-CM | POA: Diagnosis not present

## 2022-11-09 DIAGNOSIS — E785 Hyperlipidemia, unspecified: Secondary | ICD-10-CM | POA: Diagnosis not present

## 2022-11-09 DIAGNOSIS — I252 Old myocardial infarction: Secondary | ICD-10-CM | POA: Diagnosis not present

## 2022-11-09 NOTE — Progress Notes (Signed)
[facility] [phone]   [phone]  OFFICE PROGRESS NOTE  Copland, Gay Filler, MD [address] [place] [address]  DIAGNOSIS: Stage IA (T1c, N0, M0) non-small cell lung cancer, adenocarcinoma presented with right upper lobe lung nodule diagnosed in December 2022.   PRIOR THERAPY: status post right upper lobe posterior segmentectomy with lymph node dissection under the care of Dr. Roxan Hockey on August 05, 2021.  CURRENT THERAPY: Observation  INTERVAL HISTORY: Perry [name]. 77 y.o. male turns to the clinic today for 6 months follow-up visit.  The patient is feeling fine today with no concerning complaints.  He denied having any chest pain, shortness of breath, cough or hemoptysis.  He has no nausea, vomiting, diarrhea or constipation.  He has no headache or visual changes.  He denied having any recent weight loss or night sweats.  He is here today for evaluation with repeat CT scan of the chest for restaging of his disease.  MEDICAL HISTORY: Past Medical History:  Diagnosis Date   A-fib ([place]) 04/07/2014   Abscess of abdominal cavity ([place]) 05/04/2014   Acute appendicitis with perforation and peritoneal abscess 05/04/2014   Allergy    Anginal pain ([facility])    CAD (coronary artery disease)    CAD (coronary artery disease) of artery bypass graft 05/04/2014   Cancer ([place]) 07/2021   lung   CHF (congestive heart failure) ([place])    pt not aware   Diabetes mellitus    not on medication - pt not aware of this   Dyslipidemia 05/04/2014   Dysrhythmia    Hearing loss    mild   Hernia, inguinal    right   Hyperglycemia    Hypertension    Ileus, postoperative ([place]) 05/04/2014   Lumbar disc disease    Old inferior wall myocardial infarction 08/07/2002    ALLERGIES:  has No Known Allergies.  MEDICATIONS:  Current Outpatient Medications  Medication Sig Dispense Refill   aspirin (HM ASPIRIN) 81 MG chewable tablet Chew 1  tablet (81 mg total) by mouth daily. (Patient taking differently: Chew 81 mg by mouth daily. EC) 90 tablet 3   BELSOMRA 15 MG TABS TAKE 1 TABLET BY MOUTH AT BEDTIME AS NEEDED 30 tablet 5   fenofibrate (TRICOR) 145 MG tablet Take 1 tablet (145 mg total) by mouth daily. 30 tablet 0   fish oil-omega-3 fatty acids 1000 MG capsule Take 2 g by mouth daily.     INVOKANA 100 MG TABS tablet TAKE 1 TABLET BY MOUTH EVERY DAY BEFORE BREAKFAST 90 tablet 1   lisinopril (ZESTRIL) 40 MG tablet Take 1 tablet (40 mg total) by mouth daily. 90 tablet 1   metFORMIN (GLUCOPHAGE-XR) 500 MG 24 hr tablet Take 2 tablets (1,000 mg total) by mouth every morning. 60 tablet 0   metoprolol succinate (TOPROL-XL) 50 MG 24 hr tablet TAKE 1 TABLET BY MOUTH DAILY 90 tablet 1   Multiple Vitamin (MULTIVITAMIN ADULT PO) Take 1 tablet by mouth daily.     nitroGLYCERIN (NITROSTAT) 0.4 MG SL tablet Place 1 tablet (0.4 mg total) under the tongue every 5 (five) minutes as needed for chest pain. 25 tablet 5   rosuvastatin (CRESTOR) 10 MG tablet TAKE 1 TABLET BY MOUTH DAILY 90 tablet 1   No current facility-administered medications for this visit.    SURGICAL HISTORY:  Past Surgical History:  Procedure Laterality Date   COLONOSCOPY     CORONARY  ANGIOPLASTY     multiple   CORONARY ARTERY BYPASS GRAFT  2004   LIMA to LAD and SVG to RCA (emergent)   INTERCOSTAL NERVE BLOCK Right 08/05/2021   Procedure: INTERCOSTAL NERVE BLOCK;  Surgeon: Melrose Nakayama, MD;  Location: [facility];  Service: Thoracic;  Laterality: Right;   LAPAROSCOPIC APPENDECTOMY N/A 04/21/2014   Procedure: APPENDECTOMY LAPAROSCOPIC;  Surgeon: Rolm Bookbinder, MD;  Location: [facility];  Service: General;  Laterality: N/A;   LAPAROSCOPIC APPENDECTOMY N/A 06/24/2014   Procedure: APPENDECTOMY LAPAROSCOPIC ;  Surgeon: Rolm Bookbinder, MD;  Location: [facility];  Service: General;  Laterality: N/A;   LUMBAR LAMINECTOMY  2001   lumbar   LYMPH NODE DISSECTION Right 08/05/2021    Procedure: LYMPH NODE DISSECTION;  Surgeon: Melrose Nakayama, MD;  Location: [facility];  Service: Thoracic;  Laterality: Right;   TONSILLECTOMY     XI ROBOTIC ASSISTED THORACOSCOPY- SEGMENTECTOMY Right 08/05/2021   Procedure: XI ROBOTIC ASSISTED THORACOSCOPY- RIGHT UPPER LOBE POSTERIOR SEGMENTECTOMY, possible lobectomy;  Surgeon: Melrose Nakayama, MD;  Location: [facility];  Service: Thoracic;  Laterality: Right;    REVIEW OF SYSTEMS:  A comprehensive review of systems was negative.   PHYSICAL EXAMINATION: General appearance: alert, cooperative, fatigued, and no distress Head: Normocephalic, without obvious abnormality, atraumatic Neck: no adenopathy, no JVD, supple, symmetrical, trachea midline, and thyroid not enlarged, symmetric, no tenderness/mass/nodules Lymph nodes: Cervical, supraclavicular, and axillary nodes normal. Resp: clear to auscultation bilaterally Back: symmetric, no curvature. ROM normal. No CVA tenderness. Cardio: regular rate and rhythm, S1, S2 normal, no murmur, click, rub or gallop GI: soft, non-tender; bowel sounds normal; no masses,  no organomegaly Extremities: extremities normal, atraumatic, no cyanosis or edema  ECOG PERFORMANCE STATUS: 1 - Symptomatic but completely ambulatory  Blood pressure 131/83, pulse 68, temperature 98.1 F (36.7 C), temperature source Oral, resp. rate 16, weight 234 lb 4.8 oz (106.3 kg), SpO2 98 %.  LABORATORY DATA: Lab Results  Component Value Date   WBC 6.7 11/07/2022   HGB 14.8 11/07/2022   HCT 42.8 11/07/2022   MCV 92.0 11/07/2022   PLT 194 11/07/2022      Chemistry      Component Value Date/Time   NA 140 11/07/2022 1409   NA 139 08/20/2020 0950   K 4.1 11/07/2022 1409   CL 108 11/07/2022 1409   CO2 26 11/07/2022 1409   BUN 24 (H) 11/07/2022 1409   BUN 36 (H) 08/20/2020 0950   CREATININE 0.97 11/07/2022 1409   CREATININE 1.23 (H) 04/21/2020 0843   GLU 133 05/07/2017 0000      Component Value Date/Time   CALCIUM  10.1 11/07/2022 1409   ALKPHOS 63 11/07/2022 1409   AST 28 11/07/2022 1409   ALT 36 11/07/2022 1409   BILITOT 0.5 11/07/2022 1409       RADIOGRAPHIC STUDIES: CT Chest W Contrast  Result Date: 11/08/2022 CLINICAL DATA:  Non-small cell lung cancer * Tracking Code: BO *. EXAM: CT CHEST WITH CONTRAST TECHNIQUE: Multidetector CT imaging of the chest was performed during intravenous contrast administration. RADIATION DOSE REDUCTION: This exam was performed according to the departmental dose-optimization program which includes automated exposure control, adjustment of the mA and/or kV according to patient size and/or use of iterative reconstruction technique. CONTRAST:  77mL OMNIPAQUE IOHEXOL 300 MG/ML  SOLN COMPARISON:  04/14/2022. FINDINGS: Cardiovascular: Atherosclerotic calcification of the aorta. Heart is at the upper limits of normal in size to mildly enlarged. No pericardial effusion. Ascending aorta measures up to 4.2  cm (6/69). Mediastinum/Nodes: No pathologically enlarged mediastinal hilar or axillary lymph nodes. Esophagus is grossly unremarkable. Lungs/Pleura: Centrilobular and paraseptal emphysema. Postoperative scarring in the posterior right upper lobe. Mild subpleural coarsened ground-glass and reticulation bilaterally, without a definite zonal predominance. 4 mm subpleural lymph node along the left major fissure, unchanged. Calcified granulomas. Pleural fluid. Airway is unremarkable. Upper Abdomen: Liver is decreased in attenuation diffusely. Stones layer in the gallbladder. Adrenal glands are unremarkable. Partially imaged exophytic low-attenuation lesion off the right kidney. No specific follow-up necessary. Visualized portions of the kidneys, spleen, pancreas, stomach and bowel are grossly unremarkable. No upper abdominal adenopathy. Musculoskeletal: Degenerative changes in the spine. No worrisome lytic or sclerotic lesions. IMPRESSION: 1. No evidence of recurrent or metastatic disease. 2.  Mild subpleural coarsened ground-glass and subpleural reticular densities without a definite zonal predominance. Findings may be due to nonspecific interstitial pneumonitis. 3. 4.2 cm ascending aortic aneurysm. Recommend annual imaging followup by CTA or MRA. This recommendation follows 2010 ACCF/AHA/AATS/ACR/ASA/SCA/SCAI/SIR/STS/SVM Guidelines for the Diagnosis and Management of Patients with Thoracic Aortic Disease. Circulation. 2010; 121ML:4928372. Aortic aneurysm NOS (ICD10-I71.9). 4. Hepatic steatosis. 5. Cholelithiasis. 6.  Aortic atherosclerosis (ICD10-I70.0). 7.  Emphysema (ICD10-J43.9). Electronically Signed   By: Lorin Picket M.D.   On: 11/08/2022 10:59    ASSESSMENT AND PLAN: This is a very pleasant 77 years old white male with Stage IA (T1c, N0, M0) non-small cell lung cancer, adenocarcinoma presented with right upper lobe lung nodule diagnosed in December 2022 status post right upper lobe posterior segmentectomy with lymph node dissection under the care of Dr. Roxan Hockey on August 05, 2021. The patient is currently on observation and he is feeling fine with no concerning complaints. He had repeat CT scan of the chest performed recently.  I personally and independently reviewed the scan and discussed the result with the patient today. His scan showed no concerning findings for disease recurrence or metastasis. I recommended for him to continue on observation with repeat CT scan of the chest in 1 year. The patient was advised to call immediately if he has any other concerning symptoms in the interval. The patient voices understanding of current disease status and treatment options and is in agreement with the current care plan.  All questions were answered. The patient knows to call the clinic with any problems, questions or concerns. We can certainly see the patient much sooner if necessary.  The total time spent in the appointment was 20 minutes.  Disclaimer: This note was dictated  with voice recognition software. Similar sounding words can inadvertently be transcribed and may not be corrected upon review.

## 2022-11-10 ENCOUNTER — Telehealth: Payer: Self-pay | Admitting: Internal Medicine

## 2022-11-10 NOTE — Telephone Encounter (Signed)
Spoke with patient confirming next years appointments  

## 2022-11-21 ENCOUNTER — Other Ambulatory Visit: Payer: Self-pay | Admitting: Family Medicine

## 2022-11-21 DIAGNOSIS — E782 Mixed hyperlipidemia: Secondary | ICD-10-CM

## 2022-11-21 DIAGNOSIS — E119 Type 2 diabetes mellitus without complications: Secondary | ICD-10-CM

## 2022-11-21 DIAGNOSIS — I10 Essential (primary) hypertension: Secondary | ICD-10-CM

## 2022-11-21 DIAGNOSIS — I25700 Atherosclerosis of coronary artery bypass graft(s), unspecified, with unstable angina pectoris: Secondary | ICD-10-CM

## 2022-12-04 NOTE — Patient Instructions (Incomplete)
It was great to see you again today Recommend COVID booster if not done in the last 9 months or so We froze the small lesion of your left forehead today- if this does not come off with the freezing please have your derm take a look at it   Try taking claritin or zyrtec daily for a month or so and see if it helps with your cough   Assuming all is well please plan to see me in about 6 months  Continue regular follow-up with urology, oncology, CT surgery, and derm

## 2022-12-04 NOTE — Progress Notes (Unsigned)
[facility] [address] [place], [place] [address] [phone]  Date:  12/07/2022   Name:  Perry [name].   DOB:  [birth year]   MRN:  [id number]  PCP:  Pearline Cables, MD    Chief Complaint: No chief complaint on file.   History of Present Illness:  Perry [name]. is a 77 y.o. 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  We got a lumbar MRI in August and he was seen by neurosurgery.  However, by the time he was seen by neurosurgery he was feeling better-they elected for observation at that time  He is followed by urology-most recent visit in October  Note from Dr. Arbutus Ped, oncology 11/09/2022: ASSESSMENT AND PLAN: This is a very pleasant 77 years old white male with Stage IA (T1c, N0, M0) non-small cell lung cancer, adenocarcinoma presented with right upper lobe lung nodule diagnosed in December 2022 status post right upper lobe posterior segmentectomy with lymph node dissection under the care of Dr. Dorris Fetch on August 05, 2021. The patient is currently on observation and he is feeling fine with no concerning complaints. He had repeat CT scan of the chest performed recently.  I personally and independently reviewed the scan and discussed the result with the patient today. His scan showed no concerning findings for disease recurrence or metastasis. I recommended for him to continue on observation with repeat CT scan of the chest in 1 year. The patient  was advised to call immediately if he has any other concerning symptoms in the interval.  He also continues to follow-up with Dr. Dorris Fetch, CT surgery-follow-up remote lung cancer and thoracic aneurysm standpoint  CT chest with contrast on chart 11/07/2022-he has an MR angio chest with contrast scheduled for this fall IMPRESSION: 1. No evidence of recurrent or metastatic disease. 2. Mild subpleural coarsened ground-glass and subpleural reticular densities without a definite zonal predominance. Findings may be due to nonspecific interstitial pneumonitis. 3. 4.2 cm ascending aortic aneurysm. Recommend annual imaging followup by CTA or MRA. This recommendation follows 2010 ACCF/AHA/AATS/ACR/ASA/SCA/SCAI/SIR/STS/SVM Guidelines for the Diagnosis and Management of Patients with Thoracic Aortic Disease. Circulation. 2010; 121: E952-W413. Aortic aneurysm NOS (ICD10-I71.9). 4. Hepatic steatosis. 5. Cholelithiasis. 6.  Aortic atherosclerosis (ICD10-I70.0). 7.  Emphysema (ICD10-J43.9).  Lab Results  Component Value Date   HGBA1C 7.6 (H) 03/08/2022   Due for A1c, urine micro, foot exam Lab Results  Component Value Date   PSA 3.24 03/08/2022   PSA 4.14 (H) 05/30/2021   PSA 2.86 04/21/2020      Patient Active Problem List   Diagnosis Date Noted   Adenocarcinoma of right lung, stage 1 (HCC) 08/23/2021   Lung nodule 08/05/2021   S/P Robotic Assisted Right VATS with Posterior Segmentectomy of RLL 08/05/2021   Pulmonary nodule 06/09/2021   History of epistaxis 12/01/2019   Intolerance of continuous positive airway pressure (CPAP) ventilation 12/01/2019   Hypersomnia with sleep apnea 12/01/2019   Other insomnia 12/01/2019   Thoracic aortic aneurysm (  HCC) 05/29/2018   Bradycardia 04/16/2017   Acute appendicitis with perforation and peritoneal abscess 05/04/2014   Abscess of abdominal cavity (HCC) 05/04/2014   Atrial fibrillation (HCC) 04/28/2014   Hyperlipidemia    Sleep apnea    CAD  (coronary artery disease)    Hearing loss    Diabetes mellitus (HCC)     Past Medical History:  Diagnosis Date   A-fib (HCC) 04/07/2014   Abscess of abdominal cavity ([facility]CC) 05/04/2014   Acute appendicitis with perforation and peritoneal abscess 05/04/2014   Allergy    Anginal pain (HCC)    CAD (coronary artery disease)    CAD (coronary artery disease) of artery bypass graft 05/04/2014   Cancer (HCC) 07/2021   lung   CHF (congestive heart failure) (HCC)    pt not aware   Diabetes mellitus    not on medication - pt not aware of this   Dyslipidemia 05/04/2014   Dysrhythmia    Hearing loss    mild   Hernia, inguinal    right   Hyperglycemia    Hypertension    Ileus, postoperative (HCC) 05/04/2014   Lumbar disc disease    Old inferior wall myocardial infarction 08/07/2002    Past Surgical History:  Procedure Laterality Date   COLONOSCOPY     CORONARY ANGIOPLASTY     multiple   CORONARY ARTERY BYPASS GRAFT  2004   LIMA to LAD and SVG to RCA (emergent)   INTERCOSTAL NERVE BLOCK Right 08/05/2021   Procedure: INTERCOSTAL NERVE BLOCK;  Surgeon: Loreli Slot, MD;  Location: [facility] OR;  Service: Thoracic;  Laterality: Right;   LAPAROSCOPIC APPENDECTOMY N/A 04/21/2014   Procedure: APPENDECTOMY LAPAROSCOPIC;  Surgeon: Emelia Loron, MD;  Location: [facility] OR;  Service: General;  Laterality: N/A;   LAPAROSCOPIC APPENDECTOMY N/A 06/24/2014   Procedure: APPENDECTOMY LAPAROSCOPIC ;  Surgeon: Emelia Loron, MD;  Location[facility];  Service: General;  Laterality: N/A;   LUMBAR LAMINECTOMY  2001   lumbar   LYMPH NODE DISSECTION Right 08/05/2021   Procedure: LYMPH NODE DISSECTION;  Surgeon: Loreli Slot, MD;  Location: [facility] OR;  Service: Thoracic;  Laterality: Right;   TONSILLECTOMY     XI ROBOTIC ASSISTED THORACOSCOPY- SEGMENTECTOMY Right 08/05/2021   Procedure: XI ROBOTIC ASSISTED THORACOSCOPY- RIGHT UPPER LOBE POSTERIOR SEGMENTECTOMY, possible lobectomy;  Surgeon:  Loreli Slot, MD;  Location: [facility] OR;  Service: Thoracic;  Laterality: Right;    Social History   Tobacco Use   Smoking status: Former    Types: Cigarettes    Quit date: 06/24/1979    Years since quitting: 43.4   Smokeless tobacco: Former    Types: Chew  Vaping Use   Vaping Use: Never used  Substance Use Topics   Alcohol use: Yes    Alcohol/week: 14.0 standard drinks of alcohol    Types: 14 Cans of beer per week    Comment: 2 drinks a day   Drug use: No    Family History  Problem Relation Age of Onset   Heart disease Father    Heart disease Brother    Hypertension Brother    Cancer Sister    Heart disease Paternal Grandmother     No Known Allergies  Medication list has been reviewed and updated.  Current Outpatient Medications on File Prior to Visit  Medication Sig Dispense Refill   aspirin (HM ASPIRIN) 81 MG chewable tablet Chew 1 tablet (81 mg total) by mouth daily. (Patient taking differently: Chew 81 mg by mouth  daily. EC) 90 tablet 3   BELSOMRA 15 MG TABS TAKE 1 TABLET BY MOUTH AT BEDTIME AS NEEDED 30 tablet 5   fenofibrate (TRICOR) 145 MG tablet Take 1 tablet (145 mg total) by mouth daily. 90 tablet 0   fish oil-omega-3 fatty acids 1000 MG capsule Take 2 g by mouth daily.     INVOKANA 100 MG TABS tablet TAKE 1 TABLET BY MOUTH EVERY DAY BEFORE BREAKFAST 90 tablet 1   lisinopril (ZESTRIL) 40 MG tablet Take 1 tablet (40 mg total) by mouth daily. 90 tablet 0   metFORMIN (GLUCOPHAGE-XR) 500 MG 24 hr tablet Take 2 tablets (1,000 mg total) by mouth every morning. 180 tablet 0   metoprolol succinate (TOPROL-XL) 50 MG 24 hr tablet TAKE 1 TABLET BY MOUTH DAILY 90 tablet 1   Multiple Vitamin (MULTIVITAMIN ADULT PO) Take 1 tablet by mouth daily.     nitroGLYCERIN (NITROSTAT) 0.4 MG SL tablet Place 1 tablet (0.4 mg total) under the tongue every 5 (five) minutes as needed for chest pain. 25 tablet 5   rosuvastatin (CRESTOR) 10 MG tablet TAKE 1 TABLET BY MOUTH DAILY 90  tablet 1   No current facility-administered medications on file prior to visit.    Review of Systems:  As per HPI- otherwise negative.   Physical Examination: There were no vitals filed for this visit. There were no vitals filed for this visit. There is no height or weight on file to calculate BMI. Ideal Body Weight:    GEN: no acute distress. HEENT: Atraumatic, Normocephalic.  Ears and Nose: No external deformity. CV: RRR, No M/G/R. No JVD. No thrill. No extra heart sounds. PULM: CTA B, no wheezes, crackles, rhonchi. No retractions. No resp. distress. No accessory muscle use. ABD: S, NT, ND, +BS. No rebound. No HSM. EXTR: No c/c/e PSYCH: Normally interactive. Conversant.  Foot exam  Assessment and Plan: *** Physical exam today.  Encouraged healthy diet and exercise routine Signed Abbe Amsterdam, MD

## 2022-12-06 DIAGNOSIS — M65351 Trigger finger, right little finger: Secondary | ICD-10-CM | POA: Diagnosis not present

## 2022-12-07 ENCOUNTER — Encounter: Payer: Self-pay | Admitting: Family Medicine

## 2022-12-07 ENCOUNTER — Ambulatory Visit (INDEPENDENT_AMBULATORY_CARE_PROVIDER_SITE_OTHER): Payer: Medicare Other | Admitting: Family Medicine

## 2022-12-07 VITALS — BP 122/60 | HR 55 | Temp 97.7°F | Resp 18 | Ht 71.0 in | Wt 230.6 lb

## 2022-12-07 DIAGNOSIS — I10 Essential (primary) hypertension: Secondary | ICD-10-CM

## 2022-12-07 DIAGNOSIS — E119 Type 2 diabetes mellitus without complications: Secondary | ICD-10-CM

## 2022-12-07 DIAGNOSIS — I25700 Atherosclerosis of coronary artery bypass graft(s), unspecified, with unstable angina pectoris: Secondary | ICD-10-CM | POA: Diagnosis not present

## 2022-12-07 DIAGNOSIS — F5101 Primary insomnia: Secondary | ICD-10-CM

## 2022-12-07 DIAGNOSIS — L821 Other seborrheic keratosis: Secondary | ICD-10-CM

## 2022-12-07 DIAGNOSIS — E782 Mixed hyperlipidemia: Secondary | ICD-10-CM | POA: Diagnosis not present

## 2022-12-07 LAB — LIPID PANEL
Cholesterol: 107 mg/dL (ref 0–200)
HDL: 38.1 mg/dL — ABNORMAL LOW (ref >39.00)
LDL Cholesterol: 55 mg/dL (ref 0–99)
NonHDL: 69.1
Total CHOL/HDL Ratio: 3
Triglycerides: 71 mg/dL (ref 0.0–149.0)
VLDL: 14.2 mg/dL (ref 0.0–40.0)

## 2022-12-07 LAB — MICROALBUMIN / CREATININE URINE RATIO
Creatinine,U: 118.6 mg/dL
Microalb Creat Ratio: 1.8 mg/g (ref 0.0–30.0)
Microalb, Ur: 2.1 mg/dL — ABNORMAL HIGH (ref 0.0–1.9)

## 2022-12-07 LAB — HEMOGLOBIN A1C: Hgb A1c MFr Bld: 7.6 % — ABNORMAL HIGH (ref 4.6–6.5)

## 2022-12-07 MED ORDER — BELSOMRA 15 MG PO TABS
15.0000 mg | ORAL_TABLET | Freq: Every evening | ORAL | 3 refills | Status: AC | PRN
Start: 2022-12-07 — End: ?

## 2023-01-09 ENCOUNTER — Other Ambulatory Visit: Payer: Self-pay

## 2023-01-09 DIAGNOSIS — E119 Type 2 diabetes mellitus without complications: Secondary | ICD-10-CM

## 2023-01-09 MED ORDER — CANAGLIFLOZIN 100 MG PO TABS
ORAL_TABLET | ORAL | 1 refills | Status: DC
Start: 2023-01-09 — End: 2023-08-07

## 2023-01-10 ENCOUNTER — Telehealth: Payer: Self-pay | Admitting: *Deleted

## 2023-01-10 MED ORDER — BELSOMRA 15 MG PO TABS: 1.0000 | ORAL_TABLET | Freq: Every evening | ORAL | 1 refills | Status: DC | PRN

## 2023-01-10 NOTE — Telephone Encounter (Signed)
[facility] called and stated that pt would like a refill on his Belsomra 15mg .    Send into [facility] pharmacy.

## 2023-01-31 NOTE — Progress Notes (Deleted)
  Cardiology Office Note:  .   Date:  01/31/2023  ID:  Perry [name]., DOB 14-Mar-1946, MRN [id number] PCP: Pearline Cables, MD  [facility] Providers Cardiologist:  Lesleigh Noe, MD (Inactive) { Click to update primary MD,subspecialty MD or APP then REFRESH:1}   History of Present Illness: .   Perry [name]. is a 77 y.o. male  with a hx of asthma, hyperlipidemia, PAF, coronary artery disease, status post coronary artery bypass graft 2004, thoracic aortic aneurysm, hypertension, diabetes mellitus type II, obstructive sleep apnea, and dyslipidemia.  Patient last saw Dr. Katrinka Blazing 05/2022 and echo showed mod dilated ascending aorta 46 mm. He offered jardiance or farxiga for patients dyspnea but patient declined.    ROS: ***  Studies Reviewed: Marland Kitchen    Prior CV Studies: {Select studies to display:26339}  Echo 05/2022 IMPRESSIONS     1. Left ventricular ejection fraction by 3D volume is 50 %. The left  ventricle has low normal function. The left ventricle has no regional wall  motion abnormalities. Left ventricular diastolic parameters are consistent  with Grade I diastolic dysfunction   (impaired relaxation).   2. Right ventricular systolic function is normal. The right ventricular  size is moderately enlarged.   3. Right atrial size was mildly dilated.   4. The mitral valve is normal in structure. No evidence of mitral valve  regurgitation. No evidence of mitral stenosis.   5. The aortic valve is tricuspid. Aortic valve regurgitation is mild. No  aortic stenosis is present.   6. Aortic dilatation noted. There is moderate dilatation of the ascending  aorta, measuring 46 mm.   7. The inferior vena cava is normal in size with greater than 50%  respiratory variability, suggesting right atrial pressure of 3 mmHg.  Risk Assessment/Calculations:   {Does this patient have ATRIAL FIBRILLATION?:[id number]} No BP recorded.  {Refresh Note OR Click here to enter BP  :1}***        Physical Exam:   VS:  There were no vitals taken for this visit.   Wt Readings from Last 3 Encounters:  12/07/22 230 lb 9.6 oz (104.6 kg)  11/09/22 234 lb 4.8 oz (106.3 kg)  05/22/22 235 lb 9.6 oz (106.9 kg)    GEN: Well nourished, well developed in no acute distress NECK: No JVD; No carotid bruits CARDIAC: ***RRR, no murmurs, rubs, gallops RESPIRATORY:  Clear to auscultation without rales, wheezing or rhonchi  ABDOMEN: Soft, non-tender, non-distended EXTREMITIES:  No edema; No deformity   ASSESSMENT AND PLAN: .     Ascending aortic aneurysm 4.6 cm 05/2022 echo  CAD s/p CABG o2004  DOE normal LVEF on echo 05/2022 grade 1DD  PAF  HTN  DM2  HLD  OSA-doesn't tolerate CPAP     {Are you ordering a CV Procedure (e.g. stress test, cath, DCCV, TEE, etc)?   Press F2        :[id number]}  Dispo: ***  Signed, Jacolyn Reedy, PA-C

## 2023-02-13 ENCOUNTER — Ambulatory Visit: Payer: Medicare Other | Admitting: Physician Assistant

## 2023-02-19 ENCOUNTER — Encounter: Payer: Self-pay | Admitting: Physician Assistant

## 2023-02-19 ENCOUNTER — Ambulatory Visit: Payer: Medicare Other | Attending: Physician Assistant | Admitting: Physician Assistant

## 2023-02-19 VITALS — BP 122/70 | HR 54 | Ht 72.0 in | Wt 234.6 lb

## 2023-02-19 DIAGNOSIS — R0609 Other forms of dyspnea: Secondary | ICD-10-CM | POA: Diagnosis not present

## 2023-02-19 DIAGNOSIS — E1159 Type 2 diabetes mellitus with other circulatory complications: Secondary | ICD-10-CM | POA: Insufficient documentation

## 2023-02-19 DIAGNOSIS — Z7984 Long term (current) use of oral hypoglycemic drugs: Secondary | ICD-10-CM | POA: Diagnosis not present

## 2023-02-19 DIAGNOSIS — I480 Paroxysmal atrial fibrillation: Secondary | ICD-10-CM | POA: Insufficient documentation

## 2023-02-19 DIAGNOSIS — G4733 Obstructive sleep apnea (adult) (pediatric): Secondary | ICD-10-CM | POA: Insufficient documentation

## 2023-02-19 DIAGNOSIS — R0989 Other specified symptoms and signs involving the circulatory and respiratory systems: Secondary | ICD-10-CM | POA: Diagnosis not present

## 2023-02-19 DIAGNOSIS — I10 Essential (primary) hypertension: Secondary | ICD-10-CM | POA: Diagnosis not present

## 2023-02-19 DIAGNOSIS — I7121 Aneurysm of the ascending aorta, without rupture: Secondary | ICD-10-CM | POA: Diagnosis not present

## 2023-02-19 DIAGNOSIS — I25810 Atherosclerosis of coronary artery bypass graft(s) without angina pectoris: Secondary | ICD-10-CM | POA: Diagnosis not present

## 2023-02-19 NOTE — Progress Notes (Signed)
Cardiology Office Note:  .   Date:  02/19/2023  ID:  Perry [name]., DOB 12-24-1945, MRN [id number] PCP: Pearline Cables, MD  [facility] Providers Cardiologist:  Donato Schultz, MD    History of Present Illness: .   Perry [name]. is a 77 y.o. male   with a hx of asthma, hyperlipidemia, PAF, coronary artery disease, status post coronary artery bypass graft 2004, thoracic aortic aneurysm, hypertension, diabetes mellitus type II, obstructive sleep apnea, and dyslipidemia.  Patient has chronic DOE but thinks it's better than the past. Denies chest pain, dizziness, edema, presyncope. He can carry 50 lbs up a hill and gets a little winded but recovers quickly. 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   ROS:    Studies Reviewed: Marland Kitchen         Prior CV Studies:   Echo 05/2022 IMPRESSIONS     1. Left ventricular ejection fraction by 3D volume is 50 %. The left  ventricle has low normal function. The left ventricle has no regional wall  motion abnormalities. Left ventricular diastolic parameters are consistent  with Grade I diastolic dysfunction   (impaired relaxation).   2. Right ventricular systolic function is normal. The right ventricular  size is moderately enlarged.   3. Right atrial size was mildly dilated.   4. The mitral valve is normal in structure. No evidence of mitral valve  regurgitation. No evidence of mitral stenosis.   5. The aortic valve is tricuspid. Aortic valve regurgitation is mild. No  aortic stenosis is present.   6. Aortic dilatation noted. There is moderate dilatation of the ascending  aorta, measuring 46 mm.   7. The inferior vena cava is normal in size with greater than 50%  respiratory variability, suggesting right atrial pressure of 3 mmHg.   CT chest 11/2022 MPRESSION: 1. No evidence of recurrent or metastatic disease. 2. Mild subpleural coarsened ground-glass and subpleural reticular densities without a definite zonal predominance. Findings may  be due to nonspecific interstitial pneumonitis. 3. 4.2 cm ascending aortic aneurysm. Recommend annual imaging followup by CTA or MRA. This recommendation follows 2010 ACCF/AHA/AATS/ACR/ASA/SCA/SCAI/SIR/STS/SVM Guidelines for the Diagnosis and Management of Patients with Thoracic Aortic Disease. Circulation. 2010; 121: S063-K160. Aortic aneurysm NOS (ICD10-I71.9). 4. Hepatic steatosis. 5. Cholelithiasis. 6.  Aortic atherosclerosis (ICD10-I70.0). 7.  Emphysema (ICD10-J43.9).     Risk Assessment/Calculations:             Physical Exam:   VS:  BP 122/70   Pulse (!) 54   Ht 6' (1.829 m)   Wt 234 lb 9.6 oz (106.4 kg)   SpO2 96%   BMI 31.82 kg/m    Wt Readings from Last 3 Encounters:  02/19/23 234 lb 9.6 oz (106.4 kg)  12/07/22 230 lb 9.6 oz (104.6 kg)  11/09/22 234 lb 4.8 oz (106.3 kg)    GEN: Obese, in no acute distress NECK: No JVD; No carotid bruits CARDIAC:  RRR, no murmurs, rubs, gallops RESPIRATORY:  Clear to auscultation without rales, wheezing or rhonchi  ABDOMEN: Soft, non-tender, non-distended EXTREMITIES:  No edema; No deformity   ASSESSMENT AND PLAN: .    Ascending aortic aneurysm 4.2 cm on chest CT 11/07/22 for MRI 04/2023  CAD CABG 2004. No angina, continue ASA and metoprolol and lisinopril, crestor  DOE chronic but has improved over the years.  PAF during appendix surgery and no recurrence.  HTN controlled  OSA doesn't tolerate Cpap   DM2 -A1C 7.6 10/2022. He's working on  his diet and trying to lose weight. Discussed importance of good BP control.  Carotid stenosis dopplers stable 1-39% 05/2022 scheduled for repeat and f/u with Dr. Anne Fu to get established       Dispo: Dr. Anne Fu as scheduled.  Signed, Jacolyn Reedy, PA-C

## 2023-02-19 NOTE — Patient Instructions (Signed)
Medication Instructions:  Your physician recommends that you continue on your current medications as directed. Please refer to the Current Medication list given to you today.  *If you need a refill on your cardiac medications before your next appointment, please call your pharmacy*   Lab Work: NONE   If you have labs (blood work) drawn today and your tests are completely normal, you will receive your results only by: MyChart Message (if you have MyChart) OR A paper copy in the mail If you have any lab test that is abnormal or we need to change your treatment, we will call you to review the results.   Testing/Procedures: NONE    Follow-Up: At [facility], you and your health needs are our priority.  As part of our continuing mission to provide you with exceptional heart care, we have created designated Provider Care Teams.  These Care Teams include your primary Cardiologist (physician) and Advanced Practice Providers (APPs -  Physician Assistants and Nurse Practitioners) who all work together to provide you with the care you need, when you need it.  We recommend signing up for the patient portal called "MyChart".  Sign up information is provided on this After Visit Summary.  MyChart is used to connect with patients for Virtual Visits (Telemedicine).  Patients are able to view lab/test results, encounter notes, upcoming appointments, etc.  Non-urgent messages can be sent to your provider as well.   To learn more about what you can do with MyChart, go to [email or web address].    Your next appointment:    As scheduled   Provider:   You may see Donato Schultz, MD or one of the following Advanced Practice Providers on your designated Care Team:   Randall An, PA-C  Jacolyn Reedy, PA-C     Other Instructions Thank you for choosing [facility]!

## 2023-02-20 ENCOUNTER — Other Ambulatory Visit: Payer: Self-pay | Admitting: Family Medicine

## 2023-02-20 DIAGNOSIS — E119 Type 2 diabetes mellitus without complications: Secondary | ICD-10-CM

## 2023-02-20 DIAGNOSIS — I10 Essential (primary) hypertension: Secondary | ICD-10-CM

## 2023-02-20 DIAGNOSIS — E782 Mixed hyperlipidemia: Secondary | ICD-10-CM

## 2023-02-20 DIAGNOSIS — I25700 Atherosclerosis of coronary artery bypass graft(s), unspecified, with unstable angina pectoris: Secondary | ICD-10-CM

## 2023-03-19 ENCOUNTER — Other Ambulatory Visit: Payer: Self-pay | Admitting: Family Medicine

## 2023-03-19 DIAGNOSIS — E782 Mixed hyperlipidemia: Secondary | ICD-10-CM

## 2023-03-19 DIAGNOSIS — I25700 Atherosclerosis of coronary artery bypass graft(s), unspecified, with unstable angina pectoris: Secondary | ICD-10-CM

## 2023-03-22 DIAGNOSIS — H40013 Open angle with borderline findings, low risk, bilateral: Secondary | ICD-10-CM | POA: Diagnosis not present

## 2023-03-22 DIAGNOSIS — H26493 Other secondary cataract, bilateral: Secondary | ICD-10-CM | POA: Diagnosis not present

## 2023-03-22 LAB — HM DIABETES EYE EXAM

## 2023-04-02 ENCOUNTER — Ambulatory Visit (INDEPENDENT_AMBULATORY_CARE_PROVIDER_SITE_OTHER): Payer: Medicare Other

## 2023-04-02 ENCOUNTER — Encounter (HOSPITAL_COMMUNITY): Payer: Self-pay

## 2023-04-02 ENCOUNTER — Ambulatory Visit (HOSPITAL_COMMUNITY)
Admission: EM | Admit: 2023-04-02 | Discharge: 2023-04-02 | Disposition: A | Discharge status: Home / Self Care | Payer: Medicare Other | Attending: Internal Medicine | Admitting: Internal Medicine

## 2023-04-02 DIAGNOSIS — J209 Acute bronchitis, unspecified: Secondary | ICD-10-CM | POA: Diagnosis not present

## 2023-04-02 DIAGNOSIS — C3490 Malignant neoplasm of unspecified part of unspecified bronchus or lung: Secondary | ICD-10-CM | POA: Diagnosis not present

## 2023-04-02 DIAGNOSIS — R059 Cough, unspecified: Secondary | ICD-10-CM | POA: Diagnosis not present

## 2023-04-02 DIAGNOSIS — R0602 Shortness of breath: Secondary | ICD-10-CM | POA: Diagnosis not present

## 2023-04-02 MED ORDER — PREDNISONE 20 MG PO TABS: 40.0000 mg | ORAL_TABLET | Freq: Every day | ORAL | 0 refills | Status: AC

## 2023-04-02 MED ORDER — AZITHROMYCIN 250 MG PO TABS: 250.0000 mg | ORAL_TABLET | Freq: Every day | ORAL | 0 refills | Status: DC

## 2023-04-02 MED ORDER — BENZONATATE 200 MG PO CAPS: 200.0000 mg | ORAL_CAPSULE | Freq: Three times a day (TID) | ORAL | 0 refills | Status: DC | PRN

## 2023-04-02 NOTE — ED Triage Notes (Signed)
Patient here today with c/o cough X 2 weeks. Patient took some covid tests at home last week and they were negative. H/o lung cancer surgery a few years ago. He took Excedrin last night with no relief. No sick contacts. No recent travel prior to sickness. Went to [employer] last night. Cough has been worsening.

## 2023-04-02 NOTE — Discharge Instructions (Addendum)
Please maintain adequate hydration Your chest x-ray is showing a patch of infection on the right side of your lung Please take medications as recommended We will call you with recommendations once the final x-ray results are available Return to urgent care if you have any other concerns.

## 2023-04-02 NOTE — ED Provider Notes (Signed)
[facility]    CSN: [id number] Arrival date & time: 04/02/23  0815      History   Chief Complaint Chief Complaint  Patient presents with   Cough    HPI Perry [name]. is a 77 y.o. male comes to the urgent care with 2-week history of cough.  Patient endorses runny nose about 2 weeks ago.  Patient subsequently developed a cough which is minimally productive of sputum.  He denies any fevers but endorses chills last night.  Cough has worsened over the past couple of weeks and is associated with some shortness of breath.  No wheezing.  Patient denies any chest tightness.  He has been taking over-the-counter remedies with no improvement in his symptoms.  Patient has a history of lung cancer status post lobectomy.  No recent history of tobacco use.  No sick contacts.  HPI  Past Medical History:  Diagnosis Date   A-fib (HCC) 04/07/2014   Abscess of abdominal cavity (HCC) 05/04/2014   Acute appendicitis with perforation and peritoneal abscess 05/04/2014   Allergy    Anginal pain (HCC)    CAD (coronary artery disease)    CAD (coronary artery disease) of artery bypass graft 05/04/2014   Cancer (HCC) 07/2021   lung   CHF (congestive heart failure) (HCC)    pt not aware   Diabetes mellitus    not on medication - pt not aware of this   Dyslipidemia 05/04/2014   Dysrhythmia    Hearing loss    mild   Hernia, inguinal    right   Hyperglycemia    Hypertension    Ileus, postoperative (HCC) 05/04/2014   Lumbar disc disease    Old inferior wall myocardial infarction 08/07/2002    Patient Active Problem List   Diagnosis Date Noted   Adenocarcinoma of right lung, stage 1 (HCC) 08/23/2021   Lung nodule 08/05/2021   S/P Robotic Assisted Right VATS with Posterior Segmentectomy of RLL 08/05/2021   Pulmonary nodule 06/09/2021   History of epistaxis 12/01/2019   Intolerance of continuous positive airway pressure (CPAP) ventilation 12/01/2019   Hypersomnia with sleep apnea  12/01/2019   Other insomnia 12/01/2019   Thoracic aortic aneurysm (HCC) 05/29/2018   Bradycardia 04/16/2017   Acute appendicitis with perforation and peritoneal abscess 05/04/2014   Abscess of abdominal cavity (HCC) 05/04/2014   Atrial fibrillation (HCC) 04/28/2014   Hyperlipidemia    Sleep apnea    CAD (coronary artery disease)    Hearing loss    Diabetes mellitus (HCC)     Past Surgical History:  Procedure Laterality Date   COLONOSCOPY     CORONARY ANGIOPLASTY     multiple   CORONARY ARTERY BYPASS GRAFT  2004   LIMA to LAD and SVG to RCA (emergent)   INTERCOSTAL NERVE BLOCK Right 08/05/2021   Procedure: INTERCOSTAL NERVE BLOCK;  Surgeon: Loreli Slot, MD;  Location: [facility] OR;  Service: Thoracic;  Laterality: Right;   LAPAROSCOPIC APPENDECTOMY N/A 04/21/2014   Procedure: APPENDECTOMY LAPAROSCOPIC;  Surgeon: Emelia Loron, MD;  Location: [facility] OR;  Service: General;  Laterality: N/A;   LAPAROSCOPIC APPENDECTOMY N/A 06/24/2014   Procedure: APPENDECTOMY LAPAROSCOPIC ;  Surgeon: Emelia Loron, MD;  Location: [facility] OR;  Service: General;  Laterality: N/A;   LUMBAR LAMINECTOMY  2001   lumbar   LYMPH NODE DISSECTION Right 08/05/2021   Procedure: LYMPH NODE DISSECTION;  Surgeon: Loreli Slot, MD;  Location: [facility] OR;  Service: Thoracic;  Laterality: Right;  TONSILLECTOMY     XI ROBOTIC ASSISTED THORACOSCOPY- SEGMENTECTOMY Right 08/05/2021   Procedure: XI ROBOTIC ASSISTED THORACOSCOPY- RIGHT UPPER LOBE POSTERIOR SEGMENTECTOMY, possible lobectomy;  Surgeon: Loreli Slot, MD;  Location: [facility] OR;  Service: Thoracic;  Laterality: Right;       Home Medications    Prior to Admission medications   Medication Sig Start Date End Date Taking? Authorizing Provider  azithromycin (ZITHROMAX) 250 MG tablet Take 1 tablet (250 mg total) by mouth daily. Take first 2 tablets together, then 1 every day until finished. 04/02/23  Yes Remy Dia, Britta Mccreedy, MD  benzonatate (TESSALON)  200 MG capsule Take 1 capsule (200 mg total) by mouth 3 (three) times daily as needed for cough. 04/02/23  Yes Lynnelle Mesmer, Britta Mccreedy, MD  predniSONE (DELTASONE) 20 MG tablet Take 2 tablets (40 mg total) by mouth daily for 5 days. 04/02/23 04/07/23 Yes Britten Parady, Britta Mccreedy, MD  aspirin (HM ASPIRIN) 81 MG chewable tablet Chew 1 tablet (81 mg total) by mouth daily. Patient taking differently: Chew 81 mg by mouth daily. EC 02/24/21   Copland, Gwenlyn Found, MD  canagliflozin (INVOKANA) 100 MG TABS tablet TAKE 1 TABLET BY MOUTH EVERY DAY BEFORE BREAKFAST 01/09/23   Copland, Gwenlyn Found, MD  fenofibrate (TRICOR) 145 MG tablet TAKE 1 TABLET BY MOUTH DAILY 02/20/23   Copland, Gwenlyn Found, MD  fish oil-omega-3 fatty acids 1000 MG capsule Take 2 g by mouth daily.    [provider]  lisinopril (ZESTRIL) 40 MG tablet TAKE 1 TABLET BY MOUTH EVERY DAY 02/20/23   Copland, Gwenlyn Found, MD  metFORMIN (GLUCOPHAGE-XR) 500 MG 24 hr tablet TAKE 2 TABLETS BY MOUTH EVERY MORNING 02/20/23   Copland, Gwenlyn Found, MD  metoprolol succinate (TOPROL-XL) 50 MG 24 hr tablet TAKE 1 TABLET BY MOUTH DAILY 03/19/23   Copland, Gwenlyn Found, MD  Multiple Vitamin (MULTIVITAMIN ADULT PO) Take 1 tablet by mouth daily.    [provider]  nitroGLYCERIN (NITROSTAT) 0.4 MG SL tablet Place 1 tablet (0.4 mg total) under the tongue every 5 (five) minutes as needed for chest pain. 12/25/19   Lyn Records, MD  rosuvastatin (CRESTOR) 10 MG tablet TAKE 1 TABLET BY MOUTH DAILY 03/19/23   Copland, Gwenlyn Found, MD  Suvorexant (BELSOMRA) 15 MG TABS Take 1 tablet (15 mg total) by mouth at bedtime as needed. 12/07/22   Copland, Gwenlyn Found, MD  Suvorexant (BELSOMRA) 15 MG TABS Take 1 tablet (15 mg total) by mouth at bedtime as needed. 01/10/23   Copland, Gwenlyn Found, MD    Family History Family History  Problem Relation Age of Onset   Heart disease Father    Heart disease Brother    Hypertension Brother    Cancer Sister    Heart disease Paternal Grandmother      Social History Social History   Tobacco Use   Smoking status: Former    Current packs/day: 0.00    Types: Cigarettes    Quit date: 06/24/1979    Years since quitting: 43.8   Smokeless tobacco: Former    Types: Engineer, drilling   Vaping status: Never Used  Substance Use Topics   Alcohol use: Yes    Alcohol/week: 14.0 standard drinks of alcohol    Types: 14 Cans of beer per week    Comment: 2 drinks a day   Drug use: No     Allergies   Patient has no known allergies.   Review of Systems Review of Systems As per  HPI  Physical Exam Triage Vital Signs ED Triage Vitals  Encounter Vitals Group     BP 04/02/23 0917 109/69     Systolic BP Percentile --      Diastolic BP Percentile --      Pulse Rate 04/02/23 0917 63     Resp 04/02/23 0917 16     Temp 04/02/23 0917 98 F (36.7 C)     Temp Source 04/02/23 0917 Oral     SpO2 04/02/23 0917 94 %     Weight 04/02/23 0917 230 lb (104.3 kg)     Height 04/02/23 0917 6\' 1"  (1.854 m)     Head Circumference --      Peak Flow --      Pain Score 04/02/23 0916 0     Pain Loc --      Pain Education --      Exclude from Growth Chart --    No data found.  Updated Vital Signs BP 109/69 (BP Location: Left Arm)   Pulse 63   Temp 98 F (36.7 C) (Oral)   Resp 16   Ht 6\' 1"  (1.854 m)   Wt 104.3 kg   SpO2 94%   BMI 30.34 kg/m   Visual Acuity Right Eye Distance:   Left Eye Distance:   Bilateral Distance:    Right Eye Near:   Left Eye Near:    Bilateral Near:     Physical Exam Vitals and nursing note reviewed.  Constitutional:      General: He is not in acute distress.    Appearance: He is ill-appearing.  Cardiovascular:     Rate and Rhythm: Normal rate and regular rhythm.     Pulses: Normal pulses.     Heart sounds: Normal heart sounds.  Pulmonary:     Effort: Pulmonary effort is normal.     Comments: Harsh breath sounds on the right side with occasional wheezing.  No crackles or rails.  No  rhonchi. Abdominal:     General: Bowel sounds are normal.     Palpations: Abdomen is soft.  Neurological:     Mental Status: He is alert.      UC Treatments / Results  Labs (all labs ordered are listed, but only abnormal results are displayed) Labs Reviewed - No data to display  EKG   Radiology DG Chest 2 View  Result Date: 04/02/2023 CLINICAL DATA:  Cough and shortness of breath for 2 weeks. History of lung cancer surgery a few years ago. EXAM: CHEST - 2 VIEW COMPARISON:  Chest radiographs 10/18/2021 and 08/11/2021 FINDINGS: Status post median sternotomy and CABG. Cardiac silhouette is again at the upper limits of normal size. Mediastinal contours are within normal limits. Surgical suture is again seen within the right upper lung. The lungs are clear. No pleural effusion or pneumothorax. Mild multilevel degenerative disc changes of the thoracic spine. IMPRESSION: No active cardiopulmonary disease. Electronically Signed   By: Neita Garnet M.D.   On: 04/02/2023 10:32    Procedures Procedures (including critical care time)  Medications Ordered in UC Medications - No data to display  Initial Impression / Assessment and Plan / UC Course  I have reviewed the triage vital signs and the nursing notes.  Pertinent labs & imaging results that were available during my care of the patient were reviewed by me and considered in my medical decision making (see chart for details).     1.  Acute bronchitis with bronchospasm: Chest x-ray is negative  for acute lung infiltrate Azithromycin-Z-Pak Prednisone 40 mg orally daily for 5 days Tessalon Perles as needed for cough Return precautions given. Final Clinical Impressions(s) / UC Diagnoses   Final diagnoses:  Acute bronchitis with bronchospasm     Discharge Instructions      Please maintain adequate hydration Your chest x-ray is showing a patch of infection on the right side of your lung Please take medications as recommended We  will call you with recommendations once the final x-ray results are available Return to urgent care if you have any other concerns.   ED Prescriptions     Medication Sig Dispense Auth. Provider   predniSONE (DELTASONE) 20 MG tablet Take 2 tablets (40 mg total) by mouth daily for 5 days. 10 tablet Rubi Tooley, Britta Mccreedy, MD   benzonatate (TESSALON) 200 MG capsule Take 1 capsule (200 mg total) by mouth 3 (three) times daily as needed for cough. 30 capsule Seleen Walter, Britta Mccreedy, MD   azithromycin (ZITHROMAX) 250 MG tablet Take 1 tablet (250 mg total) by mouth daily. Take first 2 tablets together, then 1 every day until finished. 6 tablet Eufemia Prindle, Britta Mccreedy, MD      PDMP not reviewed this encounter.   Merrilee Jansky, MD 04/02/23 1056

## 2023-04-16 DIAGNOSIS — H35373 Puckering of macula, bilateral: Secondary | ICD-10-CM | POA: Diagnosis not present

## 2023-04-16 DIAGNOSIS — H26493 Other secondary cataract, bilateral: Secondary | ICD-10-CM | POA: Diagnosis not present

## 2023-04-16 DIAGNOSIS — H26491 Other secondary cataract, right eye: Secondary | ICD-10-CM | POA: Diagnosis not present

## 2023-04-16 DIAGNOSIS — D3132 Benign neoplasm of left choroid: Secondary | ICD-10-CM | POA: Diagnosis not present

## 2023-04-16 DIAGNOSIS — H40013 Open angle with borderline findings, low risk, bilateral: Secondary | ICD-10-CM | POA: Diagnosis not present

## 2023-04-16 DIAGNOSIS — E119 Type 2 diabetes mellitus without complications: Secondary | ICD-10-CM | POA: Diagnosis not present

## 2023-04-23 ENCOUNTER — Ambulatory Visit (HOSPITAL_COMMUNITY)
Admission: RE | Admit: 2023-04-23 | Discharge: 2023-04-23 | Disposition: A | Discharge status: Home / Self Care | Payer: Medicare Other | Source: Ambulatory Visit | Attending: Interventional Cardiology | Admitting: Interventional Cardiology

## 2023-04-23 DIAGNOSIS — I7121 Aneurysm of the ascending aorta, without rupture: Secondary | ICD-10-CM | POA: Diagnosis not present

## 2023-04-23 DIAGNOSIS — I771 Stricture of artery: Secondary | ICD-10-CM | POA: Diagnosis not present

## 2023-04-23 MED ORDER — GADOBUTROL 1 MMOL/ML IV SOLN
9.0000 mL | Freq: Once | INTRAVENOUS | Status: AC | PRN
  Administered 2023-04-23: 9 mL via INTRAVENOUS

## 2023-05-03 ENCOUNTER — Ambulatory Visit (HOSPITAL_COMMUNITY)
Admission: RE | Admit: 2023-05-03 | Discharge: 2023-05-03 | Disposition: A | Discharge status: Home / Self Care | Payer: Medicare Other | Source: Ambulatory Visit | Attending: Cardiology | Admitting: Cardiology

## 2023-05-03 DIAGNOSIS — I6523 Occlusion and stenosis of bilateral carotid arteries: Secondary | ICD-10-CM | POA: Diagnosis not present

## 2023-05-14 DIAGNOSIS — D1801 Hemangioma of skin and subcutaneous tissue: Secondary | ICD-10-CM | POA: Diagnosis not present

## 2023-05-14 DIAGNOSIS — L433 Subacute (active) lichen planus: Secondary | ICD-10-CM | POA: Diagnosis not present

## 2023-05-14 DIAGNOSIS — L570 Actinic keratosis: Secondary | ICD-10-CM | POA: Diagnosis not present

## 2023-05-14 DIAGNOSIS — L821 Other seborrheic keratosis: Secondary | ICD-10-CM | POA: Diagnosis not present

## 2023-05-14 DIAGNOSIS — L918 Other hypertrophic disorders of the skin: Secondary | ICD-10-CM | POA: Diagnosis not present

## 2023-05-15 DIAGNOSIS — H26492 Other secondary cataract, left eye: Secondary | ICD-10-CM | POA: Diagnosis not present

## 2023-05-21 ENCOUNTER — Ambulatory Visit: Payer: Medicare Other | Attending: Cardiology | Admitting: Cardiology

## 2023-05-21 ENCOUNTER — Encounter: Payer: Self-pay | Admitting: Cardiology

## 2023-05-21 VITALS — BP 124/76 | HR 58 | Ht 68.0 in | Wt 210.6 lb

## 2023-05-21 DIAGNOSIS — I480 Paroxysmal atrial fibrillation: Secondary | ICD-10-CM

## 2023-05-21 DIAGNOSIS — E1159 Type 2 diabetes mellitus with other circulatory complications: Secondary | ICD-10-CM

## 2023-05-21 DIAGNOSIS — I7121 Aneurysm of the ascending aorta, without rupture: Secondary | ICD-10-CM | POA: Diagnosis not present

## 2023-05-21 DIAGNOSIS — I25810 Atherosclerosis of coronary artery bypass graft(s) without angina pectoris: Secondary | ICD-10-CM

## 2023-05-21 NOTE — Patient Instructions (Signed)
Medication Instructions:  The current medical regimen is effective;  continue present plan and medications.  *If you need a refill on your cardiac medications before your next appointment, please call your pharmacy*   Follow-Up: At [facility], you and your health needs are our priority.  As part of our continuing mission to provide you with exceptional heart care, we have created designated Provider Care Teams.  These Care Teams include your primary Cardiologist (physician) and Advanced Practice Providers (APPs -  Physician Assistants and Nurse Practitioners) who all work together to provide you with the care you need, when you need it.  We recommend signing up for the patient portal called "MyChart".  Sign up information is provided on this After Visit Summary.  MyChart is used to connect with patients for Virtual Visits (Telemedicine).  Patients are able to view lab/test results, encounter notes, upcoming appointments, etc.  Non-urgent messages can be sent to your provider as well.   To learn more about what you can do with MyChart, go to [email or web address].    Your next appointment:   6 month(s)  Provider:   Jacolyn Reedy, PA-C     Then, Donato Schultz, MD will plan to see you again in 1 year(s).

## 2023-05-21 NOTE — Progress Notes (Signed)
Cardiology Office Note:  .   Date:  05/21/2023  ID:  Yolanda Bonine., DOB 10/17/1945, MRN [id number] PCP: Pearline Cables, MD  [facility] Providers Cardiologist:  Donato Schultz, MD     History of Present Illness: .   Perry [name]. is a 77 y.o. male Discussed with the use of AI scribe   History of Present Illness   The patient is a 77 year old male with a history of coronary artery disease, post-CABG in 2004, ascending aortic aneurysm/dilatation, diabetes, hypertension, obstructive sleep apnea, and hyperlipidemia. He is here for a routine follow-up. 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 He had a brief episode of atrial fibrillation during appendix surgery with no further occurrence. He denies any anginal symptoms.  The patient is on aspirin, metoprolol, lisinopril, and Crestor. He reports chronic shortness of breath over the years, but carotid dopplers were stable. He also has a history of cancer and undergoes regular scans for monitoring.  In 2004, the patient underwent CABG, which was supposed to last ten years. He expresses some anxiety given that it's been 18 years since the surgery, but he reports feeling fine overall. A stress test done about two years ago showed overall good results, and the pump function looks good.  The patient's cholesterol levels are good, with an LDL of 55 in May 2024. His creatinine is 0.9, and his hemoglobin A1c is 7.6, indicating relatively good control of his diabetes. He reports some weight loss.  The patient's carotid arteries show very mild plaque. He has not experienced any vision changes or stroke-like symptoms.      Son in dam restoration. 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    ROS: No CP, no SOB  Studies Reviewed: Marland Kitchen   EKG Interpretation Date/Time:  Monday May 21 2023 07:48:01 EDT Ventricular Rate:  58 PR Interval:  236 QRS Duration:  112 QT  Interval:  460 QTC Calculation: 451 R Axis:   -45  Text Interpretation: Sinus bradycardia with 1st degree A-V block Left axis deviation Inferior infarct (cited on or before 17-Apr-2017) When compared with ECG of 17-Apr-2017 06:40, Premature ventricular complexes are no longer Present T wave inversion no longer evident in Inferior leads Nonspecific T wave abnormality now evident in Lateral leads Confirmed by Donato Schultz ([id number]) on 05/21/2023 8:04:45 AM    Results LABS LDL: 55 (12/2022) Creatinine: 0.9 (12/2022) Hemoglobin A1c: 7.6 (12/2022)  RADIOLOGY Carotid Doppler: Mild carotid plaque bilaterally (05/03/2023) CT Angiogram: Ascending aorta 4.1 cm (04/2022)  DIAGNOSTIC Echocardiogram: Ejection fraction 50%, Grade 1 diastolic dysfunction, Right ventricle moderately enlarged, Right atrium mildly dilated, Mild aortic regurgitation, Moderate dilation of the ascending aorta 46 mm (06/05/2022) Nuclear Stress Test: Fixed inferior perfusion defect with inferior hypokinesis consistent with infarction, Ejection fraction 51% (07/2021)  Risk Assessment/Calculations:            Physical Exam:   VS:  BP 124/76   Pulse (!) 58   Ht 5\' 8"  (1.727 m)   Wt 210 lb 9.6 oz (95.5 kg)   SpO2 96%   BMI 32.02 kg/m    Wt Readings from Last 3 Encounters:  05/21/23 210 lb 9.6 oz (95.5 kg)  04/02/23 230 lb (104.3 kg)  02/19/23 234 lb 9.6 oz (106.4 kg)    GEN: Well nourished, well developed in no acute distress NECK: No JVD; No carotid bruits CARDIAC: RRR, no murmurs, no rubs, no gallops RESPIRATORY:  Clear to auscultation without rales, wheezing or rhonchi  ABDOMEN: Soft, non-tender, non-distended EXTREMITIES:  No edema; No deformity   ASSESSMENT AND PLAN: .    Assessment and Plan    Coronary Artery Disease (CAD) Post CABG in 2004, with a fixed inferior perfusion defect on nuclear stress test in 2022 consistent with infarction. Ejection fraction of 50% on echocardiogram in 2023. Chronic shortness  of breath. -Continue aspirin, metoprolol, lisinopril, and Crestor. -6 month follow-up with PA Herma Carson.  Ascending Aortic Aneurysm/Dilatation Ascending aorta measured at 46mm on most recent echocardiogram and 41mm on CT. -Annual imaging of the aorta (alternating between CT and echocardiogram would be reasonable but he may be getting CT with oncology (Dr. Gwenyth Bouillon). -1 year follow-up with me.  Diabetes A1C of 7.6 in May 2024, indicating relatively good control. -Continue current management and lifestyle modifications. Lost weight.  Hyperlipidemia LDL of 55 in May 2024, indicating good control. -Continue Crestor.  Hypertension No specific details discussed in the conversation. -Continue lisinopril.  Obstructive Sleep Apnea No specific details discussed in the conversation. -No changes to current management.  Carotid Artery Disease Mild carotid plaque bilaterally on carotid dopplers in September 2024. -No further imaging required unless there is a change in symptoms.              Signed, Donato Schultz, MD

## 2023-06-06 DIAGNOSIS — Z23 Encounter for immunization: Secondary | ICD-10-CM | POA: Diagnosis not present

## 2023-06-29 DIAGNOSIS — M65351 Trigger finger, right little finger: Secondary | ICD-10-CM | POA: Diagnosis not present

## 2023-06-30 DIAGNOSIS — Z23 Encounter for immunization: Secondary | ICD-10-CM | POA: Diagnosis not present

## 2023-07-20 DIAGNOSIS — M65351 Trigger finger, right little finger: Secondary | ICD-10-CM | POA: Diagnosis not present

## 2023-08-07 ENCOUNTER — Other Ambulatory Visit: Payer: Self-pay | Admitting: Family Medicine

## 2023-08-07 ENCOUNTER — Encounter: Payer: Self-pay | Admitting: Family Medicine

## 2023-08-07 DIAGNOSIS — E119 Type 2 diabetes mellitus without complications: Secondary | ICD-10-CM

## 2023-08-07 DIAGNOSIS — F5101 Primary insomnia: Secondary | ICD-10-CM

## 2023-08-09 DIAGNOSIS — N400 Enlarged prostate without lower urinary tract symptoms: Secondary | ICD-10-CM | POA: Diagnosis not present

## 2023-08-31 DIAGNOSIS — E7800 Pure hypercholesterolemia, unspecified: Secondary | ICD-10-CM | POA: Diagnosis not present

## 2023-08-31 DIAGNOSIS — Z1159 Encounter for screening for other viral diseases: Secondary | ICD-10-CM | POA: Diagnosis not present

## 2023-08-31 DIAGNOSIS — I10 Essential (primary) hypertension: Secondary | ICD-10-CM | POA: Diagnosis not present

## 2023-08-31 DIAGNOSIS — Z79899 Other long term (current) drug therapy: Secondary | ICD-10-CM | POA: Diagnosis not present

## 2023-08-31 DIAGNOSIS — Z0000 Encounter for general adult medical examination without abnormal findings: Secondary | ICD-10-CM | POA: Diagnosis not present

## 2023-08-31 DIAGNOSIS — I2510 Atherosclerotic heart disease of native coronary artery without angina pectoris: Secondary | ICD-10-CM | POA: Diagnosis not present

## 2023-08-31 DIAGNOSIS — Z125 Encounter for screening for malignant neoplasm of prostate: Secondary | ICD-10-CM | POA: Diagnosis not present

## 2023-08-31 DIAGNOSIS — E1169 Type 2 diabetes mellitus with other specified complication: Secondary | ICD-10-CM | POA: Diagnosis not present

## 2023-09-16 ENCOUNTER — Other Ambulatory Visit: Payer: Self-pay | Admitting: Family Medicine

## 2023-09-16 DIAGNOSIS — I25700 Atherosclerosis of coronary artery bypass graft(s), unspecified, with unstable angina pectoris: Secondary | ICD-10-CM

## 2023-09-16 DIAGNOSIS — E782 Mixed hyperlipidemia: Secondary | ICD-10-CM

## 2023-09-24 DIAGNOSIS — E119 Type 2 diabetes mellitus without complications: Secondary | ICD-10-CM | POA: Diagnosis not present

## 2023-09-24 DIAGNOSIS — H04123 Dry eye syndrome of bilateral lacrimal glands: Secondary | ICD-10-CM | POA: Diagnosis not present

## 2023-09-24 DIAGNOSIS — H40013 Open angle with borderline findings, low risk, bilateral: Secondary | ICD-10-CM | POA: Diagnosis not present

## 2023-09-24 DIAGNOSIS — H01001 Unspecified blepharitis right upper eyelid: Secondary | ICD-10-CM | POA: Diagnosis not present

## 2023-10-09 DIAGNOSIS — K080 Exfoliation of teeth due to systemic causes: Secondary | ICD-10-CM | POA: Diagnosis not present

## 2023-10-31 DIAGNOSIS — E1169 Type 2 diabetes mellitus with other specified complication: Secondary | ICD-10-CM | POA: Diagnosis not present

## 2023-10-31 DIAGNOSIS — I2510 Atherosclerotic heart disease of native coronary artery without angina pectoris: Secondary | ICD-10-CM | POA: Diagnosis not present

## 2023-10-31 DIAGNOSIS — I10 Essential (primary) hypertension: Secondary | ICD-10-CM | POA: Diagnosis not present

## 2023-11-05 ENCOUNTER — Ambulatory Visit (HOSPITAL_COMMUNITY)
Admission: RE | Admit: 2023-11-05 | Discharge: 2023-11-05 | Disposition: A | Discharge status: Home / Self Care | Payer: Self-pay | Source: Ambulatory Visit | Attending: Internal Medicine | Admitting: Internal Medicine

## 2023-11-05 ENCOUNTER — Inpatient Hospital Stay: Payer: Self-pay | Attending: Internal Medicine

## 2023-11-05 DIAGNOSIS — E1169 Type 2 diabetes mellitus with other specified complication: Secondary | ICD-10-CM | POA: Diagnosis not present

## 2023-11-05 DIAGNOSIS — C3490 Malignant neoplasm of unspecified part of unspecified bronchus or lung: Secondary | ICD-10-CM | POA: Diagnosis not present

## 2023-11-05 DIAGNOSIS — I2510 Atherosclerotic heart disease of native coronary artery without angina pectoris: Secondary | ICD-10-CM | POA: Diagnosis not present

## 2023-11-05 DIAGNOSIS — Z85118 Personal history of other malignant neoplasm of bronchus and lung: Secondary | ICD-10-CM | POA: Insufficient documentation

## 2023-11-05 DIAGNOSIS — J929 Pleural plaque without asbestos: Secondary | ICD-10-CM | POA: Diagnosis not present

## 2023-11-05 DIAGNOSIS — I10 Essential (primary) hypertension: Secondary | ICD-10-CM | POA: Diagnosis not present

## 2023-11-05 DIAGNOSIS — J439 Emphysema, unspecified: Secondary | ICD-10-CM | POA: Diagnosis not present

## 2023-11-05 DIAGNOSIS — I77810 Thoracic aortic ectasia: Secondary | ICD-10-CM | POA: Diagnosis not present

## 2023-11-05 LAB — CMP (CANCER CENTER ONLY)
ALT: 27 U/L (ref 0–44)
AST: 25 U/L (ref 15–41)
Albumin: 4.4 g/dL (ref 3.5–5.0)
Alkaline Phosphatase: 56 U/L (ref 38–126)
Anion gap: 5 (ref 5–15)
BUN: 26 mg/dL — ABNORMAL HIGH (ref 8–23)
CO2: 27 mmol/L (ref 22–32)
Calcium: 10.1 mg/dL (ref 8.9–10.3)
Chloride: 108 mmol/L (ref 98–111)
Creatinine: 0.98 mg/dL (ref 0.61–1.24)
GFR, Estimated: >60 mL/min (ref >60)
Glucose, Bld: 141 mg/dL — ABNORMAL HIGH (ref 70–99)
Potassium: 4.4 mmol/L (ref 3.5–5.1)
Sodium: 140 mmol/L (ref 135–145)
Total Bilirubin: 0.7 mg/dL (ref 0.0–1.2)
Total Protein: 7.1 g/dL (ref 6.5–8.1)

## 2023-11-05 LAB — CBC WITH DIFFERENTIAL (CANCER CENTER ONLY)
Abs Immature Granulocytes: 0.03 10*3/uL (ref 0.00–0.07)
Basophils Absolute: 0 10*3/uL (ref 0.0–0.1)
Basophils Relative: 0 %
Eosinophils Absolute: 0.2 10*3/uL (ref 0.0–0.5)
Eosinophils Relative: 3 %
HCT: 44.2 % (ref 39.0–52.0)
Hemoglobin: 14.8 g/dL (ref 13.0–17.0)
Immature Granulocytes: 1 %
Lymphocytes Relative: 22 %
Lymphs Abs: 1.5 10*3/uL (ref 0.7–4.0)
MCH: 31.3 pg (ref 26.0–34.0)
MCHC: 33.5 g/dL (ref 30.0–36.0)
MCV: 93.4 fL (ref 80.0–100.0)
Monocytes Absolute: 0.6 10*3/uL (ref 0.1–1.0)
Monocytes Relative: 9 %
Neutro Abs: 4.2 10*3/uL (ref 1.7–7.7)
Neutrophils Relative %: 65 %
Platelet Count: 205 10*3/uL (ref 150–400)
RBC: 4.73 MIL/uL (ref 4.22–5.81)
RDW: 13.1 % (ref 11.5–15.5)
WBC Count: 6.5 10*3/uL (ref 4.0–10.5)
nRBC: 0 % (ref 0.0–0.2)

## 2023-11-05 MED ORDER — IOHEXOL 300 MG/ML  SOLN
75.0000 mL | Freq: Once | INTRAMUSCULAR | Status: AC | PRN
  Administered 2023-11-05: 75 mL via INTRAVENOUS

## 2023-11-07 ENCOUNTER — Inpatient Hospital Stay: Payer: Self-pay | Attending: Internal Medicine | Admitting: Internal Medicine

## 2023-11-07 VITALS — BP 127/74 | HR 57 | Temp 97.5°F | Resp 18 | Ht 68.0 in | Wt 228.4 lb

## 2023-11-07 DIAGNOSIS — Z7984 Long term (current) use of oral hypoglycemic drugs: Secondary | ICD-10-CM | POA: Insufficient documentation

## 2023-11-07 DIAGNOSIS — Z7982 Long term (current) use of aspirin: Secondary | ICD-10-CM | POA: Insufficient documentation

## 2023-11-07 DIAGNOSIS — R001 Bradycardia, unspecified: Secondary | ICD-10-CM | POA: Insufficient documentation

## 2023-11-07 DIAGNOSIS — Z85118 Personal history of other malignant neoplasm of bronchus and lung: Secondary | ICD-10-CM | POA: Diagnosis not present

## 2023-11-07 DIAGNOSIS — C3490 Malignant neoplasm of unspecified part of unspecified bronchus or lung: Secondary | ICD-10-CM

## 2023-11-07 DIAGNOSIS — Z79899 Other long term (current) drug therapy: Secondary | ICD-10-CM | POA: Diagnosis not present

## 2023-11-07 DIAGNOSIS — E1165 Type 2 diabetes mellitus with hyperglycemia: Secondary | ICD-10-CM | POA: Diagnosis not present

## 2023-11-07 NOTE — Progress Notes (Signed)
 [facility] Telephone:([phone]) [phone]   Fax:([phone]  OFFICE PROGRESS NOTE  Aliene Beams, MD [phone] [place] [address]  DIAGNOSIS: Stage IA (T1c, N0, M0) non-small cell lung cancer, adenocarcinoma presented with right upper lobe lung nodule diagnosed in December 2022.   PRIOR THERAPY: status post right upper lobe posterior segmentectomy with lymph node dissection under the care of Dr. Dorris Fetch on August 05, 2021.  CURRENT THERAPY: Observation  INTERVAL HISTORY: Perry [name]. 78 y.o. male returns to the clinic today for 42-month follow-up visit.Discussed the use of AI scribe software for clinical note transcription with the patient, who gave verbal consent to proceed.  History of Present Illness   Perry [name]. is a 78 year old male with stage one A non-small cell lung cancer adenocarcinoma who presents for evaluation and repeat CT scan for restaging of his disease.  He was diagnosed with stage one A non-small cell lung cancer adenocarcinoma in December 2022 and underwent a right upper lobe posterior segmentectomy with lymph node dissection. He has been on observation since then. No new complaints or symptoms related to his lung cancer.  He mentions a recent scan of his chest, which he states looked good with no concerning findings. He also discusses an issue with his ascending aorta, which remains stable at 4.2 cm, similar to previous measurements.  He has switched his general practitioner to Dr. Tracie Harrier, who recently conducted blood glucose testing. His fasting blood glucose was 140 mg/dL. He is currently taking metoprolol XL, which has been increased, and he acknowledges that it may contribute to his low heart rate, which he reports as being around 51 beats per minute in the mornings.  No nausea, vomiting, or diarrhea. He mentions that his pulse rate varies, being around 50 to 57 beats per minute, and attributes this  to his medication and possibly his past athleticism.        MEDICAL HISTORY: Past Medical History:  Diagnosis Date   A-fib (HCC) 04/07/2014   Abscess of abdominal cavity (HCC) 05/04/2014   Acute appendicitis with perforation and peritoneal abscess 05/04/2014   Allergy    Anginal pain (HCC)    CAD (coronary artery disease)    CAD (coronary artery disease) of artery bypass graft 05/04/2014   Cancer (HCC) 07/2021   lung   CHF (congestive heart failure) (HCC)    pt not aware   Diabetes mellitus    not on medication - pt not aware of this   Dyslipidemia 05/04/2014   Dysrhythmia    Hearing loss    mild   Hernia, inguinal    right   Hyperglycemia    Hypertension    Ileus, postoperative (HCC) 05/04/2014   Lumbar disc disease    Old inferior wall myocardial infarction 08/07/2002    ALLERGIES:  has no known allergies.  MEDICATIONS:  Current Outpatient Medications  Medication Sig Dispense Refill   aspirin (HM ASPIRIN) 81 MG chewable tablet Chew 1 tablet (81 mg total) by mouth daily. (Patient taking differently: Chew 81 mg by mouth daily. EC) 90 tablet 3   fenofibrate (TRICOR) 145 MG tablet TAKE 1 TABLET BY MOUTH DAILY 90 tablet 2   fish oil-omega-3 fatty acids 1000 MG capsule Take 2 g by mouth daily.     INVOKANA 100 MG TABS tablet TAKE 1 TABLET EVERY DAY BEFORE BREAKFAST 90 tablet 3   lisinopril (ZESTRIL) 40 MG tablet TAKE 1 TABLET BY  MOUTH EVERY DAY 90 tablet 2   metFORMIN (GLUCOPHAGE-XR) 500 MG 24 hr tablet TAKE 2 TABLETS BY MOUTH EVERY MORNING 180 tablet 2   metoprolol succinate (TOPROL-XL) 50 MG 24 hr tablet Take 1 tablet (50 mg total) by mouth daily. 30 tablet 0   Multiple Vitamin (MULTIVITAMIN ADULT PO) Take 1 tablet by mouth daily.     nitroGLYCERIN (NITROSTAT) 0.4 MG SL tablet Place 1 tablet (0.4 mg total) under the tongue every 5 (five) minutes as needed for chest pain. 25 tablet 5   rosuvastatin (CRESTOR) 10 MG tablet Take 1 tablet (10 mg total) by mouth daily. 30  tablet 0   Suvorexant (BELSOMRA) 15 MG TABS Take 1 tablet (15 mg total) by mouth at bedtime as needed. 30 tablet 3   Suvorexant (BELSOMRA) 15 MG TABS Take 1 tablet (15 mg total) by mouth at bedtime as needed. 90 tablet 1   Suvorexant (BELSOMRA) 15 MG TABS TAKE 1 TABLET AT BEDTIME AS NEEDED 90 tablet 0   No current facility-administered medications for this visit.    SURGICAL HISTORY:  Past Surgical History:  Procedure Laterality Date   COLONOSCOPY     CORONARY ANGIOPLASTY     multiple   CORONARY ARTERY BYPASS GRAFT  2004   LIMA to LAD and SVG to RCA (emergent)   INTERCOSTAL NERVE BLOCK Right 08/05/2021   Procedure: INTERCOSTAL NERVE BLOCK;  Surgeon: Loreli Slot, MD;  Location: [facility] OR;  Service: Thoracic;  Laterality: Right;   LAPAROSCOPIC APPENDECTOMY N/A 04/21/2014   Procedure: APPENDECTOMY LAPAROSCOPIC;  Surgeon: Emelia Loron, MD;  Location: [facility] OR;  Service: General;  Laterality: N/A;   LAPAROSCOPIC APPENDECTOMY N/A 06/24/2014   Procedure: APPENDECTOMY LAPAROSCOPIC ;  Surgeon: Emelia Loron, MD;  Location: [facility] OR;  Service: General;  Laterality: N/A;   LUMBAR LAMINECTOMY  2001   lumbar   LYMPH NODE DISSECTION Right 08/05/2021   Procedure: LYMPH NODE DISSECTION;  Surgeon: Loreli Slot, MD;  Location: [facility] OR;  Service: Thoracic;  Laterality: Right;   TONSILLECTOMY     XI ROBOTIC ASSISTED THORACOSCOPY- SEGMENTECTOMY Right 08/05/2021   Procedure: XI ROBOTIC ASSISTED THORACOSCOPY- RIGHT UPPER LOBE POSTERIOR SEGMENTECTOMY, possible lobectomy;  Surgeon: Loreli Slot, MD;  Location: [facility] OR;  Service: Thoracic;  Laterality: Right;    REVIEW OF SYSTEMS:  A comprehensive review of systems was negative.   PHYSICAL EXAMINATION: General appearance: alert, cooperative, fatigued, and no distress Head: Normocephalic, without obvious abnormality, atraumatic Neck: no adenopathy, no JVD, supple, symmetrical, trachea midline, and thyroid not enlarged, symmetric, no  tenderness/mass/nodules Lymph nodes: Cervical, supraclavicular, and axillary nodes normal. Resp: clear to auscultation bilaterally Back: symmetric, no curvature. ROM normal. No CVA tenderness. Cardio: regular rate and rhythm, S1, S2 normal, no murmur, click, rub or gallop GI: soft, non-tender; bowel sounds normal; no masses,  no organomegaly Extremities: extremities normal, atraumatic, no cyanosis or edema  ECOG PERFORMANCE STATUS: 1 - Symptomatic but completely ambulatory  Blood pressure 127/74, pulse (!) 57, temperature (!) 97.5 F (36.4 C), temperature source Temporal, resp. rate 18, height 5\' 8"  (1.727 m), weight 228 lb 6.4 oz (103.6 kg), SpO2 99%.  LABORATORY DATA: Lab Results  Component Value Date   WBC 6.5 11/05/2023   HGB 14.8 11/05/2023   HCT 44.2 11/05/2023   MCV 93.4 11/05/2023   PLT 205 11/05/2023      Chemistry      Component Value Date/Time   NA 140 11/05/2023 0911   NA 139 08/20/2020 0950   K  4.4 11/05/2023 0911   CL 108 11/05/2023 0911   CO2 27 11/05/2023 0911   BUN 26 (H) 11/05/2023 0911   BUN 36 (H) 08/20/2020 0950   CREATININE 0.98 11/05/2023 0911   CREATININE 1.23 (H) 04/21/2020 0843   GLU 133 05/07/2017 0000      Component Value Date/Time   CALCIUM 10.1 11/05/2023 0911   ALKPHOS 56 11/05/2023 0911   AST 25 11/05/2023 0911   ALT 27 11/05/2023 0911   BILITOT 0.7 11/05/2023 0911       RADIOGRAPHIC STUDIES: CT Chest W Contrast Result Date: 11/06/2023 CLINICAL DATA:  Staging non-small-cell lung cancer. * Tracking Code: BO * EXAM: CT CHEST WITH CONTRAST TECHNIQUE: Multidetector CT imaging of the chest was performed during intravenous contrast administration. RADIATION DOSE REDUCTION: This exam was performed according to the departmental dose-optimization program which includes automated exposure control, adjustment of the mA and/or kV according to patient size and/or use of iterative reconstruction technique. CONTRAST:  75mL OMNIPAQUE IOHEXOL 300 MG/ML   SOLN COMPARISON:  MRA chest 04/23/2023.  Chest CT 11/07/2022 and older. FINDINGS: Cardiovascular: Status post median sternotomy. Heart is nonenlarged. No pericardial effusion. Coronary artery calcifications are seen. The thoracic aorta is again mildly ectatic with mild atherosclerotic plaque. Mediastinum/Nodes: Slightly patulous thoracic esophagus. Small thyroid gland. No specific abnormal lymph node enlargement identified in the axillary region, hilum or mediastinum. Lungs/Pleura: Slight breathing motion. Surgical changes from prior right upper lobe posterior resection. Associated pleural thickening, scarring and fibrotic changes. There is some dependent atelectasis seen at the right lung base greater than left, similar to previous. Minimal areas of interstitial septal thickening scattered in both lungs. No developing new mass lesion or pleural effusion. No consolidation or pneumothorax. Tiny presumed subpleural node along the left major fissure is again seen and stable, series 5, image 84. Upper Abdomen: Adrenal glands are preserved. Fatty liver infiltration. Stones in the nondilated gallbladder. Right-sided benign-appearing renal cyst again identified. The entirety of the lesion is not included imaging field as on prior. Musculoskeletal: Curvature and degenerative changes along the spine. IMPRESSION: No significant interval change. Stable surgical changes in the right upper lung with pleural thickening and scarring. Scattered areas of ground-glass interstitial septal thickening bilaterally, unchanged from previous. Postop chest with ectatic ascending aorta measuring up to 4.2 cm, similar to previous. Recommend annual imaging followup by CTA or MRA. This recommendation follows 2010 ACCF/AHA/AATS/ACR/ASA/SCA/SCAI/SIR/STS/SVM Guidelines for the Diagnosis and Management of Patients with Thoracic Aortic Disease. Circulation. 2010; 121: W295-A213. Aortic aneurysm NOS (ICD10-I71.9) Gallstones.  Fatty liver infiltration.  Aortic Atherosclerosis (ICD10-I70.0) and Emphysema (ICD10-J43.9). Electronically Signed   By: Karen Kays M.D.   On: 11/06/2023 18:50    ASSESSMENT AND PLAN: This is a very pleasant 78 years old white male with Stage IA (T1c, N0, M0) non-small cell lung cancer, adenocarcinoma presented with right upper lobe lung nodule diagnosed in December 2022 status post right upper lobe posterior segmentectomy with lymph node dissection under the care of Dr. Dorris Fetch on August 05, 2021. He is currently on observation and feeling fine with no concerning complaints. He had repeat CT scan of the chest performed recently.  I personally and independently reviewed the scan and discussed the result with the patient today.  His scan showed no concerning findings for disease recurrence or metastasis.Assessment and Plan    Stage IA non-small cell lung cancer (adenocarcinoma) Stage IA non-small cell lung cancer (adenocarcinoma) diagnosed in December 2022. Status post right upper lobe posterior segmentectomy with lymph node dissection. Recent  CT scan shows no concerning findings, indicating stable disease. - Continue annual follow-up visits and CT scans for lung cancer surveillance  Ectatic ascending aorta Ectatic ascending aorta measuring 4.2 cm, consistent with previous measurements. No new symptoms reported. Monitoring will continue with annual CT scans, serving dual purpose with lung cancer surveillance. - Continue annual CT scans to assess aorta size and stability  Bradycardia Bradycardia with morning pulse rates as low as 51 bpm, likely related to metoprolol use and athletic activity. No symptoms of concern reported. - Monitor heart rate and symptoms, particularly in relation to metoprolol use  Elevated blood glucose Recent blood glucose levels at 119 mg/dL and 161 mg/dL. Management, including potential A1c testing, is under the care of primary care physician. - Defer management to primary care physician for  further evaluation and management of blood glucose levels     He was advised to call immediately if he has any concerning symptoms in the interval.  The patient voices understanding of current disease status and treatment options and is in agreement with the current care plan.  All questions were answered. The patient knows to call the clinic with any problems, questions or concerns. We can certainly see the patient much sooner if necessary.  The total time spent in the appointment was 20 minutes.  Disclaimer: This note was dictated with voice recognition software. Similar sounding words can inadvertently be transcribed and may not be corrected upon review.

## 2023-11-12 ENCOUNTER — Telehealth: Payer: Self-pay | Admitting: Cardiology

## 2023-11-12 NOTE — Telephone Encounter (Signed)
 STAT if HR is under 50 or over 120 (normal HR is 60-100 beats per minute)  What is your heart rate? 51  Do you have a log of your heart rate readings (document readings)? Heart rate has been in 50's mostly. Was told by [facility] to reach out to our office.  Do you have any other symptoms? No   Patient has info to send through [place] from [place], [place] not sure how he can get that sent over via MyChart. States Deboraha Sprang will not send to Dr. Anne Fu

## 2023-11-12 NOTE — Telephone Encounter (Signed)
 Patient reports his new PCP at [facility] recommended a BP/HR monitoring service through [employer].   He states the company called him to notify him his HR was in the 50's twice over the weekend. Patient denies any CP, new SOB, fatigue, dizziness or palpitations.  He is trying to get report sent to our office for Dr. Anne Fu to review. He will be seeing new PCP Dr. Tracie Harrier tomorrow and will request report be faxed to our office.  Also informed patient he could scan, upload and attach report to MyChart message to send over. Patient states he will have his son assist with this or will just ask provider to send when he sees her tomorrow.  Will forward to Dr. Anne Fu' nurse for follow-up.

## 2023-11-20 ENCOUNTER — Ambulatory Visit: Admitting: Thoracic Surgery (Cardiothoracic Vascular Surgery)

## 2023-11-20 ENCOUNTER — Encounter: Payer: Self-pay | Admitting: Thoracic Surgery (Cardiothoracic Vascular Surgery)

## 2023-11-20 VITALS — BP 145/77 | HR 59 | Resp 20 | Ht 68.0 in | Wt 229.0 lb

## 2023-11-20 DIAGNOSIS — Z902 Acquired absence of lung [part of]: Secondary | ICD-10-CM

## 2023-11-20 DIAGNOSIS — I712 Thoracic aortic aneurysm, without rupture, unspecified: Secondary | ICD-10-CM | POA: Diagnosis not present

## 2023-11-20 NOTE — Progress Notes (Signed)
 [address]       [place] [address]             [phone]     HPI: Mr. Kunath returns for follow-up of ascending aneurysm.  Ojas Coone is a 78 year old man with a history of tobacco abuse (quit 1980), CAD, MI, CABG, ascending aneurysm, hypertension, hyperlipidemia, sleep apnea, paroxysmal atrial fibrillation, glucose intolerance, and stage Ia adenocarcinoma of the lung.  He had emergency bypass surgery by Dr. Nydia Bouton in 2004.  I did a right upper lobe posterior segmentectomy in December 2022 for a stage Ia adenocarcinoma.  He developed a cough after his surgery which was really bothersome for a couple of months.  That did not resolve entirely but has improved.    He recently saw Dr. Shirline Frees.  He denies any chest pain, pressure, tightness, or shortness of breath.  Also denies weight loss, loss of appetite, headaches or visual changes.  Past Medical History:  Diagnosis Date   A-fib (HCC) 04/07/2014   Abscess of abdominal cavity (HCC) 05/04/2014   Acute appendicitis with perforation and peritoneal abscess 05/04/2014   Allergy    Anginal pain (HCC)    CAD (coronary artery disease)    CAD (coronary artery disease) of artery bypass graft 05/04/2014   Cancer (HCC) 07/2021   lung   CHF (congestive heart failure) (HCC)    pt not aware   Diabetes mellitus    not on medication - pt not aware of this   Dyslipidemia 05/04/2014   Dysrhythmia    Hearing loss    mild   Hernia, inguinal    right   Hyperglycemia    Hypertension    Ileus, postoperative (HCC) 05/04/2014   Lumbar disc disease    Old inferior wall myocardial infarction 08/07/2002    Current Outpatient Medications  Medication Sig Dispense Refill   aspirin (HM ASPIRIN) 81 MG chewable tablet Chew 1 tablet (81 mg total) by mouth daily. (Patient taking differently: Chew 81 mg by mouth daily. EC) 90 tablet 3   fenofibrate (TRICOR) 145 MG tablet TAKE 1 TABLET BY MOUTH DAILY 90 tablet 2   fish  oil-omega-3 fatty acids 1000 MG capsule Take 2 g by mouth daily.     INVOKANA 100 MG TABS tablet TAKE 1 TABLET EVERY DAY BEFORE BREAKFAST 90 tablet 3   JARDIANCE 25 MG TABS tablet Take 25 mg by mouth daily.     lisinopril (ZESTRIL) 40 MG tablet TAKE 1 TABLET BY MOUTH EVERY DAY 90 tablet 2   metFORMIN (GLUCOPHAGE-XR) 500 MG 24 hr tablet TAKE 2 TABLETS BY MOUTH EVERY MORNING (Patient taking differently: Take 1,000 mg by mouth 2 (two) times daily at 8 am and 10 pm.) 180 tablet 2   metoprolol succinate (TOPROL-XL) 50 MG 24 hr tablet Take 1 tablet (50 mg total) by mouth daily. 30 tablet 0   Multiple Vitamin (MULTIVITAMIN ADULT PO) Take 1 tablet by mouth daily.     nitroGLYCERIN (NITROSTAT) 0.4 MG SL tablet Place 1 tablet (0.4 mg total) under the tongue every 5 (five) minutes as needed for chest pain. 25 tablet 5   rosuvastatin (CRESTOR) 10 MG tablet Take 1 tablet (10 mg total) by mouth daily. 30 tablet 0   Suvorexant (BELSOMRA) 15 MG TABS Take 1 tablet (15 mg total) by mouth at bedtime as needed. 30 tablet 3   Suvorexant (BELSOMRA) 15 MG TABS TAKE 1 TABLET AT BEDTIME AS NEEDED 90 tablet 0  No current facility-administered medications for this visit.    Physical Exam BP (!) 145/77 (BP Location: Left Arm, Patient Position: Sitting, Cuff Size: Normal)   Pulse (!) 59   Resp 20   Ht 5\' 8"  (1.727 m)   Wt 229 lb (103.9 kg)   SpO2 97% Comment: RA  BMI 34.75 kg/m  78 year old man in no acute distress Alert and oriented x 3 with no focal deficits Lungs clear with equal breath sounds bilaterally Cardiac regular rate and rhythm with normal S1 and S2 No cervical or supraclavicular adenopathy No carotid bruits  Diagnostic Tests: CT CHEST WITH CONTRAST   TECHNIQUE: Multidetector CT imaging of the chest was performed during intravenous contrast administration.   RADIATION DOSE REDUCTION: This exam was performed according to the departmental dose-optimization program which includes  automated exposure control, adjustment of the mA and/or kV according to patient size and/or use of iterative reconstruction technique.   CONTRAST:  75mL OMNIPAQUE IOHEXOL 300 MG/ML  SOLN   COMPARISON:  MRA chest 04/23/2023.  Chest CT 11/07/2022 and older.   FINDINGS: Cardiovascular: Status post median sternotomy. Heart is nonenlarged. No pericardial effusion. Coronary artery calcifications are seen. The thoracic aorta is again mildly ectatic with mild atherosclerotic plaque.   Mediastinum/Nodes: Slightly patulous thoracic esophagus. Small thyroid gland. No specific abnormal lymph node enlargement identified in the axillary region, hilum or mediastinum.   Lungs/Pleura: Slight breathing motion. Surgical changes from prior right upper lobe posterior resection. Associated pleural thickening, scarring and fibrotic changes. There is some dependent atelectasis seen at the right lung base greater than left, similar to previous. Minimal areas of interstitial septal thickening scattered in both lungs. No developing new mass lesion or pleural effusion. No consolidation or pneumothorax. Tiny presumed subpleural node along the left major fissure is again seen and stable, series 5, image 84.   Upper Abdomen: Adrenal glands are preserved. Fatty liver infiltration. Stones in the nondilated gallbladder. Right-sided benign-appearing renal cyst again identified. The entirety of the lesion is not included imaging field as on prior.   Musculoskeletal: Curvature and degenerative changes along the spine.   IMPRESSION: No significant interval change.   Stable surgical changes in the right upper lung with pleural thickening and scarring.   Scattered areas of ground-glass interstitial septal thickening bilaterally, unchanged from previous.   Postop chest with ectatic ascending aorta measuring up to 4.2 cm, similar to previous. Recommend annual imaging followup by CTA or MRA. This recommendation  follows 2010 ACCF/AHA/AATS/ACR/ASA/SCA/SCAI/SIR/STS/SVM Guidelines for the Diagnosis and Management of Patients with Thoracic Aortic Disease. Circulation. 2010; 121: N562-Z308. Aortic aneurysm NOS (ICD10-I71.9)   Gallstones.  Fatty liver infiltration.   Aortic Atherosclerosis (ICD10-I70.0) and Emphysema (ICD10-J43.9).     Electronically Signed   By: Karen Kays M.D.   On: 11/06/2023 18:50   I personally reviewed the CT images.  Stable 4.2 cm ascending aneurysm.  Thoracic aortic and coronary atherosclerotic disease.  Postop changes from CABG and right upper lobe segmentectomy.  No mediastinal or hilar adenopathy.  Impression: Perry [name] is a 78 year old man with a history of tobacco abuse (quit 1980), CAD, MI, CABG, ascending aneurysm, hypertension, hyperlipidemia, sleep apnea, paroxysmal atrial fibrillation, glucose intolerance, and stage Ia adenocarcinoma of the lung.  Stage Ia adenocarcinoma of the lung-now about 2-1/2 years out from surgery.  No evidence of recurrent disease.  Ascending aneurysm-stable at 4.1 to 4.2 cm.  Needs continued annual follow-up.  CAD-emergency CABG in 2004.  No chest pain, pressure, tightness, or exertional dyspnea.  Plan:  Return in 1 year after CT chest  I spent over 20 minutes in review of records, images, and in consultation with Mr. Hoogland today  Zelphia Higashi, MD [facility] [phone]

## 2023-11-28 DIAGNOSIS — K080 Exfoliation of teeth due to systemic causes: Secondary | ICD-10-CM | POA: Diagnosis not present

## 2023-11-29 DIAGNOSIS — E1169 Type 2 diabetes mellitus with other specified complication: Secondary | ICD-10-CM | POA: Diagnosis not present

## 2023-11-29 DIAGNOSIS — I2510 Atherosclerotic heart disease of native coronary artery without angina pectoris: Secondary | ICD-10-CM | POA: Diagnosis not present

## 2023-11-29 DIAGNOSIS — I10 Essential (primary) hypertension: Secondary | ICD-10-CM | POA: Diagnosis not present

## 2023-12-03 DIAGNOSIS — K080 Exfoliation of teeth due to systemic causes: Secondary | ICD-10-CM | POA: Diagnosis not present

## 2023-12-05 ENCOUNTER — Other Ambulatory Visit: Payer: Self-pay | Admitting: Family Medicine

## 2023-12-05 DIAGNOSIS — E782 Mixed hyperlipidemia: Secondary | ICD-10-CM

## 2023-12-05 DIAGNOSIS — I25700 Atherosclerosis of coronary artery bypass graft(s), unspecified, with unstable angina pectoris: Secondary | ICD-10-CM

## 2023-12-05 DIAGNOSIS — I10 Essential (primary) hypertension: Secondary | ICD-10-CM | POA: Diagnosis not present

## 2023-12-05 DIAGNOSIS — E1169 Type 2 diabetes mellitus with other specified complication: Secondary | ICD-10-CM | POA: Diagnosis not present

## 2023-12-05 DIAGNOSIS — I2510 Atherosclerotic heart disease of native coronary artery without angina pectoris: Secondary | ICD-10-CM | POA: Diagnosis not present

## 2023-12-12 NOTE — Telephone Encounter (Signed)
 This office has not received any monitor results from pt's PCP.  Will close this encounter and await call back with any further questions or concerns.

## 2023-12-25 DIAGNOSIS — R053 Chronic cough: Secondary | ICD-10-CM | POA: Diagnosis not present

## 2023-12-25 DIAGNOSIS — T464X5A Adverse effect of angiotensin-converting-enzyme inhibitors, initial encounter: Secondary | ICD-10-CM | POA: Diagnosis not present

## 2023-12-29 DIAGNOSIS — E1169 Type 2 diabetes mellitus with other specified complication: Secondary | ICD-10-CM | POA: Diagnosis not present

## 2023-12-29 DIAGNOSIS — I2510 Atherosclerotic heart disease of native coronary artery without angina pectoris: Secondary | ICD-10-CM | POA: Diagnosis not present

## 2023-12-29 DIAGNOSIS — I10 Essential (primary) hypertension: Secondary | ICD-10-CM | POA: Diagnosis not present

## 2024-01-05 DIAGNOSIS — I2510 Atherosclerotic heart disease of native coronary artery without angina pectoris: Secondary | ICD-10-CM | POA: Diagnosis not present

## 2024-01-05 DIAGNOSIS — E1169 Type 2 diabetes mellitus with other specified complication: Secondary | ICD-10-CM | POA: Diagnosis not present

## 2024-01-05 DIAGNOSIS — I10 Essential (primary) hypertension: Secondary | ICD-10-CM | POA: Diagnosis not present

## 2024-01-07 DIAGNOSIS — I10 Essential (primary) hypertension: Secondary | ICD-10-CM | POA: Diagnosis not present

## 2024-01-07 DIAGNOSIS — Z85118 Personal history of other malignant neoplasm of bronchus and lung: Secondary | ICD-10-CM | POA: Diagnosis not present

## 2024-01-28 DIAGNOSIS — I2510 Atherosclerotic heart disease of native coronary artery without angina pectoris: Secondary | ICD-10-CM | POA: Diagnosis not present

## 2024-01-28 DIAGNOSIS — E1169 Type 2 diabetes mellitus with other specified complication: Secondary | ICD-10-CM | POA: Diagnosis not present

## 2024-01-28 DIAGNOSIS — I10 Essential (primary) hypertension: Secondary | ICD-10-CM | POA: Diagnosis not present

## 2024-02-06 ENCOUNTER — Other Ambulatory Visit: Payer: Self-pay | Admitting: Family Medicine

## 2024-02-06 ENCOUNTER — Telehealth: Payer: Self-pay | Admitting: Cardiology

## 2024-02-06 DIAGNOSIS — I10 Essential (primary) hypertension: Secondary | ICD-10-CM

## 2024-02-06 DIAGNOSIS — I25700 Atherosclerosis of coronary artery bypass graft(s), unspecified, with unstable angina pectoris: Secondary | ICD-10-CM

## 2024-02-06 NOTE — Telephone Encounter (Signed)
 Pt would like ac/b to discuss the changes Dr. Rolinda made. Please advise

## 2024-02-06 NOTE — Telephone Encounter (Signed)
 I spoke with patient. He reports Dr Rolinda made some recent medication changes and he would like to go over his medications.  He does not have list with him today but will have tomorrow.  He will call back tomorrow.

## 2024-02-07 NOTE — Telephone Encounter (Signed)
 Pt returning call with med list, requesting cb

## 2024-02-07 NOTE — Telephone Encounter (Signed)
 PCP have changed some of his medications:  - losartan 50 mg, stopped Lisinopril  several months ago  - increased Metformin  taking 1000 mg BID  - Toprol  25 mg daily Spent about 20 minutes on the phone with the patient.  Patient wasn't quite sure of all the medication changes.  When reviewing some of the medications, I explained to the patient that we were showing he was already on that dose as of last year.  Patient apologized and stated he honestly wasn't sure. Patient was not confused but just not sure.  Advised pt to have PCP send in the last 1 or 2 OVs (he established with a new one earlier this year - Dr. Rolinda. Formerly Dr. Watt). Patient will ask their office to send in notes so that we may review and clarify changes and why those changes were made. Patient verbalized understanding and agreeable to plan.

## 2024-02-18 NOTE — Progress Notes (Signed)
 Cardiology Office Note:  .   Date:  03/03/2024  ID:  Perry [name]., DOB [birth year], MRN [id number] PCP: Rolinda Millman, MD  [facility] Providers Cardiologist:  Oneil Parchment, MD Cardiology APP:  [name] CHRISTELLA, PA-C    History of Present Illness: .   Perry [name]. is a 78 y.o. male   with a hx of asthma, hyperlipidemia, PAF, coronary artery disease, status post coronary artery bypass graft 2004, thoracic aortic aneurysm, hypertension, diabetes mellitus type II, obstructive sleep apnea, and dyslipidemia.  Patient comes in for f/u. PCP decreased  toprol  for bradycardia. Lisinopril  stopped due to cough. Losartan 50 mg once daily. He brings readings from home and BP and Heart rate stable. HR 53-71. BP 114/65-132/73. He is sweating like crazy and wondering if it's from jardiance. He is working outside a lot getting [place] for [place] outdoor [place].     ROS:    Studies Reviewed: SABRA    EKG Interpretation Date/Time:  Monday March 03 2024 11:50:10 EDT Ventricular Rate:  56 PR Interval:  260 QRS Duration:  112 QT Interval:  452 QTC Calculation: 436 R Axis:   -41  Text Interpretation: Sinus bradycardia with 1st degree A-V block Left axis deviation Inferior infarct (cited on or before 17-Apr-2017) When compared with ECG of 21-May-2023 07:48, Nonspecific T wave abnormality, improved in Lateral leads Confirmed by [name] [phone]) on 03/03/2024 12:11:40 PM    Prior CV Studies:   Echo 05/2022 IMPRESSIONS     1. Left ventricular ejection fraction by 3D volume is 50 %. The left  ventricle has low normal function. The left ventricle has no regional wall  motion abnormalities. Left ventricular diastolic parameters are consistent  with Grade I diastolic dysfunction   (impaired relaxation).   2. Right ventricular systolic function is normal. The right ventricular  size is moderately enlarged.   3. Right atrial size was mildly dilated.   4. The  mitral valve is normal in structure. No evidence of mitral valve  regurgitation. No evidence of mitral stenosis.   5. The aortic valve is tricuspid. Aortic valve regurgitation is mild. No  aortic stenosis is present.   6. Aortic dilatation noted. There is moderate dilatation of the ascending  aorta, measuring 46 mm.   7. The inferior vena cava is normal in size with greater than 50%  respiratory variability, suggesting right atrial pressure of 3 mmHg.    CT chest 11/2022 MPRESSION: 1. No evidence of recurrent or metastatic disease. 2. Mild subpleural coarsened ground-glass and subpleural reticular densities without a definite zonal predominance. Findings may be due to nonspecific interstitial pneumonitis. 3. 4.2 cm ascending aortic aneurysm. Recommend annual imaging followup by CTA or MRA. This recommendation follows 2010 ACCF/AHA/AATS/ACR/ASA/SCA/SCAI/SIR/STS/SVM Guidelines for the Diagnosis and Management of Patients with Thoracic Aortic Disease. Circulation. 2010; 121: Z733-z630. Aortic aneurysm NOS (ICD10-I71.9). 4. Hepatic steatosis. 5. Cholelithiasis. 6.  Aortic atherosclerosis (ICD10-I70.0). 7.  Emphysema (ICD10-J43.9).    Risk Assessment/Calculations:             Physical Exam:   VS:  BP 130/70   Pulse (!) 56   Ht 5' 8 (1.727 m)   Wt 235 lb (106.6 kg)   SpO2 97%   BMI 35.73 kg/m    Orhtostatics: No data found. Wt Readings from Last 3 Encounters:  03/03/24 235 lb (106.6 kg)  11/20/23 229 lb (103.9 kg)  11/07/23 228 lb 6.4 oz (103.6 kg)  GEN: Well nourished, well developed in no acute distress NECK: No JVD; No carotid bruits CARDIAC:  RRR, no murmurs, rubs, gallops RESPIRATORY:  Clear to auscultation without rales, wheezing or rhonchi  ABDOMEN: Soft, non-tender, non-distended EXTREMITIES:  No edema; No deformity   ASSESSMENT AND PLAN: .   Ascending aortic aneurysm 4.2 cm on chest CT 10/2023. Has yearly CT by Dr. Sherrod.   CAD CABG 2004. No angina,  continue ASA and metoprolol  and lisinopril , crestor    DOE chronic but has improved over the years.   PAF during appendix surgery and no recurrence.   HTN controlled-lisinopril  stopped for cough and now tolerating losartan. Metoprolol  decreased b/c of bradycardia. Now stable.   OSA doesn't tolerate Cpap    DM2 -A1C 7.7 08/2023. He's working on his diet and trying to lose weight. Also on jardiance   Carotid stenosis dopplers stable 1-39% 04/2023        Dispo: f/u Dr. Jeffrie 6 months  Signed, Olivia Pavy, PA-C

## 2024-02-27 DIAGNOSIS — I2510 Atherosclerotic heart disease of native coronary artery without angina pectoris: Secondary | ICD-10-CM | POA: Diagnosis not present

## 2024-02-27 DIAGNOSIS — I10 Essential (primary) hypertension: Secondary | ICD-10-CM | POA: Diagnosis not present

## 2024-02-27 DIAGNOSIS — E1169 Type 2 diabetes mellitus with other specified complication: Secondary | ICD-10-CM | POA: Diagnosis not present

## 2024-02-29 DIAGNOSIS — E1169 Type 2 diabetes mellitus with other specified complication: Secondary | ICD-10-CM | POA: Diagnosis not present

## 2024-02-29 DIAGNOSIS — I10 Essential (primary) hypertension: Secondary | ICD-10-CM | POA: Diagnosis not present

## 2024-02-29 DIAGNOSIS — I2510 Atherosclerotic heart disease of native coronary artery without angina pectoris: Secondary | ICD-10-CM | POA: Diagnosis not present

## 2024-02-29 DIAGNOSIS — R5383 Other fatigue: Secondary | ICD-10-CM | POA: Diagnosis not present

## 2024-02-29 DIAGNOSIS — F5101 Primary insomnia: Secondary | ICD-10-CM | POA: Diagnosis not present

## 2024-03-03 ENCOUNTER — Ambulatory Visit: Attending: Physician Assistant | Admitting: Physician Assistant

## 2024-03-03 ENCOUNTER — Encounter: Payer: Self-pay | Admitting: Physician Assistant

## 2024-03-03 VITALS — BP 130/70 | HR 56 | Ht 68.0 in | Wt 235.0 lb

## 2024-03-03 DIAGNOSIS — I25810 Atherosclerosis of coronary artery bypass graft(s) without angina pectoris: Secondary | ICD-10-CM

## 2024-03-03 DIAGNOSIS — G4733 Obstructive sleep apnea (adult) (pediatric): Secondary | ICD-10-CM

## 2024-03-03 DIAGNOSIS — I480 Paroxysmal atrial fibrillation: Secondary | ICD-10-CM | POA: Diagnosis not present

## 2024-03-03 DIAGNOSIS — E1159 Type 2 diabetes mellitus with other circulatory complications: Secondary | ICD-10-CM

## 2024-03-03 DIAGNOSIS — I7121 Aneurysm of the ascending aorta, without rupture: Secondary | ICD-10-CM | POA: Diagnosis not present

## 2024-03-03 DIAGNOSIS — R001 Bradycardia, unspecified: Secondary | ICD-10-CM | POA: Diagnosis not present

## 2024-03-03 NOTE — Patient Instructions (Signed)
 Medication Instructions:  Your physician recommends that you continue on your current medications as directed. Please refer to the Current Medication list given to you today.  *If you need a refill on your cardiac medications before your next appointment, please call your pharmacy*  Lab Work: NONE If you have labs (blood work) drawn today and your tests are completely normal, you will receive your results only by: MyChart Message (if you have MyChart) OR A paper copy in the mail If you have any lab test that is abnormal or we need to change your treatment, we will call you to review the results.  Testing/Procedures: NONE  Follow-Up: At [facility], you and your health needs are our priority.  As part of our continuing mission to provide you with exceptional heart care, our providers are all part of one team.  This team includes your primary Cardiologist (physician) and Advanced Practice Providers or APPs (Physician Assistants and Nurse Practitioners) who all work together to provide you with the care you need, when you need it.  Your next appointment:   6 month(s)  Provider:   Oneil Parchment, MD   Your physician recommends that you schedule a follow-up appointment in: 1 YEAR WITH MICHELE LENZE, PA-C   We recommend signing up for the patient portal called MyChart.  Sign up information is provided on this After Visit Summary.  MyChart is used to connect with patients for Virtual Visits (Telemedicine).  Patients are able to view lab/test results, encounter notes, upcoming appointments, etc.  Non-urgent messages can be sent to your provider as well.   To learn more about what you can do with MyChart, go to [email or web address].

## 2024-03-06 DIAGNOSIS — I2510 Atherosclerotic heart disease of native coronary artery without angina pectoris: Secondary | ICD-10-CM | POA: Diagnosis not present

## 2024-03-06 DIAGNOSIS — I10 Essential (primary) hypertension: Secondary | ICD-10-CM | POA: Diagnosis not present

## 2024-03-06 DIAGNOSIS — E1169 Type 2 diabetes mellitus with other specified complication: Secondary | ICD-10-CM | POA: Diagnosis not present

## 2024-03-07 ENCOUNTER — Telehealth: Payer: Self-pay | Admitting: Internal Medicine

## 2024-03-07 NOTE — Telephone Encounter (Signed)
 Called the patient back to inform him that we do not schedule CT scans. I sent the patient the phone number to schedule the scan via MyChart per the patients request.

## 2024-03-10 ENCOUNTER — Other Ambulatory Visit: Payer: Self-pay | Admitting: Family Medicine

## 2024-03-10 DIAGNOSIS — I25700 Atherosclerosis of coronary artery bypass graft(s), unspecified, with unstable angina pectoris: Secondary | ICD-10-CM

## 2024-03-10 DIAGNOSIS — I10 Essential (primary) hypertension: Secondary | ICD-10-CM

## 2024-03-24 DIAGNOSIS — H40013 Open angle with borderline findings, low risk, bilateral: Secondary | ICD-10-CM | POA: Diagnosis not present

## 2024-03-28 DIAGNOSIS — I10 Essential (primary) hypertension: Secondary | ICD-10-CM | POA: Diagnosis not present

## 2024-03-28 DIAGNOSIS — E1169 Type 2 diabetes mellitus with other specified complication: Secondary | ICD-10-CM | POA: Diagnosis not present

## 2024-03-28 DIAGNOSIS — I2510 Atherosclerotic heart disease of native coronary artery without angina pectoris: Secondary | ICD-10-CM | POA: Diagnosis not present

## 2024-04-06 DIAGNOSIS — E1169 Type 2 diabetes mellitus with other specified complication: Secondary | ICD-10-CM | POA: Diagnosis not present

## 2024-04-06 DIAGNOSIS — I10 Essential (primary) hypertension: Secondary | ICD-10-CM | POA: Diagnosis not present

## 2024-04-06 DIAGNOSIS — I2510 Atherosclerotic heart disease of native coronary artery without angina pectoris: Secondary | ICD-10-CM | POA: Diagnosis not present

## 2024-04-27 DIAGNOSIS — I2510 Atherosclerotic heart disease of native coronary artery without angina pectoris: Secondary | ICD-10-CM | POA: Diagnosis not present

## 2024-04-27 DIAGNOSIS — E1169 Type 2 diabetes mellitus with other specified complication: Secondary | ICD-10-CM | POA: Diagnosis not present

## 2024-04-27 DIAGNOSIS — I10 Essential (primary) hypertension: Secondary | ICD-10-CM | POA: Diagnosis not present

## 2024-05-06 DIAGNOSIS — I2510 Atherosclerotic heart disease of native coronary artery without angina pectoris: Secondary | ICD-10-CM | POA: Diagnosis not present

## 2024-05-06 DIAGNOSIS — I10 Essential (primary) hypertension: Secondary | ICD-10-CM | POA: Diagnosis not present

## 2024-05-06 DIAGNOSIS — E1169 Type 2 diabetes mellitus with other specified complication: Secondary | ICD-10-CM | POA: Diagnosis not present

## 2024-05-16 ENCOUNTER — Ambulatory Visit: Admitting: Cardiology

## 2024-05-22 ENCOUNTER — Other Ambulatory Visit (HOSPITAL_COMMUNITY): Payer: Self-pay

## 2024-05-27 DIAGNOSIS — I10 Essential (primary) hypertension: Secondary | ICD-10-CM | POA: Diagnosis not present

## 2024-05-27 DIAGNOSIS — I2510 Atherosclerotic heart disease of native coronary artery without angina pectoris: Secondary | ICD-10-CM | POA: Diagnosis not present

## 2024-05-27 DIAGNOSIS — E1169 Type 2 diabetes mellitus with other specified complication: Secondary | ICD-10-CM | POA: Diagnosis not present

## 2024-05-29 DIAGNOSIS — Z23 Encounter for immunization: Secondary | ICD-10-CM | POA: Diagnosis not present

## 2024-06-06 DIAGNOSIS — I2510 Atherosclerotic heart disease of native coronary artery without angina pectoris: Secondary | ICD-10-CM | POA: Diagnosis not present

## 2024-06-06 DIAGNOSIS — E1169 Type 2 diabetes mellitus with other specified complication: Secondary | ICD-10-CM | POA: Diagnosis not present

## 2024-06-06 DIAGNOSIS — I10 Essential (primary) hypertension: Secondary | ICD-10-CM | POA: Diagnosis not present

## 2024-06-12 DIAGNOSIS — K080 Exfoliation of teeth due to systemic causes: Secondary | ICD-10-CM | POA: Diagnosis not present

## 2024-06-24 DIAGNOSIS — D225 Melanocytic nevi of trunk: Secondary | ICD-10-CM | POA: Diagnosis not present

## 2024-06-24 DIAGNOSIS — D485 Neoplasm of uncertain behavior of skin: Secondary | ICD-10-CM | POA: Diagnosis not present

## 2024-06-24 DIAGNOSIS — D3617 Benign neoplasm of peripheral nerves and autonomic nervous system of trunk, unspecified: Secondary | ICD-10-CM | POA: Diagnosis not present

## 2024-06-24 DIAGNOSIS — L570 Actinic keratosis: Secondary | ICD-10-CM | POA: Diagnosis not present

## 2024-06-24 DIAGNOSIS — L820 Inflamed seborrheic keratosis: Secondary | ICD-10-CM | POA: Diagnosis not present

## 2024-06-24 DIAGNOSIS — L821 Other seborrheic keratosis: Secondary | ICD-10-CM | POA: Diagnosis not present

## 2024-06-26 DIAGNOSIS — I2510 Atherosclerotic heart disease of native coronary artery without angina pectoris: Secondary | ICD-10-CM | POA: Diagnosis not present

## 2024-06-26 DIAGNOSIS — E1169 Type 2 diabetes mellitus with other specified complication: Secondary | ICD-10-CM | POA: Diagnosis not present

## 2024-06-26 DIAGNOSIS — I10 Essential (primary) hypertension: Secondary | ICD-10-CM | POA: Diagnosis not present

## 2024-07-06 DIAGNOSIS — I10 Essential (primary) hypertension: Secondary | ICD-10-CM | POA: Diagnosis not present

## 2024-07-06 DIAGNOSIS — E1169 Type 2 diabetes mellitus with other specified complication: Secondary | ICD-10-CM | POA: Diagnosis not present

## 2024-07-06 DIAGNOSIS — I2510 Atherosclerotic heart disease of native coronary artery without angina pectoris: Secondary | ICD-10-CM | POA: Diagnosis not present

## 2024-10-27 ENCOUNTER — Other Ambulatory Visit (HOSPITAL_COMMUNITY)

## 2024-10-27 ENCOUNTER — Other Ambulatory Visit

## 2024-10-30 ENCOUNTER — Ambulatory Visit: Admitting: Internal Medicine

## 2024-10-30 ENCOUNTER — Ambulatory Visit: Admitting: Cardiology
